# Patient Record
Sex: Male | Born: 1940 | Race: Black or African American | Hispanic: No | Smoking: Never smoker
Health system: Southern US, Community
[De-identification: ages and names within clinical notes are randomized; demographics above are authoritative.]

## PROBLEM LIST (undated history)

## (undated) DIAGNOSIS — M109 Gout, unspecified: Secondary | ICD-10-CM

## (undated) DIAGNOSIS — N400 Enlarged prostate without lower urinary tract symptoms: Secondary | ICD-10-CM

## (undated) DIAGNOSIS — I428 Other cardiomyopathies: Secondary | ICD-10-CM

## (undated) DIAGNOSIS — Z803 Family history of malignant neoplasm of breast: Secondary | ICD-10-CM

## (undated) DIAGNOSIS — I480 Paroxysmal atrial fibrillation: Secondary | ICD-10-CM

## (undated) DIAGNOSIS — E785 Hyperlipidemia, unspecified: Secondary | ICD-10-CM

## (undated) DIAGNOSIS — Z8042 Family history of malignant neoplasm of prostate: Secondary | ICD-10-CM

## (undated) DIAGNOSIS — E119 Type 2 diabetes mellitus without complications: Secondary | ICD-10-CM

## (undated) DIAGNOSIS — I509 Heart failure, unspecified: Secondary | ICD-10-CM

## (undated) DIAGNOSIS — I10 Essential (primary) hypertension: Secondary | ICD-10-CM

## (undated) DIAGNOSIS — C61 Malignant neoplasm of prostate: Secondary | ICD-10-CM

## (undated) HISTORY — DX: Heart failure, unspecified: I50.9

## (undated) HISTORY — DX: Family history of malignant neoplasm of breast: Z80.3

## (undated) HISTORY — DX: Family history of malignant neoplasm of prostate: Z80.42

## (undated) HISTORY — DX: Hyperlipidemia, unspecified: E78.5

## (undated) HISTORY — DX: Benign prostatic hyperplasia without lower urinary tract symptoms: N40.0

## (undated) HISTORY — DX: Paroxysmal atrial fibrillation: I48.0

## (undated) HISTORY — DX: Gout, unspecified: M10.9

## (undated) HISTORY — DX: Other cardiomyopathies: I42.8

## (undated) HISTORY — DX: Essential (primary) hypertension: I10

## (undated) HISTORY — DX: Type 2 diabetes mellitus without complications: E11.9

---

## 2012-03-17 DIAGNOSIS — Z125 Encounter for screening for malignant neoplasm of prostate: Secondary | ICD-10-CM | POA: Diagnosis not present

## 2012-03-17 DIAGNOSIS — Z713 Dietary counseling and surveillance: Secondary | ICD-10-CM | POA: Diagnosis not present

## 2012-03-17 DIAGNOSIS — E785 Hyperlipidemia, unspecified: Secondary | ICD-10-CM | POA: Diagnosis not present

## 2012-03-17 DIAGNOSIS — I509 Heart failure, unspecified: Secondary | ICD-10-CM | POA: Diagnosis not present

## 2012-03-17 DIAGNOSIS — E119 Type 2 diabetes mellitus without complications: Secondary | ICD-10-CM | POA: Diagnosis not present

## 2012-03-17 DIAGNOSIS — I10 Essential (primary) hypertension: Secondary | ICD-10-CM | POA: Diagnosis not present

## 2012-05-23 DIAGNOSIS — I509 Heart failure, unspecified: Secondary | ICD-10-CM | POA: Diagnosis not present

## 2012-05-30 DIAGNOSIS — I509 Heart failure, unspecified: Secondary | ICD-10-CM | POA: Diagnosis not present

## 2012-06-29 HISTORY — PX: CARDIAC DEFIBRILLATOR PLACEMENT: SHX171

## 2012-07-04 DIAGNOSIS — I2510 Atherosclerotic heart disease of native coronary artery without angina pectoris: Secondary | ICD-10-CM | POA: Diagnosis not present

## 2012-07-04 DIAGNOSIS — I428 Other cardiomyopathies: Secondary | ICD-10-CM | POA: Diagnosis not present

## 2012-07-07 DIAGNOSIS — I509 Heart failure, unspecified: Secondary | ICD-10-CM | POA: Diagnosis not present

## 2012-07-07 DIAGNOSIS — Z713 Dietary counseling and surveillance: Secondary | ICD-10-CM | POA: Diagnosis not present

## 2012-07-07 DIAGNOSIS — I10 Essential (primary) hypertension: Secondary | ICD-10-CM | POA: Diagnosis not present

## 2012-07-07 DIAGNOSIS — M2550 Pain in unspecified joint: Secondary | ICD-10-CM | POA: Diagnosis not present

## 2012-07-07 DIAGNOSIS — E785 Hyperlipidemia, unspecified: Secondary | ICD-10-CM | POA: Diagnosis not present

## 2012-07-07 DIAGNOSIS — E119 Type 2 diabetes mellitus without complications: Secondary | ICD-10-CM | POA: Diagnosis not present

## 2012-07-15 DIAGNOSIS — I428 Other cardiomyopathies: Secondary | ICD-10-CM | POA: Diagnosis not present

## 2012-07-15 DIAGNOSIS — M129 Arthropathy, unspecified: Secondary | ICD-10-CM | POA: Diagnosis not present

## 2012-07-15 DIAGNOSIS — Z8249 Family history of ischemic heart disease and other diseases of the circulatory system: Secondary | ICD-10-CM | POA: Diagnosis not present

## 2012-07-15 DIAGNOSIS — I10 Essential (primary) hypertension: Secondary | ICD-10-CM | POA: Diagnosis not present

## 2012-07-15 DIAGNOSIS — I498 Other specified cardiac arrhythmias: Secondary | ICD-10-CM | POA: Diagnosis not present

## 2012-07-15 DIAGNOSIS — I2510 Atherosclerotic heart disease of native coronary artery without angina pectoris: Secondary | ICD-10-CM | POA: Diagnosis not present

## 2012-07-15 DIAGNOSIS — E7800 Pure hypercholesterolemia, unspecified: Secondary | ICD-10-CM | POA: Diagnosis not present

## 2012-07-15 DIAGNOSIS — E785 Hyperlipidemia, unspecified: Secondary | ICD-10-CM | POA: Diagnosis not present

## 2012-07-15 DIAGNOSIS — I509 Heart failure, unspecified: Secondary | ICD-10-CM | POA: Diagnosis not present

## 2012-07-15 DIAGNOSIS — Z79899 Other long term (current) drug therapy: Secondary | ICD-10-CM | POA: Diagnosis not present

## 2012-07-15 DIAGNOSIS — E119 Type 2 diabetes mellitus without complications: Secondary | ICD-10-CM | POA: Diagnosis not present

## 2012-07-15 DIAGNOSIS — I447 Left bundle-branch block, unspecified: Secondary | ICD-10-CM | POA: Diagnosis not present

## 2012-07-15 DIAGNOSIS — Z7982 Long term (current) use of aspirin: Secondary | ICD-10-CM | POA: Diagnosis not present

## 2012-07-15 DIAGNOSIS — Z833 Family history of diabetes mellitus: Secondary | ICD-10-CM | POA: Diagnosis not present

## 2012-07-16 DIAGNOSIS — I428 Other cardiomyopathies: Secondary | ICD-10-CM | POA: Diagnosis not present

## 2012-07-16 DIAGNOSIS — I447 Left bundle-branch block, unspecified: Secondary | ICD-10-CM | POA: Diagnosis not present

## 2012-07-16 DIAGNOSIS — E785 Hyperlipidemia, unspecified: Secondary | ICD-10-CM | POA: Diagnosis not present

## 2012-07-16 DIAGNOSIS — E119 Type 2 diabetes mellitus without complications: Secondary | ICD-10-CM | POA: Diagnosis not present

## 2012-07-16 DIAGNOSIS — I10 Essential (primary) hypertension: Secondary | ICD-10-CM | POA: Diagnosis not present

## 2012-07-16 DIAGNOSIS — I509 Heart failure, unspecified: Secondary | ICD-10-CM | POA: Diagnosis not present

## 2012-07-20 DIAGNOSIS — I509 Heart failure, unspecified: Secondary | ICD-10-CM | POA: Diagnosis not present

## 2012-07-27 DIAGNOSIS — I428 Other cardiomyopathies: Secondary | ICD-10-CM | POA: Diagnosis not present

## 2012-07-28 DIAGNOSIS — I428 Other cardiomyopathies: Secondary | ICD-10-CM | POA: Diagnosis not present

## 2012-07-28 DIAGNOSIS — I2510 Atherosclerotic heart disease of native coronary artery without angina pectoris: Secondary | ICD-10-CM | POA: Diagnosis not present

## 2012-07-28 DIAGNOSIS — E785 Hyperlipidemia, unspecified: Secondary | ICD-10-CM | POA: Diagnosis not present

## 2012-07-28 DIAGNOSIS — I509 Heart failure, unspecified: Secondary | ICD-10-CM | POA: Diagnosis not present

## 2012-08-04 DIAGNOSIS — I10 Essential (primary) hypertension: Secondary | ICD-10-CM | POA: Diagnosis not present

## 2012-08-04 DIAGNOSIS — Z95810 Presence of automatic (implantable) cardiac defibrillator: Secondary | ICD-10-CM | POA: Diagnosis not present

## 2012-08-04 DIAGNOSIS — I509 Heart failure, unspecified: Secondary | ICD-10-CM | POA: Diagnosis not present

## 2012-08-04 DIAGNOSIS — E785 Hyperlipidemia, unspecified: Secondary | ICD-10-CM | POA: Diagnosis not present

## 2012-08-08 DIAGNOSIS — E785 Hyperlipidemia, unspecified: Secondary | ICD-10-CM | POA: Diagnosis not present

## 2012-08-08 DIAGNOSIS — I2510 Atherosclerotic heart disease of native coronary artery without angina pectoris: Secondary | ICD-10-CM | POA: Diagnosis not present

## 2012-08-08 DIAGNOSIS — I509 Heart failure, unspecified: Secondary | ICD-10-CM | POA: Diagnosis not present

## 2012-08-08 DIAGNOSIS — I428 Other cardiomyopathies: Secondary | ICD-10-CM | POA: Diagnosis not present

## 2012-08-26 DIAGNOSIS — I428 Other cardiomyopathies: Secondary | ICD-10-CM | POA: Diagnosis not present

## 2012-09-09 DIAGNOSIS — G4730 Sleep apnea, unspecified: Secondary | ICD-10-CM | POA: Diagnosis not present

## 2012-09-09 DIAGNOSIS — I10 Essential (primary) hypertension: Secondary | ICD-10-CM | POA: Diagnosis not present

## 2012-09-09 DIAGNOSIS — E785 Hyperlipidemia, unspecified: Secondary | ICD-10-CM | POA: Diagnosis not present

## 2012-09-09 DIAGNOSIS — I509 Heart failure, unspecified: Secondary | ICD-10-CM | POA: Diagnosis not present

## 2013-04-26 ENCOUNTER — Ambulatory Visit (INDEPENDENT_AMBULATORY_CARE_PROVIDER_SITE_OTHER): Payer: Medicare Other | Admitting: Physician Assistant

## 2013-04-26 ENCOUNTER — Encounter: Payer: Self-pay | Admitting: Physician Assistant

## 2013-04-26 VITALS — BP 106/57 | HR 75 | Ht 70.0 in | Wt 248.0 lb

## 2013-04-26 DIAGNOSIS — R0602 Shortness of breath: Secondary | ICD-10-CM

## 2013-04-26 DIAGNOSIS — M109 Gout, unspecified: Secondary | ICD-10-CM

## 2013-04-26 DIAGNOSIS — I10 Essential (primary) hypertension: Secondary | ICD-10-CM

## 2013-04-26 DIAGNOSIS — I509 Heart failure, unspecified: Secondary | ICD-10-CM | POA: Diagnosis not present

## 2013-04-26 DIAGNOSIS — N400 Enlarged prostate without lower urinary tract symptoms: Secondary | ICD-10-CM

## 2013-04-26 DIAGNOSIS — E119 Type 2 diabetes mellitus without complications: Secondary | ICD-10-CM | POA: Diagnosis not present

## 2013-04-26 LAB — COMPLETE METABOLIC PANEL WITH GFR
AST: 26 U/L (ref 0–37)
Alkaline Phosphatase: 56 U/L (ref 39–117)
BUN: 16 mg/dL (ref 6–23)
Creat: 1.15 mg/dL (ref 0.50–1.35)
GFR, Est Non African American: 63 mL/min
Potassium: 4.7 mEq/L (ref 3.5–5.3)
Total Bilirubin: 0.7 mg/dL (ref 0.3–1.2)

## 2013-04-26 LAB — BRAIN NATRIURETIC PEPTIDE: Brain Natriuretic Peptide: 333.9 pg/mL — ABNORMAL HIGH (ref 0.0–100.0)

## 2013-04-26 MED ORDER — METFORMIN HCL ER (MOD) 500 MG PO TB24: 500.0000 mg | ORAL_TABLET | Freq: Every day | ORAL | Status: DC

## 2013-04-26 MED ORDER — SPIRONOLACTONE 50 MG PO TABS: 50.0000 mg | ORAL_TABLET | Freq: Every day | ORAL | Status: DC

## 2013-04-26 NOTE — Patient Instructions (Signed)
Will call once we get a copy of medications.

## 2013-04-28 DIAGNOSIS — E089 Diabetes mellitus due to underlying condition without complications: Secondary | ICD-10-CM | POA: Insufficient documentation

## 2013-04-28 DIAGNOSIS — N400 Enlarged prostate without lower urinary tract symptoms: Secondary | ICD-10-CM | POA: Insufficient documentation

## 2013-04-28 DIAGNOSIS — I10 Essential (primary) hypertension: Secondary | ICD-10-CM | POA: Insufficient documentation

## 2013-04-28 DIAGNOSIS — M109 Gout, unspecified: Secondary | ICD-10-CM | POA: Insufficient documentation

## 2013-04-28 MED ORDER — ALLOPURINOL 100 MG PO TABS: 100.0000 mg | ORAL_TABLET | Freq: Every day | ORAL | Status: DC

## 2013-04-28 MED ORDER — SIMVASTATIN 40 MG PO TABS: 40.0000 mg | ORAL_TABLET | Freq: Every evening | ORAL | Status: DC

## 2013-04-28 MED ORDER — LISINOPRIL 20 MG PO TABS: 20.0000 mg | ORAL_TABLET | Freq: Every day | ORAL | Status: DC

## 2013-04-28 MED ORDER — CARVEDILOL 25 MG PO TABS: 25.0000 mg | ORAL_TABLET | Freq: Two times a day (BID) | ORAL | Status: DC

## 2013-04-28 MED ORDER — TERAZOSIN HCL 2 MG PO CAPS: 2.0000 mg | ORAL_CAPSULE | Freq: Every day | ORAL | Status: DC

## 2013-04-28 MED ORDER — FUROSEMIDE 40 MG PO TABS: 40.0000 mg | ORAL_TABLET | Freq: Every day | ORAL | Status: DC

## 2013-04-28 NOTE — Progress Notes (Signed)
  Subjective:    Patient ID: Earl Real Sr., male    DOB: October 07, 1940, 72 y.o.   MRN: [id number]  HPI Patient is a 72 year old male who presents to the clinic to establish care. Patient does have a past medical history for congestive heart failure, hypertension, diabetes mellitus, BPH, and gout. When presenting to the clinic patient did not have any of this information. He did not bring in his medications and we were not able to figure out his conditions. Patient does deny ever smoking. He does drink alcohol occasionally. He is not currently active.  For the last 2 weeks he has been more short of breath. He notices that the things he used to do he becomes short of breath with doing. He has recently ran out of one of his medications which he does not know the name for it. Once he ran out of that medication is when his shortness of breath started. He denies any fever, chills, cough, nausea, vomiting, wheezing. patient does report that he is not on any inhalers. Patient does not feel like he is swelling any more or less than he is used too. He continues to urinate regularly.   Review of Systems  All other systems reviewed and are negative.       Objective:   Physical Exam  Constitutional: He is oriented to person, place, and time. He appears well-developed and well-nourished.  HENT:  Head: Normocephalic and atraumatic.  Neck: Normal range of motion. Neck supple. No JVD present.  Cardiovascular: Normal rate, regular rhythm and normal heart sounds.   Pulmonary/Chest: Effort normal and breath sounds normal. He has no wheezes. He has no rales.  Neurological: He is alert and oriented to person, place, and time.  Skin: Skin is warm and dry.  NO swelling around feet/ankles/legs or hands.   Psychiatric: He has a normal mood and affect. His behavior is normal.          Assessment & Plan:  CHF- I not completely aware that the class is right. Patient would like to be referred to cardiology. We  have not yet received records from previous doctor. We did not have med list at visit today. After receiving med list I do see that patient is on spironactlone and furseimide. spironactolone was the medication that was stopped and has since had shortness of breath. I did send in for refill on Spironactolone I  want to see if that helped with shortness of breath. BNP was ordered today and was elevated at 333. I did not hear fluid around his lungs were see any signs of excess fluid in extremities. His weight was up from what he says is normal. I do not have anything to compare this to. Would like for patient to followup in 4 weeks' worsening or if shortness of breath does not resolve or suddenly worsens.  BPH-patient did not discuss the visit today found that he had BPH from [place]. Will followup at next visit.  DM type II- will f/u in 4 weeks. Not aware of that visit. We'll refill if patient needs it. Patient instructed once again med list to followup with appointment to discuss ongoing medical conditions.  Gout- will refill medications accordingly.

## 2013-05-10 ENCOUNTER — Ambulatory Visit (INDEPENDENT_AMBULATORY_CARE_PROVIDER_SITE_OTHER): Payer: Medicare Other | Admitting: Cardiology

## 2013-05-10 ENCOUNTER — Encounter: Payer: Self-pay | Admitting: Cardiology

## 2013-05-10 VITALS — BP 118/70 | HR 75 | Wt 248.0 lb

## 2013-05-10 DIAGNOSIS — I509 Heart failure, unspecified: Secondary | ICD-10-CM

## 2013-05-10 DIAGNOSIS — I420 Dilated cardiomyopathy: Secondary | ICD-10-CM | POA: Insufficient documentation

## 2013-05-10 DIAGNOSIS — I429 Cardiomyopathy, unspecified: Secondary | ICD-10-CM

## 2013-05-10 DIAGNOSIS — I428 Other cardiomyopathies: Secondary | ICD-10-CM | POA: Diagnosis not present

## 2013-05-10 DIAGNOSIS — Z95810 Presence of automatic (implantable) cardiac defibrillator: Secondary | ICD-10-CM | POA: Insufficient documentation

## 2013-05-10 MED ORDER — FUROSEMIDE 40 MG PO TABS: 40.0000 mg | ORAL_TABLET | Freq: Every day | ORAL | Status: DC

## 2013-05-10 NOTE — Assessment & Plan Note (Signed)
Patient is now euvolemic on examination. Continue present dose of diuretics. I have discussed low-sodium diet. He will weigh himself daily and take an additional 40 mg of Lasix when necessary weight gain of 2-3 pounds. Check potassium and renal function.

## 2013-05-10 NOTE — Assessment & Plan Note (Signed)
Plan referral to electrophysiology for long-term followup of his ICD.

## 2013-05-10 NOTE — Assessment & Plan Note (Signed)
Continue ACE inhibitor and beta blocker. Repeat echocardiogram. Obtain records from previous cardiologist for review.

## 2013-05-10 NOTE — Patient Instructions (Signed)
Your physician recommends that you schedule a follow-up appointment in: 3 months on Wednesday, February 25th @ 3:35pm  Your physician has requested that you have an echocardiogram. DX: CHF Echocardiography is a painless test that uses sound waves to create images of your heart. It provides your doctor with information about the size and shape of your heart and how well your heart's chambers and valves are working. This procedure takes approximately one hour. There are no restrictions for this procedure.   Your physician recommends that you have lab work today/bmet  Take extra dose of Lasix if weight is 2-3 lbs in 24 hours

## 2013-05-10 NOTE — Progress Notes (Signed)
HPI: 72 year old male for evaluation of congestive heart failure. Patient previously resided in [place] and then [place]. He apparently was diagnosed with a cardiomyopathy approximately 10 years ago. He does not know the etiology and denies catheterization. He has had previous CRT-D. He moved here in March of 2014 and was doing well symptomatically. However approximately 3 weeks ago he began to notice increasing dyspnea on exertion, orthopnea and weight gain. He realized that he had stopped taking his spironolactone. He saw a primary care in this medication was resumed and his symptoms have now improved and he is back to baseline. He denies dyspnea on exertion, orthopnea, PND, pedal edema, palpitations, syncope or chest pain.  Current Outpatient Prescriptions  Medication Sig Dispense Refill  . allopurinol (ZYLOPRIM) 100 MG tablet Take 1 tablet (100 mg total) by mouth daily.  30 tablet  2  . aspirin 81 MG tablet Take 81 mg by mouth daily.      . carvedilol (COREG) 25 MG tablet Take 1 tablet (25 mg total) by mouth 2 (two) times daily with a meal.  60 tablet  3  . furosemide (LASIX) 40 MG tablet Take 1 tablet (40 mg total) by mouth daily.  30 tablet  3  . lisinopril (PRINIVIL,ZESTRIL) 20 MG tablet Take 1 tablet (20 mg total) by mouth daily.  30 tablet  3  . metFORMIN (GLUMETZA) 500 MG (MOD) 24 hr tablet Take 1 tablet (500 mg total) by mouth daily with breakfast.  30 tablet  6  . simvastatin (ZOCOR) 40 MG tablet Take 1 tablet (40 mg total) by mouth every evening.  30 tablet  3  . spironolactone (ALDACTONE) 50 MG tablet Take 1 tablet (50 mg total) by mouth daily.  30 tablet  6  . terazosin (HYTRIN) 2 MG capsule Take 1 capsule (2 mg total) by mouth at bedtime.  90 capsule  0   No current facility-administered medications for this visit.    No Known Allergies  Past Medical History  Diagnosis Date  . CHF (congestive heart failure)   . Hypertension   . Diabetes mellitus     Diet  controlled  . Hyperlipidemia   . ICD (implantable cardioverter-defibrillator) in place     Past Surgical History  Procedure Laterality Date  . Cardiac defibrillator placement  Jan 2014    History   Social History  . Marital Status: Married    Spouse Name: N/A    Number of Children: 1  . Years of Education: N/A   Occupational History  . Not on file.   Social History Main Topics  . Smoking status: Never Smoker   . Smokeless tobacco: Not on file  . Alcohol Use: Yes     Comment: Occasional  . Drug Use: No  . Sexual Activity: Yes   Other Topics Concern  . Not on file   Social History Narrative  . No narrative on file    Family History  Problem Relation Age of Onset  . Diabetes Mother   . Hyperlipidemia Mother   . Diabetes Brother     ROS: no fevers or chills, productive cough, hemoptysis, dysphasia, odynophagia, melena, hematochezia, dysuria, hematuria, rash, seizure activity, orthopnea, PND, pedal edema, claudication. Remaining systems are negative.  Physical Exam:   Blood pressure 118/70, pulse 75, weight 248 lb (112.492 kg).  General:  Well developed/well nourished in NAD Skin warm/dry Patient not depressed No peripheral clubbing Back-normal HEENT-normal/normal eyelids Neck supple/normal carotid upstroke bilaterally; no bruits; no  JVD; no thyromegaly chest - CTA/ normal expansion, ICD left chest CV - RRR/normal S1 and S2; no murmurs, rubs or gallops;  PMI nondisplaced Abdomen -NT/ND, no HSM, no mass, + bowel sounds, no bruit 2+ femoral pulses, no bruits Ext-no edema, chords, 2+ DP Neuro-grossly nonfocal  ECG AV paced

## 2013-05-10 NOTE — Assessment & Plan Note (Signed)
Continue present blood pressure medications. 

## 2013-05-11 LAB — BASIC METABOLIC PANEL
BUN: 29 mg/dL — ABNORMAL HIGH (ref 6–23)
Creat: 1.69 mg/dL — ABNORMAL HIGH (ref 0.50–1.35)
Glucose, Bld: 101 mg/dL — ABNORMAL HIGH (ref 70–99)
Potassium: 4.3 mEq/L (ref 3.5–5.3)

## 2013-05-12 ENCOUNTER — Encounter: Payer: Self-pay | Admitting: *Deleted

## 2013-05-12 ENCOUNTER — Other Ambulatory Visit: Payer: Self-pay | Admitting: *Deleted

## 2013-05-12 DIAGNOSIS — N289 Disorder of kidney and ureter, unspecified: Secondary | ICD-10-CM

## 2013-05-12 MED ORDER — SPIRONOLACTONE 25 MG PO TABS: 25.0000 mg | ORAL_TABLET | Freq: Every day | ORAL | Status: DC

## 2013-05-24 ENCOUNTER — Encounter: Payer: PRIVATE HEALTH INSURANCE | Admitting: Internal Medicine

## 2013-05-24 ENCOUNTER — Encounter: Payer: Self-pay | Admitting: Internal Medicine

## 2013-05-24 ENCOUNTER — Ambulatory Visit (HOSPITAL_COMMUNITY): Payer: Medicare Other

## 2013-06-14 ENCOUNTER — Ambulatory Visit (INDEPENDENT_AMBULATORY_CARE_PROVIDER_SITE_OTHER): Payer: Medicare Other | Admitting: Physician Assistant

## 2013-06-14 ENCOUNTER — Encounter: Payer: Self-pay | Admitting: Physician Assistant

## 2013-06-14 VITALS — BP 98/50 | HR 75 | Wt 236.0 lb

## 2013-06-14 DIAGNOSIS — I10 Essential (primary) hypertension: Secondary | ICD-10-CM

## 2013-06-14 DIAGNOSIS — I509 Heart failure, unspecified: Secondary | ICD-10-CM | POA: Diagnosis not present

## 2013-06-14 DIAGNOSIS — M25649 Stiffness of unspecified hand, not elsewhere classified: Secondary | ICD-10-CM

## 2013-06-14 DIAGNOSIS — R7989 Other specified abnormal findings of blood chemistry: Secondary | ICD-10-CM

## 2013-06-14 DIAGNOSIS — N400 Enlarged prostate without lower urinary tract symptoms: Secondary | ICD-10-CM

## 2013-06-14 DIAGNOSIS — M109 Gout, unspecified: Secondary | ICD-10-CM

## 2013-06-14 DIAGNOSIS — R799 Abnormal finding of blood chemistry, unspecified: Secondary | ICD-10-CM

## 2013-06-14 DIAGNOSIS — E119 Type 2 diabetes mellitus without complications: Secondary | ICD-10-CM | POA: Diagnosis not present

## 2013-06-14 DIAGNOSIS — Z79899 Other long term (current) drug therapy: Secondary | ICD-10-CM | POA: Diagnosis not present

## 2013-06-14 DIAGNOSIS — N529 Male erectile dysfunction, unspecified: Secondary | ICD-10-CM

## 2013-06-14 LAB — POCT GLYCOSYLATED HEMOGLOBIN (HGB A1C): Hemoglobin A1C: 7.3

## 2013-06-14 MED ORDER — AMBULATORY NON FORMULARY MEDICATION: Status: DC

## 2013-06-14 MED ORDER — ALLOPURINOL 100 MG PO TABS: 100.0000 mg | ORAL_TABLET | Freq: Every day | ORAL | Status: DC

## 2013-06-14 NOTE — Progress Notes (Signed)
   Subjective:    Patient ID: Earl White, male    DOB: 02-Jan-1941, 72 y.o.   MRN: [id number]  HPI Patient presents to the clinic for medication refills and to follow up on diabetes and other ongoing medical conditions.   DM- Not checking sugars. Denies any hypoglycemic events. Does stay very active. Does not keep to a diabetic diet. Taking glumetza everyday. No neuropathy, vision changes, excessive thirst or fatigue.   Gout- taking allopurinol regularly with no recent exacerbations.  chF-after starting back on spironactlone doing much better and not SOB. Seen by dr. Jens Som. Using lasix for weight changes.   BPH- needs refill and PSA rechecked.    Pt has been having some morning stiffness of hands. He can not even make a fist in the morning but then resolves in 30 minutes or so. No other pain. Not done anything to make better. Nothing makes worse.   Pt would like to be on something for ED. Per pt he has been on viagra in the past but not recently.    Review of Systems     Objective:   Physical Exam  Constitutional: He is oriented to person, place, and time. He appears well-developed and well-nourished.  HENT:  Head: Normocephalic and atraumatic.  Neck: Normal range of motion. Neck supple. No JVD present.  Cardiovascular: Normal rate, regular rhythm and normal heart sounds.   Pulmonary/Chest: Effort normal and breath sounds normal.  Musculoskeletal:  No joint swelling or tenderness of hands. Hand grip 5/5. ROM normal and without pain.- bilateral hands.   Neurological: He is alert and oriented to person, place, and time.  Skin: Skin is warm and dry.  Psychiatric: He has a normal mood and affect. His behavior is normal.          Assessment & Plan:  DM-   Lab Results  Component Value Date   HGBA1C 7.3 06/14/2013   Discussed with patient that goal is under 7. Pt is currently only on Glumetza. Pt is on statin and ACE. He wants to try diet and exercise changes before  adding another medication. Discussed trying onylza if willing and cannot get A1C under control with metformin alone. Follow up in 3 months.   CHF- managed by dr. Jens Som.  Gout- refilled allupurinol.   Elevated serum creatinine- will recheck BMP today.  BPH- refilled medication. Will check PSA today.    Bilateral hand stiffness in am- will check blood for any RF abnormalities. Could use PRN NSAIDs but will have to watch kidney function.   ED- discussed with patient that with a dx of cardiomyopathy and hypotension he is not a good candidate for ED medication.  Gave rx for zostavax.  Dicussed pneumonia shot would like more info.  Declined flu shot today.

## 2013-06-14 NOTE — Patient Instructions (Signed)

## 2013-06-15 LAB — PSA: PSA: 4.14 ng/mL — ABNORMAL HIGH (ref <=4.00)

## 2013-06-15 LAB — BASIC METABOLIC PANEL WITH GFR
BUN: 23 mg/dL (ref 6–23)
CO2: 25 mEq/L (ref 19–32)
Calcium: 9.9 mg/dL (ref 8.4–10.5)
Chloride: 105 mEq/L (ref 96–112)
Creat: 1.12 mg/dL (ref 0.50–1.35)
GFR, Est African American: 75 mL/min
Glucose, Bld: 105 mg/dL — ABNORMAL HIGH (ref 70–99)
Sodium: 136 mEq/L (ref 135–145)

## 2013-06-15 LAB — RHEUMATOID FACTOR: Rhuematoid fact SerPl-aCnc: <10 IU/mL (ref <=14)

## 2013-06-15 LAB — CYCLIC CITRUL PEPTIDE ANTIBODY, IGG: Cyclic Citrullin Peptide Ab: <2 U/mL (ref 0.0–5.0)

## 2013-06-28 ENCOUNTER — Encounter: Payer: PRIVATE HEALTH INSURANCE | Admitting: Internal Medicine

## 2013-06-28 ENCOUNTER — Other Ambulatory Visit (HOSPITAL_COMMUNITY): Payer: Medicare Other

## 2013-07-03 ENCOUNTER — Ambulatory Visit (HOSPITAL_COMMUNITY): Payer: Medicare Other | Attending: Internal Medicine | Admitting: Radiology

## 2013-07-03 ENCOUNTER — Other Ambulatory Visit (HOSPITAL_COMMUNITY): Payer: Self-pay | Admitting: Cardiology

## 2013-07-03 ENCOUNTER — Encounter: Payer: Self-pay | Admitting: Cardiovascular Disease

## 2013-07-03 ENCOUNTER — Other Ambulatory Visit: Payer: Self-pay

## 2013-07-03 DIAGNOSIS — I509 Heart failure, unspecified: Secondary | ICD-10-CM | POA: Diagnosis not present

## 2013-07-03 DIAGNOSIS — I10 Essential (primary) hypertension: Secondary | ICD-10-CM | POA: Diagnosis not present

## 2013-07-03 DIAGNOSIS — E119 Type 2 diabetes mellitus without complications: Secondary | ICD-10-CM | POA: Diagnosis not present

## 2013-07-03 DIAGNOSIS — I379 Nonrheumatic pulmonary valve disorder, unspecified: Secondary | ICD-10-CM | POA: Diagnosis not present

## 2013-07-03 DIAGNOSIS — I079 Rheumatic tricuspid valve disease, unspecified: Secondary | ICD-10-CM | POA: Insufficient documentation

## 2013-07-03 DIAGNOSIS — I428 Other cardiomyopathies: Secondary | ICD-10-CM | POA: Insufficient documentation

## 2013-07-03 NOTE — Progress Notes (Signed)
Echocardiogram performed.  

## 2013-07-19 ENCOUNTER — Encounter: Payer: Self-pay | Admitting: Internal Medicine

## 2013-07-19 ENCOUNTER — Ambulatory Visit (INDEPENDENT_AMBULATORY_CARE_PROVIDER_SITE_OTHER): Payer: Medicare Other | Admitting: Internal Medicine

## 2013-07-19 VITALS — BP 118/72 | HR 76 | Ht 70.0 in | Wt 240.0 lb

## 2013-07-19 DIAGNOSIS — I10 Essential (primary) hypertension: Secondary | ICD-10-CM | POA: Diagnosis not present

## 2013-07-19 DIAGNOSIS — I428 Other cardiomyopathies: Secondary | ICD-10-CM | POA: Diagnosis not present

## 2013-07-19 DIAGNOSIS — I509 Heart failure, unspecified: Secondary | ICD-10-CM

## 2013-07-19 DIAGNOSIS — I420 Dilated cardiomyopathy: Secondary | ICD-10-CM

## 2013-07-19 DIAGNOSIS — Z95810 Presence of automatic (implantable) cardiac defibrillator: Secondary | ICD-10-CM

## 2013-07-19 LAB — MDC_IDC_ENUM_SESS_TYPE_INCLINIC
Battery Remaining Longevity: 72 mo
Brady Statistic RA Percent Paced: 99.11 %
Brady Statistic RV Percent Paced: 99.52 %
Date Time Interrogation Session: 20150121203813
HIGH POWER IMPEDANCE MEASURED VALUE: 49.5395
Lead Channel Impedance Value: 475 Ohm
Lead Channel Impedance Value: 525 Ohm
Lead Channel Impedance Value: 887.5 Ohm
Lead Channel Pacing Threshold Amplitude: 0.75 V
Lead Channel Pacing Threshold Pulse Width: 0.5 ms
Lead Channel Pacing Threshold Pulse Width: 0.5 ms
Lead Channel Pacing Threshold Pulse Width: 0.5 ms
Lead Channel Sensing Intrinsic Amplitude: 5 mV
Lead Channel Setting Pacing Amplitude: 2 V
Lead Channel Setting Pacing Pulse Width: 0.5 ms
Lead Channel Setting Pacing Pulse Width: 0.5 ms
Lead Channel Setting Sensing Sensitivity: 0.5 mV
MDC IDC MSMT LEADCHNL LV PACING THRESHOLD AMPLITUDE: 0.625 V
MDC IDC MSMT LEADCHNL RV PACING THRESHOLD AMPLITUDE: 0.875 V
MDC IDC MSMT LEADCHNL RV SENSING INTR AMPL: 11.6 mV
MDC IDC PG SERIAL: 7070427
MDC IDC SET LEADCHNL LV PACING AMPLITUDE: 2 V
MDC IDC SET LEADCHNL RA PACING AMPLITUDE: 2.5 V
MDC IDC SET ZONE DETECTION INTERVAL: 400 ms
Zone Setting Detection Interval: 300 ms

## 2013-07-19 NOTE — Patient Instructions (Addendum)
Your physician wants you to follow-up in: 12 months with Dr Vallery Ridge will receive a reminder letter in the mail two months in advance. If you don't receive a letter, please call our office to schedule the follow-up appointment.   Remote monitoring is used to monitor your Pacemaker or ICD from home. This monitoring reduces the number of office visits required to check your device to one time per year. It allows [place] to keep an eye on the functioning of your device to ensure it is working properly. You are scheduled for a device check from home on 10/20/13. You may send your transmission at any time that day. If you have a wireless device, the transmission will be sent automatically. After your physician reviews your transmission, you will receive a postcard with your next transmission date.   Alvis Lemmings, RN will call you about the [facility] clinic

## 2013-07-19 NOTE — Progress Notes (Signed)
Earl Planas, PA-C: Primary Cardiologist:  Earl Shrewsbury Sr. is a 73 y.o. 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  He has moved to [facility] and recently established general cardiology care with Dr [name].    He reports being diagnosed with CHF 10 years ago. He does not know the etiology and denies catheterization.   He recently had SOB.  This resolved upon restarting his spironolactone.   Presently, he is doing well. Today, he  denies symptoms of palpitations, chest pain, shortness of breath, orthopnea, PND, lower extremity edema, dizziness, presyncope, syncope, or neurologic sequela.  The patientis tolerating medications without difficulties and is otherwise without complaint today.   Past Medical History  Diagnosis Date  . CHF (congestive heart failure)   . Hypertension   . Diabetes mellitus     Type II. Diet controlled  . Hyperlipidemia   . ICD (implantable cardioverter-defibrillator) in place   . BPH (benign prostatic hyperplasia)   . Gout   . SOB (shortness of breath)   . Lower extremity edema   . Nonischemic cardiomyopathy    Past Surgical History  Procedure Laterality Date  . Cardiac defibrillator placement  Jan 2014    SJM Quadra Assura implanted by Dr [name] in [place]    History   Social History  . Marital Status: Married    Spouse Name: N/A    Number of Children: 1  . Years of Education: N/A   Occupational History  . Not on file.   Social History Main Topics  . Smoking status: Never Smoker   . Smokeless tobacco: Not on file  . Alcohol Use: Yes     Comment: Occasional  . Drug Use: No  . Sexual Activity: Yes   Other Topics Concern  . Not on file   Social History Narrative   Previously lived in [place] before moving to [place].  He has recently relocated to [place].     Family History  Problem Relation Age of Onset  . Diabetes Mother   . Hyperlipidemia Mother   . Diabetes Brother     No Known Allergies  Current Outpatient Prescriptions  Medication Sig Dispense Refill  . allopurinol (ZYLOPRIM) 100 MG tablet Take 1 tablet (100 mg total) by mouth daily.  30 tablet  5  . aspirin 81 MG tablet Take 81 mg by mouth daily.      . carvedilol (COREG) 25 MG tablet Take 1 tablet (25 mg total) by mouth 2 (two) times daily with a meal.  60 tablet  3  . furosemide (LASIX) 40 MG tablet Take 1 tablet (40 mg total) by mouth daily. Take 1 extra tablet ( 40 mg) of weight gain 2-3 lbs in 24 hours  30 tablet  3  . lisinopril (PRINIVIL,ZESTRIL) 20 MG tablet Take 1 tablet (20 mg total) by mouth daily.  30 tablet  3  . metFORMIN (GLUMETZA) 500 MG (MOD) 24 hr tablet Take 1 tablet (500 mg total) by mouth daily with breakfast.  30 tablet  6  . sennosides-docusate sodium (SENOKOT-S) 8.6-50 MG tablet Take 1 tablet by mouth daily.      . simvastatin (ZOCOR) 40 MG tablet Take 1 tablet (40 mg total) by mouth every evening.  30 tablet  3  . spironolactone (ALDACTONE) 25 MG tablet Take 1 tablet (25 mg  total) by mouth daily.  30 tablet  12  . terazosin (HYTRIN) 2 MG capsule Take 1 capsule (2 mg total) by mouth at bedtime.  90 capsule  0   No current facility-administered medications for this visit.    ROS- all systems are reviewed and negative except as per HPI  Physical Exam: Filed Vitals:   07/19/13 1447  BP: 118/72  Pulse: 76  Height: 5\' 10"  (1.778 m)  Weight: 240 lb (108.863 kg)    GEN- The patient is well appearing, alert and oriented x 3 today.   Head- normocephalic, atraumatic Eyes-  Sclera clear, conjunctiva pink Ears- hearing intact Oropharynx- clear Neck- supple, no JVP Lymph- no cervical lymphadenopathy Lungs- Clear to ausculation bilaterally, normal work of breathing Chest- ICD pocket is well healed Heart- Regular rate and rhythm, laterally displaced  PMI GI- soft, NT, ND, + BS Extremities- no clubbing, cyanosis, or edema MS- no significant deformity or atrophy Skin- no rash or lesion Psych- euthymic mood, full affect Neuro- strength and sensation are intact  BiV ICD interrogation- reviewed in detail today,  See PACEART report ekg reveals sinus rhythm with BIV pacing Echo reviewed Dr [name] notes are reviewed  Assessment and Plan:  1. Chronic systolic dysfunction/ nonischemic CM euvolemic today Normal BIV ICD function See Pace Art report Today, I have adjusted LV timing from LV first by 20 msec to LV first by 40 msec based on QRS duration with pacing. We will enroll in the ICM device clinic for [place] monitoring  2. HTN Stable No change required today  Merlin Return in 1 year

## 2013-07-24 ENCOUNTER — Encounter: Payer: Self-pay | Admitting: *Deleted

## 2013-07-28 ENCOUNTER — Other Ambulatory Visit: Payer: Self-pay | Admitting: Physician Assistant

## 2013-07-28 DIAGNOSIS — R972 Elevated prostate specific antigen [PSA]: Secondary | ICD-10-CM

## 2013-07-28 DIAGNOSIS — N400 Enlarged prostate without lower urinary tract symptoms: Secondary | ICD-10-CM

## 2013-08-04 ENCOUNTER — Ambulatory Visit (INDEPENDENT_AMBULATORY_CARE_PROVIDER_SITE_OTHER): Payer: Medicare Other | Admitting: Physician Assistant

## 2013-08-04 ENCOUNTER — Encounter: Payer: Self-pay | Admitting: Physician Assistant

## 2013-08-04 VITALS — BP 95/53 | HR 75 | Wt 239.0 lb

## 2013-08-04 DIAGNOSIS — N289 Disorder of kidney and ureter, unspecified: Secondary | ICD-10-CM | POA: Diagnosis not present

## 2013-08-04 DIAGNOSIS — R031 Nonspecific low blood-pressure reading: Secondary | ICD-10-CM | POA: Diagnosis not present

## 2013-08-04 DIAGNOSIS — R972 Elevated prostate specific antigen [PSA]: Secondary | ICD-10-CM

## 2013-08-04 DIAGNOSIS — N400 Enlarged prostate without lower urinary tract symptoms: Secondary | ICD-10-CM

## 2013-08-04 NOTE — Progress Notes (Signed)
   Subjective:    Patient ID: Earl Hatcher Sr., male    DOB: [birth year], 73 y.o.   MRN: [id number]  HPI Pt presents to the clinic to discuss elevated PSA. Pt not aware he was on BPH medication. He also thought was for his heart. He does have some urinary frequency issues with occasional weak stream.   Pt's BP is low but denies any fatigue, SOB, dizziness. He feels great. Taking medications as directed.    Review of Systems     Objective:   Physical Exam  Constitutional: He is oriented to person, place, and time. He appears well-developed and well-nourished.  HENT:  Head: Normocephalic and atraumatic.  Neck: No JVD present.  Cardiovascular: Normal rate, regular rhythm and normal heart sounds.   Pulmonary/Chest: Effort normal and breath sounds normal. He has no wheezes.  Neurological: He is alert and oriented to person, place, and time.  Skin:  No swelling of lower extremities.   Psychiatric: He has a normal mood and affect. His behavior is normal.          Assessment & Plan:  Elevated PSA/BPH- PSA was slighting elevated over 4.  AUA was 9(moderate symptoms). Pt on medication for BPH. I just want pt to be elevated by urologist. Has appt for next week.   Low blood pressure reading- decrease spironactlone to 12.5mg  half of the 25mg .Last time pt went off spironolactone he became SOB and Started having CHF symptoms. Has follow up with cardiologist on the 23rd of this month for BP recheck.

## 2013-08-04 NOTE — Patient Instructions (Addendum)
[place], [place] [phone]   Follow up with urology for elevated PSA.   Cut spirolactone 1/2 tab once a day.

## 2013-08-06 MED ORDER — SPIRONOLACTONE 25 MG PO TABS: ORAL_TABLET | ORAL | Status: DC

## 2013-08-11 ENCOUNTER — Other Ambulatory Visit: Payer: Self-pay | Admitting: *Deleted

## 2013-08-11 MED ORDER — TERAZOSIN HCL 2 MG PO CAPS: 2.0000 mg | ORAL_CAPSULE | Freq: Every day | ORAL | Status: DC

## 2013-08-21 ENCOUNTER — Ambulatory Visit (INDEPENDENT_AMBULATORY_CARE_PROVIDER_SITE_OTHER): Payer: Medicare Other | Admitting: *Deleted

## 2013-08-21 DIAGNOSIS — R972 Elevated prostate specific antigen [PSA]: Secondary | ICD-10-CM | POA: Diagnosis not present

## 2013-08-21 DIAGNOSIS — I509 Heart failure, unspecified: Secondary | ICD-10-CM | POA: Diagnosis not present

## 2013-08-21 DIAGNOSIS — N529 Male erectile dysfunction, unspecified: Secondary | ICD-10-CM | POA: Diagnosis not present

## 2013-08-21 DIAGNOSIS — Z95810 Presence of automatic (implantable) cardiac defibrillator: Secondary | ICD-10-CM

## 2013-08-21 DIAGNOSIS — N400 Enlarged prostate without lower urinary tract symptoms: Secondary | ICD-10-CM | POA: Diagnosis not present

## 2013-08-21 NOTE — Progress Notes (Signed)
EPIC Encounter for ICM Monitoring  Patient Name: Earl Malkin Sr. is a 73 y.o. male Date: 08/21/2013 Primary Care Physican: [name], PA-C Primary Cardiologist: [name] Electrophysiologist: Allred Dry Weight: 133 lbs       In the past month, have you:  1. Gained more than 2 pounds in a day or more than 5 pounds in a week? Yes. Last week, weight went up about 2 lbs. The patient did take and extra lasix twice last week.  2. Had changes in your medications (with verification of current medications)? no  3. Had more shortness of breath than is usual for you? no  4. Limited your activity because of shortness of breath? no  5. Not been able to sleep because of shortness of breath? no  6. Had increased swelling in your feet or ankles? no  7. Had symptoms of dehydration (dizziness, dry mouth, increased thirst, decreased urine output) no  8. Had changes in sodium restriction? no  9. Been compliant with medication? Yes  ** The only other complaint for the patient is discomfort to his hand. This is mostly in the middle finger. He had discussed this with his PCP about a month ago. They were unsure of the cause per the patient, but he has been doing exercises with his hand, which seems to help. I advised this could be arthritis or carpal tunnel possibly, but to continue with hand exercises. I advised not to take NSAIDS, but that he could try tylenol at night is pain is worse in the mornings to see if this will help. He will follow back up with his PCP should this persist or worsen for him. **   ICM trend:   Follow-up plan: ICM clinic phone appointment: 09/21/13 (The patient has follow up on 08/23/13 with Dr. [name] and follow up in about 10 days with his PCP).   Copy of note sent to patient's primary care physician, primary cardiologist, and device following physician.  Alvis Lemmings, RN, BSN 08/21/2013 11:40 AM

## 2013-08-23 ENCOUNTER — Ambulatory Visit: Payer: PRIVATE HEALTH INSURANCE | Admitting: Cardiology

## 2013-09-06 ENCOUNTER — Encounter: Payer: Self-pay | Admitting: Cardiology

## 2013-09-06 ENCOUNTER — Ambulatory Visit (INDEPENDENT_AMBULATORY_CARE_PROVIDER_SITE_OTHER): Payer: Medicare Other | Admitting: Cardiology

## 2013-09-06 VITALS — BP 116/70 | HR 75 | Ht 70.0 in | Wt 246.0 lb

## 2013-09-06 DIAGNOSIS — I428 Other cardiomyopathies: Secondary | ICD-10-CM | POA: Diagnosis not present

## 2013-09-06 DIAGNOSIS — I509 Heart failure, unspecified: Secondary | ICD-10-CM | POA: Diagnosis not present

## 2013-09-06 DIAGNOSIS — I10 Essential (primary) hypertension: Secondary | ICD-10-CM | POA: Diagnosis not present

## 2013-09-06 DIAGNOSIS — Z95810 Presence of automatic (implantable) cardiac defibrillator: Secondary | ICD-10-CM

## 2013-09-06 DIAGNOSIS — I420 Dilated cardiomyopathy: Secondary | ICD-10-CM

## 2013-09-06 NOTE — Assessment & Plan Note (Signed)
Blood pressure controlled. Continue present medications. 

## 2013-09-06 NOTE — Assessment & Plan Note (Signed)
euvolemic on examination. Continue present dose of diuretics. Check potassium and renal function. 

## 2013-09-06 NOTE — Assessment & Plan Note (Signed)
Continue ACE inhibitor and beta blocker. Obtain records from previous cardiologist concerning previous evaluation.

## 2013-09-06 NOTE — Progress Notes (Signed)
HPI: FU congestive heart failure. Patient previously resided in [place] and then [place]. He apparently was diagnosed with a cardiomyopathy approximately 10 years ago. He does not know the etiology and denies catheterization. He has had previous CRT-D. He moved here in March of 2014. Echocardiogram in January of 2013 showed an ejection fraction of 20-25%, severe left ventricular hypertrophy and biatrial enlargement. Last seen November 2014. Since then, the patient denies any dyspnea on exertion, orthopnea, PND, pedal edema, palpitations, syncope or chest pain.    Current Outpatient Prescriptions  Medication Sig Dispense Refill  . allopurinol (ZYLOPRIM) 100 MG tablet Take 1 tablet (100 mg total) by mouth daily.  30 tablet  5  . aspirin 81 MG tablet Take 81 mg by mouth daily.      . carvedilol (COREG) 25 MG tablet Take 1 tablet (25 mg total) by mouth 2 (two) times daily with a meal.  60 tablet  3  . furosemide (LASIX) 40 MG tablet Take 1 tablet (40 mg total) by mouth daily. Take 1 extra tablet ( 40 mg) of weight gain 2-3 lbs in 24 hours  30 tablet  3  . lisinopril (PRINIVIL,ZESTRIL) 20 MG tablet Take 1 tablet (20 mg total) by mouth daily.  30 tablet  3  . metFORMIN (GLUMETZA) 500 MG (MOD) 24 hr tablet Take 1 tablet (500 mg total) by mouth daily with breakfast.  30 tablet  6  . sennosides-docusate sodium (SENOKOT-S) 8.6-50 MG tablet Take 1 tablet by mouth daily.      . simvastatin (ZOCOR) 40 MG tablet Take 1 tablet (40 mg total) by mouth every evening.  30 tablet  3  . spironolactone (ALDACTONE) 25 MG tablet Take 1/2 tablet daily.  30 tablet  12  . terazosin (HYTRIN) 2 MG capsule Take 1 capsule (2 mg total) by mouth at bedtime.  90 capsule  1   No current facility-administered medications for this visit.     Past Medical History  Diagnosis Date  . CHF (congestive heart failure)   . Hypertension   . Diabetes mellitus     Type II. Diet controlled  . Hyperlipidemia   .  ICD (implantable cardioverter-defibrillator) in place   . BPH (benign prostatic hyperplasia)   . Gout   . SOB (shortness of breath)   . Lower extremity edema   . Nonischemic cardiomyopathy     Past Surgical History  Procedure Laterality Date  . Cardiac defibrillator placement  Jan 2014    SJM Quadra Assura implanted by Dr [name] in [place]    History   Social History  . Marital Status: Married    Spouse Name: N/A    Number of Children: 1  . Years of Education: N/A   Occupational History  . Not on file.   Social History Main Topics  . Smoking status: Never Smoker   . Smokeless tobacco: Not on file  . Alcohol Use: Yes     Comment: Occasional  . Drug Use: No  . Sexual Activity: Yes   Other Topics Concern  . Not on file   Social History Narrative   Previously lived in [place] before moving to [place].  He has recently relocated to [place].    ROS: no fevers or chills, productive cough, hemoptysis, dysphasia, odynophagia, melena, hematochezia, dysuria, hematuria, rash, seizure activity, orthopnea, PND, pedal edema, claudication. Remaining systems are negative.  Physical Exam: Well-developed well-nourished in no acute distress.  Skin is warm and dry.  HEENT is normal.  Neck is supple.  Chest is clear to auscultation with normal expansion.  Cardiovascular exam is regular rate and rhythm.  Abdominal exam nontender or distended. No masses palpated. Extremities show no edema. neuro grossly intact  ECG Sinus rhythm, ventricular pacing.

## 2013-09-06 NOTE — Patient Instructions (Signed)
Your physician wants you to follow-up in: [place] will receive a reminder letter in the mail two months in advance. If you don't receive a letter, please call our office to schedule the follow-up appointment.   Your physician recommends that you HAVE LAB WORK TODAY

## 2013-09-06 NOTE — Assessment & Plan Note (Signed)
Management per electrophysiology. 

## 2013-09-07 DIAGNOSIS — R972 Elevated prostate specific antigen [PSA]: Secondary | ICD-10-CM | POA: Diagnosis not present

## 2013-09-07 LAB — BASIC METABOLIC PANEL WITH GFR
BUN: 35 mg/dL — ABNORMAL HIGH (ref 6–23)
CO2: 27 mEq/L (ref 19–32)
Calcium: 9.7 mg/dL (ref 8.4–10.5)
Chloride: 107 mEq/L (ref 96–112)
Creat: 1.42 mg/dL — ABNORMAL HIGH (ref 0.50–1.35)
GFR, Est African American: 57 mL/min — ABNORMAL LOW
GFR, Est Non African American: 49 mL/min — ABNORMAL LOW
GLUCOSE: 91 mg/dL (ref 70–99)
POTASSIUM: 4.9 meq/L (ref 3.5–5.3)
SODIUM: 141 meq/L (ref 135–145)

## 2013-09-08 ENCOUNTER — Telehealth: Payer: Self-pay | Admitting: *Deleted

## 2013-09-08 DIAGNOSIS — R7989 Other specified abnormal findings of blood chemistry: Secondary | ICD-10-CM

## 2013-09-08 NOTE — Telephone Encounter (Signed)
I spoke with pt about lab results. He will stop the spironolactone today & return to the [facility] office for bmp on or about March 31 as per Dr. [name]. Horton Chin RN

## 2013-09-08 NOTE — Telephone Encounter (Signed)
Message copied by Verna Czech on Fri Sep 08, 2013  9:39 AM ------      Message from: Lelon Perla      Created: Thu Sep 07, 2013  8:51 AM       DC spironolactone; bmet 2 weeks      Kirk Ruths       ------

## 2013-09-11 DIAGNOSIS — N400 Enlarged prostate without lower urinary tract symptoms: Secondary | ICD-10-CM | POA: Diagnosis not present

## 2013-09-21 ENCOUNTER — Encounter: Payer: Self-pay | Admitting: *Deleted

## 2013-09-21 ENCOUNTER — Ambulatory Visit (INDEPENDENT_AMBULATORY_CARE_PROVIDER_SITE_OTHER): Payer: Medicare Other | Admitting: *Deleted

## 2013-09-21 DIAGNOSIS — Z95810 Presence of automatic (implantable) cardiac defibrillator: Secondary | ICD-10-CM

## 2013-09-21 DIAGNOSIS — I509 Heart failure, unspecified: Secondary | ICD-10-CM | POA: Diagnosis not present

## 2013-09-21 NOTE — Progress Notes (Addendum)
EPIC Encounter for ICM Monitoring  Patient Name: Earl Vandunk Sr. is a 73 y.o. male Date: 09/21/2013 Primary Care Physican: [name], PA-C Primary Cardiologist: [name] Electrophysiologist: Allred Dry Weight: 233 lbs (Last month note stated 133 lbs, this was an error and should have stated 233 lbs).   Bi-V pacing: > 99%      In the past month, have you:  1. Gained more than 2 pounds in a day or more than 5 pounds in a week? Yes. The patient is now at 238 lbs. He was taken off of his spironolactone on 09/08/13 by Dr. [name] for a creatinine of 1.4 (up from 1.1 in December). He is due at the end of this week/ beginning of next week for a repeat bmp.   2. Had changes in your medications (with verification of current medications)? Yes. As noted above.  3. Had more shortness of breath than is usual for you? Yes. A small increase with climbing steps. He mostly notices that his energy level is down.   4. Limited your activity because of shortness of breath? no  5. Not been able to sleep because of shortness of breath? no  6. Had increased swelling in your feet or ankles? no  7. Had symptoms of dehydration (dizziness, dry mouth, increased thirst, decreased urine output) no  8. Had changes in sodium restriction? no  9. Been compliant with medication? Yes   ICM trend:   Follow-up plan: ICM clinic phone appointment: 10/20/13 (full transmission). The patient has continued with lasix 40 mg once daily. I have advised that since we are closely watching his creatinine now, that he may take an extra 1/2 tablet of lasix today and if his weights are still up in the morning, he may take another extra 1/2 tablet. I explained that we will be in touch with him after his repeat bmp to let him know what the plan will be with his spironolactone. He was only on 12.5 mg daily of spironolactone. I will forward to Dr. [name] to review ICM and weight trends.   Copy of note sent to patient's primary  care physician, primary cardiologist, and device following physician.  Alvis Lemmings, RN, BSN 09/21/2013 10:07 AM

## 2013-09-25 ENCOUNTER — Other Ambulatory Visit: Payer: Self-pay | Admitting: *Deleted

## 2013-09-25 ENCOUNTER — Telehealth: Payer: Self-pay | Admitting: Cardiology

## 2013-09-25 MED ORDER — LISINOPRIL 20 MG PO TABS: 20.0000 mg | ORAL_TABLET | Freq: Every day | ORAL | Status: DC

## 2013-09-25 NOTE — Telephone Encounter (Signed)
ROI faxed to [facility] [phone]

## 2013-10-20 ENCOUNTER — Ambulatory Visit (INDEPENDENT_AMBULATORY_CARE_PROVIDER_SITE_OTHER): Payer: Medicare Other | Admitting: *Deleted

## 2013-10-20 ENCOUNTER — Encounter: Payer: Self-pay | Admitting: Internal Medicine

## 2013-10-20 DIAGNOSIS — I509 Heart failure, unspecified: Secondary | ICD-10-CM | POA: Diagnosis not present

## 2013-10-20 DIAGNOSIS — Z95810 Presence of automatic (implantable) cardiac defibrillator: Secondary | ICD-10-CM | POA: Diagnosis not present

## 2013-10-20 LAB — MDC_IDC_ENUM_SESS_TYPE_REMOTE
Battery Voltage: 2.98 V
Brady Statistic AP VP Percent: 98 %
Brady Statistic AP VS Percent: 1 %
Brady Statistic AS VS Percent: 1 %
Brady Statistic RA Percent Paced: 99 %
HighPow Impedance: 45 Ohm
HighPow Impedance: 45 Ohm
Lead Channel Impedance Value: 430 Ohm
Lead Channel Impedance Value: 850 Ohm
Lead Channel Pacing Threshold Amplitude: 0.75 V
Lead Channel Pacing Threshold Amplitude: 0.875 V
Lead Channel Pacing Threshold Pulse Width: 0.5 ms
Lead Channel Sensing Intrinsic Amplitude: 11.6 mV
Lead Channel Setting Pacing Amplitude: 2 V
Lead Channel Setting Pacing Amplitude: 2 V
Lead Channel Setting Pacing Amplitude: 2.5 V
Lead Channel Setting Pacing Pulse Width: 0.5 ms
Lead Channel Setting Pacing Pulse Width: 0.5 ms
MDC IDC MSMT BATTERY REMAINING LONGEVITY: 67 mo
MDC IDC MSMT BATTERY REMAINING PERCENTAGE: 81 %
MDC IDC MSMT LEADCHNL RA IMPEDANCE VALUE: 480 Ohm
MDC IDC MSMT LEADCHNL RA PACING THRESHOLD AMPLITUDE: 0.75 V
MDC IDC MSMT LEADCHNL RA PACING THRESHOLD PULSEWIDTH: 0.5 ms
MDC IDC MSMT LEADCHNL RA SENSING INTR AMPL: 5 mV
MDC IDC MSMT LEADCHNL RV PACING THRESHOLD PULSEWIDTH: 0.5 ms
MDC IDC PG SERIAL: 7070427
MDC IDC SESS DTM: 20150424080025
MDC IDC SET LEADCHNL RV SENSING SENSITIVITY: 0.5 mV
MDC IDC SET ZONE DETECTION INTERVAL: 300 ms
MDC IDC SET ZONE DETECTION INTERVAL: 400 ms
MDC IDC STAT BRADY AS VP PERCENT: 1.3 %

## 2013-10-26 ENCOUNTER — Encounter: Payer: Self-pay | Admitting: *Deleted

## 2013-10-26 NOTE — Progress Notes (Signed)
EPIC Encounter for ICM Monitoring  Patient Name: Earl Carlisi Sr. is a 73 y.o. male Date: 10/26/2013 Primary Care Physican: [name], PA-C Primary Cardiologist: [name] Electrophysiologist: Allred Dry Weight: 238 lbs   Bi-V pacing: > 99%     In the past month, have you:  1. Gained more than 2 pounds in a day or more than 5 pounds in a week? Yes. The patient's baseline weight has also shifted from 233 lbs to 238 lbs. His spironolactone was stopped by Dr. [name] in March due to his slightly elevated renal function (3/11- creatinine 1.42)  2. Had changes in your medications (with verification of current medications)? Yes. The patient restarted his spironolactone at 12.5 mg daily on his own. He was due to have a repeat BMP, but has not had this done. He is due to go to the [facility] in [place] next week.   3. Had more shortness of breath than is usual for you? Yes. He mostly notices decreased energy and SOB with climbing stairs.   4. Limited your activity because of shortness of breath? no  5. Not been able to sleep because of shortness of breath? no  6. Had increased swelling in your feet or ankles? no  7. Had symptoms of dehydration (dizziness, dry mouth, increased thirst, decreased urine output) no  8. Had changes in sodium restriction? no  9. Been compliant with medication? Yes   ICM trend:   Follow-up plan: ICM clinic phone appointment: 11/27/13. The patient states he will have a repeat BMP in [place] next week. I will review his BMP and medications with Dr. [name] once received. The patient was agreeable.  Copy of note sent to patient's primary care physician, primary cardiologist, and device following physician.  Emily Filbert, RN, BSN 10/26/2013 1:42 PM

## 2013-10-31 NOTE — Progress Notes (Signed)
Remote CRT-D device check. Thresholds and sensing consistent with previous device measurements. Lead impedance trends stable over time. No mode switch episodes recorded. No ventricular arrhythmia episodes recorded. Patient bi-ventricularly pacing >100% of the time. Device programmed with appropriate safety margins. Heart failure diagnostics reviewed and trends are stable for patient.  Estimated longevity 5.6 years.  Next remote 01/22/14.

## 2013-11-16 ENCOUNTER — Encounter: Payer: Self-pay | Admitting: Cardiology

## 2013-11-22 ENCOUNTER — Ambulatory Visit: Payer: Medicare Other | Admitting: Physician Assistant

## 2013-11-22 DIAGNOSIS — Z0289 Encounter for other administrative examinations: Secondary | ICD-10-CM

## 2013-11-24 ENCOUNTER — Ambulatory Visit: Payer: Medicare Other | Admitting: Physician Assistant

## 2013-11-24 ENCOUNTER — Telehealth: Payer: Self-pay | Admitting: Physician Assistant

## 2013-11-24 ENCOUNTER — Ambulatory Visit (INDEPENDENT_AMBULATORY_CARE_PROVIDER_SITE_OTHER): Payer: Medicare Other | Admitting: Physician Assistant

## 2013-11-24 ENCOUNTER — Encounter: Payer: Self-pay | Admitting: Physician Assistant

## 2013-11-24 VITALS — BP 126/72 | HR 80 | Ht 70.0 in | Wt 246.0 lb

## 2013-11-24 DIAGNOSIS — N400 Enlarged prostate without lower urinary tract symptoms: Secondary | ICD-10-CM

## 2013-11-24 DIAGNOSIS — R972 Elevated prostate specific antigen [PSA]: Secondary | ICD-10-CM | POA: Insufficient documentation

## 2013-11-24 DIAGNOSIS — I509 Heart failure, unspecified: Secondary | ICD-10-CM | POA: Diagnosis not present

## 2013-11-24 DIAGNOSIS — Z79899 Other long term (current) drug therapy: Secondary | ICD-10-CM | POA: Diagnosis not present

## 2013-11-24 MED ORDER — SPIRONOLACTONE 12.5 MG HALF TABLET: 12.5000 mg | ORAL_TABLET | Freq: Every day | ORAL | Status: DC

## 2013-11-24 NOTE — Patient Instructions (Signed)
Will refer to another urologist.  Will get BMP.

## 2013-11-24 NOTE — Telephone Encounter (Signed)
Call pt: make aware needs repeat BMP to recheck.

## 2013-11-24 NOTE — Telephone Encounter (Signed)
Patient is aware and has an appointment today.

## 2013-11-25 LAB — BASIC METABOLIC PANEL WITH GFR
BUN: 19 mg/dL (ref 6–23)
CO2: 26 meq/L (ref 19–32)
Calcium: 10 mg/dL (ref 8.4–10.5)
Chloride: 107 mEq/L (ref 96–112)
Creat: 1.07 mg/dL (ref 0.50–1.35)
GFR, EST AFRICAN AMERICAN: 79 mL/min
GFR, Est Non African American: 68 mL/min
Glucose, Bld: 106 mg/dL — ABNORMAL HIGH (ref 70–99)
Potassium: 4.7 mEq/L (ref 3.5–5.3)
Sodium: 144 mEq/L (ref 135–145)

## 2013-11-25 LAB — PSA: PSA: 5.32 ng/mL — ABNORMAL HIGH (ref <=4.00)

## 2013-11-27 ENCOUNTER — Encounter: Payer: Medicare Other | Admitting: *Deleted

## 2013-11-27 ENCOUNTER — Telehealth: Payer: Self-pay | Admitting: *Deleted

## 2013-11-27 NOTE — Progress Notes (Signed)
   Subjective:    Patient ID: Earl Hatcher Sr., male    DOB: 04-03-1941, 73 y.o.   MRN: [id number]  HPI Pt presents to the clinic to check labs. He has been on spirolactone 12.5mg  daily. He was originally taken off spironolactone completely due to renal function. However patient notices significant increase of shortness of breath when he goes off medication. He started on his own at a 12.5 mg dose daily. He feels better even on this dose. He is being managed by Cardiology, Dr. [name] for CHF. Denies any lower leg edema today.   Patient did not like the urologist we've previously sent him to for elevated PSA and BPH. Per patient urologist was originally to do nothing and just waited and then he was called suddenly and told they wanted to do all these tests on him. He did not like the communication that was given.    Review of Systems  All other systems reviewed and are negative.      Objective:   Physical Exam  Constitutional: He is oriented to person, place, and time. He appears well-developed and well-nourished.  HENT:  Head: Normocephalic and atraumatic.  Neck: Normal range of motion. Neck supple. No JVD present.  Cardiovascular: Normal rate, regular rhythm and normal heart sounds.   Pulmonary/Chest: Effort normal and breath sounds normal. He has no wheezes.  Neurological: He is alert and oriented to person, place, and time.  Negative for any lower leg edema today.   Skin: Skin is dry.  Psychiatric: He has a normal mood and affect. His behavior is normal.          Assessment & Plan:  CHF/SOB- continue on Spirolactone 12.5 mg daily Will check BMP today. We'll call with results.  Elevated PSA/BPH- we'll recheck PSA today. Will send patient to another urologist for evaluation.

## 2013-11-27 NOTE — Telephone Encounter (Signed)
ICM transmission received. I left a message for the patient to call. 

## 2013-11-29 ENCOUNTER — Other Ambulatory Visit: Payer: Self-pay | Admitting: *Deleted

## 2013-11-29 DIAGNOSIS — I429 Cardiomyopathy, unspecified: Secondary | ICD-10-CM

## 2013-11-29 DIAGNOSIS — I509 Heart failure, unspecified: Secondary | ICD-10-CM

## 2013-11-29 MED ORDER — FUROSEMIDE 40 MG PO TABS: 40.0000 mg | ORAL_TABLET | Freq: Every day | ORAL | Status: DC

## 2013-11-30 NOTE — Telephone Encounter (Signed)
I left a message for the patient to call. 

## 2013-12-05 ENCOUNTER — Other Ambulatory Visit: Payer: Self-pay | Admitting: *Deleted

## 2013-12-05 DIAGNOSIS — I509 Heart failure, unspecified: Secondary | ICD-10-CM

## 2013-12-05 DIAGNOSIS — I429 Cardiomyopathy, unspecified: Secondary | ICD-10-CM

## 2013-12-05 MED ORDER — FUROSEMIDE 40 MG PO TABS: 40.0000 mg | ORAL_TABLET | Freq: Every day | ORAL | Status: DC

## 2014-01-03 ENCOUNTER — Other Ambulatory Visit: Payer: Self-pay | Admitting: *Deleted

## 2014-01-03 MED ORDER — METFORMIN HCL ER (MOD) 500 MG PO TB24: 500.0000 mg | ORAL_TABLET | Freq: Every day | ORAL | Status: DC

## 2014-01-15 NOTE — Telephone Encounter (Signed)
No call back from the patient. He is due for his next transmission on 01/22/14. I will follow with him then.

## 2014-01-22 ENCOUNTER — Ambulatory Visit (INDEPENDENT_AMBULATORY_CARE_PROVIDER_SITE_OTHER): Payer: Medicare Other | Admitting: *Deleted

## 2014-01-22 ENCOUNTER — Encounter: Payer: Self-pay | Admitting: *Deleted

## 2014-01-22 DIAGNOSIS — Z95810 Presence of automatic (implantable) cardiac defibrillator: Secondary | ICD-10-CM | POA: Diagnosis not present

## 2014-01-22 DIAGNOSIS — I420 Dilated cardiomyopathy: Secondary | ICD-10-CM

## 2014-01-22 DIAGNOSIS — I428 Other cardiomyopathies: Secondary | ICD-10-CM

## 2014-01-22 LAB — MDC_IDC_ENUM_SESS_TYPE_REMOTE
Battery Remaining Longevity: 66 mo
Battery Remaining Percentage: 78 %
Battery Voltage: 2.96 V
Brady Statistic AP VS Percent: 1 %
HIGH POWER IMPEDANCE MEASURED VALUE: 46 Ohm
HighPow Impedance: 46 Ohm
Implantable Pulse Generator Serial Number: 7070427
Lead Channel Impedance Value: 450 Ohm
Lead Channel Impedance Value: 900 Ohm
Lead Channel Pacing Threshold Amplitude: 0.75 V
Lead Channel Pacing Threshold Amplitude: 0.75 V
Lead Channel Pacing Threshold Amplitude: 0.875 V
Lead Channel Pacing Threshold Pulse Width: 0.5 ms
Lead Channel Sensing Intrinsic Amplitude: 5 mV
Lead Channel Setting Pacing Amplitude: 2 V
Lead Channel Setting Pacing Amplitude: 2.5 V
Lead Channel Setting Sensing Sensitivity: 0.5 mV
MDC IDC MSMT LEADCHNL LV PACING THRESHOLD PULSEWIDTH: 0.5 ms
MDC IDC MSMT LEADCHNL RA IMPEDANCE VALUE: 490 Ohm
MDC IDC MSMT LEADCHNL RV PACING THRESHOLD PULSEWIDTH: 0.5 ms
MDC IDC MSMT LEADCHNL RV SENSING INTR AMPL: 11.6 mV
MDC IDC SESS DTM: 20150727070518
MDC IDC SET LEADCHNL LV PACING AMPLITUDE: 2 V
MDC IDC SET LEADCHNL LV PACING PULSEWIDTH: 0.5 ms
MDC IDC SET LEADCHNL RV PACING PULSEWIDTH: 0.5 ms
MDC IDC STAT BRADY AP VP PERCENT: 98 %
MDC IDC STAT BRADY AS VP PERCENT: 1.7 %
MDC IDC STAT BRADY AS VS PERCENT: 1 %
MDC IDC STAT BRADY RA PERCENT PACED: 98 %
Zone Setting Detection Interval: 300 ms
Zone Setting Detection Interval: 400 ms

## 2014-01-22 NOTE — Progress Notes (Signed)
Remote ICD transmission.   

## 2014-01-22 NOTE — Progress Notes (Addendum)
EPIC Encounter for ICM Monitoring  Patient Name: Earl Parekh Sr. is a 73 y.o. male Date: 01/22/2014 Primary Care Physican: [name], PA-C Primary Cardiologist: [name] Electrophysiologist: Allred  Dry Weight: 238 lbs   Bi-V pacing: >99%      In the past month, have you:  1. Gained more than 2 pounds in a day or more than 5 pounds in a week? Uncertain, he has been traveling a lot in the past few weeks as his wife was sick and in the hospital, then she passed away. He has been traveling out of state with her arrangements. He states he is getting back on his routine.  2. Had changes in your medications (with verification of current medications)? no  3. Had more shortness of breath than is usual for you? no  4. Limited your activity because of shortness of breath? no  5. Not been able to sleep because of shortness of breath? no  6. Had increased swelling in your feet or ankles? no  7. Had symptoms of dehydration (dizziness, dry mouth, increased thirst, decreased urine output) no  8. Had changes in sodium restriction? no  9. Been compliant with medication? Yes   ICM trend:   Follow-up plan: ICM clinic phone appointment: 02/22/14. The patient has been asymptomatic with elevations in his corvue readings. He does appear to be trending back to baseline. As above, he has been traveling a lot lately due to the death of his wife and her funeral arrangements. He has been instructed to call if any symptoms develop.  Copy of note sent to patient's primary care physician, primary cardiologist, and device following physician.  Alvis Lemmings, RN, BSN 01/22/2014 1:33 PM

## 2014-01-22 NOTE — Addendum Note (Signed)
Addended by: Alvis Lemmings C on: 01/22/2014 01:38 PM   Modules accepted: Level of Service

## 2014-01-24 ENCOUNTER — Other Ambulatory Visit: Payer: Self-pay | Admitting: *Deleted

## 2014-01-24 MED ORDER — CARVEDILOL 25 MG PO TABS: 25.0000 mg | ORAL_TABLET | Freq: Two times a day (BID) | ORAL | Status: DC

## 2014-01-24 NOTE — Telephone Encounter (Signed)
Coreg refilled to [facility]. Margette Fast, CMA

## 2014-02-01 ENCOUNTER — Encounter: Payer: Self-pay | Admitting: Cardiology

## 2014-02-10 ENCOUNTER — Other Ambulatory Visit: Payer: Self-pay | Admitting: Physician Assistant

## 2014-02-12 ENCOUNTER — Other Ambulatory Visit: Payer: Self-pay | Admitting: Physician Assistant

## 2014-02-15 ENCOUNTER — Other Ambulatory Visit: Payer: Self-pay | Admitting: Physician Assistant

## 2014-02-16 ENCOUNTER — Other Ambulatory Visit: Payer: Self-pay | Admitting: Physician Assistant

## 2014-02-20 ENCOUNTER — Encounter: Payer: Self-pay | Admitting: Physician Assistant

## 2014-02-20 ENCOUNTER — Ambulatory Visit (INDEPENDENT_AMBULATORY_CARE_PROVIDER_SITE_OTHER): Payer: Medicare Other

## 2014-02-20 ENCOUNTER — Encounter: Payer: Self-pay | Admitting: Internal Medicine

## 2014-02-20 ENCOUNTER — Ambulatory Visit (INDEPENDENT_AMBULATORY_CARE_PROVIDER_SITE_OTHER): Payer: Medicare Other | Admitting: Physician Assistant

## 2014-02-20 VITALS — BP 128/58 | HR 80 | Ht 70.0 in | Wt 246.0 lb

## 2014-02-20 DIAGNOSIS — E118 Type 2 diabetes mellitus with unspecified complications: Secondary | ICD-10-CM

## 2014-02-20 DIAGNOSIS — M25562 Pain in left knee: Secondary | ICD-10-CM

## 2014-02-20 DIAGNOSIS — IMO0002 Reserved for concepts with insufficient information to code with codable children: Secondary | ICD-10-CM | POA: Diagnosis not present

## 2014-02-20 DIAGNOSIS — M25569 Pain in unspecified knee: Secondary | ICD-10-CM | POA: Diagnosis not present

## 2014-02-20 DIAGNOSIS — M1710 Unilateral primary osteoarthritis, unspecified knee: Secondary | ICD-10-CM

## 2014-02-20 DIAGNOSIS — M25469 Effusion, unspecified knee: Secondary | ICD-10-CM | POA: Diagnosis not present

## 2014-02-20 DIAGNOSIS — M109 Gout, unspecified: Secondary | ICD-10-CM

## 2014-02-20 DIAGNOSIS — M1000 Idiopathic gout, unspecified site: Secondary | ICD-10-CM

## 2014-02-20 LAB — POCT GLYCOSYLATED HEMOGLOBIN (HGB A1C): HEMOGLOBIN A1C: 7

## 2014-02-20 MED ORDER — TERAZOSIN HCL 2 MG PO CAPS: 2.0000 mg | ORAL_CAPSULE | Freq: Every day | ORAL | Status: DC

## 2014-02-20 MED ORDER — TRAMADOL HCL 50 MG PO TABS: ORAL_TABLET | ORAL | Status: DC

## 2014-02-20 MED ORDER — METFORMIN HCL ER 500 MG PO TB24: ORAL_TABLET | ORAL | Status: DC

## 2014-02-20 MED ORDER — SPIRONOLACTONE 25 MG PO TABS: ORAL_TABLET | ORAL | Status: DC

## 2014-02-20 MED ORDER — LISINOPRIL 20 MG PO TABS: ORAL_TABLET | ORAL | Status: DC

## 2014-02-20 NOTE — Progress Notes (Signed)
   Subjective:    Patient ID: Earl Hatcher Sr., male    DOB: 02-25-1941, 73 y.o.   MRN: [id number]  HPI Pt is a 73 yo male with hx of gout with left knee pain for last 1 1/2 weeks. Most of his pain is localized behind the knee. No trauma. Seems to get better with moving it. Pain worse with flexion but better with extension. No warmth.   DM- not checking sugars. Wife died in 2022-12-04 and not keeping a good diet. Denies any hypoglycemia. No wounds or sores.   Review of Systems  All other systems reviewed and are negative.      Objective:   Physical Exam  Constitutional: He is oriented to person, place, and time. He appears well-developed and well-nourished.  HENT:  Head: Normocephalic and atraumatic.  Cardiovascular: Normal rate, regular rhythm and normal heart sounds.   Pulmonary/Chest: Effort normal and breath sounds normal.  Musculoskeletal:  Left knee:  Full extension without pain.  Pain with flexion able to get to 100 degrees.  No pain with palpation over knee joint.  No significant swelling.  Negative anterior drawer.  Some pain with apley.  Strength 5/5 of left leg.   Neurological: He is alert and oriented to person, place, and time.  Skin: Skin is dry.  Psychiatric: He has a normal mood and affect. His behavior is normal.          Assessment & Plan:  Left knee posterior pain/gout- will get xray. Will check uric acid level. Continue on allopurinol that currently on.  Could be a gout component. i feel like some of the pain could be coming from hamstring and where they insert. Gave hamstrings stretches. Follow up in 2 weeks. Certainly try ibuprofen. Will check kidney function. Ibuprofen can also help with gout attack once confirmed.   DM type II- .[name] Lab Results  Component Value Date   HGBA1C 7.0 02/20/2014   Continue on same plan of metformin.. Discussed borderline sugars. mircoalbumin good. Foot exam with some spots of neuropathy on heel and harden areas.  Make sure  keeping to diabetic diet. Follow up in 3 months.

## 2014-02-20 NOTE — Patient Instructions (Signed)
Hamstring Strain with Rehab The hamstring muscle and tendons are vulnerable to muscle or tendon tear (strain). Hamstring tears cause pain and inflammation in the backside of the thigh, where the hamstring muscles are located. The hamstring is comprised of three muscles that are responsible for straightening the hip, bending the knee, and stabilizing the knee. These muscles are important for walking, running, and jumping. Hamstring strain is the most common injury of the thigh. Hamstring strains are classified as grade 1 or 2 strains. Grade 1 strains cause pain, but the tendon is not lengthened. Grade 2 strains include a lengthened ligament due to the ligament being stretched or partially ruptured. With grade 2 strains there is still function, although the function may be decreased.  SYMPTOMS   Pain, tenderness, swelling, warmth, or redness over the hamstring muscles, at the back of the thigh.  Pain that gets worse during and after intense activity.  A "pop" heard in the area, at the time of injury.  Muscle spasm in the hamstring muscles.  Pain or weakness with running, jumping, or bending the knee against resistance.  Crackling sound (crepitation) when the tendon is moved or touched.  Bruising (contusion) in the thigh within 48 hours of injury.  Loss of fullness of the muscle, or area of muscle bulging in the case of a complete rupture. CAUSES  A muscle strain occurs when a force is placed on the muscle or tendon that is greater than it can withstand. Common causes of injury include:  Strain from overuse or sudden increase in the frequency, duration, or intensity of activity.  Single violent blow or force to the back of the knee or the hamstring area of the thigh. RISK INCREASES WITH:  Sports that require quick starts (sprinting, racquetball, tennis).  Sports that require jumping (basketball, volleyball).  Kicking sports and water skiing.  Contact sports (soccer, football).  Poor  strength and flexibility.  Failure to warm up properly before activity.  Previous thigh, knee, or pelvis injury.  Poor exercise technique.  Poor posture. PREVENTION  Maintain physical fitness:  Strength, flexibility, and endurance.  Cardiovascular fitness.  Learn and use proper exercise technique and posture.  Wear proper fitted and padded protective equipment. PROGNOSIS  If treated properly, hamstring strains are usually curable in 2 to 6 weeks. RELATED COMPLICATIONS   Longer healing time, if not properly treated or if not given adequate time to heal.  Chronically inflamed tendon, causing persistent pain with activity that may progress to constant pain.  Recurring symptoms, if activity is resumed too soon.  Vulnerable to repeated injury (in up to 25% of cases). TREATMENT  Treatment first involves the use of ice and medication to help reduce pain and inflammation. It is also important to complete strengthening and stretching exercises, as well as modifying any activities that aggravate the symptoms. These exercises may be completed at home or with a therapist. Your caregiver may recommend the use of crutches to help reduce pain and discomfort, especially is the strain is severe enough to cause limping. If the tendon has pulled away from the bone, then surgery is necessary to reattach it. MEDICATION   If pain medicine is needed, nonsteroidal anti-inflammatory medicines (aspirin and ibuprofen), or other minor pain relievers (acetaminophen), are often advised.  Do not take pain medicine for 7 days before surgery.  Prescription pain relievers may be given if your caregiver thinks they are needed. Use only as directed and only as much as you need.  Corticosteroid injections may be  recommended. However, these injections should only be used for serious cases, as they can only be given a certain number of times.  Ointments applied to the skin may be beneficial. HEAT AND  COLD  Cold treatment (icing) relieves pain and reduces inflammation. Cold treatment should be applied for 10 to 15 minutes every 2 to 3 hours, and immediately after activity that aggravates your symptoms. Use ice packs or an ice massage.  Heat treatment may be used before performing the stretching and strengthening activities prescribed by your caregiver, physical therapist, or athletic trainer. Use a heat pack or a warm water soak. SEEK MEDICAL CARE IF:   Symptoms get worse or do not improve in 2 weeks, despite treatment.  New, unexplained symptoms develop. (Drugs used in treatment may produce side effects.) EXERCISES RANGE OF MOTION (ROM) AND STRETCHING EXERCISES - Hamstring Strain These exercises may help you when beginning to rehabilitate your injury. Your symptoms may go away with or without further involvement from your physician, physical therapist or athletic trainer. While completing these exercises, remember:   Restoring tissue flexibility helps normal motion to return to the joints. This allows healthier, less painful movement and activity.  An effective stretch should be held for at least 30 seconds.  A stretch should never be painful. You should only feel a gentle lengthening or release in the stretched tissue. STRETCH - Hamstrings, Standing  Stand or sit, and extend your right / left leg, placing your foot on a chair or foot stool.  Keep a slight arch in your low back and your hips straight forward.  Lead with your chest, and lean forward at the waist until you feel a gentle stretch in the back of your right / left knee or thigh. (When done correctly, this exercise requires leaning only a small distance.)  Hold this position for __________ seconds. Repeat __________ times. Complete this stretch __________ times per day. STRETCH - Hamstrings, Supine   Lie on your back. Loop a belt or towel over the ball of your right / left foot.  Straighten your right / left knee and  slowly pull on the belt to raise your leg. Do not allow the right / left knee to bend. Keep your opposite leg flat on the floor.  Raise the leg until you feel a gentle stretch behind your right / left knee or thigh. Hold this position for __________ seconds. Repeat __________ times. Complete this stretch __________ times per day.  STRETCH - Hamstrings, Doorway  Lie on your back with your right / left leg extended and resting on the wall, and the opposite leg flat on the ground through the door. Initially, position your bottom farther away from the wall.  Keep your right / left knee straight. If you feel a stretch behind your knee or thigh, hold this position for __________ seconds.  If you do not feel a stretch, scoot your bottom closer to the door and hold __________ seconds. Repeat __________ times. Complete this stretch __________ times per day.  STRETCH - Hamstrings/Adductors, V-Sit   Sit on the floor with your legs extended in a large "V," keeping your knees straight.  With your head and chest upright, bend at your waist reaching for your left foot to stretch your right thigh muscles.  You should feel a stretch in your right inner thigh. Hold for __________ seconds.  Return to the upright position to relax your leg muscles.  Continuing to keep your chest upright, bend straight forward at your  waist to stretch your hamstrings.  You should feel a stretch behind both of your thighs and knees. Hold for __________ seconds.  Return to the upright position to relax your leg muscles.  With your head and chest upright, bend at your waist reaching for your right foot to stretch your left thigh muscles.  You should feel a stretch in your left inner thigh. Hold for __________ seconds.  Return to the upright position to relax your leg muscles. Repeat __________ times. Complete this exercise __________ times per day.  STRENGTHENING EXERCISES - Hamstring Strain These exercises may help you  when beginning to rehabilitate your injury. They may resolve your symptoms with or without further involvement from your physician, physical therapist or athletic trainer. While completing these exercises, remember:   Muscles can gain both the endurance and the strength needed for everyday activities through controlled exercises.  Complete these exercises as instructed by your physician, physical therapist or athletic trainer. Increase the resistance and repetitions only as guided.  You may experience muscle soreness or fatigue, but the pain or discomfort you are trying to eliminate should never get worse during these exercises. If this pain does get worse, stop and make certain you are following the directions exactly. If the pain is still present after adjustments, discontinue the exercise until you can discuss the trouble with your clinician. STRENGTH - Hip Extensors, Straight Leg Raises   Lie on your stomach on a firm surface.  Tense the muscles in your buttocks to lift your right / left leg about 4 inches. If you cannot lift your leg this high without arching your back, place a pillow under your hips.  Keep your knee straight. Hold for __________ seconds.  Slowly lower your leg to the starting position and allow it to relax completely before starting the next repetition. Repeat __________ times. Complete this exercise __________ times per day.  STRENGTH - Hamstring, Isometrics   Lie on your back on a firm surface.  Bend your right / left knee approximately __________ degrees.  Dig your heel into the surface, as if you are trying to pull it toward your buttocks. Tighten the muscles in the back of your thighs to "dig" as hard as you can, without increasing any pain.  Hold this position for __________ seconds.  Release the tension gradually and allow your muscles to completely relax for __________ seconds between each exercise. Repeat __________ times. Complete this exercise __________  times per day.  STRENGTH - Hamstring, Curls   Lay on your stomach with your legs extended. (If you lay on a bed, your feet may hang over the edge.)  Tighten the muscles in the back of your thigh to bend your right / left knee up to 90 degrees. Keep your hips flat on the bed or floor.  Hold this position for __________ seconds.  Slowly lower your leg back to the starting position. Repeat __________ times. Complete this exercise __________ times per day.  OPTIONAL ANKLE WEIGHTS: Begin with ____________________, but DO NOT exceed ____________________. Increase in 1 pound/0.5 kilogram increments. Document Released: 06/15/2005 Document Revised: 09/07/2011 Document Reviewed: 09/27/2008 Exit[employer] Patient Information 2015 [employer]. This information is not intended to replace advice given to you by your health care provider. Make sure you discuss any questions you have with your health care provider.  

## 2014-02-21 ENCOUNTER — Other Ambulatory Visit: Payer: Self-pay | Admitting: Physician Assistant

## 2014-02-21 LAB — COMPLETE METABOLIC PANEL WITH GFR
ALBUMIN: 4.5 g/dL (ref 3.5–5.2)
ALT: 17 U/L (ref 0–53)
AST: 21 U/L (ref 0–37)
Alkaline Phosphatase: 56 U/L (ref 39–117)
BUN: 22 mg/dL (ref 6–23)
CALCIUM: 9.8 mg/dL (ref 8.4–10.5)
CO2: 30 meq/L (ref 19–32)
CREATININE: 1.13 mg/dL (ref 0.50–1.35)
Chloride: 99 mEq/L (ref 96–112)
GFR, EST AFRICAN AMERICAN: 74 mL/min
GFR, EST NON AFRICAN AMERICAN: 64 mL/min
Glucose, Bld: 124 mg/dL — ABNORMAL HIGH (ref 70–99)
Potassium: 4.1 mEq/L (ref 3.5–5.3)
Sodium: 141 mEq/L (ref 135–145)
Total Bilirubin: 0.5 mg/dL (ref 0.2–1.2)
Total Protein: 7.3 g/dL (ref 6.0–8.3)

## 2014-02-21 LAB — POCT UA - MICROALBUMIN
Albumin/Creatinine Ratio, Urine, POC: <30
Creatinine, POC: 50 mg/dL
Microalbumin Ur, POC: 10 mg/L

## 2014-02-21 LAB — URIC ACID: Uric Acid, Serum: 8 mg/dL — ABNORMAL HIGH (ref 4.0–7.8)

## 2014-02-21 MED ORDER — COLCHICINE 0.6 MG PO TABS: ORAL_TABLET | ORAL | Status: DC

## 2014-02-21 MED ORDER — ALLOPURINOL 100 MG PO TABS: 100.0000 mg | ORAL_TABLET | Freq: Two times a day (BID) | ORAL | Status: DC

## 2014-02-22 ENCOUNTER — Encounter: Payer: Self-pay | Admitting: *Deleted

## 2014-02-22 ENCOUNTER — Ambulatory Visit (INDEPENDENT_AMBULATORY_CARE_PROVIDER_SITE_OTHER): Payer: Medicare Other | Admitting: *Deleted

## 2014-02-22 DIAGNOSIS — I420 Dilated cardiomyopathy: Secondary | ICD-10-CM

## 2014-02-22 DIAGNOSIS — I428 Other cardiomyopathies: Secondary | ICD-10-CM

## 2014-02-22 DIAGNOSIS — Z95810 Presence of automatic (implantable) cardiac defibrillator: Secondary | ICD-10-CM

## 2014-02-22 NOTE — Progress Notes (Signed)
EPIC Encounter for ICM Monitoring  Patient Name: Earl Trompeter Sr. is a 73 y.o. male Date: 02/22/2014 Primary Care Physican: [name], PA-C Primary Cardiologist: [name] Electrophysiologist: Allred Dry Weight: 238 lbs  Bi-V pacing: >99%       In the past month, have you:  1. Gained more than 2 pounds in a day or more than 5 pounds in a week? no  2. Had changes in your medications (with verification of current medications)? no  3. Had more shortness of breath than is usual for you? no  4. Limited your activity because of shortness of breath? no  5. Not been able to sleep because of shortness of breath? no  6. Had increased swelling in your feet or ankles? no  7. Had symptoms of dehydration (dizziness, dry mouth, increased thirst, decreased urine output) no  8. Had changes in sodium restriction? no  9. Been compliant with medication? Yes   ICM trend:   Follow-up plan: ICM clinic phone appointment: 03/26/14.   Copy of note sent to patient's primary care physician, primary cardiologist, and device following physician.  Alvis Lemmings, RN, BSN 02/22/2014 3:35 PM

## 2014-03-14 ENCOUNTER — Telehealth: Payer: Self-pay | Admitting: Physician Assistant

## 2014-03-14 ENCOUNTER — Ambulatory Visit (INDEPENDENT_AMBULATORY_CARE_PROVIDER_SITE_OTHER): Payer: Medicare Other | Admitting: Cardiology

## 2014-03-14 ENCOUNTER — Encounter: Payer: Self-pay | Admitting: Cardiology

## 2014-03-14 VITALS — BP 130/86 | HR 75 | Ht 70.0 in | Wt 249.0 lb

## 2014-03-14 DIAGNOSIS — Z95810 Presence of automatic (implantable) cardiac defibrillator: Secondary | ICD-10-CM

## 2014-03-14 DIAGNOSIS — I5022 Chronic systolic (congestive) heart failure: Secondary | ICD-10-CM

## 2014-03-14 DIAGNOSIS — I10 Essential (primary) hypertension: Secondary | ICD-10-CM

## 2014-03-14 DIAGNOSIS — I509 Heart failure, unspecified: Secondary | ICD-10-CM

## 2014-03-14 DIAGNOSIS — I428 Other cardiomyopathies: Secondary | ICD-10-CM

## 2014-03-14 DIAGNOSIS — I420 Dilated cardiomyopathy: Secondary | ICD-10-CM

## 2014-03-14 NOTE — Progress Notes (Signed)
HPI: FU congestive heart failure. Patient previously resided in [place] and then [place]. He apparently was diagnosed with a cardiomyopathy. He does not know the etiology and denies catheterization. He has had previous CRT-D. He moved here in March of 2014. Echocardiogram in January of 2015 showed an ejection fraction of 20-25%, severe left ventricular hypertrophy and biatrial enlargement. Since last seen, He lost his wife. He is appropriately grieving. He denies chest pain, dyspnea or syncope.   Current Outpatient Prescriptions  Medication Sig Dispense Refill  . allopurinol (ZYLOPRIM) 100 MG tablet Take 1 tablet (100 mg total) by mouth 2 (two) times daily.  60 tablet  1  . aspirin 81 MG tablet Take 81 mg by mouth daily.      . carvedilol (COREG) 25 MG tablet Take 1 tablet (25 mg total) by mouth 2 (two) times daily with a meal.  60 tablet  3  . colchicine 0.6 MG tablet Take 2 tabletsas needed and then 1 tablet 1 hour later if symptoms persist can repeat for up to 3 days.      . furosemide (LASIX) 40 MG tablet Take 1 tablet (40 mg total) by mouth daily. Take 1 extra tablet ( 40 mg) of weight gain 2-3 lbs in 24 hours  30 tablet  3  . lisinopril (PRINIVIL,ZESTRIL) 20 MG tablet TAKE ONE TABLET BY MOUTH ONCE DAILY  30 tablet  5  . metFORMIN (GLUCOPHAGE-XR) 500 MG 24 hr tablet TAKE ONE TABLET BY MOUTH WITH BREAKFAST.  30 tablet  5  . sennosides-docusate sodium (SENOKOT-S) 8.6-50 MG tablet Take 1 tablet by mouth as needed. For constipation      . simvastatin (ZOCOR) 40 MG tablet Take 1 tablet (40 mg total) by mouth every evening.  30 tablet  3  . spironolactone (ALDACTONE) 25 MG tablet TAKE ONE-HALF TABLET BY MOUTH ONCE DAILY  30 tablet  5  . terazosin (HYTRIN) 2 MG capsule Take 1 capsule (2 mg total) by mouth at bedtime.  90 capsule  1  . traMADol (ULTRAM) 50 MG tablet 1-2 tabs by mouth Q8 hours, maximum 6 tabs per day.  60 tablet  0   No current facility-administered medications  for this visit.     Past Medical History  Diagnosis Date  . CHF (congestive heart failure)   . Hypertension   . Diabetes mellitus     Type II. Diet controlled  . Hyperlipidemia   . ICD (implantable cardioverter-defibrillator) in place   . BPH (benign prostatic hyperplasia)   . Gout   . SOB (shortness of breath)   . Lower extremity edema   . Nonischemic cardiomyopathy     Past Surgical History  Procedure Laterality Date  . Cardiac defibrillator placement  Jan 2014    SJM Quadra Assura implanted by Dr [name] in [place]    History   Social History  . Marital Status: Married    Spouse Name: N/A    Number of Children: 1  . Years of Education: N/A   Occupational History  . Not on file.   Social History Main Topics  . Smoking status: Never Smoker   . Smokeless tobacco: Not on file  . Alcohol Use: Yes     Comment: Occasional  . Drug Use: No  . Sexual Activity: Yes   Other Topics Concern  . Not on file   Social History Narrative   Previously lived in [place] before moving to [place].  He has recently  relocated to [place].    ROS: no fevers or chills, productive cough, hemoptysis, dysphasia, odynophagia, melena, hematochezia, dysuria, hematuria, rash, seizure activity, orthopnea, PND, pedal edema, claudication. Remaining systems are negative.  Physical Exam: Well-developed well-nourished in no acute distress.  Skin is warm and dry.  HEENT is normal.  Neck is supple.  Chest is clear to auscultation with normal expansion.  Cardiovascular exam is regular rate and rhythm.  Abdominal exam nontender or distended. No masses palpated. Extremities show no edema. neuro grossly intact  ECG Sinus rhythm with ventricular pacing.

## 2014-03-14 NOTE — Patient Instructions (Signed)
Your physician wants you to follow-up in: 6 MONTHS WITH DR CRENSHAW You will receive a reminder letter in the mail two months in advance. If you don't receive a letter, please call our office to schedule the follow-up appointment.  

## 2014-03-14 NOTE — Telephone Encounter (Signed)
Call pt: how is knee pain? Did the increase in alloupurinol help?

## 2014-03-14 NOTE — Assessment & Plan Note (Signed)
Blood pressure controlled. Continue present medications. 

## 2014-03-14 NOTE — Assessment & Plan Note (Signed)
Continue ACE inhibitor, beta blocker and spironolactone. We will again try to obtain records from [facility] concerning previous ischemia evaluation.

## 2014-03-14 NOTE — Telephone Encounter (Signed)
Left message for patient to call back  

## 2014-03-14 NOTE — Assessment & Plan Note (Signed)
Management per electrophysiology. 

## 2014-03-14 NOTE — Assessment & Plan Note (Signed)
Patient is euvolemic on examination. Continue present dose of diuretics. 

## 2014-03-23 ENCOUNTER — Ambulatory Visit: Payer: Medicare Other | Admitting: Physician Assistant

## 2014-03-24 ENCOUNTER — Other Ambulatory Visit: Payer: Self-pay | Admitting: Physician Assistant

## 2014-03-26 ENCOUNTER — Telehealth: Payer: Self-pay | Admitting: *Deleted

## 2014-03-26 ENCOUNTER — Encounter: Payer: Medicare Other | Admitting: *Deleted

## 2014-03-26 NOTE — Telephone Encounter (Signed)
ICM transmission received. I left a message for the patient to call. 

## 2014-04-02 ENCOUNTER — Other Ambulatory Visit: Payer: Self-pay

## 2014-04-02 DIAGNOSIS — E118 Type 2 diabetes mellitus with unspecified complications: Secondary | ICD-10-CM

## 2014-04-02 MED ORDER — SIMVASTATIN 40 MG PO TABS: 40.0000 mg | ORAL_TABLET | Freq: Every evening | ORAL | Status: DC

## 2014-04-25 ENCOUNTER — Encounter: Payer: Self-pay | Admitting: Internal Medicine

## 2014-04-25 ENCOUNTER — Ambulatory Visit (INDEPENDENT_AMBULATORY_CARE_PROVIDER_SITE_OTHER): Payer: Medicare Other | Admitting: *Deleted

## 2014-04-25 DIAGNOSIS — I420 Dilated cardiomyopathy: Secondary | ICD-10-CM

## 2014-04-25 NOTE — Progress Notes (Signed)
Remote ICD transmission.   

## 2014-04-29 LAB — MDC_IDC_ENUM_SESS_TYPE_REMOTE
Brady Statistic RA Percent Paced: 98 %
Brady Statistic RV Percent Paced: 99 %
HIGH POWER IMPEDANCE MEASURED VALUE: 49 Ohm
Lead Channel Impedance Value: 490 Ohm
Lead Channel Impedance Value: 940 Ohm
Lead Channel Pacing Threshold Amplitude: 0.75 V
Lead Channel Pacing Threshold Amplitude: 0.75 V
Lead Channel Pacing Threshold Pulse Width: 0.5 ms
Lead Channel Sensing Intrinsic Amplitude: 5 mV
Lead Channel Setting Pacing Amplitude: 2 V
Lead Channel Setting Pacing Amplitude: 2 V
Lead Channel Setting Pacing Amplitude: 2.5 V
Lead Channel Setting Pacing Pulse Width: 0.5 ms
Lead Channel Setting Sensing Sensitivity: 0.5 mV
MDC IDC MSMT LEADCHNL RA PACING THRESHOLD PULSEWIDTH: 0.5 ms
MDC IDC MSMT LEADCHNL RV IMPEDANCE VALUE: 480 Ohm
MDC IDC MSMT LEADCHNL RV PACING THRESHOLD AMPLITUDE: 0.875 V
MDC IDC MSMT LEADCHNL RV PACING THRESHOLD PULSEWIDTH: 0.5 ms
MDC IDC PG SERIAL: 7070427
MDC IDC SET LEADCHNL RV PACING PULSEWIDTH: 0.5 ms
MDC IDC SET ZONE DETECTION INTERVAL: 300 ms
Zone Setting Detection Interval: 400 ms

## 2014-05-09 ENCOUNTER — Other Ambulatory Visit: Payer: Self-pay

## 2014-05-09 MED ORDER — FUROSEMIDE 40 MG PO TABS: 20.0000 mg | ORAL_TABLET | Freq: Every day | ORAL | Status: DC

## 2014-05-16 ENCOUNTER — Encounter: Payer: Self-pay | Admitting: Cardiology

## 2014-05-31 ENCOUNTER — Encounter: Payer: Medicare Other | Admitting: *Deleted

## 2014-06-01 ENCOUNTER — Telehealth: Payer: Self-pay | Admitting: Cardiology

## 2014-06-01 NOTE — Telephone Encounter (Signed)
LMOVM reminding pt to send remote transmission.   

## 2014-06-18 ENCOUNTER — Encounter: Payer: Self-pay | Admitting: *Deleted

## 2014-07-09 ENCOUNTER — Ambulatory Visit (INDEPENDENT_AMBULATORY_CARE_PROVIDER_SITE_OTHER): Payer: Medicare Other | Admitting: *Deleted

## 2014-07-09 DIAGNOSIS — I5022 Chronic systolic (congestive) heart failure: Secondary | ICD-10-CM

## 2014-07-09 DIAGNOSIS — Z95810 Presence of automatic (implantable) cardiac defibrillator: Secondary | ICD-10-CM

## 2014-07-11 ENCOUNTER — Other Ambulatory Visit: Payer: Self-pay | Admitting: Physician Assistant

## 2014-07-13 ENCOUNTER — Encounter: Payer: Self-pay | Admitting: *Deleted

## 2014-07-13 NOTE — Progress Notes (Signed)
EPIC Encounter for ICM Monitoring  Patient Name: Earl Lascola Sr. is a 74 y.o. male Date: 07/13/2014 Primary Care Physican: [name], PA-C Primary Cardiologist: [name] Electrophysiologist: Allred Dry Weight: 238 lbs  Bi-V pacing:  >99%       In the past month, have you:  1. Gained more than 2 pounds in a day or more than 5 pounds in a week? no  2. Had changes in your medications (with verification of current medications)? no  3. Had more shortness of breath than is usual for you? no  4. Limited your activity because of shortness of breath? no  5. Not been able to sleep because of shortness of breath? no  6. Had increased swelling in your feet or ankles? no  7. Had symptoms of dehydration (dizziness, dry mouth, increased thirst, decreased urine output) no  8. Had changes in sodium restriction? no  9. Been compliant with medication? Yes   ICM trend:   Follow-up plan: ICM clinic phone appointment in: 08/13/14  Copy of note sent to patient's primary care physician, primary cardiologist, and device following physician.  Alvis Lemmings, RN, BSN 07/13/2014 12:03 PM

## 2014-08-01 ENCOUNTER — Other Ambulatory Visit: Payer: Self-pay | Admitting: Physician Assistant

## 2014-08-02 NOTE — Telephone Encounter (Signed)
Needs to have uric acid checked

## 2014-08-13 ENCOUNTER — Ambulatory Visit (INDEPENDENT_AMBULATORY_CARE_PROVIDER_SITE_OTHER): Payer: Medicare Other | Admitting: *Deleted

## 2014-08-13 DIAGNOSIS — Z95810 Presence of automatic (implantable) cardiac defibrillator: Secondary | ICD-10-CM

## 2014-08-13 DIAGNOSIS — I5022 Chronic systolic (congestive) heart failure: Secondary | ICD-10-CM | POA: Diagnosis not present

## 2014-08-14 ENCOUNTER — Encounter: Payer: Self-pay | Admitting: *Deleted

## 2014-08-14 NOTE — Progress Notes (Signed)
EPIC Encounter for ICM Monitoring  Patient Name: Earl Scala Sr. is a 74 y.o. male Date: 08/14/2014 Primary Care Physican: [name], PA-C Primary Cardiologist: [name] Electrophysiologist: Allred Dry Weight: 251 lbs  Bi-V pacing: > 99%       In the past month, have you:  1. Gained more than 2 pounds in a day or more than 5 pounds in a week? No. The patient states that he lost his wife several months ago and since that time, he has been gaining weight. He states he does not have much of an appetite and he does try to exercise 2-3 times/ week in his home gym. Corvue readings were elevated from ~ 1/12-1/24. He does not recall doing anything differently during that time.   2. Had changes in your medications (with verification of current medications)? no  3. Had more shortness of breath than is usual for you? Occasionally with going up steps.   4. Limited your activity because of shortness of breath? no  5. Not been able to sleep because of shortness of breath? no  6. Had increased swelling in your feet or ankles? no  7. Had symptoms of dehydration (dizziness, dry mouth, increased thirst, decreased urine output) no  8. Had changes in sodium restriction? no  9. Been compliant with medication? Yes   ICM trend:   Follow-up plan: ICM clinic phone appointment: 10/04/14. The patient states that his weight has been trending up over the last several months. He feels tight around his belly. He is occasionally more SOB with walking up steps. This may be due to inactivity. He feels that he is urinating ok. He is drinking water, but the amount has not changed. He regularly takes lasix 40 mg once daily. I have advised him to take an extra 1/2 tablet daily x 3 days to see if this will help his SOB or make any change in his weight. He is agreeable. He will follow up with Dr. Rayann Heman on 09/03/14.  Copy of note sent to patient's primary care physician, primary cardiologist, and device following  physician.  Alvis Lemmings, RN, BSN 08/14/2014 1:19 PM

## 2014-09-03 ENCOUNTER — Encounter: Payer: Self-pay | Admitting: Internal Medicine

## 2014-09-03 ENCOUNTER — Other Ambulatory Visit: Payer: Self-pay

## 2014-09-03 ENCOUNTER — Ambulatory Visit (INDEPENDENT_AMBULATORY_CARE_PROVIDER_SITE_OTHER): Payer: Medicare Other | Admitting: Internal Medicine

## 2014-09-03 VITALS — BP 132/80 | HR 76 | Ht 70.0 in | Wt 263.0 lb

## 2014-09-03 DIAGNOSIS — Z95810 Presence of automatic (implantable) cardiac defibrillator: Secondary | ICD-10-CM | POA: Diagnosis not present

## 2014-09-03 DIAGNOSIS — I10 Essential (primary) hypertension: Secondary | ICD-10-CM

## 2014-09-03 DIAGNOSIS — I5022 Chronic systolic (congestive) heart failure: Secondary | ICD-10-CM

## 2014-09-03 DIAGNOSIS — I429 Cardiomyopathy, unspecified: Secondary | ICD-10-CM

## 2014-09-03 DIAGNOSIS — I5023 Acute on chronic systolic (congestive) heart failure: Secondary | ICD-10-CM

## 2014-09-03 DIAGNOSIS — I420 Dilated cardiomyopathy: Secondary | ICD-10-CM | POA: Diagnosis not present

## 2014-09-03 LAB — MDC_IDC_ENUM_SESS_TYPE_INCLINIC
Battery Remaining Longevity: 58.8 mo
Brady Statistic RA Percent Paced: 98 %
Brady Statistic RV Percent Paced: 99.36 %
HIGH POWER IMPEDANCE MEASURED VALUE: 46.0533
Implantable Pulse Generator Serial Number: 7070427
Lead Channel Impedance Value: 437.5 Ohm
Lead Channel Pacing Threshold Amplitude: 0.75 V
Lead Channel Pacing Threshold Amplitude: 0.875 V
Lead Channel Pacing Threshold Pulse Width: 0.5 ms
Lead Channel Pacing Threshold Pulse Width: 0.5 ms
Lead Channel Pacing Threshold Pulse Width: 0.5 ms
Lead Channel Setting Pacing Amplitude: 2 V
Lead Channel Setting Pacing Amplitude: 2.5 V
Lead Channel Setting Pacing Pulse Width: 0.5 ms
MDC IDC MSMT LEADCHNL LV IMPEDANCE VALUE: 900 Ohm
MDC IDC MSMT LEADCHNL RA IMPEDANCE VALUE: 512.5 Ohm
MDC IDC MSMT LEADCHNL RA SENSING INTR AMPL: 5 mV
MDC IDC MSMT LEADCHNL RV PACING THRESHOLD AMPLITUDE: 1 V
MDC IDC MSMT LEADCHNL RV SENSING INTR AMPL: 11.6 mV
MDC IDC SESS DTM: 20160307164407
MDC IDC SET LEADCHNL LV PACING PULSEWIDTH: 0.5 ms
MDC IDC SET LEADCHNL RV PACING AMPLITUDE: 2 V
MDC IDC SET LEADCHNL RV SENSING SENSITIVITY: 0.5 mV
MDC IDC SET ZONE DETECTION INTERVAL: 300 ms
Zone Setting Detection Interval: 400 ms

## 2014-09-03 MED ORDER — POTASSIUM CHLORIDE CRYS ER 20 MEQ PO TBCR: 20.0000 meq | EXTENDED_RELEASE_TABLET | Freq: Every day | ORAL | Status: DC

## 2014-09-03 MED ORDER — FUROSEMIDE 40 MG PO TABS: 80.0000 mg | ORAL_TABLET | Freq: Two times a day (BID) | ORAL | Status: DC

## 2014-09-03 NOTE — Progress Notes (Signed)
Electrophysiology Office Note   Date:  09/03/2014   ID:  Earl Hatcher Sr., DOB [birth year], MRN [id number]  PCP:  [name], PA-C  Cardiologist:  Dr [name] Primary Electrophysiologist: Thompson Grayer, MD    Chief Complaint  Patient presents with  . Follow-up    Congestive dilated CM & CHF     History of Present Illness: Earl Steiner Sr. is a 74 y.o. male who presents today for electrophysiology evaluation.   He reports recently progressive SOB.  He has a nonischemic CM (EF 20%) and NYHA Class II/III CHF sp BiV ICD (SJM) by Dr [name] in [place].    Over the past few weeks, he has had significant worsening of SOB.  Today, he became very SOB ambulating into our office and required O2 due to sats of 80s%.  He denies CP.  He reports compliance with medicine.  He has edema.  He also reports significant abdominal distension over the past few weeks. Today, he  denies symptoms of palpitations,  PND, dizziness, presyncope, syncope, or neurologic sequela.  The patientis tolerating medications without difficulties and is otherwise without complaint today.    Past Medical History  Diagnosis Date  . CHF (congestive heart failure)   . Hypertension   . Diabetes mellitus     Type II. Diet controlled  . Hyperlipidemia   . ICD (implantable cardioverter-defibrillator) in place   . BPH (benign prostatic hyperplasia)   . Gout   . SOB (shortness of breath)   . Lower extremity edema   . Nonischemic cardiomyopathy    Past Surgical History  Procedure Laterality Date  . Cardiac defibrillator placement  Jan 2014    SJM Quadra Assura implanted by Dr [name] in [place]     Current Outpatient Prescriptions  Medication Sig Dispense Refill  . allopurinol (ZYLOPRIM) 100 MG tablet TAKE ONE TABLET BY MOUTH TWICE DAILY 60 tablet 0  . aspirin 81 MG tablet Take 81 mg by mouth daily.    . carvedilol (COREG) 25 MG tablet Take 1 tablet (25 mg total) by mouth 2 (two) times daily with  a meal. 60 tablet 3  . colchicine 0.6 MG tablet Take 2 tabletsas needed and then 1 tablet 1 hour later if symptoms persist can repeat for up to 3 days.    . furosemide (LASIX) 40 MG tablet Take 2 tablets (80 mg total) by mouth 2 (two) times daily. 120 tablet 3  . lisinopril (PRINIVIL,ZESTRIL) 20 MG tablet TAKE ONE TABLET BY MOUTH ONCE DAILY 30 tablet 5  . metFORMIN (GLUCOPHAGE-XR) 500 MG 24 hr tablet TAKE ONE TABLET BY MOUTH WITH BREAKFAST. 30 tablet 5  . potassium chloride SA (K-DUR,KLOR-CON) 20 MEQ tablet Take 1 tablet (20 mEq total) by mouth daily. 90 tablet 3  . sennosides-docusate sodium (SENOKOT-S) 8.6-50 MG tablet Take 1 tablet by mouth as needed. For constipation    . simvastatin (ZOCOR) 40 MG tablet Take 1 tablet by mouth in the evening - OFFICE APPOINTMENT NEEDED FOR REFILL 30 tablet 0  . spironolactone (ALDACTONE) 25 MG tablet Take 25 mg by mouth daily.    [name] terazosin (HYTRIN) 2 MG capsule Take 1 capsule (2 mg total) by mouth at bedtime. 90 capsule 1  . traMADol (ULTRAM) 50 MG tablet 1-2 tabs by mouth Q8 hours, maximum 6 tabs per day. 60 tablet 0   No current facility-administered medications for this visit.    Allergies:   Review of patient's allergies indicates no known allergies.  Social History:  The patient  reports that he has never smoked. He does not have any smokeless tobacco history on file. He reports that he drinks alcohol. He reports that he does not use illicit drugs.   Family History:  The patient's family history includes Diabetes in his brother and mother; Hyperlipidemia in his mother.    ROS:  Please see the history of present illness.   All other systems are reviewed and negative.    PHYSICAL EXAM: VS:  BP 132/80 mmHg  Pulse 76  Ht 5\' 10"  (1.778 m)  Wt 263 lb (119.296 kg)  BMI 37.74 kg/m2  SpO2 88% , BMI Body mass index is 37.74 kg/(m^2). GEN: Well nourished, well developed, in no acute distress HEENT: normal Neck: + JVD, carotid bruits, or  masses Cardiac: RRR; no murmurs, rubs, or gallops,+1 edema  Respiratory:  clear to auscultation bilaterally, normal work of breathing GI: soft, nontender, + distended, + BS MS: no deformity or atrophy Skin: warm and dry, device pocket is well healed Neuro:  Strength and sensation are intact Psych: euthymic mood, full affect  EKG:  EKG is ordered today. The ekg ordered today shows AV paced, QRS 166 msec  Device interrogation is reviewed today in detail.  See PaceArt for details.   Recent Labs: 02/20/2014: ALT 17; BUN 22; Creatinine 1.13; Potassium 4.1; Sodium 141    Lipid Panel  No results found for: CHOL, TRIG, HDL, CHOLHDL, VLDL, LDLCALC, LDLDIRECT   Wt Readings from Last 3 Encounters:  09/03/14 263 lb (119.296 kg)  03/14/14 249 lb (112.946 kg)  02/20/14 246 lb (111.585 kg)      Other studies Reviewed: Additional studies/ records that were reviewed today include: Dr [name] notes    ASSESSMENT AND PLAN:  1. Acute on chronic systolic dysfunction The patient has significant volume overload on exam.  He is at high risk for hospitalization/ decompensation.  I have recommended IV lasix the office vs admission today.  He is very clear in his decision to decline both and would prefer to try an aggressive outpatient regimen.  This may be reasonable with close outpatient follow-up.  Sats today are 88% on RA. I will increase lasix from 40mg  daily to 80mg  BID today.  Check BMET today 2 gram sodium diet Add Kdur 20 meq daily Return on Friday for NP/PA follow-up and repeat bmet.  Normal BiV ICD function See Pace Art report No changes today He is 100% BIV paced.  I will obtain a CXR on return to evaluate LV lead position.  I would also refer to our nonresponder CRT clinic for further optimization of his CRT-D device with Chanetta Marshall NP next week.  As his device was implanted elsehwere, I think that it would be beneficial to look at his intrinsic QRS upon return as well.  [place]  has been recently elevated He will continue to follow with Alvis Lemmings in the [facility] clinic  2. HTN Stable No change required today  3. Obesity Weight loss is encouraged  [name]  Labs/ tests ordered today include:  Orders Placed This Encounter  Procedures  . Basic metabolic panel  . Implantable device check  . EKG 12-Lead     Signed, Thompson Grayer, MD  09/03/2014 9:49 PM     [place] [address] [phone] (office) [phone] (fax)

## 2014-09-03 NOTE — Patient Instructions (Addendum)
Your physician recommends that you schedule a follow-up appointment on Friday with flex PA or NP, and next week with Chanetta Marshall, NP   Will need a CXR and repeat BMP of FRI  Your physician has recommended you make the following change in your medication:  1) Increase Furosemide to 80mg  twice daily 2) Start Kdur 20 meq daily  Your physician recommends that you return for lab work today BMP   Remote monitoring is used to monitor your Pacemaker or ICD from home. This monitoring reduces the number of office visits required to check your device to one time per year. It allows [place] to keep an eye on the functioning of your device to ensure it is working properly. You are scheduled for a device check from home on 12/03/14. You may send your transmission at any time that day. If you have a wireless device, the transmission will be sent automatically. After your physician reviews your transmission, you will receive a postcard with your next transmission date.

## 2014-09-04 LAB — BASIC METABOLIC PANEL
BUN: 22 mg/dL (ref 6–23)
CHLORIDE: 105 meq/L (ref 96–112)
CO2: 34 mEq/L — ABNORMAL HIGH (ref 19–32)
CREATININE: 1.28 mg/dL (ref 0.40–1.50)
Calcium: 9.5 mg/dL (ref 8.4–10.5)
GFR: 70.68 mL/min (ref >60.00)
Glucose, Bld: 106 mg/dL — ABNORMAL HIGH (ref 70–99)
Potassium: 4.7 mEq/L (ref 3.5–5.1)
Sodium: 142 mEq/L (ref 135–145)

## 2014-09-06 ENCOUNTER — Telehealth: Payer: Self-pay | Admitting: *Deleted

## 2014-09-06 NOTE — Telephone Encounter (Signed)
Called patient to reevaluate s/s of CHF exacerbation after increased diuretic dose 09/03/14.  Patient reports he "feels 100% better." He was instructed to call if he had increased SOB and edema in lower extremities and abdomen. Patient verbalizes understanding.

## 2014-09-07 ENCOUNTER — Ambulatory Visit: Payer: Medicare Other | Admitting: Physician Assistant

## 2014-09-14 ENCOUNTER — Telehealth: Payer: Self-pay | Admitting: Cardiology

## 2014-09-16 ENCOUNTER — Other Ambulatory Visit: Payer: Self-pay | Admitting: Physician Assistant

## 2014-09-17 ENCOUNTER — Encounter: Payer: Self-pay | Admitting: Internal Medicine

## 2014-09-17 NOTE — Telephone Encounter (Signed)
Close encounter 

## 2014-09-25 ENCOUNTER — Other Ambulatory Visit: Payer: Self-pay | Admitting: *Deleted

## 2014-09-25 MED ORDER — SPIRONOLACTONE 25 MG PO TABS: 25.0000 mg | ORAL_TABLET | Freq: Every day | ORAL | Status: DC

## 2014-10-02 ENCOUNTER — Encounter: Payer: Self-pay | Admitting: Nurse Practitioner

## 2014-10-02 NOTE — Progress Notes (Signed)
Electrophysiology CRT Nonresponder Note  Date: 10/03/2014  ID:  Earl Hatcher Sr., DOB 1941-02-26, MRN [id number]  PCP: [name], PA-C Primary Cardiologist: [name] Electrophysiologist: Allred  CC: heart failure despite LV lead placement  Earl Hatcher Sr. is a 74 y.o. male is seen today for Dr Rayann Heman.  He presents today for optimization of CRT.  He has NYHA Class 2 CHF symptoms.  He was seen by Dr Rayann Heman 09/03/14 at which time IV Lasix and/or hospital admission was recommended, both of which he declined.  His Lasix po dose was increased and he was to follow-up with an APP a few days later, but no-showed that visit.  Since last being seen in our clinic, the patient reports improvement in shortness of breath.  He has been marginally compliant with increased Lasix dose (not always taking second dose).  He denies chest pain, palpitations, nausea, vomiting, dizziness, syncope, weight gain.  He states that he still has some abdominal distention as well as shortness of breath with exertion.  He says his dry weight at home is around 242 pounds and this morning he weighted 248 pounds. According to our scales, he is down 9 pounds from office visit last month.    Device History: STJ CRTD implanted 2014 for NICM and CHF in [place] History of appropriate therapy: No History of AAD therapy: No  LV lead History: Model: 1458Q Location: unknown Threshold: 0.75V@0 .29msec Vector: D1-P4 Revisions/CXR: no revisions, no CXR available Diaphragmatic Stim: none noted VV timing: programmed LV first by 40 Prior optimzation/changes: none  Echocardiogram: Pre-device implant: unknown Post-device implant: 06/2013 - EF 20-25%  EKG: Pre-device implant: unknown Post-device implant: QRS 166   Past Medical History  Diagnosis Date  . CHF (congestive heart failure)   . Hypertension   . Diabetes mellitus     Type II. Diet controlled  . Hyperlipidemia   . BPH (benign prostatic hyperplasia)   . Gout   .  Nonischemic cardiomyopathy     a. s/p STJ CRTD   Past Surgical History  Procedure Laterality Date  . Cardiac defibrillator placement  Jan 2014    SJM Quadra Assura implanted by Dr [name] in [place]    Current Outpatient Prescriptions  Medication Sig Dispense Refill  . allopurinol (ZYLOPRIM) 100 MG tablet TAKE ONE TABLET BY MOUTH TWICE DAILY 60 tablet 0  . aspirin 81 MG tablet Take 81 mg by mouth daily.    . carvedilol (COREG) 25 MG tablet Take 1 tablet (25 mg total) by mouth 2 (two) times daily with a meal. 60 tablet 3  . colchicine 0.6 MG tablet Take 2 tabletsas needed and then 1 tablet 1 hour later if symptoms persist can repeat for up to 3 days.    . furosemide (LASIX) 40 MG tablet Take 2 tablets (80 mg total) by mouth 2 (two) times daily. 120 tablet 3  . lisinopril (PRINIVIL,ZESTRIL) 20 MG tablet TAKE ONE TABLET BY MOUTH ONCE DAILY 30 tablet 5  . metFORMIN (GLUCOPHAGE-XR) 500 MG 24 hr tablet TAKE ONE TABLET BY MOUTH WITH BREAKFAST. 30 tablet 5  . potassium chloride SA (K-DUR,KLOR-CON) 20 MEQ tablet Take 1 tablet (20 mEq total) by mouth daily. 90 tablet 3  . sennosides-docusate sodium (SENOKOT-S) 8.6-50 MG tablet Take 1 tablet by mouth as needed. For constipation    . simvastatin (ZOCOR) 40 MG tablet Take 1 tablet by mouth in the evening - OFFICE APPOINTMENT NEEDED FOR REFILL 30 tablet 0  . spironolactone (ALDACTONE) 25 MG tablet  Take 1 tablet (25 mg total) by mouth daily. 30 tablet 3  . terazosin (HYTRIN) 2 MG capsule Take 1 capsule (2 mg total) by mouth at bedtime. 90 capsule 1  . traMADol (ULTRAM) 50 MG tablet 1-2 tabs by mouth Q8 hours, maximum 6 tabs per day. 60 tablet 0   No current facility-administered medications for this visit.    Allergies:   Review of patient's allergies indicates no known allergies.   Social History: History   Social History  . Marital Status: Married    Spouse Name: N/A  . Number of Children: 1  . Years of Education: N/A    Occupational History  . Not on file.   Social History Main Topics  . Smoking status: Never Smoker   . Smokeless tobacco: Not on file  . Alcohol Use: Yes     Comment: Occasional  . Drug Use: No  . Sexual Activity: Yes   Other Topics Concern  . Not on file   Social History Narrative   Previously lived in [place] before moving to [place].  He has recently relocated to [place].    Family History: Family History  Problem Relation Age of Onset  . Diabetes Mother   . Hyperlipidemia Mother   . Diabetes Brother     Review of Systems: All other systems reviewed and are otherwise negative except as noted above.   Physical Exam: VS:  BP 122/68 mmHg  Pulse 75  Ht 5\' 10"  (1.778 m)  Wt 254 lb 9.6 oz (115.486 kg)  BMI 36.53 kg/m2 , BMI Body mass index is 36.53 kg/(m^2).  Wt Readings from Last 3 Encounters:  10/03/14 254 lb 9.6 oz (115.486 kg)  09/03/14 263 lb (119.296 kg)  03/14/14 249 lb (112.946 kg)     GEN- The patient is obese appearing, alert and oriented x 3 today.   HEENT: normocephalic, atraumatic; sclera clear, conjunctiva pink; hearing intact; oropharynx clear; neck supple, +JVP Lungs- Clear to ausculation bilaterally, normal work of breathing.  No wheezes, rales, rhonchi Heart- Regular rate and rhythm, no murmurs, rubs or gallops GI- soft, non-tender, + distended, bowel sounds present Extremities- no clubbing, cyanosis, 1+ edema MS- no significant deformity or atrophy Skin- warm and dry, no rash or lesion; device pocket well healed Psych- euthymic mood, full affect Neuro- strength and sensation are intact  ICD interrogation: CRT pacing: 99% AF: 0% PVC burden: <1%% Thoracic impedence: improved Activity Level: 1-4 hours per day HR excursion: adequate See PaceArt report for full details.   EKG:  EKG is ordered today. The ekg ordered today shows sinus rhythm with LBBB QRS 269msec with CRT off, sinus rhythm with ventricular pacing, QRS 160msec (LV first  by 70) with CRT on  Recent Labs: 02/20/2014: ALT 17 09/03/2014: BUN 22; Creatinine 1.28; Potassium 4.7; Sodium 142     Other studies Reviewed: Additional studies/ records that were reviewed today include: Dr Jackalyn Lombard office notes  Assessment and Plan:  1.  Chronic systolic heart failure Patient reports some improvent status post CRT implant but with recent recurrent HF symptoms Device interrogation today as above - base rate set at 75.  I am unable to find documentation as to why and the patient is unsure from implant. Will d/w Dr Rayann Heman and decrease base rate to 60 at next office visit.  V-V optimization done today with LV programmed first by 36msec (presenting was LV first by 50msec) Will order CXR to evaluate LV lead position AV optimization echo  CBC/BMET  Continue  Lasix 80mg  twice daily until seen next week Compliance with medications encouraged Will d/w Dr [name] referral to AHF clinic  2.  HTN Stable No change required today  3.  Obesity Weight loss encouraged   Current medicines are reviewed at length with the patient today.   The patient does not have concerns regarding his medicines.  The following changes were made today:  none  Labs/ tests ordered today include:  Orders Placed This Encounter  Procedures  . DG Chest 2 View  . Basic Metabolic Panel (BMET)  . EKG 12-Lead  . 2D Echocardiogram without contrast     Disposition:   Follow up with me in 1 week for AV optimization echo    Signed, Chanetta Marshall, NP 10/03/2014 3:31 PM  [place] [address] [phone] (office) [phone] (fax)

## 2014-10-03 ENCOUNTER — Other Ambulatory Visit: Payer: Self-pay | Admitting: Physician Assistant

## 2014-10-03 ENCOUNTER — Encounter: Payer: Self-pay | Admitting: Nurse Practitioner

## 2014-10-03 ENCOUNTER — Ambulatory Visit (INDEPENDENT_AMBULATORY_CARE_PROVIDER_SITE_OTHER): Payer: Medicare Other | Admitting: Nurse Practitioner

## 2014-10-03 ENCOUNTER — Ambulatory Visit
Admission: RE | Admit: 2014-10-03 | Discharge: 2014-10-03 | Disposition: A | Discharge status: Home / Self Care | Payer: Medicare Other | Source: Ambulatory Visit | Attending: Nurse Practitioner | Admitting: Nurse Practitioner

## 2014-10-03 VITALS — BP 122/68 | HR 75 | Ht 70.0 in | Wt 254.6 lb

## 2014-10-03 DIAGNOSIS — I5022 Chronic systolic (congestive) heart failure: Secondary | ICD-10-CM

## 2014-10-03 DIAGNOSIS — R0602 Shortness of breath: Secondary | ICD-10-CM | POA: Diagnosis not present

## 2014-10-03 DIAGNOSIS — I10 Essential (primary) hypertension: Secondary | ICD-10-CM | POA: Diagnosis not present

## 2014-10-03 LAB — BASIC METABOLIC PANEL
BUN: 37 mg/dL — AB (ref 6–23)
CHLORIDE: 103 meq/L (ref 96–112)
CO2: 30 mEq/L (ref 19–32)
CREATININE: 1.55 mg/dL — AB (ref 0.40–1.50)
Calcium: 10.7 mg/dL — ABNORMAL HIGH (ref 8.4–10.5)
GFR: 56.66 mL/min — ABNORMAL LOW (ref >60.00)
GLUCOSE: 102 mg/dL — AB (ref 70–99)
Potassium: 5.1 mEq/L (ref 3.5–5.1)
Sodium: 138 mEq/L (ref 135–145)

## 2014-10-03 NOTE — Patient Instructions (Addendum)
Your physician recommends that you have lab work today: BMP  A chest x-ray takes a picture of the organs and structures inside the chest, including the heart, lungs, and blood vessels. This test can show several things, including, whether the heart is enlarges; whether fluid is building up in the lungs; and whether pacemaker / defibrillator leads are still in place. Address: [address], [place], [place] [address] (1st floor) [phone]  Hours: Open today  8AM-5PM    Your physician has recommended that you have an AV optimization echo (Amber to do). During this procedure, an echocardiogram is performed to optimize the timing of your device using ultrasound and a device programmer. Changes will be made to the device settings to help the heart chambers pump more efficiently. This procedure takes approximately one hour.  Your physician recommends that you continue on your current medications as directed. Please refer to the Current Medication list given to you today.  Your physician recommends that you schedule a follow-up appointment in: 1 week with Chanetta Marshall, NP (AV opt echo same day)

## 2014-10-04 ENCOUNTER — Encounter: Payer: Self-pay | Admitting: Nurse Practitioner

## 2014-10-04 ENCOUNTER — Telehealth: Payer: Self-pay | Admitting: Cardiology

## 2014-10-04 ENCOUNTER — Encounter: Payer: Medicare Other | Admitting: *Deleted

## 2014-10-04 NOTE — Telephone Encounter (Signed)
Spoke with pt and reminded pt of remote transmission that is due today. Pt verbalized understanding.   

## 2014-10-09 ENCOUNTER — Ambulatory Visit (INDEPENDENT_AMBULATORY_CARE_PROVIDER_SITE_OTHER): Payer: Medicare Other | Admitting: *Deleted

## 2014-10-09 ENCOUNTER — Encounter: Payer: Self-pay | Admitting: Internal Medicine

## 2014-10-09 ENCOUNTER — Ambulatory Visit (HOSPITAL_COMMUNITY): Payer: Medicare Other | Attending: Cardiology

## 2014-10-09 DIAGNOSIS — I5022 Chronic systolic (congestive) heart failure: Secondary | ICD-10-CM | POA: Diagnosis not present

## 2014-10-09 DIAGNOSIS — I509 Heart failure, unspecified: Secondary | ICD-10-CM

## 2014-10-09 LAB — MDC_IDC_ENUM_SESS_TYPE_INCLINIC: Implantable Pulse Generator Serial Number: 7070427

## 2014-10-09 NOTE — Progress Notes (Signed)
Limited 2D Echo AV optimization with Sandy from [facility] and  Dr. Lovena Le completed. 10/09/2014

## 2014-10-09 NOTE — Progress Notes (Signed)
AV opt performed in office by industry professional. See paceart for full report.

## 2014-10-10 ENCOUNTER — Encounter: Payer: Self-pay | Admitting: Nurse Practitioner

## 2014-10-10 ENCOUNTER — Encounter: Payer: Self-pay | Admitting: Internal Medicine

## 2014-10-10 ENCOUNTER — Ambulatory Visit (INDEPENDENT_AMBULATORY_CARE_PROVIDER_SITE_OTHER): Payer: Medicare Other | Admitting: Nurse Practitioner

## 2014-10-10 ENCOUNTER — Other Ambulatory Visit: Payer: Self-pay | Admitting: Internal Medicine

## 2014-10-10 VITALS — BP 104/60 | HR 70 | Ht 70.0 in | Wt 254.6 lb

## 2014-10-10 DIAGNOSIS — Z79899 Other long term (current) drug therapy: Secondary | ICD-10-CM

## 2014-10-10 DIAGNOSIS — I5022 Chronic systolic (congestive) heart failure: Secondary | ICD-10-CM

## 2014-10-10 NOTE — Patient Instructions (Signed)
Medication Instructions:  Your physician recommends that you continue on your current medications as directed. Please refer to the Current Medication list given to you today.  Labwork: BMET today  Testing/Procedures: None  Follow-Up: Please keep your appointment with Dr. [name] on 10/17/14.  Your physician recommends that you schedule a follow-up appointment in: 4 weeks with Chanetta Marshall, NP.   Thank you for choosing [facility]!!

## 2014-10-10 NOTE — Progress Notes (Signed)
Electrophysiology CRT Nonresponder Follow up Note  Date: 10/10/2014  ID:  Earl Hatcher Sr., DOB Nov 11, 1940, MRN [id number]  PCP: [name], PA-C Primary Cardiologist: [name] Electrophysiologist: Allred  CC: heart failure follow up  Earl Hatcher Sr. is a 74 y.o. male is seen today for Dr Rayann Heman.  He returns today for follow up after recent VV and AV optimization echo.  He reports that his shortness of breath is improved and that he has been compliant with increased dose of Lasix.  Home weight is stable.  Spironolactone discontinued after recent lab work showed increased creatinine and K of 5.  He denies chest pain, palpitations, nausea, vomiting, dizziness, syncope, weight gain.  He says his dry weight at home is around 242 pounds and this morning he weighted 248 pounds. According to our scales, he is down 9 pounds from office visit last month.    Device History: STJ CRTD implanted 2014 for NICM and CHF in [place] History of appropriate therapy: No History of AAD therapy: No  LV lead History: Model: 1458Q Location: unknown Threshold: 0.75V@0 .66msec Vector: D1-P4 Revisions/CXR: no revisions, no CXR available Diaphragmatic Stim: none noted VV timing: programmed LV first by 40 Prior optimzation/changes: AV opt echo with AV delay programmed at 275/225  Echocardiogram: Pre-device implant: unknown Post-device implant: 06/2013 - EF 20-25%; 09/2014 EF 20-25% at time of AV opt echo  EKG: Pre-device implant: unknown Post-device implant: QRS 166   Past Medical History  Diagnosis Date  . CHF (congestive heart failure)   . Hypertension   . Diabetes mellitus     Type II. Diet controlled  . Hyperlipidemia   . BPH (benign prostatic hyperplasia)   . Gout   . Nonischemic cardiomyopathy     a. s/p STJ CRTD   Past Surgical History  Procedure Laterality Date  . Cardiac defibrillator placement  Jan 2014    SJM Quadra Assura implanted by Dr [name] in [place]     Current Outpatient Prescriptions  Medication Sig Dispense Refill  . allopurinol (ZYLOPRIM) 100 MG tablet TAKE ONE TABLET BY MOUTH TWICE DAILY. NEED TO HAVE URIC ACID CHECKED. 30 tablet 0  . aspirin 81 MG tablet Take 81 mg by mouth daily.    . carvedilol (COREG) 25 MG tablet Take 1 tablet (25 mg total) by mouth 2 (two) times daily with a meal. 60 tablet 3  . colchicine 0.6 MG tablet Take 2 tabletsas needed and then 1 tablet 1 hour later if symptoms persist can repeat for up to 3 days.    . furosemide (LASIX) 40 MG tablet Take 2 tablets (80 mg total) by mouth 2 (two) times daily. 120 tablet 3  . lisinopril (PRINIVIL,ZESTRIL) 20 MG tablet TAKE ONE TABLET BY MOUTH ONCE DAILY 30 tablet 5  . metFORMIN (GLUCOPHAGE-XR) 500 MG 24 hr tablet TAKE ONE TABLET BY MOUTH WITH BREAKFAST 15 tablet 0  . potassium chloride SA (K-DUR,KLOR-CON) 20 MEQ tablet Take 1 tablet (20 mEq total) by mouth daily. 90 tablet 3  . sennosides-docusate sodium (SENOKOT-S) 8.6-50 MG tablet Take 1 tablet by mouth daily as needed. For constipation    . simvastatin (ZOCOR) 40 MG tablet Take 1 tablet by mouth in the evening - OFFICE APPOINTMENT NEEDED FOR REFILL 30 tablet 0  . terazosin (HYTRIN) 2 MG capsule TAKE ONE CAPSULE BY MOUTH AT BEDTIME 15 capsule 0  . traMADol (ULTRAM) 50 MG tablet 1-2 tabs by mouth Q8 hours, maximum 6 tabs per day. 60 tablet 0  .  spironolactone (ALDACTONE) 25 MG tablet Take 1 tablet (25 mg total) by mouth daily. (Patient not taking: Reported on 10/10/2014) 30 tablet 3   No current facility-administered medications for this visit.    Allergies:   Review of patient's allergies indicates no known allergies.   Social History: History   Social History  . Marital Status: Married    Spouse Name: N/A  . Number of Children: 1  . Years of Education: N/A   Occupational History  . Not on file.   Social History Main Topics  . Smoking status: Never Smoker   . Smokeless tobacco: Not on file  . Alcohol Use:  Yes     Comment: Occasional  . Drug Use: No  . Sexual Activity: Yes   Other Topics Concern  . Not on file   Social History Narrative   Previously lived in [place] before moving to [place].  He has recently relocated to [place].    Family History: Family History  Problem Relation Age of Onset  . Diabetes Mother   . Hyperlipidemia Mother   . Diabetes Brother     Review of Systems: All other systems reviewed and are otherwise negative except as noted above.   Physical Exam: VS:  BP 104/60 mmHg  Pulse 70  Ht 5\' 10"  (1.778 m)  Wt 254 lb 9.6 oz (115.486 kg)  BMI 36.53 kg/m2 , BMI Body mass index is 36.53 kg/(m^2).  Wt Readings from Last 3 Encounters:  10/10/14 254 lb 9.6 oz (115.486 kg)  10/03/14 254 lb 9.6 oz (115.486 kg)  09/03/14 263 lb (119.296 kg)     GEN- The patient is obese appearing, alert and oriented x 3 today.   HEENT: normocephalic, atraumatic; sclera clear, conjunctiva pink; hearing intact; oropharynx clear; neck supple, +JVP Lungs- Clear to ausculation bilaterally, normal work of breathing.  No wheezes, rales, rhonchi Heart- Regular rate and rhythm, no murmurs, rubs or gallops GI- soft, non-tender, + distended, bowel sounds present Extremities- no clubbing, cyanosis, edema MS- no significant deformity or atrophy Skin- warm and dry, no rash or lesion; device pocket well healed Psych- euthymic mood, full affect Neuro- strength and sensation are intact   Recent Labs: 02/20/2014: ALT 17 10/03/2014: BUN 37*; Creatinine 1.55*; Potassium 5.1; Sodium 138     Other studies Reviewed: Additional studies/ records that were reviewed today include: Dr Jackalyn Lombard office notes  Assessment and Plan:  1.  Chronic systolic heart failure Patient reports improvent status post VV and AV optimization Base rate lowered to 60 today BMET today Compliance with medications encouraged Follow up with Dr [name] next week as scheduled  2.  HTN Stable No change required  today  3.  Obesity Weight loss encouraged   Current medicines are reviewed at length with the patient today.   The patient does not have concerns regarding his medicines.  The following changes were made today:  none  Labs/ tests ordered today include:  Orders Placed This Encounter  Procedures  . Basic Metabolic Panel (BMET)    Disposition:   Follow up with me in 4 weeks  Signed, Chanetta Marshall, NP 10/10/2014 4:54 PM  [address] [place] [address] [phone] (office) [phone] (fax)

## 2014-10-11 ENCOUNTER — Telehealth: Payer: Self-pay | Admitting: *Deleted

## 2014-10-11 DIAGNOSIS — I10 Essential (primary) hypertension: Secondary | ICD-10-CM

## 2014-10-11 DIAGNOSIS — I429 Cardiomyopathy, unspecified: Secondary | ICD-10-CM

## 2014-10-11 DIAGNOSIS — N289 Disorder of kidney and ureter, unspecified: Secondary | ICD-10-CM

## 2014-10-11 DIAGNOSIS — I5022 Chronic systolic (congestive) heart failure: Secondary | ICD-10-CM

## 2014-10-11 LAB — BASIC METABOLIC PANEL
BUN: 50 mg/dL — AB (ref 6–23)
CALCIUM: 10.2 mg/dL (ref 8.4–10.5)
CO2: 34 meq/L — AB (ref 19–32)
Chloride: 99 mEq/L (ref 96–112)
Creatinine, Ser: 1.65 mg/dL — ABNORMAL HIGH (ref 0.40–1.50)
GFR: 52.71 mL/min — AB (ref >60.00)
Glucose, Bld: 104 mg/dL — ABNORMAL HIGH (ref 70–99)
Potassium: 5.1 mEq/L (ref 3.5–5.1)
SODIUM: 137 meq/L (ref 135–145)

## 2014-10-11 MED ORDER — FUROSEMIDE 40 MG PO TABS: 80.0000 mg | ORAL_TABLET | Freq: Every day | ORAL | Status: DC

## 2014-10-11 NOTE — Telephone Encounter (Signed)
Pt given new med instructions, reviewed daily weights and when to call office for water weight gain, bmet app made for 1 week, pt verbalized understanding. MAR updated.

## 2014-10-11 NOTE — Telephone Encounter (Signed)
-----   Message from Chanetta Marshall, NP sent at 10/11/2014 12:00 PM EDT ----- Please ask patient to decrease Lasix to 80mg  daily and follow weights. Continue to hold Sprinolactone.  Recheck BMET in 1 week. Thank you!

## 2014-10-17 ENCOUNTER — Encounter: Payer: Self-pay | Admitting: Internal Medicine

## 2014-10-17 ENCOUNTER — Ambulatory Visit: Payer: Medicare Other | Admitting: Cardiology

## 2014-10-18 ENCOUNTER — Other Ambulatory Visit: Payer: Medicare Other

## 2014-10-19 ENCOUNTER — Other Ambulatory Visit: Payer: Self-pay | Admitting: Physician Assistant

## 2014-10-22 ENCOUNTER — Other Ambulatory Visit: Payer: Self-pay | Admitting: Physician Assistant

## 2014-10-22 LAB — MDC_IDC_ENUM_SESS_TYPE_INCLINIC: MDC IDC PG SERIAL: 7070427

## 2014-10-31 ENCOUNTER — Other Ambulatory Visit: Payer: Self-pay | Admitting: Physician Assistant

## 2014-11-01 ENCOUNTER — Telehealth: Payer: Self-pay | Admitting: *Deleted

## 2014-11-01 NOTE — Telephone Encounter (Signed)
Called pt and informed him that he will need a f/u appt. He scheduled an appt for 11/02/14 @ 1100. Pt stated that he stopped taking the terazosin .Audelia Hives [place]

## 2014-11-02 ENCOUNTER — Encounter: Payer: Self-pay | Admitting: Physician Assistant

## 2014-11-02 ENCOUNTER — Ambulatory Visit (INDEPENDENT_AMBULATORY_CARE_PROVIDER_SITE_OTHER): Payer: Medicare Other | Admitting: Physician Assistant

## 2014-11-02 VITALS — BP 98/50 | HR 60 | Ht 70.0 in | Wt 254.0 lb

## 2014-11-02 DIAGNOSIS — E119 Type 2 diabetes mellitus without complications: Secondary | ICD-10-CM

## 2014-11-02 DIAGNOSIS — Z79899 Other long term (current) drug therapy: Secondary | ICD-10-CM | POA: Diagnosis not present

## 2014-11-02 DIAGNOSIS — Z23 Encounter for immunization: Secondary | ICD-10-CM

## 2014-11-02 DIAGNOSIS — E785 Hyperlipidemia, unspecified: Secondary | ICD-10-CM

## 2014-11-02 DIAGNOSIS — Z1211 Encounter for screening for malignant neoplasm of colon: Secondary | ICD-10-CM

## 2014-11-02 DIAGNOSIS — M109 Gout, unspecified: Secondary | ICD-10-CM | POA: Diagnosis not present

## 2014-11-02 LAB — POCT UA - MICROALBUMIN
ALBUMIN/CREATININE RATIO, URINE, POC: 30
Creatinine, POC: 300 mg/dL
Microalbumin Ur, POC: 30 mg/L

## 2014-11-02 LAB — POCT GLYCOSYLATED HEMOGLOBIN (HGB A1C): HEMOGLOBIN A1C: 7.1

## 2014-11-02 MED ORDER — ALLOPURINOL 100 MG PO TABS: ORAL_TABLET | ORAL | Status: DC

## 2014-11-02 MED ORDER — METFORMIN HCL ER 750 MG PO TB24: 750.0000 mg | ORAL_TABLET | Freq: Every day | ORAL | Status: DC

## 2014-11-02 NOTE — Progress Notes (Signed)
   Subjective:    Patient ID: Earl Hatcher Sr., male    DOB: Jul 15, 1940, 74 y.o.   MRN: [id number]  HPI  Patient is a 74 year old male who presents to the clinic for 3 month diabetic follow-up. He is taking only metformin extended release once a daily. He denies any problems or concerns. He denies any hypoglycemic events. He denies any complications. He is not checking his sugars. He is trying to watch his sugar and carbs and exercising on a fairly regular basis.   Patient does have a history of congested dilated cardiomyopathy to which she sees a cardiologist for. He is slightly confused on his diuretics. He would like for [place] to see what he is supposed to be taking.     Review of Systems  All other systems reviewed and are negative.      Objective:   Physical Exam  Constitutional: He is oriented to person, place, and time. He appears well-developed and well-nourished.  HENT:  Head: Normocephalic and atraumatic.  Cardiovascular: Normal rate, regular rhythm and normal heart sounds.   Pulmonary/Chest: Effort normal and breath sounds normal. He has no wheezes.  Neurological: He is alert and oriented to person, place, and time.  Skin: Skin is dry.  Psychiatric: He has a normal mood and affect. His behavior is normal.          Assessment & Plan:  Diabetes type 2- .[name] Lab Results  Component Value Date   HGBA1C 7.1 11/02/2014   Nearly controlled today. We'll increase metformin to 750 mg X are daily. Encouraged patient with diet changes and exercise. Patient did get a Tdap today.  Foot exam was normal. Patient declined all other vaccines such as shingles/pneumonia. Pt encouraged to get eye exam in next 3 months.   I did convince patient to get a colonoscopy ordered today.  BPH- patient has not been taking this medication. He discussed starting it back. I discussed with him that due to low blood pressure I would hold off on prostate medication since it can lower even more. Will  reassess in the next 3-6 month follow-ups. He does feel like his symptoms are very minimal.   Gout-he has not had a recent uric acid. We'll recheck today. Allopurinol was refilled today.  Hypertension-patient continues on carvedilol and lisinopril managed by cardiology.   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 He is best to be on 80 mg once daily. He is not supposed to be taking the spironolactone. Spironolactone was taken off medication list today.    hyperlipidemia-patient has not had a recent lipid level. Lab slip was given today. Will refill Zocor accordingly.[name]

## 2014-11-07 ENCOUNTER — Ambulatory Visit (INDEPENDENT_AMBULATORY_CARE_PROVIDER_SITE_OTHER): Payer: Medicare Other | Admitting: Nurse Practitioner

## 2014-11-07 ENCOUNTER — Encounter: Payer: Self-pay | Admitting: Nurse Practitioner

## 2014-11-07 VITALS — BP 126/84 | HR 60 | Ht 70.0 in | Wt 255.0 lb

## 2014-11-07 DIAGNOSIS — I5022 Chronic systolic (congestive) heart failure: Secondary | ICD-10-CM | POA: Diagnosis not present

## 2014-11-07 DIAGNOSIS — I10 Essential (primary) hypertension: Secondary | ICD-10-CM | POA: Diagnosis not present

## 2014-11-07 LAB — BASIC METABOLIC PANEL
BUN: 31 mg/dL — ABNORMAL HIGH (ref 6–23)
CO2: 29 mEq/L (ref 19–32)
Calcium: 9.9 mg/dL (ref 8.4–10.5)
Chloride: 102 mEq/L (ref 96–112)
Creatinine, Ser: 1.34 mg/dL (ref 0.40–1.50)
GFR: 67.01 mL/min (ref >60.00)
Glucose, Bld: 127 mg/dL — ABNORMAL HIGH (ref 70–99)
POTASSIUM: 4.6 meq/L (ref 3.5–5.1)
SODIUM: 138 meq/L (ref 135–145)

## 2014-11-07 NOTE — Patient Instructions (Signed)
Medication Instructions:  Your physician recommends that you continue on your current medications as directed. Please refer to the Current Medication list given to you today.  Labwork: BMET today  Testing/Procedures: None ordered  Follow-Up: Your physician recommends that you schedule a follow-up appointment in: 6 weeks with Dr. [name]  Your physician wants you to follow-up in: 6 months with Dr. Rayann Heman. You will receive a reminder letter in the mail two months in advance. If you don't receive a letter, please call our office to schedule the follow-up appointment.   Thank you for choosing [facility]!!

## 2014-11-07 NOTE — Progress Notes (Addendum)
Electrophysiology ICD clinic note  Date: 11/07/2014  ID:  Earl Hatcher Sr., DOB 03/11/1941, MRN [id number]  PCP: [name], PA-C Primary Cardiologist: [name] Electrophysiologist: Allred  CC: heart failure follow up  Earl Hatcher Sr. is a 74 y.o. male is seen today for Dr Rayann Heman. He has recently undergone AV and V-V optimization and his base pacing rate was decreased from 75-60bpm.  He reports since device optimization, his heart failure symptoms are much improved.  His energy level and fatigue are also improved. He is weighing himself daily without significant weight change.  He denies chest pain, palpitations, nausea, vomiting, dizziness, syncope, weight gain.     Device History: STJ CRTD implanted 2014 for NICM and CHF in [place] History of appropriate therapy: No History of AAD therapy: No  LV lead History: Model: 1458Q Location: unknown Threshold: 0.75V@0 .87msec Vector: D1-P4 Revisions/CXR: no revisions, no CXR available Diaphragmatic Stim: none noted VV timing: programmed LV first by 40 Prior optimzation/changes: none  Echocardiogram: Pre-device implant: unknown Post-device implant: 06/2013 - EF 20-25%  EKG: Pre-device implant: unknown Post-device implant: QRS 166   Past Medical History  Diagnosis Date  . CHF (congestive heart failure)   . Hypertension   . Diabetes mellitus     Type II. Diet controlled  . Hyperlipidemia   . BPH (benign prostatic hyperplasia)   . Gout   . Nonischemic cardiomyopathy     a. s/p STJ CRTD   Past Surgical History  Procedure Laterality Date  . Cardiac defibrillator placement  Jan 2014    SJM Quadra Assura implanted by Dr [name] in [place]    Current Outpatient Prescriptions  Medication Sig Dispense Refill  . allopurinol (ZYLOPRIM) 100 MG tablet TAKE ONE TABLET BY MOUTH TWICE DAILY. NEED TO HAVE URIC ACID CHECKED. 60 tablet 5  . aspirin 81 MG tablet Take 81 mg by mouth daily.    . carvedilol (COREG) 25 MG  tablet TAKE ONE TABLET BY MOUTH TWICE DAILY WITH MEALS 60 tablet 0  . furosemide (LASIX) 40 MG tablet Take 2 tablets (80 mg total) by mouth daily. 120 tablet 3  . lisinopril (PRINIVIL,ZESTRIL) 20 MG tablet TAKE ONE TABLET BY MOUTH ONCE DAILY 30 tablet 5  . metFORMIN (GLUCOPHAGE XR) 750 MG 24 hr tablet Take 1 tablet (750 mg total) by mouth daily with breakfast. 30 tablet 3  . potassium chloride SA (K-DUR,KLOR-CON) 20 MEQ tablet Take 1 tablet (20 mEq total) by mouth daily. 90 tablet 3  . sennosides-docusate sodium (SENOKOT-S) 8.6-50 MG tablet Take 1 tablet by mouth daily as needed. For constipation    . simvastatin (ZOCOR) 40 MG tablet Take 1 tablet by mouth in the evening - OFFICE APPOINTMENT NEEDED FOR REFILL 30 tablet 0   No current facility-administered medications for this visit.    Allergies:   Review of patient's allergies indicates no known allergies.   Social History: History   Social History  . Marital Status: Married    Spouse Name: N/A  . Number of Children: 1  . Years of Education: N/A   Occupational History  . Not on file.   Social History Main Topics  . Smoking status: Never Smoker   . Smokeless tobacco: Not on file  . Alcohol Use: Yes     Comment: Occasional  . Drug Use: No  . Sexual Activity: Yes   Other Topics Concern  . Not on file   Social History Narrative   Previously lived in [place] before moving to [place].  He has recently relocated to [place].    Family History: Family History  Problem Relation Age of Onset  . Diabetes Mother   . Hyperlipidemia Mother   . Diabetes Brother     Review of Systems: All other systems reviewed and are otherwise negative except as noted above.   Physical Exam: VS:  BP 126/84 mmHg  Pulse 60  Ht 5\' 10"  (1.778 m)  Wt 255 lb (115.667 kg)  BMI 36.59 kg/m2 , BMI Body mass index is 36.59 kg/(m^2).  Wt Readings from Last 3 Encounters:  11/07/14 255 lb (115.667 kg)  11/02/14 254 lb (115.214 kg)  10/10/14  254 lb 9.6 oz (115.486 kg)     GEN- The patient is obese appearing, alert and oriented x 3 today.   HEENT: normocephalic, atraumatic; sclera clear, conjunctiva pink; hearing intact; oropharynx clear; neck supple  Lungs- Clear to ausculation bilaterally, normal work of breathing.  No wheezes, rales, rhonchi Heart- Regular rate and rhythm, no murmurs, rubs or gallops GI- obese, non-tender, bowel sounds present Extremities- no clubbing, cyanosis, 1+ edema MS- no significant deformity or atrophy Skin- warm and dry, no rash or lesion; device pocket well healed Psych- euthymic mood, full affect Neuro- strength and sensation are intact   EKG:  EKG is not ordered today  Recent Labs: 02/20/2014: ALT 17 10/10/2014: BUN 50*; Creatinine 1.65*; Potassium 5.1; Sodium 137   Assessment and Plan:  1.  Chronic systolic heart failure Patient reports improvent status post CRT optimization.  His heart failure symptoms are currently stable. Compliance with medications encouraged BMET today  2.  HTN Stable No change required today  3.  Obesity Weight loss encouraged   Current medicines are reviewed at length with the patient today.   The patient does not have concerns regarding his medicines.  The following changes were made today:  none  Labs/ tests ordered today include: BMET  Disposition:   Follow up with Dr [name] in 6 weeks. Merlin transmissions, follow up with Dr Rayann Heman 1 year.    Signed, Chanetta Marshall, NP 11/07/2014 1:38 PM  [address] [place] [address] [phone] (office) [phone] (fax)

## 2014-11-07 NOTE — Addendum Note (Signed)
Addended by: Stanton Kidney on: 11/07/2014 02:07 PM   Modules accepted: Orders, Level of Service

## 2014-12-03 ENCOUNTER — Telehealth: Payer: Self-pay | Admitting: Cardiology

## 2014-12-03 ENCOUNTER — Ambulatory Visit (INDEPENDENT_AMBULATORY_CARE_PROVIDER_SITE_OTHER): Payer: Medicare Other | Admitting: *Deleted

## 2014-12-03 DIAGNOSIS — Z95810 Presence of automatic (implantable) cardiac defibrillator: Secondary | ICD-10-CM | POA: Diagnosis not present

## 2014-12-03 DIAGNOSIS — I5023 Acute on chronic systolic (congestive) heart failure: Secondary | ICD-10-CM

## 2014-12-03 DIAGNOSIS — I429 Cardiomyopathy, unspecified: Secondary | ICD-10-CM | POA: Diagnosis not present

## 2014-12-03 NOTE — Telephone Encounter (Signed)
LMOVM reminding pt to send remote transmission.   

## 2014-12-04 ENCOUNTER — Encounter: Payer: Self-pay | Admitting: *Deleted

## 2014-12-04 NOTE — Addendum Note (Signed)
Addended by: Alvis Lemmings C on: 12/04/2014 03:42 PM   Modules accepted: Level of Service

## 2014-12-04 NOTE — Progress Notes (Signed)
EPIC Encounter for ICM Monitoring  Patient Name: Earl Revoir Sr. is a 74 y.o. male Date: 12/04/2014 Primary Care Physican: [name], PA-C Primary Cardiologist: [name] Electrophysiologist: Allred Dry Weight: 249 lbs   Bi-V pacing: 99%      In the past month, have you:  1. Gained more than 2 pounds in a day or more than 5 pounds in a week? He states he has actually lost 7 lbs.  2. Had changes in your medications (with verification of current medications)? no  3. Had more shortness of breath than is usual for you? no  4. Limited your activity because of shortness of breath? no  5. Not been able to sleep because of shortness of breath? no  6. Had increased swelling in your feet or ankles? no  7. Had symptoms of dehydration (dizziness, dry mouth, increased thirst, decreased urine output) no  8. Had changes in sodium restriction? no  9. Been compliant with medication? Yes   ICM trend:   Follow-up plan: ICM clinic phone appointment: 01/07/15. The patient states he is feeling well today. He has changed his diet and is limiting his red meats. No changes made today.  Copy of note sent to patient's primary care physician, primary cardiologist, and device following physician.  Alvis Lemmings, RN, BSN 12/04/2014 3:38 PM

## 2014-12-04 NOTE — Progress Notes (Signed)
Remote ICD transmission.   

## 2014-12-08 LAB — CUP PACEART REMOTE DEVICE CHECK
Battery Remaining Percentage: 67 %
Brady Statistic AP VP Percent: 79 %
Brady Statistic AP VS Percent: 1 %
Brady Statistic AS VP Percent: 19 %
Brady Statistic AS VS Percent: 1 %
Brady Statistic RA Percent Paced: 80 %
Date Time Interrogation Session: 20160607105031
HighPow Impedance: 50 Ohm
HighPow Impedance: 50 Ohm
Lead Channel Impedance Value: 550 Ohm
Lead Channel Impedance Value: 990 Ohm
Lead Channel Pacing Threshold Pulse Width: 0.5 ms
Lead Channel Pacing Threshold Pulse Width: 0.5 ms
Lead Channel Sensing Intrinsic Amplitude: 11.6 mV
Lead Channel Sensing Intrinsic Amplitude: 5 mV
Lead Channel Setting Pacing Amplitude: 1.875
Lead Channel Setting Pacing Amplitude: 2.125
Lead Channel Setting Sensing Sensitivity: 0.5 mV
MDC IDC MSMT BATTERY REMAINING LONGEVITY: 65 mo
MDC IDC MSMT BATTERY VOLTAGE: 2.96 V
MDC IDC MSMT LEADCHNL LV PACING THRESHOLD AMPLITUDE: 0.75 V
MDC IDC MSMT LEADCHNL RA PACING THRESHOLD AMPLITUDE: 0.875 V
MDC IDC MSMT LEADCHNL RV IMPEDANCE VALUE: 490 Ohm
MDC IDC MSMT LEADCHNL RV PACING THRESHOLD AMPLITUDE: 1.125 V
MDC IDC MSMT LEADCHNL RV PACING THRESHOLD PULSEWIDTH: 0.5 ms
MDC IDC SET LEADCHNL LV PACING AMPLITUDE: 2 V
MDC IDC SET LEADCHNL LV PACING PULSEWIDTH: 0.5 ms
MDC IDC SET LEADCHNL RV PACING PULSEWIDTH: 0.5 ms
MDC IDC SET ZONE DETECTION INTERVAL: 400 ms
Pulse Gen Serial Number: 7070427
Zone Setting Detection Interval: 300 ms

## 2014-12-18 ENCOUNTER — Encounter: Payer: Self-pay | Admitting: Cardiology

## 2014-12-19 ENCOUNTER — Encounter: Payer: Self-pay | Admitting: Internal Medicine

## 2015-01-07 ENCOUNTER — Ambulatory Visit (INDEPENDENT_AMBULATORY_CARE_PROVIDER_SITE_OTHER): Payer: Medicare Other | Admitting: *Deleted

## 2015-01-07 DIAGNOSIS — I420 Dilated cardiomyopathy: Secondary | ICD-10-CM | POA: Diagnosis not present

## 2015-01-07 DIAGNOSIS — Z95810 Presence of automatic (implantable) cardiac defibrillator: Secondary | ICD-10-CM | POA: Diagnosis not present

## 2015-01-08 ENCOUNTER — Encounter: Payer: Self-pay | Admitting: *Deleted

## 2015-01-08 NOTE — Progress Notes (Signed)
EPIC Encounter for ICM Monitoring  Patient Name: Earl Bochicchio Sr. is a 74 y.o. male Date: 01/08/2015 Primary Care Physican: [name], PA-C Primary Cardiologist: [name] Electrophysiologist: Allred Dry Weight: 235 lbs  Bi-V pacing: 99%       In the past month, have you:  1. Gained more than 2 pounds in a day or more than 5 pounds in a week? No. The patient states he has gone on a diet and has lost down from 249 lbs to 235 lbs!! He states he feels great!  2. Had changes in your medications (with verification of current medications)? no  3. Had more shortness of breath than is usual for you? no  4. Limited your activity because of shortness of breath? no  5. Not been able to sleep because of shortness of breath? no  6. Had increased swelling in your feet or ankles? no  7. Had symptoms of dehydration (dizziness, dry mouth, increased thirst, decreased urine output) no  8. Had changes in sodium restriction? no  9. Been compliant with medication? Yes   ICM trend:   Follow-up plan: ICM clinic phone appointment: 02/11/15. The patient's corvue readings were up slightly from ~ 6/22-6/25. The patient denies any change in his symptoms. He is working on his diet and his weight is going down. No changes made today.  Copy of note sent to patient's primary care physician, primary cardiologist, and device following physician.  Alvis Lemmings, RN, BSN 01/08/2015 4:09 PM

## 2015-01-10 ENCOUNTER — Other Ambulatory Visit: Payer: Self-pay | Admitting: Physician Assistant

## 2015-01-21 NOTE — Progress Notes (Signed)
      HPI: FU congestive heart failure. Patient previously resided in [place] and then [place]. He apparently was diagnosed with a cardiomyopathy. He does not know the etiology and denies catheterization. He has had previous CRT-D. He moved here in March of 2014. Echocardiogram April 2016 showed ejection fraction 25-30%, moderate left ventricular enlargement, mild left ventricular hypertrophy, grade 1 diastolic dysfunction, mild left atrial enlargement. Since last seen,   Current Outpatient Prescriptions  Medication Sig Dispense Refill  . allopurinol (ZYLOPRIM) 100 MG tablet TAKE ONE TABLET BY MOUTH TWICE DAILY. NEED TO HAVE URIC ACID CHECKED. 60 tablet 5  . aspirin 81 MG tablet Take 81 mg by mouth daily.    . carvedilol (COREG) 25 MG tablet TAKE ONE TABLET BY MOUTH TWICE DAILY WITH MEALS 60 tablet 1  . furosemide (LASIX) 40 MG tablet Take 2 tablets (80 mg total) by mouth daily. 120 tablet 3  . lisinopril (PRINIVIL,ZESTRIL) 20 MG tablet TAKE ONE TABLET BY MOUTH ONCE DAILY 30 tablet 5  . metFORMIN (GLUCOPHAGE XR) 750 MG 24 hr tablet Take 1 tablet (750 mg total) by mouth daily with breakfast. 30 tablet 3  . potassium chloride SA (K-DUR,KLOR-CON) 20 MEQ tablet Take 1 tablet (20 mEq total) by mouth daily. 90 tablet 3  . sennosides-docusate sodium (SENOKOT-S) 8.6-50 MG tablet Take 1 tablet by mouth daily as needed. For constipation    . simvastatin (ZOCOR) 40 MG tablet Take 1 tablet by mouth in the evening - OFFICE APPOINTMENT NEEDED FOR REFILL 30 tablet 0   No current facility-administered medications for this visit.     Past Medical History  Diagnosis Date  . CHF (congestive heart failure)   . Hypertension   . Diabetes mellitus     Type II. Diet controlled  . Hyperlipidemia   . BPH (benign prostatic hyperplasia)   . Gout   . Nonischemic cardiomyopathy     a. s/p STJ CRTD    Past Surgical History  Procedure Laterality Date  . Cardiac defibrillator placement  Jan 2014   SJM Quadra Assura implanted by Dr [name] in [place]    History   Social History  . Marital Status: Married    Spouse Name: N/A  . Number of Children: 1  . Years of Education: N/A   Occupational History  . Not on file.   Social History Main Topics  . Smoking status: Never Smoker   . Smokeless tobacco: Not on file  . Alcohol Use: Yes     Comment: Occasional  . Drug Use: No  . Sexual Activity: Yes   Other Topics Concern  . Not on file   Social History Narrative   Previously lived in [place] before moving to [place].  He has recently relocated to [place].    ROS: no fevers or chills, productive cough, hemoptysis, dysphasia, odynophagia, melena, hematochezia, dysuria, hematuria, rash, seizure activity, orthopnea, PND, pedal edema, claudication. Remaining systems are negative.  Physical Exam: Well-developed well-nourished in no acute distress.  Skin is warm and dry.  HEENT is normal.  Neck is supple.  Chest is clear to auscultation with normal expansion.  Cardiovascular exam is regular rate and rhythm.  Abdominal exam nontender or distended. No masses palpated. Extremities show no edema. neuro grossly intact  ECG     This encounter was created in error - please disregard.

## 2015-01-22 ENCOUNTER — Encounter: Payer: Medicare Other | Admitting: Cardiology

## 2015-01-23 ENCOUNTER — Encounter: Payer: Self-pay | Admitting: Cardiology

## 2015-02-11 ENCOUNTER — Telehealth: Payer: Self-pay | Admitting: Cardiology

## 2015-02-11 ENCOUNTER — Ambulatory Visit (INDEPENDENT_AMBULATORY_CARE_PROVIDER_SITE_OTHER): Payer: Medicare Other | Admitting: *Deleted

## 2015-02-11 DIAGNOSIS — Z95810 Presence of automatic (implantable) cardiac defibrillator: Secondary | ICD-10-CM | POA: Diagnosis not present

## 2015-02-11 DIAGNOSIS — I420 Dilated cardiomyopathy: Secondary | ICD-10-CM

## 2015-02-11 NOTE — Telephone Encounter (Signed)
LMOVM reminding pt to send remote transmission.   

## 2015-02-14 ENCOUNTER — Telehealth: Payer: Self-pay

## 2015-02-14 ENCOUNTER — Encounter: Payer: Self-pay | Admitting: Cardiology

## 2015-02-14 NOTE — Telephone Encounter (Signed)
ICM transmission received.  Attempted call to home and cell phone.  Message left on home phone, unable to leave message on cell

## 2015-02-15 NOTE — Telephone Encounter (Signed)
Spoke with patient.

## 2015-02-15 NOTE — Progress Notes (Addendum)
EPIC Encounter for ICM Monitoring  Patient Name: Earl Hijazi Sr. is a 74 y.o. male Date: 02/15/2015 Primary Care Physican: [name], PA-C Primary Cardiologist: [name] Electrophysiologist: Allred Dry Weight: 232 lbs       Bi-V Pacing 99%  In the past month, have you:  1. Gained more than 2 pounds in a day or more than 5 pounds in a week? no  2. Had changes in your medications (with verification of current medications)? no  3. Had more shortness of breath than is usual for you? no  4. Limited your activity because of shortness of breath? no  5. Not been able to sleep because of shortness of breath? no  6. Had increased swelling in your feet or ankles? no  7. Had symptoms of dehydration (dizziness, dry mouth, increased thirst, decreased urine output) no  8. Had changes in sodium restriction? no  9. Been compliant with medication? Yes   ICM trend:   Follow-up plan: ICM clinic phone appointment on 03/19/2015.  Corvue impedance shows below baseline from 02/11/2015 to 02/14/2015 and patient denied any symptoms.  He reported he was out of town visiting family during that time and was off his diet.  No changes today.  Copy of note sent to patient's primary care physician, primary cardiologist, and device following physician.   Rosalene Billings, RN, CCM 02/15/2015 3:06 PM        02/18/2015  Received reply  message from Dr Rayann Heman regarding follow up:  Repeat Corvue in a week and if trending down would give additional Lasix for several days.  If he is correcting on his own can disregard.

## 2015-02-18 ENCOUNTER — Telehealth: Payer: Self-pay

## 2015-02-18 NOTE — Telephone Encounter (Signed)
Call to patient.  Requested to send repeat Corvue this week.  Explained Dr Rayann Heman has reviewed the transmission and wanted to see updated fluid levels.  He stated he will be home on Wednesday and will transmit again.

## 2015-02-20 ENCOUNTER — Ambulatory Visit: Payer: Medicare Other | Admitting: Physician Assistant

## 2015-02-21 ENCOUNTER — Telehealth: Payer: Self-pay

## 2015-02-21 ENCOUNTER — Encounter: Payer: Medicare Other | Admitting: *Deleted

## 2015-02-21 NOTE — Telephone Encounter (Signed)
Attempted call to patient for reminder to send manual ICM transmission.  Left message on home phone for return call with number.  Unable to leave message on cell phone.

## 2015-02-22 NOTE — Telephone Encounter (Signed)
Call to patient.  Reminded him to send a repeat CorVue transmission today to check fluid levels.

## 2015-02-23 ENCOUNTER — Other Ambulatory Visit: Payer: Self-pay | Admitting: Physician Assistant

## 2015-02-26 NOTE — Telephone Encounter (Signed)
Repeat ICM transmission not sent by patient.  Will follow up next month.

## 2015-03-08 ENCOUNTER — Telehealth: Payer: Self-pay | Admitting: Physician Assistant

## 2015-03-08 ENCOUNTER — Ambulatory Visit (INDEPENDENT_AMBULATORY_CARE_PROVIDER_SITE_OTHER): Payer: Medicare Other | Admitting: Physician Assistant

## 2015-03-08 ENCOUNTER — Encounter: Payer: Self-pay | Admitting: Physician Assistant

## 2015-03-08 VITALS — BP 145/78 | HR 66 | Ht 70.0 in | Wt 238.0 lb

## 2015-03-08 DIAGNOSIS — N529 Male erectile dysfunction, unspecified: Secondary | ICD-10-CM

## 2015-03-08 DIAGNOSIS — M1000 Idiopathic gout, unspecified site: Secondary | ICD-10-CM | POA: Diagnosis not present

## 2015-03-08 DIAGNOSIS — E119 Type 2 diabetes mellitus without complications: Secondary | ICD-10-CM

## 2015-03-08 DIAGNOSIS — Z13220 Encounter for screening for lipoid disorders: Secondary | ICD-10-CM

## 2015-03-08 DIAGNOSIS — Z79899 Other long term (current) drug therapy: Secondary | ICD-10-CM | POA: Diagnosis not present

## 2015-03-08 DIAGNOSIS — Z1211 Encounter for screening for malignant neoplasm of colon: Secondary | ICD-10-CM

## 2015-03-08 DIAGNOSIS — R972 Elevated prostate specific antigen [PSA]: Secondary | ICD-10-CM

## 2015-03-08 LAB — POCT GLYCOSYLATED HEMOGLOBIN (HGB A1C): Hemoglobin A1C: 6.7

## 2015-03-08 MED ORDER — METFORMIN HCL ER 750 MG PO TB24: 750.0000 mg | ORAL_TABLET | Freq: Every day | ORAL | Status: DC

## 2015-03-08 NOTE — Progress Notes (Signed)
   Subjective:    Patient ID: Earl Hatcher Sr., male    DOB: 01/20/1941, 74 y.o.   MRN: [id number]  HPI  Pt presents to the clinic for medication refill and 3 month follow up.   DM- doing great. Working out and lost 20lbs. Very happy has started dating again since dealth of his wife. On metformin daily. Not checking sugars. Keeping to a low carb/sugar diet. No hypoglyemic events.   Pt request viagra today. He has started to be sexually active again. He is having trouble getting and maintaining erection.   Gout controlled with allopurinol daily. No recent exacerbations.     Review of Systems  All other systems reviewed and are negative.      Objective:   Physical Exam  Constitutional: He is oriented to person, place, and time. He appears well-developed and well-nourished.  HENT:  Head: Normocephalic and atraumatic.  Cardiovascular: Normal rate, regular rhythm and normal heart sounds.   Pulmonary/Chest: Effort normal and breath sounds normal.  Neurological: He is alert and oriented to person, place, and time.  Psychiatric: He has a normal mood and affect. His behavior is normal.          Assessment & Plan:  DM, type II, controlled- .[name] Lab Results  Component Value Date   HGBA1C 6.7 03/08/2015   Looks great.  Continue metformin refilled for 3 months. Pt is losing weight and eating healthier. Keep up the good work.   Vaccines up to date.  On ACE.  ED-likely due to cardiomyopathy/DM/BPH. pt is followed by cardiology. Will get verbal approval to treat ED before proceeding.   Cardiomyopathy- managed by cardiology.   Hyperlipidemia- recheck today.   Gout- will check uric acid. Controlled and refilled alloupurinol.   Pt aware needs CPe. Declined colonoscopy. Gave hemoccult to return and test in office.

## 2015-03-10 DIAGNOSIS — N529 Male erectile dysfunction, unspecified: Secondary | ICD-10-CM | POA: Insufficient documentation

## 2015-03-10 MED ORDER — SILDENAFIL CITRATE 100 MG PO TABS: 100.0000 mg | ORAL_TABLET | ORAL | Status: DC | PRN

## 2015-03-10 NOTE — Telephone Encounter (Signed)
Dr. [name] is ok with ED medication. Will send to pharmacy to try 30 minutes before intercourse. i sent 100mg  try half tab first at 50mg  to see if works.

## 2015-03-11 NOTE — Telephone Encounter (Signed)
Pt notified of new rx & instructions on using it.

## 2015-03-12 DIAGNOSIS — Z79899 Other long term (current) drug therapy: Secondary | ICD-10-CM | POA: Diagnosis not present

## 2015-03-12 DIAGNOSIS — E119 Type 2 diabetes mellitus without complications: Secondary | ICD-10-CM | POA: Diagnosis not present

## 2015-03-12 DIAGNOSIS — M1000 Idiopathic gout, unspecified site: Secondary | ICD-10-CM | POA: Diagnosis not present

## 2015-03-12 DIAGNOSIS — Z13220 Encounter for screening for lipoid disorders: Secondary | ICD-10-CM | POA: Diagnosis not present

## 2015-03-13 LAB — COMPLETE METABOLIC PANEL WITH GFR
ALT: 14 U/L (ref 9–46)
AST: 20 U/L (ref 10–35)
Albumin: 4.2 g/dL (ref 3.6–5.1)
Alkaline Phosphatase: 55 U/L (ref 40–115)
BUN: 18 mg/dL (ref 7–25)
CALCIUM: 9.6 mg/dL (ref 8.6–10.3)
CO2: 27 mmol/L (ref 20–31)
CREATININE: 0.97 mg/dL (ref 0.70–1.18)
Chloride: 102 mmol/L (ref 98–110)
GFR, EST AFRICAN AMERICAN: 89 mL/min (ref >=60)
GFR, Est Non African American: 77 mL/min (ref >=60)
Glucose, Bld: 125 mg/dL — ABNORMAL HIGH (ref 65–99)
Potassium: 4.7 mmol/L (ref 3.5–5.3)
Sodium: 142 mmol/L (ref 135–146)
Total Bilirubin: 0.7 mg/dL (ref 0.2–1.2)
Total Protein: 6.9 g/dL (ref 6.1–8.1)

## 2015-03-13 LAB — LIPID PANEL
CHOLESTEROL: 149 mg/dL (ref 125–200)
HDL: 40 mg/dL (ref >=40)
LDL Cholesterol: 89 mg/dL (ref <130)
TRIGLYCERIDES: 98 mg/dL (ref <150)
Total CHOL/HDL Ratio: 3.7 Ratio (ref <=5.0)
VLDL: 20 mg/dL (ref <30)

## 2015-03-13 LAB — URIC ACID: URIC ACID, SERUM: 10.4 mg/dL — AB (ref 4.0–7.8)

## 2015-03-19 ENCOUNTER — Telehealth: Payer: Self-pay

## 2015-03-19 ENCOUNTER — Ambulatory Visit (INDEPENDENT_AMBULATORY_CARE_PROVIDER_SITE_OTHER): Payer: Medicare Other | Admitting: *Deleted

## 2015-03-19 ENCOUNTER — Encounter: Payer: Self-pay | Admitting: Internal Medicine

## 2015-03-19 DIAGNOSIS — I429 Cardiomyopathy, unspecified: Secondary | ICD-10-CM | POA: Diagnosis not present

## 2015-03-19 NOTE — Telephone Encounter (Signed)
ICM transmission received.  Attempted call to home and rang several times and disconnected after sounding like it was answered.  Attempted call to cell number and person answering phone stated he was not home.

## 2015-03-19 NOTE — Progress Notes (Signed)
Remote ICD transmission.   

## 2015-03-25 LAB — CUP PACEART REMOTE DEVICE CHECK
Battery Remaining Longevity: 60 mo
Brady Statistic AP VP Percent: 86 %
Brady Statistic AP VS Percent: 1 %
Brady Statistic AS VP Percent: 13 %
Brady Statistic AS VS Percent: 1 %
Brady Statistic RA Percent Paced: 86 %
Date Time Interrogation Session: 20160920122648
HIGH POWER IMPEDANCE MEASURED VALUE: 46 Ohm
HighPow Impedance: 46 Ohm
Lead Channel Impedance Value: 490 Ohm
Lead Channel Impedance Value: 900 Ohm
Lead Channel Pacing Threshold Amplitude: 0.75 V
Lead Channel Pacing Threshold Pulse Width: 0.5 ms
Lead Channel Sensing Intrinsic Amplitude: 5 mV
Lead Channel Setting Pacing Amplitude: 1.75 V
Lead Channel Setting Pacing Amplitude: 2 V
Lead Channel Setting Sensing Sensitivity: 0.5 mV
MDC IDC MSMT BATTERY REMAINING PERCENTAGE: 63 %
MDC IDC MSMT BATTERY VOLTAGE: 2.95 V
MDC IDC MSMT LEADCHNL LV PACING THRESHOLD AMPLITUDE: 0.75 V
MDC IDC MSMT LEADCHNL RA PACING THRESHOLD PULSEWIDTH: 0.5 ms
MDC IDC MSMT LEADCHNL RV IMPEDANCE VALUE: 440 Ohm
MDC IDC MSMT LEADCHNL RV PACING THRESHOLD AMPLITUDE: 1.25 V
MDC IDC MSMT LEADCHNL RV PACING THRESHOLD PULSEWIDTH: 0.5 ms
MDC IDC MSMT LEADCHNL RV SENSING INTR AMPL: 11.6 mV
MDC IDC SET LEADCHNL LV PACING PULSEWIDTH: 0.5 ms
MDC IDC SET LEADCHNL RV PACING AMPLITUDE: 2.25 V
MDC IDC SET LEADCHNL RV PACING PULSEWIDTH: 0.5 ms
Pulse Gen Serial Number: 7070427
Zone Setting Detection Interval: 300 ms
Zone Setting Detection Interval: 400 ms

## 2015-03-26 NOTE — Telephone Encounter (Signed)
Unable to reach member for ICM follow up.  Next transmission scheduled for 04/10/2015.

## 2015-03-29 ENCOUNTER — Encounter: Payer: Self-pay | Admitting: Cardiology

## 2015-03-30 ENCOUNTER — Other Ambulatory Visit: Payer: Self-pay | Admitting: Physician Assistant

## 2015-04-10 ENCOUNTER — Telehealth: Payer: Self-pay | Admitting: Cardiology

## 2015-04-10 NOTE — Telephone Encounter (Signed)
LMOVM reminding pt to send remote transmission.   

## 2015-04-12 ENCOUNTER — Ambulatory Visit (INDEPENDENT_AMBULATORY_CARE_PROVIDER_SITE_OTHER): Payer: Medicare Other

## 2015-04-12 DIAGNOSIS — Z95810 Presence of automatic (implantable) cardiac defibrillator: Secondary | ICD-10-CM

## 2015-04-12 DIAGNOSIS — I5023 Acute on chronic systolic (congestive) heart failure: Secondary | ICD-10-CM | POA: Diagnosis not present

## 2015-04-15 NOTE — Progress Notes (Addendum)
EPIC Encounter for ICM Monitoring  Patient Name: Earl Prieto Sr. is a 74 y.o. male Date: 04/15/2015 Primary Care Physican: [name], PA-C Primary Cardiologist: [name] Electrophysiologist: Allred Dry Weight: 238 lbs       Bi-V Pacing 99%  In the past month, have you:  1. Gained more than 2 pounds in a day or more than 5 pounds in a week? No, but has gained 5 lbs within 2 weeks  2. Had changes in your medications (with verification of current medications)? no  3. Had more shortness of breath than is usual for you? no  4. Limited your activity because of shortness of breath? no  5. Not been able to sleep because of shortness of breath? no  6. Had increased swelling in your feet or ankles? no  7. Had symptoms of dehydration (dizziness, dry mouth, increased thirst, decreased urine output) no  8. Had changes in sodium restriction? no  9. Been compliant with medication? No, he has been traveling and does not always take the Furosemide due to frequent stops to the bathroom.  He missed a couple of doses in the last week.    ICM trend: 04/13/2015     Follow-up plan: ICM clinic phone appointment 04/22/2015.  Corvue impedance below baseline 04/12/2015 to transmission 04/13/2015.  He reported he has been traveling and was driving for 7 hours and does not take Furosemide as ordered until he gets to his destination.  Also eating foods higher in sodium when traveling.  He thinks the 5 lb weight gain is due to the amount of food he has been eating.          Patient is asymptomatic and denies weight gain is related to fluid.  Recommended to take extra Lasix 40mg  - 1 tablet and Potassium 20 meq - 1 tablet every day x 3 days in addition to his       prescribed dose of Lasix 80mg  qd and Potassium 20 meq daily.  He verbalized understanding.   03/12/2015 BUN and Creatinine were within normal limits.  Repeat Corvue on 04/22/2015.   Advised would forward for physician review and will call  back if any further recommendations.    Copy of note sent to patient's primary care physician, primary cardiologist, and device following physician.  Rosalene Billings, RN, CCM 04/15/2015 3:22 PM

## 2015-04-17 ENCOUNTER — Other Ambulatory Visit: Payer: Self-pay | Admitting: Physician Assistant

## 2015-04-22 ENCOUNTER — Telehealth: Payer: Self-pay | Admitting: Cardiology

## 2015-04-22 NOTE — Telephone Encounter (Signed)
LMOVM reminding pt to send remote transmission.   

## 2015-04-25 NOTE — Progress Notes (Signed)
Patient ID: Earl Delker Sr., male   DOB: [birth year], 74 y.o.   MRN: [id number]  Patient scheduled for repeat Corvue ICM transmission for follow up on fluid retention from 04/13/2015 transmission.  No transmission received and rescheduled for 05/14/2015.

## 2015-04-29 ENCOUNTER — Other Ambulatory Visit: Payer: Self-pay | Admitting: Physician Assistant

## 2015-05-14 ENCOUNTER — Telehealth: Payer: Self-pay | Admitting: Cardiology

## 2015-05-14 ENCOUNTER — Ambulatory Visit (INDEPENDENT_AMBULATORY_CARE_PROVIDER_SITE_OTHER): Payer: Medicare Other

## 2015-05-14 DIAGNOSIS — Z95810 Presence of automatic (implantable) cardiac defibrillator: Secondary | ICD-10-CM

## 2015-05-14 DIAGNOSIS — I5023 Acute on chronic systolic (congestive) heart failure: Secondary | ICD-10-CM

## 2015-05-14 NOTE — Telephone Encounter (Signed)
LMOVM reminding pt to send remote transmission.   

## 2015-05-15 NOTE — Progress Notes (Signed)
EPIC Encounter for ICM Monitoring  Patient Name: Earl Litwak Sr. is a 74 y.o. male Date: 05/15/2015 Primary Care Physican: [name], PA-C Primary Cardiologist: [name] Electrophysiologist: Allred Dry Weight: 238 lb       In the past month, have you:  1. Gained more than 2 pounds in a day or more than 5 pounds in a week? no  2. Had changes in your medications (with verification of current medications)? no  3. Had more shortness of breath than is usual for you? Yes, during 05/02/2015 to 05/08/2015 and 05/14/2015 due to not following low sodium diet.  His breathing has returned to his normal.   4. Limited your activity because of shortness of breath? no  5. Not been able to sleep because of shortness of breath? no  6. Had increased swelling in your feet or ankles? no  7. Had symptoms of dehydration (dizziness, dry mouth, increased thirst, decreased urine output) no  8. Had changes in sodium restriction? Yes, did not follow low sodium diet due to being on vacation.    9. Been compliant with medication? Yes, but takes Furosemide late in the day when driving and traveling   ICM trend: 05/14/2015   Follow-up plan: ICM clinic phone appointment on 06/17/2015.  Corvue daily impedance below baseline ~05/02/2015 to 05/09/2015 and 05/10/2015 to 05/14/2015 and returned back to baseline today.  Patient reported he had been on vacation during those days and had some increase in shortness of breath, 2 lb weight gain and fatigue but that has now resolved.  He reported he is home and now back on his diet.  He denied any symptoms today.  No changes today and encouraged him to call for any HF symptoms.   Copy of note sent to patient's primary care physician, primary cardiologist, and device following physician.  Rosalene Billings, RN, CCM 05/15/2015 8:53 AM

## 2015-05-29 ENCOUNTER — Other Ambulatory Visit: Payer: Self-pay | Admitting: Family Medicine

## 2015-06-07 ENCOUNTER — Ambulatory Visit: Payer: Medicare Other | Admitting: Physician Assistant

## 2015-06-11 NOTE — Progress Notes (Signed)
HPI: FU congestive heart failure. Patient previously resided in [place] and then [place]. He apparently was diagnosed with a cardiomyopathy. He does not know the etiology and denies catheterization. He has had previous CRT-D. He moved here in March of 2014. Echocardiogram in April 2016 showed EF 123456, grade 1 diastolic dysfunction, mild LAE. Since last seen,   Current Outpatient Prescriptions  Medication Sig Dispense Refill  . allopurinol (ZYLOPRIM) 100 MG tablet TAKE ONE TABLET BY MOUTH TWICE DAILY. NEED TO HAVE URIC ACID CHECKED. 60 tablet 5  . aspirin 81 MG tablet Take 81 mg by mouth daily.    . carvedilol (COREG) 25 MG tablet Take 1 tablet (25 mg total) by mouth 2 (two) times daily with a meal. APPOINTMENT NEEDED FOR FURTHER REFILLS 60 tablet 0  . furosemide (LASIX) 40 MG tablet Take 2 tablets (80 mg total) by mouth daily. 120 tablet 3  . lisinopril (PRINIVIL,ZESTRIL) 20 MG tablet TAKE ONE TABLET BY MOUTH ONCE DAILY 30 tablet 2  . metFORMIN (GLUCOPHAGE XR) 750 MG 24 hr tablet Take 1 tablet (750 mg total) by mouth daily with breakfast. 30 tablet 3  . potassium chloride SA (K-DUR,KLOR-CON) 20 MEQ tablet Take 1 tablet (20 mEq total) by mouth daily. 90 tablet 3  . sennosides-docusate sodium (SENOKOT-S) 8.6-50 MG tablet Take 1 tablet by mouth daily as needed. For constipation    . sildenafil (VIAGRA) 100 MG tablet Take 1 tablet (100 mg total) by mouth as needed for erectile dysfunction (for use prior to sexual activity). 30 tablet 2  . simvastatin (ZOCOR) 40 MG tablet TAKE ONE TABLET BY MOUTH ONCE DAILY AT 6 PM.PATINT NEEDS TO SCHEDULE A FOLLOW UP APPOINTMENT BEFORE MORE REFILLS.NEED LAB WORK. 90 tablet 3   No current facility-administered medications for this visit.     Past Medical History  Diagnosis Date  . CHF (congestive heart failure)   . Hypertension   . Diabetes mellitus     Type II. Diet controlled  . Hyperlipidemia   . BPH (benign prostatic hyperplasia)   .  Gout   . Nonischemic cardiomyopathy ([place])     a. s/p STJ CRTD    Past Surgical History  Procedure Laterality Date  . Cardiac defibrillator placement  Jan 2014    SJM Quadra Assura implanted by Dr [name] in [place] History  . Marital Status: Married    Spouse Name: N/A  . Number of Children: 1  . Years of Education: N/A   Occupational History  . Not on file.   Social History Main Topics  . Smoking status: Never Smoker   . Smokeless tobacco: Not on file  . Alcohol Use: Yes     Comment: Occasional  . Drug Use: No  . Sexual Activity: Yes   Other Topics Concern  . Not on file   Social History Narrative   Previously lived in [place] before moving to [place].  He has recently relocated to [place].    ROS: no fevers or chills, productive cough, hemoptysis, dysphasia, odynophagia, melena, hematochezia, dysuria, hematuria, rash, seizure activity, orthopnea, PND, pedal edema, claudication. Remaining systems are negative.  Physical Exam: Well-developed well-nourished in no acute distress.  Skin is warm and dry.  HEENT is normal.  Neck is supple.  Chest is clear to auscultation with normal expansion.  Cardiovascular exam is regular rate and rhythm.  Abdominal exam nontender or distended. No masses palpated. Extremities show no edema.  neuro grossly intact  ECG     This encounter was created in error - please disregard.

## 2015-06-12 ENCOUNTER — Encounter: Payer: Medicare Other | Admitting: Cardiology

## 2015-06-12 DIAGNOSIS — R0989 Other specified symptoms and signs involving the circulatory and respiratory systems: Secondary | ICD-10-CM

## 2015-06-17 ENCOUNTER — Telehealth: Payer: Self-pay | Admitting: Cardiology

## 2015-06-17 NOTE — Telephone Encounter (Signed)
Spoke with pt and reminded pt of remote transmission that is due today. Pt verbalized understanding.   

## 2015-06-19 ENCOUNTER — Telehealth: Payer: Self-pay

## 2015-06-19 NOTE — Telephone Encounter (Signed)
ICM transmission received.  Attempted patient call and left message for return call.  

## 2015-06-28 NOTE — Telephone Encounter (Signed)
Unable to reach patient for ICM follow up.  Next remote ICM transmission scheduled for 08/12/2015 and patient has in office defib check with Dr Rayann Heman on 07/10/2015.   Patient letter sent with new transmission date.   FYI for Dr [name] and Dr Rayann Heman   ICM trend:  06/18/2015

## 2015-07-10 ENCOUNTER — Encounter: Payer: Self-pay | Admitting: Internal Medicine

## 2015-07-10 ENCOUNTER — Ambulatory Visit (INDEPENDENT_AMBULATORY_CARE_PROVIDER_SITE_OTHER): Payer: Medicare Other | Admitting: Internal Medicine

## 2015-07-10 VITALS — BP 118/78 | HR 62 | Ht 69.0 in | Wt 250.0 lb

## 2015-07-10 DIAGNOSIS — I509 Heart failure, unspecified: Secondary | ICD-10-CM | POA: Diagnosis not present

## 2015-07-10 DIAGNOSIS — I10 Essential (primary) hypertension: Secondary | ICD-10-CM

## 2015-07-10 DIAGNOSIS — I5023 Acute on chronic systolic (congestive) heart failure: Secondary | ICD-10-CM

## 2015-07-10 DIAGNOSIS — I5022 Chronic systolic (congestive) heart failure: Secondary | ICD-10-CM | POA: Diagnosis not present

## 2015-07-10 DIAGNOSIS — I429 Cardiomyopathy, unspecified: Secondary | ICD-10-CM

## 2015-07-10 DIAGNOSIS — R0602 Shortness of breath: Secondary | ICD-10-CM

## 2015-07-10 DIAGNOSIS — I420 Dilated cardiomyopathy: Secondary | ICD-10-CM | POA: Diagnosis not present

## 2015-07-10 LAB — BASIC METABOLIC PANEL
BUN: 19 mg/dL (ref 7–25)
CALCIUM: 9.6 mg/dL (ref 8.6–10.3)
CO2: 27 mmol/L (ref 20–31)
Chloride: 104 mmol/L (ref 98–110)
Creat: 1.14 mg/dL (ref 0.70–1.18)
GLUCOSE: 114 mg/dL — AB (ref 65–99)
POTASSIUM: 4.3 mmol/L (ref 3.5–5.3)
Sodium: 141 mmol/L (ref 135–146)

## 2015-07-10 LAB — CUP PACEART INCLINIC DEVICE CHECK
Battery Remaining Longevity: 56.4
Brady Statistic RV Percent Paced: 99 %
Date Time Interrogation Session: 20170111145418
HIGH POWER IMPEDANCE MEASURED VALUE: 49.1727
HIGH POWER IMPEDANCE MEASURED VALUE: 49.1727
Implantable Lead Implant Date: 20140117
Implantable Lead Location: 753859
Lead Channel Impedance Value: 487.5 Ohm
Lead Channel Impedance Value: 487.5 Ohm
Lead Channel Pacing Threshold Amplitude: 0.75 V
Lead Channel Pacing Threshold Amplitude: 1.125 V
Lead Channel Pacing Threshold Pulse Width: 0.5 ms
Lead Channel Pacing Threshold Pulse Width: 0.5 ms
Lead Channel Pacing Threshold Pulse Width: 0.5 ms
Lead Channel Setting Pacing Amplitude: 1.75 V
Lead Channel Setting Pacing Amplitude: 2 V
Lead Channel Setting Pacing Amplitude: 2.125
Lead Channel Setting Pacing Pulse Width: 0.5 ms
Lead Channel Setting Pacing Pulse Width: 0.5 ms
Lead Channel Setting Sensing Sensitivity: 0.5 mV
MDC IDC LEAD IMPLANT DT: 20140117
MDC IDC LEAD IMPLANT DT: 20140117
MDC IDC LEAD LOCATION: 753858
MDC IDC LEAD LOCATION: 753860
MDC IDC MSMT LEADCHNL LV IMPEDANCE VALUE: 912.5 Ohm
MDC IDC MSMT LEADCHNL LV IMPEDANCE VALUE: 912.5 Ohm
MDC IDC MSMT LEADCHNL RA IMPEDANCE VALUE: 512.5 Ohm
MDC IDC MSMT LEADCHNL RA IMPEDANCE VALUE: 512.5 Ohm
MDC IDC MSMT LEADCHNL RA PACING THRESHOLD AMPLITUDE: 0.75 V
MDC IDC MSMT LEADCHNL RA SENSING INTR AMPL: 5 mV
MDC IDC MSMT LEADCHNL RV SENSING INTR AMPL: 11.6 mV
MDC IDC STAT BRADY RA PERCENT PACED: 86 %
Pulse Gen Serial Number: 7070427

## 2015-07-10 MED ORDER — SIMVASTATIN 40 MG PO TABS
40.0000 mg | ORAL_TABLET | Freq: Every day | ORAL | Status: DC
Start: 2015-07-10 — End: 2016-06-28

## 2015-07-10 MED ORDER — FUROSEMIDE 40 MG PO TABS: ORAL_TABLET | ORAL | Status: DC

## 2015-07-10 MED ORDER — LISINOPRIL 20 MG PO TABS: 20.0000 mg | ORAL_TABLET | Freq: Every day | ORAL | Status: DC

## 2015-07-10 NOTE — Progress Notes (Signed)
Electrophysiology Office Note   Date:  07/10/2015   ID:  Earl Hatcher Sr., DOB Jun 20, 1941, MRN [id number]  PCP:  [name], PA-C  Cardiologist:  Dr [name] Primary Electrophysiologist: Thompson Grayer, MD    Chief Complaint  Patient presents with  . Chronic systolic CHF  . Congestive dilated cardiomyopathy     History of Present Illness: Earl Ueland Sr. is a 75 y.o. male who presents today for electrophysiology evaluation.  He has had several visits with the device clinic for device optimization and is also followed closely in ICM device clinic with Sharman Cheek.  He was seen last by Chanetta Marshall NP 5/16 and was supposed to follow-up with Dr [name] within 6 weeks.  He still has not seen Dr [name].  He reports dietary noncompliance over the holidays.  With this, his SOB has worsened.  He recently discussed with Sharman Cheek and his lasix was increased to 80mg  qam and 4mg  qpm (from 80mg  daily).  This has helped his swelling.  He remains SOB with moderate activity.  Today, he  denies symptoms of palpitations,  PND, dizziness, presyncope, syncope, or neurologic sequela.  The patientis tolerating medications without difficulties and is otherwise without complaint today.    Past Medical History  Diagnosis Date  . CHF (congestive heart failure) ([place])   . Hypertension   . Diabetes mellitus ([place])     Type II. Diet controlled  . Hyperlipidemia   . BPH (benign prostatic hyperplasia)   . Gout   . Nonischemic cardiomyopathy ([place])     a. s/p STJ CRTD   Past Surgical History  Procedure Laterality Date  . Cardiac defibrillator placement  Jan 2014    SJM Quadra Assura implanted by Dr [name] in [place]     Current Outpatient Prescriptions  Medication Sig Dispense Refill  . allopurinol (ZYLOPRIM) 100 MG tablet TAKE ONE TABLET BY MOUTH TWICE DAILY. NEED TO HAVE URIC ACID CHECKED. 60 tablet 5  . aspirin 81 MG tablet Take 81 mg by mouth daily.    . carvedilol (COREG) 25  MG tablet Take 25 mg by mouth 2 (two) times daily with a meal.    . furosemide (LASIX) 40 MG tablet Take 2 tablets by mouth every morning and 1 tablet by mouth at 3 pm 270 tablet 3  . lisinopril (PRINIVIL,ZESTRIL) 20 MG tablet Take 1 tablet (20 mg total) by mouth daily. 90 tablet 3  . metFORMIN (GLUCOPHAGE XR) 750 MG 24 hr tablet Take 1 tablet (750 mg total) by mouth daily with breakfast. 30 tablet 3  . potassium chloride SA (K-DUR,KLOR-CON) 20 MEQ tablet Take 1 tablet (20 mEq total) by mouth daily. 90 tablet 3  . sennosides-docusate sodium (SENOKOT-S) 8.6-50 MG tablet Take 1 tablet by mouth daily as needed. For constipation    . sildenafil (VIAGRA) 100 MG tablet Take 1 tablet (100 mg total) by mouth as needed for erectile dysfunction (for use prior to sexual activity). 30 tablet 2  . simvastatin (ZOCOR) 40 MG tablet Take 1 tablet (40 mg total) by mouth daily. 90 tablet 3   No current facility-administered medications for this visit.    Allergies:   Review of patient's allergies indicates no known allergies.   Social History:  The patient  reports that he has never smoked. He does not have any smokeless tobacco history on file. He reports that he drinks alcohol. He reports that he does not use illicit drugs.   Family History:  The patient's family  history includes Diabetes in his brother and mother; Hyperlipidemia in his mother.    ROS:  Please see the history of present illness.   All other systems are reviewed and negative.    PHYSICAL EXAM: VS:  BP 118/78 mmHg  Pulse 62  Ht 5\' 9"  (1.753 m)  Wt 250 lb (113.399 kg)  BMI 36.90 kg/m2 , BMI Body mass index is 36.9 kg/(m^2). GEN: Well nourished, well developed, in no acute distress HEENT: normal Neck: + JVD, carotid bruits, or masses Cardiac: RRR; no murmurs, rubs, or gallops,+1 edema  Respiratory:  clear to auscultation bilaterally, normal work of breathing GI: soft, nontender, + distended, + BS MS: no deformity or atrophy Skin: warm  and dry, device pocket is well healed Neuro:  Strength and sensation are intact Psych: euthymic mood, full affect  EKG:  EKG is ordered today. The ekg ordered today shows AV paced, QRS 174 msec  Device interrogation is reviewed today in detail.  See PaceArt for details.   Recent Labs: 03/12/2015: ALT 14; BUN 18; Creat 0.97; Potassium 4.7; Sodium 142    Lipid Panel     Component Value Date/Time   CHOL 149 03/12/2015 1200   TRIG 98 03/12/2015 1200   HDL 40 03/12/2015 1200   CHOLHDL 3.7 03/12/2015 1200   VLDL 20 03/12/2015 1200   LDLCALC 89 03/12/2015 1200     Wt Readings from Last 3 Encounters:  07/10/15 250 lb (113.399 kg)  03/08/15 238 lb (107.956 kg)  11/07/14 255 lb (115.667 kg)      Other studies Reviewed: Additional studies/ records that were reviewed today include: Medical illustrator notes   ASSESSMENT AND PLAN:  1. Acute on chronic systolic dysfunction The patient has had longstanding difficulty with volume overload.  I think that it would be best to continue lasix 80mg  qam and 40mg  q 3pm going forward bmet and BNP are ordered today 2 gram sodium diet is encouraged I have encouraged him to follow-up with Dr [name].  I would favor switching ace inhibitor to entresto but would like for Dr [name] to be a part of this decision.  We did discuss entresto today and he would be interested.  Normal BiV ICD function See Pace Art report No changes today   He will continue to follow with Sharman Cheek in the [facility] clinic  2. HTN Stable No change required today  3. Obesity Weight loss is encouraged  [name]  Labs/ tests ordered today include:  Orders Placed This Encounter  Procedures  . Basic metabolic panel  . B Nat Peptide  . EKG 12-Lead     Signed, Thompson Grayer, MD  07/10/2015 2:59 PM     [address] [place] [address] [phone] (office) [phone] (fax)

## 2015-07-10 NOTE — Patient Instructions (Signed)
Medication Instructions:  Your physician has recommended you make the following change in your medication:  1) Increase Furosemide to 80 mg in the morning and 40mg  at 3pm    Labwork: Your physician recommends that you return for lab work today: BMP/BNP   Testing/Procedures: None ordered   Follow-Up:  Your physician recommends that you schedule a follow-up appointment in: 1-2 weeks with Dr [name] in [place] recommends that you schedule a follow-up appointment in: 6 months with Chanetta Marshall, NP   Remote monitoring is used to monitor your  ICD from home. This monitoring reduces the number of office visits required to check your device to one time per year. It allows [place] to keep an eye on the functioning of your device to ensure it is working properly. You are scheduled for a device check from home on 04/12/17You may send your transmission at any time that day. If you have a wireless device, the transmission will be sent automatically. After your physician reviews your transmission, you will receive a postcard with your next transmission date.    Any Other Special Instructions Will Be Listed Below (If Applicable).     If you need a refill on your cardiac medications before your next appointment, please call your pharmacy.

## 2015-07-11 LAB — BRAIN NATRIURETIC PEPTIDE: BRAIN NATRIURETIC PEPTIDE: 107.8 pg/mL — AB (ref 0.0–100.0)

## 2015-07-26 ENCOUNTER — Other Ambulatory Visit: Payer: Self-pay | Admitting: Physician Assistant

## 2015-07-30 NOTE — Progress Notes (Signed)
HPI: FU congestive heart failure. Patient previously resided in [place] and then [place]. He apparently was diagnosed with a cardiomyopathy. He does not know the etiology and denies catheterization. He has had previous CRT-D. He moved here in March of 2014. Last echocardiogram April 2016 showed ejection fraction 25-30%. There was grade 1 diastolic dysfunction. Left atrium is mildly dilated. Since last seen, He notes some dyspnea on exertion but no orthopnea, PND, chest pain or syncope. Mild edema.  Current Outpatient Prescriptions  Medication Sig Dispense Refill  . allopurinol (ZYLOPRIM) 100 MG tablet TAKE ONE TABLET BY MOUTH TWICE DAILY. NEED TO HAVE URIC ACID CHECKED. 60 tablet 5  . aspirin 81 MG tablet Take 81 mg by mouth daily.    . carvedilol (COREG) 25 MG tablet TAKE ONE TABLET BY MOUTH TWICE DAILY WITH A MEAL.APPOINTMENT NEEDED FOR FURTHER REFILLS. 60 tablet 3  . furosemide (LASIX) 40 MG tablet Take 2 tablets by mouth every morning and 1 tablet by mouth at 3 pm 270 tablet 3  . lisinopril (PRINIVIL,ZESTRIL) 20 MG tablet Take 1 tablet (20 mg total) by mouth daily. 90 tablet 3  . metFORMIN (GLUCOPHAGE XR) 750 MG 24 hr tablet Take 1 tablet (750 mg total) by mouth daily with breakfast. 30 tablet 3  . potassium chloride SA (K-DUR,KLOR-CON) 20 MEQ tablet Take 1 tablet (20 mEq total) by mouth daily. 90 tablet 3  . sennosides-docusate sodium (SENOKOT-S) 8.6-50 MG tablet Take 1 tablet by mouth daily as needed. For constipation    . sildenafil (VIAGRA) 100 MG tablet Take 1 tablet (100 mg total) by mouth as needed for erectile dysfunction (for use prior to sexual activity). 30 tablet 2  . simvastatin (ZOCOR) 40 MG tablet Take 1 tablet (40 mg total) by mouth daily. 90 tablet 3   No current facility-administered medications for this visit.     Past Medical History  Diagnosis Date  . CHF (congestive heart failure) ([place])   . Hypertension   . Diabetes mellitus ([place])     Type II.  Diet controlled  . Hyperlipidemia   . BPH (benign prostatic hyperplasia)   . Gout   . Nonischemic cardiomyopathy ([place])     a. s/p STJ CRTD    Past Surgical History  Procedure Laterality Date  . Cardiac defibrillator placement  Jan 2014    SJM Quadra Assura implanted by Dr [name] in [place] History  . Marital Status: Married    Spouse Name: N/A  . Number of Children: 1  . Years of Education: N/A   Occupational History  . Not on file.   Social History Main Topics  . Smoking status: Never Smoker   . Smokeless tobacco: Not on file  . Alcohol Use: Yes     Comment: Occasional  . Drug Use: No  . Sexual Activity: Yes   Other Topics Concern  . Not on file   Social History Narrative   Previously lived in [place] before moving to [place].  He has recently relocated to [place].    Family History  Problem Relation Age of Onset  . Diabetes Mother   . Hyperlipidemia Mother   . Diabetes Brother     ROS: no fevers or chills, productive cough, hemoptysis, dysphasia, odynophagia, melena, hematochezia, dysuria, hematuria, rash, seizure activity, orthopnea, PND, claudication. Remaining systems are negative.  Physical Exam: Well-developed well-nourished in no acute distress.  Skin is warm and dry.  HEENT  is normal.  Neck is supple.  Chest is clear to auscultation with normal expansion.  Cardiovascular exam is regular rate and rhythm.  Abdominal exam nontender or distended. No masses palpated. Extremities show 1+ edema. neuro grossly intact  ECG 07/10/2015-sinus rhythm with ventricular pacing.

## 2015-07-31 ENCOUNTER — Encounter: Payer: Self-pay | Admitting: Cardiology

## 2015-07-31 ENCOUNTER — Ambulatory Visit (INDEPENDENT_AMBULATORY_CARE_PROVIDER_SITE_OTHER): Payer: Medicare Other | Admitting: Cardiology

## 2015-07-31 VITALS — BP 138/80 | HR 60 | Ht 69.0 in | Wt 242.0 lb

## 2015-07-31 DIAGNOSIS — I10 Essential (primary) hypertension: Secondary | ICD-10-CM | POA: Diagnosis not present

## 2015-07-31 DIAGNOSIS — I5023 Acute on chronic systolic (congestive) heart failure: Secondary | ICD-10-CM | POA: Diagnosis not present

## 2015-07-31 DIAGNOSIS — Z95810 Presence of automatic (implantable) cardiac defibrillator: Secondary | ICD-10-CM

## 2015-07-31 DIAGNOSIS — I429 Cardiomyopathy, unspecified: Secondary | ICD-10-CM

## 2015-07-31 MED ORDER — SPIRONOLACTONE 25 MG PO TABS: 25.0000 mg | ORAL_TABLET | Freq: Every day | ORAL | Status: DC

## 2015-07-31 MED ORDER — SACUBITRIL-VALSARTAN 49-51 MG PO TABS: 1.0000 | ORAL_TABLET | Freq: Two times a day (BID) | ORAL | Status: DC

## 2015-07-31 NOTE — Assessment & Plan Note (Signed)
Medication adjustments as outlined under cardiomyopathy.

## 2015-07-31 NOTE — Assessment & Plan Note (Signed)
Patient is mildly volume overloaded today. Continue present dose of Lasix. Add spironolactone 25 mg daily. Discontinue potassium. Discontinue lisinopril. In 48 hours we will begin entresto 49/51 BID. Check potassium and renal function in 1 week. Patient instructed on importance of low sodium diet and fluid restriction. I have asked him to weigh himself daily. Take an additional 40 mg of Lasix for weight gain of 2 pounds. Note we have never been able to obtain records concerning etiology of previous cardiomyopathy. He does not recall a cardiac catheterization but states he has never had stents. I will plan a Lexiscan nuclear study to screen for ischemia. Follow-up in 4-6 weeks for further medication titration.

## 2015-07-31 NOTE — Patient Instructions (Signed)
Medication Instructions:   START SPIRONOLACTONE 25 MG ONCE DAILY  STOP LISINOPRIL  START ENTRESTO 49/51 MG ONE TABLET TWICE DAILY  STOP POTASSIUM  Labwork:  Your physician recommends that you return for lab work in: [place]  Testing/Procedures:  Your physician has requested that you have a lexiscan myoview. For further information please visit [email or web address]. Please follow instruction sheet, as given.    Follow-Up:  Your physician recommends that you schedule a follow-up appointment in: [place]

## 2015-07-31 NOTE — Assessment & Plan Note (Signed)
Patient with volume excess. Continue present dose of Lasix but add spironolactone. Further medication adjustments as outlined in the cardiomyopathy.

## 2015-07-31 NOTE — Assessment & Plan Note (Signed)
Followed by electrophysiology. 

## 2015-08-06 ENCOUNTER — Other Ambulatory Visit: Payer: Self-pay | Admitting: Physician Assistant

## 2015-08-07 ENCOUNTER — Ambulatory Visit (INDEPENDENT_AMBULATORY_CARE_PROVIDER_SITE_OTHER): Payer: Medicare Other | Admitting: Physician Assistant

## 2015-08-07 ENCOUNTER — Encounter: Payer: Self-pay | Admitting: Physician Assistant

## 2015-08-07 ENCOUNTER — Telehealth (HOSPITAL_COMMUNITY): Payer: Self-pay | Admitting: *Deleted

## 2015-08-07 VITALS — BP 135/62 | HR 61 | Ht 69.0 in | Wt 249.0 lb

## 2015-08-07 DIAGNOSIS — E119 Type 2 diabetes mellitus without complications: Secondary | ICD-10-CM

## 2015-08-07 DIAGNOSIS — Z23 Encounter for immunization: Secondary | ICD-10-CM | POA: Diagnosis not present

## 2015-08-07 DIAGNOSIS — I428 Other cardiomyopathies: Secondary | ICD-10-CM | POA: Diagnosis not present

## 2015-08-07 DIAGNOSIS — I429 Cardiomyopathy, unspecified: Secondary | ICD-10-CM | POA: Diagnosis not present

## 2015-08-07 LAB — POCT GLYCOSYLATED HEMOGLOBIN (HGB A1C): Hemoglobin A1C: 7

## 2015-08-07 MED ORDER — METFORMIN HCL ER 750 MG PO TB24: 750.0000 mg | ORAL_TABLET | Freq: Every day | ORAL | Status: DC

## 2015-08-07 NOTE — Progress Notes (Addendum)
   Subjective:    Patient ID: Earl Hatcher Sr., male    DOB: May 13, 1941, 75 y.o.   MRN: [id number]  HPI  Patient is a 75 year old male that presents for diabetes follow up. Patient states that he has been doing well aside from some recent shortness of breath that resolved when his cardiologist adjusted some of his prior medications. Patient states that he needs to sleep in his recliner in order to sleep well at night. Patient has not been vaccinated against influenza. Patient declines influenza vaccination at this time. Patient has not been received pneumococcal vaccination. Patient declines foot examination at this time due to a lack of "lotioning." Patient denies recent illness. Pt does not check his sugars. On metformin daily. Admits to not watching his diet. Denies any hypoglycemic events. No open wounds or sores. No recent eye exam.   Review of Systems  All other systems reviewed and are negative.      Objective:   Physical Exam  Constitutional: He is oriented to person, place, and time. He appears well-developed and well-nourished. No distress.  HENT:  Head: Normocephalic and atraumatic.  Nose: Nose normal.  Mouth/Throat: Oropharynx is clear and moist.  Eyes: Conjunctivae and EOM are normal. Pupils are equal, round, and reactive to light.  Neck: Normal range of motion.  Cardiovascular: Normal rate, regular rhythm, normal heart sounds and intact distal pulses.   Pulmonary/Chest: Effort normal and breath sounds normal. No respiratory distress. He has no wheezes. He has no rales.  Musculoskeletal: Normal range of motion.  Lymphadenopathy:    He has no cervical adenopathy.  Neurological: He is alert and oriented to person, place, and time.  Skin: Skin is warm and dry. He is not diaphoretic.  Psychiatric: He has a normal mood and affect. His behavior is normal. Judgment and thought content normal.      Assessment & Plan:   1. Diabetes Follow Up.  Patient underwent repeat  Hemoglobin A1C (7.0) today 08/07/2015. Increased from 3 months ago. Discussed diet changes and watching sugars and carbs. Refilled metformin daily. Patient had BMP on 07/10/2015 with blood glucose of 126. Patient declined a foot exam at today's visit. Patient will have Prevnar 13 today. Discussed the need for scheduling a visit with ophthalmology.   2. Health Maintenance Patient declined colonoscopy but was receptive to cologuard. Cologuard form was signed and patient will receive kit in mail.

## 2015-08-07 NOTE — Telephone Encounter (Signed)
Left message on voicemail per DPR in reference to upcoming appointment scheduled on 08/13/15 at 1000 with detailed instructions given per Myocardial Perfusion Study Information Sheet for the test. LM to arrive 15 minutes early, and that it is imperative to arrive on time for appointment to keep from having the test rescheduled. If you need to cancel or reschedule your appointment, please call the office within 24 hours of your appointment. Failure to do so may result in a cancellation of your appointment, and a $50 no show fee. Phone number given for call back for any questions. Hubbard Robinson, RN

## 2015-08-08 LAB — BASIC METABOLIC PANEL
BUN: 21 mg/dL (ref 7–25)
CHLORIDE: 102 mmol/L (ref 98–110)
CO2: 29 mmol/L (ref 20–31)
CREATININE: 1.11 mg/dL (ref 0.70–1.18)
Calcium: 9.8 mg/dL (ref 8.6–10.3)
Glucose, Bld: 118 mg/dL — ABNORMAL HIGH (ref 65–99)
POTASSIUM: 4.5 mmol/L (ref 3.5–5.3)
Sodium: 141 mmol/L (ref 135–146)

## 2015-08-13 ENCOUNTER — Encounter (HOSPITAL_COMMUNITY): Payer: Medicare Other

## 2015-08-13 ENCOUNTER — Telehealth: Payer: Self-pay

## 2015-08-13 NOTE — Telephone Encounter (Signed)
Remote ICM transmission received.  Attempted patient call and left message for return call.   

## 2015-08-16 ENCOUNTER — Telehealth: Payer: Self-pay | Admitting: Cardiology

## 2015-08-16 NOTE — Telephone Encounter (Signed)
Phone rings to [place] w/ no answer.

## 2015-08-16 NOTE — Telephone Encounter (Signed)
Spoke w/ Erasmo Downer - she advised resuming the lisinopril daily, further instructions to come once [facility] approved.  Communicated this w/ patient who voiced understanding.

## 2015-08-16 NOTE — Telephone Encounter (Signed)
Pt states he needs a PA done for entresto, apparently [facility] in [place] has tried to send [place] a notification.  He was switched on his meds 2 weeks ago - recommended to start spironolactone and entresto, stop carvedilol and lisinopril. - Pt notes he has not had entresto yet and never received samples.  I asked if he still had carvedilol and lisinopril on-hand, he states yes.  He is taking spirinolactone I informed pt I would ask what recommendations for meds in interim. Also will check for entresto samples.  Attempted to reach [employer], line busy when dialed at 1st attempt.  Routed to [place] for [employer] and to [place] for PA notification.

## 2015-08-16 NOTE — Telephone Encounter (Signed)
Unable to reach patient for remote monthly ICM follow up.  Next remote ICM transmission scheduled for 09/06/2015 and patient letter sent with new date and to call if experiencing any symptoms.    Corvue thoracic impedance below reference line from 08/09/2015 to 08/10/2015 suggesting fluid accumulation.   FYI only for Dr [name]   ICM trend for 08/12/2015  Trend for 08/12/2015 - 1 year view

## 2015-08-16 NOTE — Telephone Encounter (Signed)
Pt called in stating that a second prior authorization is needing to be sent to his pharmacy([facility] in [place]) because the medication is so expensive. Please f/u as soon as you can because he has been without this medication for a few days.   Thanks

## 2015-08-28 ENCOUNTER — Ambulatory Visit (INDEPENDENT_AMBULATORY_CARE_PROVIDER_SITE_OTHER): Payer: Medicare Other | Admitting: Cardiology

## 2015-08-28 ENCOUNTER — Encounter: Payer: Self-pay | Admitting: Cardiology

## 2015-08-28 VITALS — BP 142/84 | HR 60 | Ht 69.0 in | Wt 245.0 lb

## 2015-08-28 DIAGNOSIS — I420 Dilated cardiomyopathy: Secondary | ICD-10-CM

## 2015-08-28 DIAGNOSIS — Z95810 Presence of automatic (implantable) cardiac defibrillator: Secondary | ICD-10-CM

## 2015-08-28 DIAGNOSIS — I5023 Acute on chronic systolic (congestive) heart failure: Secondary | ICD-10-CM

## 2015-08-28 DIAGNOSIS — I10 Essential (primary) hypertension: Secondary | ICD-10-CM | POA: Diagnosis not present

## 2015-08-28 MED ORDER — SACUBITRIL-VALSARTAN 49-51 MG PO TABS: 1.0000 | ORAL_TABLET | Freq: Two times a day (BID) | ORAL | Status: DC

## 2015-08-28 NOTE — Assessment & Plan Note (Signed)
Followed by electrophysiology. 

## 2015-08-28 NOTE — Progress Notes (Signed)
HPI: FU congestive heart failure. Patient previously resided in [place] and then [place]. He apparently was diagnosed with a cardiomyopathy. He does not know the etiology and denies catheterization. He has had previous CRT-D. He moved here in March of 2014. Last echocardiogram April 2016 showed ejection fraction 25-30%. There was grade 1 diastolic dysfunction. Left atrium is mildly dilated. Nuclear study ordered at last office visit but not performed. Patient placed on entresto at last ov but not started. Since last seen, He has mild dyspnea on exertion but no orthopnea, PND, pedal edema, chest pain, palpitations or syncope.   Current Outpatient Prescriptions  Medication Sig Dispense Refill  . allopurinol (ZYLOPRIM) 100 MG tablet TAKE ONE TABLET BY MOUTH TWICE DAILY. NEED TO HAVE URIC ACID CHECKED. 60 tablet 5  . aspirin 81 MG tablet Take 81 mg by mouth daily.    . carvedilol (COREG) 25 MG tablet TAKE ONE TABLET BY MOUTH TWICE DAILY WITH A MEAL.APPOINTMENT NEEDED FOR FURTHER REFILLS. 60 tablet 3  . furosemide (LASIX) 40 MG tablet Take 2 tablets by mouth every morning and 1 tablet by mouth at 3 pm 270 tablet 3  . metFORMIN (GLUCOPHAGE-XR) 750 MG 24 hr tablet Take 1 tablet (750 mg total) by mouth daily with breakfast. 30 tablet 5  . sennosides-docusate sodium (SENOKOT-S) 8.6-50 MG tablet Take 1 tablet by mouth daily as needed. For constipation    . sildenafil (VIAGRA) 100 MG tablet Take 1 tablet (100 mg total) by mouth as needed for erectile dysfunction (for use prior to sexual activity). 30 tablet 2  . simvastatin (ZOCOR) 40 MG tablet Take 1 tablet (40 mg total) by mouth daily. 90 tablet 3  . spironolactone (ALDACTONE) 25 MG tablet Take 1 tablet (25 mg total) by mouth daily. 90 tablet 3   No current facility-administered medications for this visit.     Past Medical History  Diagnosis Date  . CHF (congestive heart failure) ([place])   . Hypertension   . Diabetes mellitus ([place])      Type II. Diet controlled  . Hyperlipidemia   . BPH (benign prostatic hyperplasia)   . Gout   . Nonischemic cardiomyopathy ([place])     a. s/p STJ CRTD    Past Surgical History  Procedure Laterality Date  . Cardiac defibrillator placement  Jan 2014    SJM Quadra Assura implanted by Dr [name] in [place] History  . Marital Status: Married    Spouse Name: N/A  . Number of Children: 1  . Years of Education: N/A   Occupational History  . Not on file.   Social History Main Topics  . Smoking status: Never Smoker   . Smokeless tobacco: Not on file  . Alcohol Use: Yes     Comment: Occasional  . Drug Use: No  . Sexual Activity: Yes   Other Topics Concern  . Not on file   Social History Narrative   Previously lived in [place] before moving to [place].  He has recently relocated to [place].    Family History  Problem Relation Age of Onset  . Diabetes Mother   . Hyperlipidemia Mother   . Diabetes Brother     ROS: no fevers or chills, productive cough, hemoptysis, dysphasia, odynophagia, melena, hematochezia, dysuria, hematuria, rash, seizure activity, orthopnea, PND, pedal edema, claudication. Remaining systems are negative.  Physical Exam: Well-developed obese in no acute distress.  Skin is warm and dry.  HEENT is normal.  Neck is supple.  Chest is clear to auscultation with normal expansion.  Cardiovascular exam is regular rate and rhythm.  Abdominal exam nontender or distended. No masses palpated. Extremities show no edema. neuro grossly intact

## 2015-08-28 NOTE — Assessment & Plan Note (Signed)
Plan to continue beta blocker and spironolactone. Discontinue lisinopril. 2 days later start entresto 49/51 BID. Check potassium and renal function one week later. Apparently he does not have documented coronary disease. Arrange nuclear study for risk stratification.

## 2015-08-28 NOTE — Assessment & Plan Note (Signed)
Patient appears to be improved from a volume status. Continue present dose of Lasix and spironolactone. Continue low-sodium diet.

## 2015-08-28 NOTE — Assessment & Plan Note (Signed)
Continue present blood pressure medications other than outlined above.

## 2015-08-28 NOTE — Patient Instructions (Signed)
Medication Instructions:   STOP LISINOPRIL X 2 DAYS  AFTER 2 DAYS START ENTRESTO ONE TABLET TWICE DAILY  Labwork:  Your physician recommends that you return for lab work in: [place]  Testing/Procedures:  Your physician has requested that you have a lexiscan myoview. For further information please visit [email or web address]. Please follow instruction sheet, as given.    Follow-Up:  Your physician recommends that you schedule a follow-up appointment in: [place]

## 2015-09-06 ENCOUNTER — Telehealth (HOSPITAL_COMMUNITY): Payer: Self-pay

## 2015-09-06 NOTE — Telephone Encounter (Signed)
Encounter complete. 

## 2015-09-11 ENCOUNTER — Inpatient Hospital Stay (HOSPITAL_COMMUNITY): Admission: RE | Admit: 2015-09-11 | Payer: Medicare Other | Source: Ambulatory Visit

## 2015-09-11 NOTE — Progress Notes (Signed)
No remote ICM transmission received.  Next transmission scheduled for 10/09/2015.

## 2015-09-19 ENCOUNTER — Telehealth (HOSPITAL_COMMUNITY): Payer: Self-pay

## 2015-09-19 NOTE — Telephone Encounter (Signed)
Encounter complete. 

## 2015-09-24 ENCOUNTER — Ambulatory Visit (HOSPITAL_COMMUNITY)
Admission: RE | Admit: 2015-09-24 | Discharge: 2015-09-24 | Disposition: A | Discharge status: Home / Self Care | Payer: Medicare Other | Source: Ambulatory Visit | Attending: Cardiology | Admitting: Cardiology

## 2015-09-24 DIAGNOSIS — I429 Cardiomyopathy, unspecified: Secondary | ICD-10-CM

## 2015-10-04 ENCOUNTER — Telehealth (HOSPITAL_COMMUNITY): Payer: Self-pay

## 2015-10-04 NOTE — Telephone Encounter (Signed)
Encounter complete. 

## 2015-10-08 ENCOUNTER — Ambulatory Visit (HOSPITAL_COMMUNITY)
Admission: RE | Admit: 2015-10-08 | Discharge: 2015-10-08 | Disposition: A | Discharge status: Home / Self Care | Payer: Medicare Other | Source: Ambulatory Visit | Attending: Cardiology | Admitting: Cardiology

## 2015-10-08 DIAGNOSIS — I429 Cardiomyopathy, unspecified: Secondary | ICD-10-CM | POA: Insufficient documentation

## 2015-10-08 DIAGNOSIS — R0609 Other forms of dyspnea: Secondary | ICD-10-CM | POA: Diagnosis not present

## 2015-10-08 DIAGNOSIS — R9439 Abnormal result of other cardiovascular function study: Secondary | ICD-10-CM | POA: Insufficient documentation

## 2015-10-08 DIAGNOSIS — R5383 Other fatigue: Secondary | ICD-10-CM | POA: Insufficient documentation

## 2015-10-08 DIAGNOSIS — E119 Type 2 diabetes mellitus without complications: Secondary | ICD-10-CM | POA: Insufficient documentation

## 2015-10-08 DIAGNOSIS — E669 Obesity, unspecified: Secondary | ICD-10-CM | POA: Diagnosis not present

## 2015-10-08 DIAGNOSIS — Z6836 Body mass index (BMI) 36.0-36.9, adult: Secondary | ICD-10-CM | POA: Insufficient documentation

## 2015-10-08 DIAGNOSIS — I119 Hypertensive heart disease without heart failure: Secondary | ICD-10-CM | POA: Diagnosis not present

## 2015-10-08 LAB — MYOCARDIAL PERFUSION IMAGING
CHL CUP NUCLEAR SDS: 4
CHL CUP NUCLEAR SRS: 12
CHL CUP NUCLEAR SSS: 16
CHL CUP RESTING HR STRESS: 60 {beats}/min
LV dias vol: 250 mL (ref 62–150)
LVSYSVOL: 191 mL
Peak HR: 60 {beats}/min
TID: 1.09

## 2015-10-08 MED ORDER — TECHNETIUM TC 99M SESTAMIBI GENERIC - CARDIOLITE
30.1000 | Freq: Once | INTRAVENOUS | Status: AC | PRN
  Administered 2015-10-08: 30.1 via INTRAVENOUS

## 2015-10-08 MED ORDER — REGADENOSON 0.4 MG/5ML IV SOLN
0.4000 mg | Freq: Once | INTRAVENOUS | Status: AC
  Administered 2015-10-08: 0.4 mg via INTRAVENOUS

## 2015-10-08 MED ORDER — TECHNETIUM TC 99M SESTAMIBI GENERIC - CARDIOLITE
10.1000 | Freq: Once | INTRAVENOUS | Status: AC | PRN
  Administered 2015-10-08: 10.1 via INTRAVENOUS

## 2015-10-09 ENCOUNTER — Telehealth: Payer: Self-pay | Admitting: Cardiology

## 2015-10-09 ENCOUNTER — Ambulatory Visit (INDEPENDENT_AMBULATORY_CARE_PROVIDER_SITE_OTHER): Payer: Medicare Other | Admitting: *Deleted

## 2015-10-09 ENCOUNTER — Telehealth: Payer: Self-pay

## 2015-10-09 DIAGNOSIS — I429 Cardiomyopathy, unspecified: Secondary | ICD-10-CM | POA: Diagnosis not present

## 2015-10-09 DIAGNOSIS — Z95810 Presence of automatic (implantable) cardiac defibrillator: Secondary | ICD-10-CM | POA: Diagnosis not present

## 2015-10-09 NOTE — Progress Notes (Signed)
Remote ICD transmission.   

## 2015-10-09 NOTE — Progress Notes (Signed)
EPIC Encounter for ICM Monitoring  Patient Name: Earl Chenault Sr. is a 75 y.o. male Date: 10/09/2015 Primary Care Physican: [name], PA-C Primary Cardiologist: [name] Electrophysiologist: Allred Dry Weight: unknown   Bi-V Pacing 99%      In the past month, have you:  1. Gained more than 2 pounds in a day or more than 5 pounds in a week? N/A  2. Had changes in your medications (with verification of current medications)? N/A  3. Had more shortness of breath than is usual for you? N/A  4. Limited your activity because of shortness of breath? N/A  5. Not been able to sleep because of shortness of breath? N/A  6. Had increased swelling in your feet or ankles? N/A  7. Had symptoms of dehydration (dizziness, dry mouth, increased thirst, decreased urine output) N/A  8. Had changes in sodium restriction? N/A  9. Been compliant with medication? N/A   ICM trend: 3 month view for 10/09/2015  ICM trend: 1 year view for 10/09/2015   Follow-up plan: ICM clinic phone appointment on 11/11/2015.  Attempted call to patient and unable to reach.  Transmission reviewed.  Thoracic impedance below reference line from 09/16/2015 to 09/23/2015 and 10/02/2015 to 10/06/2015 suggesting fluid accumulation.  Thoracic impedance returned to reference line 10/07/2015.     Rosalene Billings, RN, CCM 10/09/2015 3:25 PM

## 2015-10-09 NOTE — Telephone Encounter (Signed)
Remote ICM transmission received.  Attempted patient call and left message for return call.   

## 2015-10-09 NOTE — Telephone Encounter (Signed)
Spoke with pt and reminded pt of remote transmission that is due today. Pt verbalized understanding.   

## 2015-10-17 ENCOUNTER — Ambulatory Visit: Payer: Medicare Other | Admitting: Nurse Practitioner

## 2015-10-29 ENCOUNTER — Encounter: Payer: Self-pay | Admitting: *Deleted

## 2015-10-29 ENCOUNTER — Ambulatory Visit: Payer: Medicare Other | Admitting: Nurse Practitioner

## 2015-11-06 ENCOUNTER — Ambulatory Visit: Payer: Medicare Other | Admitting: Physician Assistant

## 2015-11-11 ENCOUNTER — Ambulatory Visit (INDEPENDENT_AMBULATORY_CARE_PROVIDER_SITE_OTHER): Payer: Medicare Other

## 2015-11-11 DIAGNOSIS — Z95810 Presence of automatic (implantable) cardiac defibrillator: Secondary | ICD-10-CM | POA: Diagnosis not present

## 2015-11-11 DIAGNOSIS — I420 Dilated cardiomyopathy: Secondary | ICD-10-CM | POA: Diagnosis not present

## 2015-11-12 NOTE — Progress Notes (Signed)
EPIC Encounter for ICM Monitoring  Patient Name: Earl Engman Sr. is a 75 y.o. male Date: 11/12/2015 Primary Care Physican: [name], PA-C Primary Cardiologist: [name] Electrophysiologist: Allred Dry Weight: 239 lbs   Bi-V Pacing 99%      In the past month, have you:  1. Gained more than 2 pounds in a day or more than 5 pounds in a week? no  2. Had changes in your medications (with verification of current medications)? no  3. Had more shortness of breath than is usual for you? no  4. Limited your activity because of shortness of breath? no  5. Not been able to sleep because of shortness of breath? no  6. Had increased swelling in your feet, ankles, legs or stomach area? no  7. Had symptoms of dehydration (dizziness, dry mouth, increased thirst, decreased urine output) no  8. Had changes in sodium restriction? no  9. Been compliant with medication? Yes  ICM trend: 3 month view for 11/11/2015   ICM trend: 1 year view for 11/11/2015   Follow-up plan: ICM clinic phone appointment 12/24/2015.  Office appointment with Dr [name] 12/25/2015.    FLUID LEVELS:   Corvue thoracic impedance decreased 10/17/2015 to 10/28/2015 suggesting fluid accumulation and returned to reference line 10/29/2015.  He stated he may have retained fluid due to eating foods higher in sodium during that time.   SYMPTOMS:   None.  Denied any symptoms such as weight gain of 3 pounds overnight or 5 pounds within a week, SOB and/or lower extremity swelling.  Encouraged to call for any fluid symptoms.   EDUCATION:   Limit sodium intake to < 2000 mg and fluid intake to 64 oz daily.   No changes today.        Rosalene Billings, RN, CCM 11/12/2015 3:24 PM

## 2015-11-19 LAB — CUP PACEART REMOTE DEVICE CHECK
Battery Remaining Longevity: 54 mo
Battery Remaining Percentage: 57 %
Battery Voltage: 2.95 V
Brady Statistic RA Percent Paced: 86 %
Date Time Interrogation Session: 20170412143154
HIGH POWER IMPEDANCE MEASURED VALUE: 52 Ohm
HIGH POWER IMPEDANCE MEASURED VALUE: 52 Ohm
Implantable Lead Implant Date: 20140117
Implantable Lead Implant Date: 20140117
Implantable Lead Location: 753858
Implantable Lead Location: 753859
Implantable Lead Location: 753860
Lead Channel Impedance Value: 990 Ohm
Lead Channel Pacing Threshold Amplitude: 0.75 V
Lead Channel Pacing Threshold Amplitude: 1.125 V
Lead Channel Pacing Threshold Pulse Width: 0.5 ms
Lead Channel Pacing Threshold Pulse Width: 0.5 ms
Lead Channel Sensing Intrinsic Amplitude: 11.6 mV
Lead Channel Setting Pacing Pulse Width: 0.5 ms
Lead Channel Setting Sensing Sensitivity: 0.5 mV
MDC IDC LEAD IMPLANT DT: 20140117
MDC IDC MSMT LEADCHNL RA IMPEDANCE VALUE: 530 Ohm
MDC IDC MSMT LEADCHNL RA PACING THRESHOLD AMPLITUDE: 0.75 V
MDC IDC MSMT LEADCHNL RA PACING THRESHOLD PULSEWIDTH: 0.5 ms
MDC IDC MSMT LEADCHNL RA SENSING INTR AMPL: 5 mV
MDC IDC MSMT LEADCHNL RV IMPEDANCE VALUE: 480 Ohm
MDC IDC SET LEADCHNL LV PACING AMPLITUDE: 2 V
MDC IDC SET LEADCHNL LV PACING PULSEWIDTH: 0.5 ms
MDC IDC SET LEADCHNL RA PACING AMPLITUDE: 1.75 V
MDC IDC SET LEADCHNL RV PACING AMPLITUDE: 2.125
MDC IDC STAT BRADY AP VP PERCENT: 86 %
MDC IDC STAT BRADY AP VS PERCENT: 1 %
MDC IDC STAT BRADY AS VP PERCENT: 13 %
MDC IDC STAT BRADY AS VS PERCENT: 1 %
Pulse Gen Serial Number: 7070427

## 2015-11-29 ENCOUNTER — Encounter: Payer: Self-pay | Admitting: Cardiology

## 2015-12-06 ENCOUNTER — Other Ambulatory Visit: Payer: Self-pay | Admitting: Physician Assistant

## 2015-12-16 NOTE — Progress Notes (Signed)
HPI: FU congestive heart failure. Patient previously resided in [place] and then [place]. He apparently was diagnosed with a cardiomyopathy. He does not know the etiology and denies catheterization. He has had previous CRT-D. He moved here in March of 2014. Last echocardiogram April 2016 showed ejection fraction 25-30%. There was grade 1 diastolic dysfunction. Left atrium is mildly dilated. Patient agreed to nuclear study April 2017. This showed ejection fraction 24% There was a large inferior lateral scar with minimal peri-infarct ischemia. Placed on entresto at last ov. Since last seen, the patient denies any dyspnea on exertion, orthopnea, PND, pedal edema, palpitations, syncope or chest pain.   Current Outpatient Prescriptions  Medication Sig Dispense Refill  . allopurinol (ZYLOPRIM) 100 MG tablet Take 1 tablet (100 mg total) by mouth 2 (two) times daily. NEED FOLLOW UP APPOINTMENT FOR MORE REFILLS 60 tablet 0  . aspirin 81 MG tablet Take 81 mg by mouth daily.    . carvedilol (COREG) 25 MG tablet TAKE ONE TABLET BY MOUTH TWICE DAILY WITH A MEAL.APPOINTMENT NEEDED FOR FURTHER REFILLS. 60 tablet 3  . furosemide (LASIX) 40 MG tablet Take 2 tablets by mouth every morning and 1 tablet by mouth at 3 pm 270 tablet 3  . metFORMIN (GLUCOPHAGE-XR) 750 MG 24 hr tablet Take 1 tablet (750 mg total) by mouth daily with breakfast. 30 tablet 5  . sacubitril-valsartan (ENTRESTO) 49-51 MG Take 1 tablet by mouth 2 (two) times daily. 60 tablet   . sennosides-docusate sodium (SENOKOT-S) 8.6-50 MG tablet Take 1 tablet by mouth daily as needed. For constipation    . sildenafil (VIAGRA) 100 MG tablet Take 1 tablet (100 mg total) by mouth as needed for erectile dysfunction (for use prior to sexual activity). 30 tablet 2  . simvastatin (ZOCOR) 40 MG tablet Take 1 tablet (40 mg total) by mouth daily. 90 tablet 3  . spironolactone (ALDACTONE) 25 MG tablet Take 1 tablet (25 mg total) by mouth daily. 90  tablet 3   No current facility-administered medications for this visit.     Past Medical History  Diagnosis Date  . CHF (congestive heart failure) ([place])   . Hypertension   . Diabetes mellitus ([place])     Type II. Diet controlled  . Hyperlipidemia   . BPH (benign prostatic hyperplasia)   . Gout   . Nonischemic cardiomyopathy ([place])     a. s/p STJ CRTD    Past Surgical History  Procedure Laterality Date  . Cardiac defibrillator placement  Jan 2014    SJM Quadra Assura implanted by Dr [name] in [place] History  . Marital Status: Married    Spouse Name: N/A  . Number of Children: 1  . Years of Education: N/A   Occupational History  . Not on file.   Social History Main Topics  . Smoking status: Never Smoker   . Smokeless tobacco: Not on file  . Alcohol Use: Yes     Comment: Occasional  . Drug Use: No  . Sexual Activity: Yes   Other Topics Concern  . Not on file   Social History Narrative   Previously lived in [place] before moving to [place].  He has recently relocated to [place].    Family History  Problem Relation Age of Onset  . Diabetes Mother   . Hyperlipidemia Mother   . Diabetes Brother     ROS: no fevers or chills, productive cough, hemoptysis, dysphasia,  odynophagia, melena, hematochezia, dysuria, hematuria, rash, seizure activity, orthopnea, PND, pedal edema, claudication. Remaining systems are negative.  Physical Exam: Well-developed well-nourished in no acute distress.  Skin is warm and dry.  HEENT is normal.  Neck is supple.  Chest is clear to auscultation with normal expansion.  Cardiovascular exam is regular rate and rhythm.  Abdominal exam nontender or distended. No masses palpated. Extremities show no edema. neuro grossly intact  Electrocardiogram shows sinus rhythm at a rate of 60. Left bundle branch block. First degree AV block.  Assessment and plan 1 hypertension-blood pressure  controlled. Continue present medications. 2 Prior ICD-followed by electrophysiology. 3 chronic systolic congestive heart failure-patient appears to be euvolemic on examination. Continue present dose of diuretics. Check potassium and renal function. Continue low sodium diet and fluid restriction. 4 congestive dilated cardiopathy-continue entresto, coreg and present dose of diuretics. His nuclear study showed infarct with minimal ischemia. We discussed proceeding with cardiac catheterization but he preferred medical therapy at this point. 5 presumed coronary disease-based on abnormal nuclear study. Continue aspirin and statin. Kirk Ruths, MD

## 2015-12-20 ENCOUNTER — Encounter: Payer: Self-pay | Admitting: Internal Medicine

## 2015-12-24 ENCOUNTER — Ambulatory Visit (INDEPENDENT_AMBULATORY_CARE_PROVIDER_SITE_OTHER): Payer: Medicare Other

## 2015-12-24 DIAGNOSIS — Z95810 Presence of automatic (implantable) cardiac defibrillator: Secondary | ICD-10-CM | POA: Diagnosis not present

## 2015-12-24 DIAGNOSIS — I5022 Chronic systolic (congestive) heart failure: Secondary | ICD-10-CM

## 2015-12-24 NOTE — Progress Notes (Signed)
EPIC Encounter for ICM Monitoring  Patient Name: Earl Buonocore Sr. is a 75 y.o. male Date: 12/24/2015 Primary Care Physican: [name], PA-C Primary Cardiologist: [name] Electrophysiologist: Allred Dry Weight: 238 lbs   Bi-V Pacing 99%     In the past month, have you:  1. Gained more than 2 pounds in a day or more than 5 pounds in a week? No  2. Had changes in your medications (with verification of current medications)? No  3. Had more shortness of breath than is usual for you? No   4. Limited your activity because of shortness of breath? No   5. Not been able to sleep because of shortness of breath? No  6. Had increased swelling in your feet, ankles, legs or stomach area? No   7. Had symptoms of dehydration (dizziness, dry mouth, increased thirst, decreased urine output) No   8. Had changes in sodium restriction? No   9. Been compliant with medication? No, does not take fluid pills on days he is driving and traveling.     ICM trend: 3 month view for 12/24/2015   ICM trend: 1 year view for 12/24/2015   Follow-up plan: ICM clinic phone appointment 02/14/2016.  Office appointment with Dr [name] on 12/25/2015 and Chanetta Marshall, NP on 01/14/2016.  FLUID LEVELS: Corvue impedance decreased 12/15/2015 to 12/18/2015 suggesting fluid accumulation and returned to baseline 12/18/2015.  Decreased impedance correlates with patient was driving [place] country and did not take his Lasix because he would have to make too many stops.   SYMPTOMS: None, denied any fluid symptoms such as weight gain of 3 pounds overnight or 5 pounds within a week, SOB and/or lower extremity swelling. Encouraged to call for any fluid symptoms.   EDUCATION: Limit sodium intake to < 2000 mg and fluid intake to 64 oz daily.     RECOMMENDATIONS: No changes today.          Sent copy to Dr [name] since patient has office appointment tomorrow, 12/25/2015.   Rosalene Billings, RN, CCM 12/24/2015 9:55 AM

## 2015-12-25 ENCOUNTER — Encounter: Payer: Self-pay | Admitting: Cardiology

## 2015-12-25 ENCOUNTER — Ambulatory Visit (INDEPENDENT_AMBULATORY_CARE_PROVIDER_SITE_OTHER): Payer: Medicare Other | Admitting: Cardiology

## 2015-12-25 VITALS — BP 109/64 | HR 59 | Ht 69.0 in | Wt 239.0 lb

## 2015-12-25 DIAGNOSIS — I480 Paroxysmal atrial fibrillation: Secondary | ICD-10-CM | POA: Insufficient documentation

## 2015-12-25 DIAGNOSIS — Z95810 Presence of automatic (implantable) cardiac defibrillator: Secondary | ICD-10-CM | POA: Diagnosis not present

## 2015-12-25 DIAGNOSIS — I5022 Chronic systolic (congestive) heart failure: Secondary | ICD-10-CM | POA: Diagnosis not present

## 2015-12-25 DIAGNOSIS — I10 Essential (primary) hypertension: Secondary | ICD-10-CM

## 2015-12-25 DIAGNOSIS — I420 Dilated cardiomyopathy: Secondary | ICD-10-CM | POA: Diagnosis not present

## 2015-12-25 HISTORY — DX: Paroxysmal atrial fibrillation: I48.0

## 2015-12-25 NOTE — Patient Instructions (Signed)

## 2015-12-26 LAB — BASIC METABOLIC PANEL
BUN: 31 mg/dL — AB (ref 7–25)
CALCIUM: 9.9 mg/dL (ref 8.6–10.3)
CHLORIDE: 103 mmol/L (ref 98–110)
CO2: 27 mmol/L (ref 20–31)
CREATININE: 1.22 mg/dL — AB (ref 0.70–1.18)
GLUCOSE: 102 mg/dL — AB (ref 65–99)
Potassium: 4.9 mmol/L (ref 3.5–5.3)
Sodium: 139 mmol/L (ref 135–146)

## 2015-12-27 ENCOUNTER — Encounter: Payer: Self-pay | Admitting: Nurse Practitioner

## 2016-01-13 ENCOUNTER — Telehealth: Payer: Self-pay | Admitting: *Deleted

## 2016-01-13 NOTE — Telephone Encounter (Signed)
-----   Message from Lelon Perla, MD sent at 12/26/2015  5:25 AM EDT ----- Earl White  ----- Message -----    From: Thompson Grayer, MD    Sent: 12/25/2015   6:05 PM      To: Lelon Perla, MD  You saw [name] today. i am reviewing remots and see that his most recent remote revealed an atypical atrial flutter with elevated V rates as well as afib.   These are clear cut and well diagnosed episodes but longest episode was 25 minutes.  Overall burden ,1%.  I wish I had seen this before you saw him today so that you would have the info.  Do you want [place] to just follow the burden for now and initiate anticoagulation if it increases? Might be nice to increase rate control a litte if you thought he had room.  Let me know what you think.  Thanks! JA

## 2016-01-13 NOTE — Telephone Encounter (Signed)
Left message for pt to call.

## 2016-01-15 ENCOUNTER — Encounter: Payer: Medicare Other | Admitting: Nurse Practitioner

## 2016-01-30 NOTE — Telephone Encounter (Signed)
Spoke with pt, Follow up scheduled  

## 2016-02-14 ENCOUNTER — Telehealth: Payer: Self-pay | Admitting: Cardiology

## 2016-02-14 ENCOUNTER — Ambulatory Visit (INDEPENDENT_AMBULATORY_CARE_PROVIDER_SITE_OTHER): Payer: Medicare Other

## 2016-02-14 DIAGNOSIS — Z95810 Presence of automatic (implantable) cardiac defibrillator: Secondary | ICD-10-CM | POA: Diagnosis not present

## 2016-02-14 DIAGNOSIS — I5022 Chronic systolic (congestive) heart failure: Secondary | ICD-10-CM

## 2016-02-14 NOTE — Progress Notes (Signed)
EPIC Encounter for ICM Monitoring  Patient Name: Earl Agrawal Sr. is a 75 y.o. male Date: 02/14/2016 Primary Care Physican: [name], PA-C Primary Cardiologist: [name] Electrophysiologist: Allred Dry Weight: unknown Bi-V Pacing:  98%       Heart Failure questions reviewed, pt asymptomatic   Thoracic impedance normal.  Recommendations: No changes.  He has not adhere to low salt diet in the last week.  Advised to limit salt intake to 2000 mg daily.    Follow-up plan: ICM clinic phone appointment on 03/24/2016 and office appointment with Dr [name] on 03/25/2016.  Copy of ICM check sent to device physician.   ICM trend: 03/16/2016      Earl Billings, RN 02/14/2016 1:59 PM

## 2016-02-14 NOTE — Telephone Encounter (Signed)
LMOVM reminding pt to send remote transmission.   

## 2016-02-16 ENCOUNTER — Other Ambulatory Visit: Payer: Self-pay | Admitting: Physician Assistant

## 2016-03-14 ENCOUNTER — Other Ambulatory Visit: Payer: Self-pay | Admitting: Family Medicine

## 2016-03-16 ENCOUNTER — Other Ambulatory Visit: Payer: Self-pay | Admitting: Physician Assistant

## 2016-03-24 ENCOUNTER — Ambulatory Visit (INDEPENDENT_AMBULATORY_CARE_PROVIDER_SITE_OTHER): Payer: Medicare Other

## 2016-03-24 DIAGNOSIS — I5022 Chronic systolic (congestive) heart failure: Secondary | ICD-10-CM

## 2016-03-24 DIAGNOSIS — Z95810 Presence of automatic (implantable) cardiac defibrillator: Secondary | ICD-10-CM | POA: Diagnosis not present

## 2016-03-24 NOTE — Progress Notes (Signed)
EPIC Encounter for ICM Monitoring  Patient Name: Earl Staple Sr. is a 75 y.o. male Date: 03/24/2016 Primary Care Physican: [name], PA-C Primary Cardiologist:[place] Electrophysiologist: Allred Dry Weight: 234 lb  Bi-V Pacing:  98%       Heart Failure questions reviewed, pt asymptomatic   Thoracic impedance abnormal suggesting fluid accumulation 03/15/2016 to 03/23/2016 and returned to baseline 03/24/2016.  Recommendations: No changes.  Low sodium diet education provided.    Follow-up plan: ICM clinic phone appointment on 04/24/2016.  Office visit with Dr [name] on 03/25/2016  Copy of ICM check sent to primary cardiologist and device physician.   ICM trend: 03/24/2016       Rosalene Billings, RN 03/24/2016 11:13 AM

## 2016-03-24 NOTE — Progress Notes (Addendum)
Patient denies dyspnea, chest pain, palpitations or syncope. He had to occasional      HPI: FU congestive heart failure. Patient previously resided in [place] and then [place]. He apparently was diagnosed with a cardiomyopathy. He does not know the etiology and denies catheterization. He has had previous CRT-D. He moved here in March of 2014. Last echocardiogram April 2016 showed ejection fraction 25-30%. There was grade 1 diastolic dysfunction. Left atrium is mildly dilated. Patient agreed to nuclear study April 2017. This showed ejection fraction 24% There was a large inferior lateral scar with minimal peri-infarct ischemia. Patient did not want to pursue cardiac catheterization. Placed on entresto at last ov. Since last seen, he denies dyspnea, chest pain, palpitations or syncope. He had 2 occasions of mild dizziness immediately after standing.  Current Outpatient Prescriptions  Medication Sig Dispense Refill  . allopurinol (ZYLOPRIM) 100 MG tablet TAKE ONE TABLET BY MOUTH TWICE DAILY. NEED APPOINTMENT FOR FURTHER REFILLS 60 tablet 0  . aspirin 81 MG tablet Take 81 mg by mouth daily.    . carvedilol (COREG) 25 MG tablet Take 1 tablet (25 mg total) by mouth 2 (two) times daily with a meal. NEED FOLLOW UP APPOINTMENT FOR MORE REFILLS 60 tablet 0  . furosemide (LASIX) 40 MG tablet Take 2 tablets by mouth every morning and 1 tablet by mouth at 3 pm 270 tablet 3  . metFORMIN (GLUCOPHAGE-XR) 750 MG 24 hr tablet TAKE ONE TABLET BY MOUTH ONCE DAILY WITH BREAKFAST 30 tablet 0  . sacubitril-valsartan (ENTRESTO) 49-51 MG Take 1 tablet by mouth 2 (two) times daily. 60 tablet   . sennosides-docusate sodium (SENOKOT-S) 8.6-50 MG tablet Take 1 tablet by mouth daily as needed. For constipation    . sildenafil (VIAGRA) 100 MG tablet Take 1 tablet (100 mg total) by mouth as needed for erectile dysfunction (for use prior to sexual activity). 30 tablet 2  . simvastatin (ZOCOR) 40 MG tablet Take 1 tablet  (40 mg total) by mouth daily. 90 tablet 3  . spironolactone (ALDACTONE) 25 MG tablet Take 1 tablet (25 mg total) by mouth daily. 90 tablet 3   No current facility-administered medications for this visit.      Past Medical History:  Diagnosis Date  . BPH (benign prostatic hyperplasia)   . CHF (congestive heart failure) (Home)   . Diabetes mellitus ([place])    Type II. Diet controlled  . Gout   . Hyperlipidemia   . Hypertension   . Nonischemic cardiomyopathy ([place])    a. s/p STJ CRTD  . Paroxysmal atrial fibrillation ([place]) 12/25/15    Past Surgical History:  Procedure Laterality Date  . CARDIAC DEFIBRILLATOR PLACEMENT  Jan 2014   SJM Quadra Assura implanted by Dr [name] in [place] History  . Marital status: Married    Spouse name: N/A  . Number of children: 1  . Years of education: N/A   Occupational History  . Not on file.   Social History Main Topics  . Smoking status: Never Smoker  . Smokeless tobacco: Not on file  . Alcohol use Yes     Comment: Occasional  . Drug use: No  . Sexual activity: Yes   Other Topics Concern  . Not on file   Social History Narrative   Previously lived in [place] before moving to [place].  He has recently relocated to [place].    Family History  Problem Relation Age of Onset  .  Diabetes Mother   . Hyperlipidemia Mother   . Diabetes Brother     ROS: no fevers or chills, productive cough, hemoptysis, dysphasia, odynophagia, melena, hematochezia, dysuria, hematuria, rash, seizure activity, orthopnea, PND, pedal edema, claudication. Remaining systems are negative.  Physical Exam: Well-developed well-nourished in no acute distress.  Skin is warm and dry.  HEENT is normal.  Neck is supple.  Chest is clear to auscultation with normal expansion.  Cardiovascular exam is regular rate and rhythm.  Abdominal exam nontender or distended. No masses palpated. Extremities show no edema. neuro  grossly intact  Electrocardiogram-sinus rhythm with first-degree AV block, left bundle branch block. A/P  1 ischemic cardiomyopathy-continue carvedilol. He is unclear about the dose of his entresto. He will contact us with the exact dose. If he is taking 49/51 mg twice a day I will double this. Check potassium and renal function in 1 week.  2 Chronic systolic congestive heart failure-he is euvolemic. Continue present dose of diuretics. Check potassium and renal function. Pending results I may decrease diuretic dose.  3 Hypertension-blood pressure elevated. We will increase entresto dose as outlined.   4 Presumed coronary disease based on abnormal nuclear study-continue aspirin and statin. Patient did not want to pursue cardiac catheterization previously.   5 prior ICD-management per electrophysiology.  Kirk Ruths, MD

## 2016-03-25 ENCOUNTER — Encounter: Payer: Self-pay | Admitting: Cardiology

## 2016-03-25 ENCOUNTER — Ambulatory Visit (INDEPENDENT_AMBULATORY_CARE_PROVIDER_SITE_OTHER): Payer: Medicare Other | Admitting: Cardiology

## 2016-03-25 VITALS — BP 172/78 | HR 60 | Ht 70.0 in | Wt 241.0 lb

## 2016-03-25 DIAGNOSIS — I10 Essential (primary) hypertension: Secondary | ICD-10-CM | POA: Diagnosis not present

## 2016-03-25 DIAGNOSIS — I2510 Atherosclerotic heart disease of native coronary artery without angina pectoris: Secondary | ICD-10-CM

## 2016-03-25 DIAGNOSIS — I5022 Chronic systolic (congestive) heart failure: Secondary | ICD-10-CM

## 2016-03-25 DIAGNOSIS — E785 Hyperlipidemia, unspecified: Secondary | ICD-10-CM | POA: Diagnosis not present

## 2016-03-25 DIAGNOSIS — I429 Cardiomyopathy, unspecified: Secondary | ICD-10-CM | POA: Diagnosis not present

## 2016-03-25 NOTE — Patient Instructions (Signed)
Medication Instructions:   NO CHANGE= CALL WITH ENTRESTO DOSAGE  [phone] [id number]  Labwork:  Your physician recommends that you return for lab work in: [place]:  Your physician recommends that you schedule a follow-up appointment in: [place]

## 2016-04-01 ENCOUNTER — Telehealth: Payer: Self-pay | Admitting: *Deleted

## 2016-04-01 DIAGNOSIS — I420 Dilated cardiomyopathy: Secondary | ICD-10-CM

## 2016-04-01 MED ORDER — SACUBITRIL-VALSARTAN 97-103 MG PO TABS: 1.0000 | ORAL_TABLET | Freq: Two times a day (BID) | ORAL | 12 refills | Status: DC

## 2016-04-01 NOTE — Telephone Encounter (Signed)
Spoke with pt, Aware of dr crenshaw's recommendations. New script sent to the pharmacy  

## 2016-04-01 NOTE — Telephone Encounter (Signed)
Spoke with pt, he is currently taking entresto 49/51 mg twice daily. Will make dr [name] aware

## 2016-04-01 NOTE — Telephone Encounter (Signed)
Increase entresto to 97/103 BID; bmet one week Kirk Ruths

## 2016-04-20 ENCOUNTER — Ambulatory Visit (INDEPENDENT_AMBULATORY_CARE_PROVIDER_SITE_OTHER): Payer: Medicare Other | Admitting: Physician Assistant

## 2016-04-20 ENCOUNTER — Encounter: Payer: Self-pay | Admitting: Physician Assistant

## 2016-04-20 VITALS — BP 118/58 | HR 59 | Ht 70.0 in | Wt 247.0 lb

## 2016-04-20 DIAGNOSIS — E119 Type 2 diabetes mellitus without complications: Secondary | ICD-10-CM | POA: Diagnosis not present

## 2016-04-20 DIAGNOSIS — M1000 Idiopathic gout, unspecified site: Secondary | ICD-10-CM | POA: Diagnosis not present

## 2016-04-20 DIAGNOSIS — I420 Dilated cardiomyopathy: Secondary | ICD-10-CM | POA: Diagnosis not present

## 2016-04-20 DIAGNOSIS — I2510 Atherosclerotic heart disease of native coronary artery without angina pectoris: Secondary | ICD-10-CM

## 2016-04-20 LAB — POCT GLYCOSYLATED HEMOGLOBIN (HGB A1C): Hemoglobin A1C: 6.9

## 2016-04-20 MED ORDER — ALLOPURINOL 100 MG PO TABS: ORAL_TABLET | ORAL | 5 refills | Status: DC

## 2016-04-20 MED ORDER — METFORMIN HCL ER 750 MG PO TB24: ORAL_TABLET | ORAL | 5 refills | Status: DC

## 2016-04-20 NOTE — Progress Notes (Signed)
   Subjective:    Patient ID: Earl Hatcher Sr., male    DOB: [birth year], 75 y.o.   MRN: [id number]  HPI Pt is a 75 yo male who presents to the clinic for DM follow up.  .. Past Medical History:  Diagnosis Date  . BPH (benign prostatic hyperplasia)   . CHF (congestive heart failure) ([place])   . Diabetes mellitus ([place])    Type II. Diet controlled  . Gout   . Hyperlipidemia   . Hypertension   . Nonischemic cardiomyopathy ([place])    a. s/p STJ CRTD  . Paroxysmal atrial fibrillation ([place]) 12/25/15   He is managed by Dr. [name] for cardiomyopathy. His entresto was recently increased and he is feeling better.   Today here for follow up:   DM- no problems or concerns. Continues to take metformin. No hypoglycemia. No ulcers or wounds. Does not check sugars. He tries to walk regularly and do yard work but does not exercise. He tries to stay away from high sugary foods.   Gout- no recent flares controlled on allopurinol. Per pt he ran out a few weeks ago and uric acid was checked and in the 10's. He has started back on alloupurinol and went for labs yesterday.    Review of Systems     Objective:   Physical Exam  Constitutional: He appears well-developed and well-nourished.  HENT:  Head: Normocephalic and atraumatic.  Neck: No JVD present.  Cardiovascular: Normal rate, regular rhythm and normal heart sounds.   Pulmonary/Chest: Effort normal and breath sounds normal.  Neurological:  No extremity edema.   Psychiatric: He has a normal mood and affect. His behavior is normal.          Assessment & Plan:  Type II DM- .[name] Lab Results  Component Value Date   HGBA1C 6.9 04/20/2016   A1C is 6.9. Under 7. Continue on metformin.  On ARB.  Encouraged patient to continue low sugar and carb diet.  BMP done by cardiology.  Encouraged patient to get annual eye exam.  Pt declined flu shot.  Follow up in 3 months.   Pt needs colonoscopy. Pt declines referral today. Will continue to  discuss at next appt.   Gout- controlled. No exacerbations. Refilled allopurinol for 6 months.

## 2016-04-21 LAB — BASIC METABOLIC PANEL
BUN: 32 mg/dL — ABNORMAL HIGH (ref 7–25)
CALCIUM: 9.7 mg/dL (ref 8.6–10.3)
CO2: 24 mmol/L (ref 20–31)
Chloride: 106 mmol/L (ref 98–110)
Creat: 1.36 mg/dL — ABNORMAL HIGH (ref 0.70–1.18)
Glucose, Bld: 150 mg/dL — ABNORMAL HIGH (ref 65–99)
Potassium: 5 mmol/L (ref 3.5–5.3)
SODIUM: 141 mmol/L (ref 135–146)

## 2016-04-22 ENCOUNTER — Telehealth: Payer: Self-pay | Admitting: *Deleted

## 2016-04-22 DIAGNOSIS — I5022 Chronic systolic (congestive) heart failure: Secondary | ICD-10-CM

## 2016-04-22 DIAGNOSIS — I429 Cardiomyopathy, unspecified: Secondary | ICD-10-CM

## 2016-04-22 DIAGNOSIS — I10 Essential (primary) hypertension: Secondary | ICD-10-CM

## 2016-04-22 MED ORDER — FUROSEMIDE 40 MG PO TABS: 80.0000 mg | ORAL_TABLET | Freq: Every day | ORAL | 3 refills | Status: DC

## 2016-04-22 NOTE — Telephone Encounter (Signed)
Patient voiced understanding of medication change. After discussion with dr [name] pt instructed to decrease furosemide to 80 mg daily with an extra 40 mg as needed. Lab orders mailed to the pt

## 2016-04-22 NOTE — Telephone Encounter (Signed)
-----   Message from Lelon Perla, MD sent at 04/21/2016  9:25 AM EDT ----- Change lasix to 40 BID with additional 40 mg daily for weight gain of 2-3 lbs; bmet 2 weeks Kirk Ruths

## 2016-04-24 ENCOUNTER — Ambulatory Visit (INDEPENDENT_AMBULATORY_CARE_PROVIDER_SITE_OTHER): Payer: Medicare Other

## 2016-04-24 DIAGNOSIS — Z95810 Presence of automatic (implantable) cardiac defibrillator: Secondary | ICD-10-CM

## 2016-04-24 DIAGNOSIS — I5022 Chronic systolic (congestive) heart failure: Secondary | ICD-10-CM | POA: Diagnosis not present

## 2016-04-24 NOTE — Progress Notes (Signed)
EPIC Encounter for ICM Monitoring  Patient Name: Earl Baise Sr. is a 75 y.o. male Date: 04/24/2016 Primary Care Physican: [name], PA-C Primary Cardiologist:Crenshaw Electrophysiologist: Allred Dry Weight:    238 lb  Bi-V Pacing:  98%               Heart Failure questions reviewed, pt asymptomatic   Thoracic impedance normal   Recommendations: No changes.  Advised to limit salt intake to 2000 mg daily.  Encouraged to call for fluid symptoms.    Follow-up plan: ICM clinic phone appointment on 05/25/2016.  Copy of ICM check sent to device physician.   ICM trend: 04/24/2016       Rosalene Billings, RN 04/24/2016 9:30 AM   '

## 2016-04-29 ENCOUNTER — Other Ambulatory Visit: Payer: Self-pay | Admitting: Physician Assistant

## 2016-05-23 ENCOUNTER — Other Ambulatory Visit: Payer: Self-pay | Admitting: Cardiology

## 2016-05-23 DIAGNOSIS — I429 Cardiomyopathy, unspecified: Secondary | ICD-10-CM

## 2016-05-25 ENCOUNTER — Ambulatory Visit (INDEPENDENT_AMBULATORY_CARE_PROVIDER_SITE_OTHER): Payer: Medicare Other

## 2016-05-25 DIAGNOSIS — Z95810 Presence of automatic (implantable) cardiac defibrillator: Secondary | ICD-10-CM | POA: Diagnosis not present

## 2016-05-25 DIAGNOSIS — I5022 Chronic systolic (congestive) heart failure: Secondary | ICD-10-CM

## 2016-05-25 NOTE — Progress Notes (Addendum)
EPIC Encounter for ICM Monitoring  Patient Name: Earl Evatt Sr. is a 75 y.o. male Date: 05/25/2016 Primary Care Physican: [name], PA-C Primary Cardiologist:Crenshaw Electrophysiologist: Allred Dry Weight:238lb  Bi-V Pacing: 98%      Heart Failure questions reviewed, pt asymptomatic.  He remains very active and travels a lot.  Reported he still rides his [place] [place] in the summer and spring time.     Thoracic impedance normal.  Recommendations: No changes.  Reinforced low salt food choices and limiting fluid intake to < 2 liters per day. Encouraged to call for fluid symptoms.    Follow-up plan: ICM clinic phone appointment on 06/25/2016.  Patient is traveling to [place] in December.     Copy of ICM check sent to device physician.   ICM trend: 05/25/2016       Rosalene Billings, RN 05/25/2016 11:09 AM

## 2016-05-25 NOTE — Telephone Encounter (Signed)
REFILL 

## 2016-06-03 DIAGNOSIS — I5022 Chronic systolic (congestive) heart failure: Secondary | ICD-10-CM | POA: Diagnosis not present

## 2016-06-04 ENCOUNTER — Encounter: Payer: Self-pay | Admitting: Cardiology

## 2016-06-04 ENCOUNTER — Telehealth: Payer: Self-pay | Admitting: *Deleted

## 2016-06-04 DIAGNOSIS — I5022 Chronic systolic (congestive) heart failure: Secondary | ICD-10-CM

## 2016-06-04 DIAGNOSIS — I10 Essential (primary) hypertension: Secondary | ICD-10-CM

## 2016-06-04 DIAGNOSIS — I429 Cardiomyopathy, unspecified: Secondary | ICD-10-CM

## 2016-06-04 LAB — BASIC METABOLIC PANEL
BUN: 43 mg/dL — ABNORMAL HIGH (ref 7–25)
CALCIUM: 9.6 mg/dL (ref 8.6–10.3)
CHLORIDE: 103 mmol/L (ref 98–110)
CO2: 24 mmol/L (ref 20–31)
CREATININE: 1.53 mg/dL — AB (ref 0.70–1.18)
Glucose, Bld: 119 mg/dL — ABNORMAL HIGH (ref 65–99)
Potassium: 5.1 mmol/L (ref 3.5–5.3)
SODIUM: 139 mmol/L (ref 135–146)

## 2016-06-04 MED ORDER — FUROSEMIDE 40 MG PO TABS: 60.0000 mg | ORAL_TABLET | Freq: Every day | ORAL | 3 refills | Status: DC

## 2016-06-04 NOTE — Telephone Encounter (Signed)
Patient voiced understanding to stop spironolactone and to decrease furosemide to 60 mg daily.

## 2016-06-04 NOTE — Telephone Encounter (Signed)
-----   Message from Lelon Perla, MD sent at 06/04/2016  7:37 AM EST ----- DC spironolactone; change lasix to 40 mg in AM and 20 q PM; call if develops SOB or increasing edema Earl White

## 2016-06-04 NOTE — Progress Notes (Signed)
HPI: FU congestive heart failure. Patient previously resided in [place] and then [place]. He apparently was diagnosed with a cardiomyopathy. He does not know the etiology and denies catheterization. He has had previous CRT-D. He moved here in March of 2014. Last echocardiogram April 2016 showed ejection fraction 25-30%. There was grade 1 diastolic dysfunction. Left atrium is mildly dilated. Patient agreed to nuclear study April 2017. This showed ejection fraction 24% There was a large inferior lateral scar with minimal peri-infarct ischemia. Patient did not want to pursue cardiac catheterization. Since last seen, the patient has dyspnea with more extreme activities but not with routine activities. It is relieved with rest. It is not associated with chest pain. There is no orthopnea, PND or pedal edema. There is no syncope or palpitations. There is no exertional chest pain.   Current Outpatient Prescriptions  Medication Sig Dispense Refill  . allopurinol (ZYLOPRIM) 100 MG tablet TAKE ONE TABLET BY MOUTH TWICE DAILY. 60 tablet 5  . aspirin 81 MG tablet Take 81 mg by mouth daily.    . carvedilol (COREG) 25 MG tablet Take 1 tablet (25 mg total) by mouth 2 (two) times daily with a meal. 60 tablet 5  . furosemide (LASIX) 40 MG tablet Take 1.5 tablets (60 mg total) by mouth daily. 270 tablet 3  . metFORMIN (GLUCOPHAGE-XR) 750 MG 24 hr tablet TAKE ONE TABLET BY MOUTH ONCE DAILY WITH BREAKFAST 30 tablet 5  . sacubitril-valsartan (ENTRESTO) 97-103 MG Take 1 tablet by mouth 2 (two) times daily. 60 tablet 12  . sennosides-docusate sodium (SENOKOT-S) 8.6-50 MG tablet Take 1 tablet by mouth daily as needed. For constipation    . sildenafil (VIAGRA) 100 MG tablet Take 1 tablet (100 mg total) by mouth as needed for erectile dysfunction (for use prior to sexual activity). 30 tablet 2  . simvastatin (ZOCOR) 40 MG tablet Take 1 tablet (40 mg total) by mouth daily. 90 tablet 3   No current  facility-administered medications for this visit.      Past Medical History:  Diagnosis Date  . BPH (benign prostatic hyperplasia)   . CHF (congestive heart failure) ([place])   . Diabetes mellitus ([place])    Type II. Diet controlled  . Gout   . Hyperlipidemia   . Hypertension   . Nonischemic cardiomyopathy ([place])    a. s/p STJ CRTD  . Paroxysmal atrial fibrillation ([place]) 12/25/15    Past Surgical History:  Procedure Laterality Date  . CARDIAC DEFIBRILLATOR PLACEMENT  Jan 2014   SJM Quadra Assura implanted by Dr [name] in [place] History  . Marital status: Married    Spouse name: N/A  . Number of children: 1  . Years of education: N/A   Occupational History  . Not on file.   Social History Main Topics  . Smoking status: Never Smoker  . Smokeless tobacco: Never Used  . Alcohol use Yes     Comment: Occasional  . Drug use: No  . Sexual activity: Yes   Other Topics Concern  . Not on file   Social History Narrative   Previously lived in [place] before moving to [place].  He has recently relocated to [place].    Family History  Problem Relation Age of Onset  . Diabetes Mother   . Hyperlipidemia Mother   . Diabetes Brother     ROS: no fevers or chills, productive cough, hemoptysis, dysphasia, odynophagia, melena, hematochezia, dysuria,  hematuria, rash, seizure activity, orthopnea, PND, pedal edema, claudication. Remaining systems are negative.  Physical Exam: Well-developed well-nourished in no acute distress.  Skin is warm and dry.  HEENT is normal.  Neck is supple.  Chest is clear to auscultation with normal expansion.  Cardiovascular exam is regular rate and rhythm.  Abdominal exam nontender or distended. No masses palpated. Extremities show no edema. neuro grossly intact  ECG  A/P  1 ischemic cardiomyopathy-continue carvedilol. Continue entresto. Check potassium and renal function in 1 week.  2 Chronic  systolic congestive heart failure-he is euvolemic. Recent creatinine increased. Spironolactone discontinued. Lasix reduced. Check potassium and renal function in 1 week. Follow clinically and adjust medications as needed. Patient instructed on low sodium diet.  3 Hypertension-blood pressure controlled; continue present meds.  4 Presumed coronary disease based on abnormal nuclear study-continue aspirin and statin. Patient did not want to pursue cardiac catheterization previously.   5 prior ICD-management per electrophysiology.  Kirk Ruths, MD

## 2016-06-05 ENCOUNTER — Other Ambulatory Visit: Payer: Self-pay

## 2016-06-10 ENCOUNTER — Encounter: Payer: Self-pay | Admitting: Cardiology

## 2016-06-10 ENCOUNTER — Ambulatory Visit (INDEPENDENT_AMBULATORY_CARE_PROVIDER_SITE_OTHER): Payer: Medicare Other | Admitting: Cardiology

## 2016-06-10 VITALS — BP 121/70 | HR 60 | Ht 70.0 in | Wt 244.1 lb

## 2016-06-10 DIAGNOSIS — I2510 Atherosclerotic heart disease of native coronary artery without angina pectoris: Secondary | ICD-10-CM

## 2016-06-10 DIAGNOSIS — I10 Essential (primary) hypertension: Secondary | ICD-10-CM

## 2016-06-10 DIAGNOSIS — I429 Cardiomyopathy, unspecified: Secondary | ICD-10-CM

## 2016-06-10 DIAGNOSIS — I5022 Chronic systolic (congestive) heart failure: Secondary | ICD-10-CM

## 2016-06-10 DIAGNOSIS — E7800 Pure hypercholesterolemia, unspecified: Secondary | ICD-10-CM

## 2016-06-10 NOTE — Patient Instructions (Signed)
Medication Instructions:   NO CHANGE  Labwork:  Your physician recommends that you return for lab work in:ONE WEEK  Follow-Up:  Your physician wants you to follow-up in: [place] will receive a reminder letter in the mail two months in advance. If you don't receive a letter, please call our office to schedule the follow-up appointment.   If you need a refill on your cardiac medications before your next appointment, please call your pharmacy.

## 2016-06-18 ENCOUNTER — Telehealth: Payer: Self-pay | Admitting: Cardiology

## 2016-06-18 NOTE — Telephone Encounter (Signed)
LMOVM reminding pt to send remote transmission.   

## 2016-06-25 ENCOUNTER — Ambulatory Visit (INDEPENDENT_AMBULATORY_CARE_PROVIDER_SITE_OTHER): Payer: Medicare Other

## 2016-06-25 DIAGNOSIS — I5022 Chronic systolic (congestive) heart failure: Secondary | ICD-10-CM

## 2016-06-25 DIAGNOSIS — Z95810 Presence of automatic (implantable) cardiac defibrillator: Secondary | ICD-10-CM | POA: Diagnosis not present

## 2016-06-25 NOTE — Progress Notes (Signed)
EPIC Encounter for ICM Monitoring  Patient Name: Earl Cullimore Sr. is a 75 y.o. male Date: 06/25/2016 Primary Care Physican: [name], PA-C Primary Cardiologist:Crenshaw Electrophysiologist: Allred Dry Weight:238lb  Bi-V Pacing: 98%      Heart Failure questions reviewed, pt asymptomatic and not experiencing any fluid symptoms  Thoracic impedance is abnormal for 16 days, starting 12/12, suggesting fluid accumulation since Lasix was decreased at last office visit with Dr [name] on 12/6 but returned to baseline today.    Labs: 06/03/2016 Creatinine 1.53, BUN 43, Potassium 5.1, Sodium 139 (at time of office visit) 04/20/2016 Creatinine 1.36, BUN 32, Potassium 5.0, Sodium 141  12/25/2015 Creatinine 1.22, BUN 31, Potassium 4.9, Sodium 139  08/07/2015 Creatinine 1.11, BUN 21, Potassium 4.5, Sodium 141  07/10/2015 Creatinine 1.14, BUN 19, Potassium 4.3, Sodium 141   Recommendations:   Did not recommend any changes today since impedance returned to baseline today but advised him would send a copy to Dr Rayann Heman and Dr [name] for review and if any recommendations will call him back.  Encouraged to call if he experiences any fluid symptoms before recheck on 07/03/2016.    Follow-up plan: ICM clinic phone appointment on 07/03/2016 to recheck fluid levels.  Copy of ICM check sent to primary cardiologist and device physician.   3 month ICM trend : 06/25/2016   1 Year ICM trend:      Earl Billings, RN 06/25/2016 12:55 PM

## 2016-06-28 ENCOUNTER — Other Ambulatory Visit: Payer: Self-pay | Admitting: Internal Medicine

## 2016-07-03 ENCOUNTER — Encounter: Payer: Self-pay | Admitting: Physician Assistant

## 2016-07-03 ENCOUNTER — Telehealth: Payer: Self-pay

## 2016-07-03 ENCOUNTER — Ambulatory Visit (INDEPENDENT_AMBULATORY_CARE_PROVIDER_SITE_OTHER): Payer: Medicare Other

## 2016-07-03 ENCOUNTER — Ambulatory Visit (INDEPENDENT_AMBULATORY_CARE_PROVIDER_SITE_OTHER): Payer: Medicare Other | Admitting: Physician Assistant

## 2016-07-03 VITALS — BP 128/63 | HR 71 | Ht 70.0 in | Wt 256.0 lb

## 2016-07-03 DIAGNOSIS — R350 Frequency of micturition: Secondary | ICD-10-CM

## 2016-07-03 DIAGNOSIS — R3915 Urgency of urination: Secondary | ICD-10-CM | POA: Diagnosis not present

## 2016-07-03 DIAGNOSIS — I5022 Chronic systolic (congestive) heart failure: Secondary | ICD-10-CM

## 2016-07-03 DIAGNOSIS — R972 Elevated prostate specific antigen [PSA]: Secondary | ICD-10-CM | POA: Diagnosis not present

## 2016-07-03 DIAGNOSIS — N401 Enlarged prostate with lower urinary tract symptoms: Secondary | ICD-10-CM

## 2016-07-03 DIAGNOSIS — Z95810 Presence of automatic (implantable) cardiac defibrillator: Secondary | ICD-10-CM

## 2016-07-03 MED ORDER — TAMSULOSIN HCL 0.4 MG PO CAPS: 0.4000 mg | ORAL_CAPSULE | Freq: Every day | ORAL | 2 refills | Status: DC

## 2016-07-03 NOTE — Progress Notes (Signed)
EPIC Encounter for ICM Monitoring  Patient Name: Earl Urquijo Sr. is a 76 y.o. male Date: 07/03/2016 Primary Care Physican: [name], PA-C Primary Cardiologist:Crenshaw Electrophysiologist: Allred Dry Weight:unknown Bi-V Pacing: 98%         Attempted ICM call and unable to reach.  Left detailed message regarding transmission.  Transmission reviewed.   Thoracic impedance normal.  Labs: 06/03/2016 Creatinine 1.53, BUN 43, Potassium 5.1, Sodium 139 (at time of office visit) 04/20/2016 Creatinine 1.36, BUN 32, Potassium 5.0, Sodium 141  12/25/2015 Creatinine 1.22, BUN 31, Potassium 4.9, Sodium 139  08/07/2015 Creatinine 1.11, BUN 21, Potassium 4.5, Sodium 141  07/10/2015 Creatinine 1.14, BUN 19, Potassium 4.3, Sodium 141   Recommendations:  Provided ICM direct number and encouraged to call for fluid symptoms.    Follow-up plan: ICM clinic phone appointment on 07/27/2016.  Copy of ICM check sent to device physician.   3 month ICM trend : 07/03/2016   1 Year ICM trend:      Rosalene Billings, RN 07/03/2016 3:55 PM

## 2016-07-03 NOTE — Telephone Encounter (Signed)
Remote ICM transmission received.  Attempted patient call and left detailed message regarding transmission and next ICM scheduled for 07/27/2016.  Advised to return call for any fluid symptoms or questions.

## 2016-07-03 NOTE — Patient Instructions (Signed)
flomax sent to pharmacy. Will make referral to urology.

## 2016-07-04 LAB — BASIC METABOLIC PANEL WITH GFR
BUN: 22 mg/dL (ref 7–25)
CHLORIDE: 102 mmol/L (ref 98–110)
CO2: 25 mmol/L (ref 20–31)
CREATININE: 1.32 mg/dL — AB (ref 0.70–1.18)
Calcium: 9.2 mg/dL (ref 8.6–10.3)
GFR, Est African American: 61 mL/min (ref >=60)
GFR, Est Non African American: 52 mL/min — ABNORMAL LOW (ref >=60)
Glucose, Bld: 114 mg/dL — ABNORMAL HIGH (ref 65–99)
Potassium: 4.9 mmol/L (ref 3.5–5.3)
Sodium: 141 mmol/L (ref 135–146)

## 2016-07-04 LAB — PSA: PSA: 6.3 ng/mL — ABNORMAL HIGH (ref <=4.0)

## 2016-07-06 ENCOUNTER — Encounter: Payer: Self-pay | Admitting: Physician Assistant

## 2016-07-06 NOTE — Progress Notes (Signed)
   Subjective:    Patient ID: Earl Hatcher Sr., male    DOB: September 16, 1940, 76 y.o.   MRN: [id number]  HPI Pt is a 76 yo male who presents to the clinic with increase in the last 2 months of urinary frequency, urgency. He has hx of BPH but not been treated. He went to urology once but did not llike provider.   Review of Systems  All other systems reviewed and are negative.      Objective:   Physical Exam  Constitutional: He appears well-developed and well-nourished.  Abdominal: Soft. Bowel sounds are normal. He exhibits no distension and no mass. There is no tenderness. There is no rebound and no guarding.  Genitourinary:  Genitourinary Comments: Prostate enlargement without masses.  hemoccult negative.   Neurological: He is alert.  Skin: Skin is dry.  Psychiatric: He has a normal mood and affect. His behavior is normal.          Assessment & Plan:  [name] KitchenMarland KitchenDiagnoses and all orders for this visit:  Benign prostatic hyperplasia with urinary frequency -     BASIC METABOLIC PANEL WITH GFR -     PSA -     tamsulosin (FLOMAX) 0.4 MG CAPS capsule; Take 1 capsule (0.4 mg total) by mouth daily.  Urinary urgency -     BASIC METABOLIC PANEL WITH GFR -     PSA -     tamsulosin (FLOMAX) 0.4 MG CAPS capsule; Take 1 capsule (0.4 mg total) by mouth daily.  Elevated PSA -     BASIC METABOLIC PANEL WITH GFR -     PSA -     tamsulosin (FLOMAX) 0.4 MG CAPS capsule; Take 1 capsule (0.4 mg total) by mouth daily.  will make urology appt due to elevated PSA.  AUA today was 12.  Will check BMP. Will send results to Dr. [name].  Start flomax.

## 2016-07-27 ENCOUNTER — Ambulatory Visit (INDEPENDENT_AMBULATORY_CARE_PROVIDER_SITE_OTHER): Payer: Medicare Other

## 2016-07-27 DIAGNOSIS — Z95810 Presence of automatic (implantable) cardiac defibrillator: Secondary | ICD-10-CM

## 2016-07-27 DIAGNOSIS — I5022 Chronic systolic (congestive) heart failure: Secondary | ICD-10-CM

## 2016-07-27 NOTE — Progress Notes (Signed)
EPIC Encounter for ICM Monitoring  Patient Name: Earl Sugimoto Sr. is a 76 y.o. male Date: 07/27/2016 Primary Care Physican: [name], PA-C Primary Cardiologist:Crenshaw Electrophysiologist: Allred Dry Weight:unknown Bi-V Pacing:  98%       Heart Failure questions reviewed, pt asymptomatic   Thoracic impedance normal since 1/25 but was abnormal 07/11/16 to 07/22/16.    Labs: 07/03/2016 Creatinine 1.32, BUN 22, Potassium 4.9, Sodium 141 06/03/2016 Creatinine 1.53, BUN 43, Potassium 5.1, Sodium 139 (at time of office visit) 04/20/2016 Creatinine 1.36, BUN 32, Potassium 5.0, Sodium 141  12/25/2015 Creatinine 1.22, BUN 31, Potassium 4.9, Sodium 139  08/07/2015 Creatinine 1.11, BUN 21, Potassium 4.5, Sodium 141  07/10/2015 Creatinine 1.14, BUN 19, Potassium 4.3, Sodium 141   Recommendations: No changes. Reminded to limit dietary salt intake to 2000 mg/day and fluid intake to < 2 liters/day. Encouraged to call for fluid symptoms.  Follow-up plan: ICM clinic phone appointment on 08/27/2016.  Copy of ICM check sent to device physician.   3 month ICM trend: 07/27/2016   1 Year ICM trend:      Rosalene Billings, RN 07/27/2016 12:44 PM

## 2016-08-27 ENCOUNTER — Ambulatory Visit (INDEPENDENT_AMBULATORY_CARE_PROVIDER_SITE_OTHER): Payer: Medicare Other | Admitting: *Deleted

## 2016-08-27 DIAGNOSIS — I5022 Chronic systolic (congestive) heart failure: Secondary | ICD-10-CM | POA: Diagnosis not present

## 2016-08-27 DIAGNOSIS — I429 Cardiomyopathy, unspecified: Secondary | ICD-10-CM

## 2016-08-27 DIAGNOSIS — Z95810 Presence of automatic (implantable) cardiac defibrillator: Secondary | ICD-10-CM

## 2016-08-27 NOTE — Progress Notes (Signed)
Remote ICD transmission.   

## 2016-08-28 ENCOUNTER — Telehealth: Payer: Self-pay

## 2016-08-28 NOTE — Telephone Encounter (Signed)
Remote ICM transmission received.  Attempted patient call and no answer or answering machine.  

## 2016-08-28 NOTE — Progress Notes (Signed)
EPIC Encounter for ICM Monitoring  Patient Name: Earl Breitenbach Sr. is a 76 y.o. male Date: 08/28/2016 Primary Care Physican: [name], PA-C Primary Cardiologist:Crenshaw Electrophysiologist: Allred Dry Weight:unknown Bi-V Pacing:  98%                                                       Attempted call to patient and unable to reach.   Transmission reviewed.   Thoracic impedance normal     Labs: 07/03/2016 Creatinine 1.32, BUN 22, Potassium 4.9, Sodium 141 06/03/2016 Creatinine 1.53, BUN 43, Potassium 5.1, Sodium 139 (at time of office visit) 04/20/2016 Creatinine 1.36, BUN 32, Potassium 5.0, Sodium 141  12/25/2015 Creatinine 1.22, BUN 31, Potassium 4.9, Sodium 139  08/07/2015 Creatinine 1.11, BUN 21, Potassium 4.5, Sodium 141  07/10/2015 Creatinine 1.14, BUN 19, Potassium 4.3, Sodium 141 .  Prescribed dosage: Furosemide 40 mg 1.5 tablets (60 mg total) daily  Recommendations: NONE - Unable to reach patient   Follow-up plan: ICM clinic phone appointment on 09/28/2016.  Copy of ICM check sent to device physician.   3 month ICM trend: 08/27/2016         1 Year ICM trend:      Rosalene Billings, RN 08/28/2016 4:29 PM

## 2016-08-30 ENCOUNTER — Other Ambulatory Visit: Payer: Self-pay | Admitting: Physician Assistant

## 2016-09-01 ENCOUNTER — Encounter: Payer: Self-pay | Admitting: Cardiology

## 2016-09-01 LAB — CUP PACEART REMOTE DEVICE CHECK
Battery Remaining Percentage: 46 %
HIGH POWER IMPEDANCE MEASURED VALUE: 49 Ohm
Implantable Lead Implant Date: 20140117
Implantable Lead Implant Date: 20140117
Implantable Lead Location: 753859
Implantable Lead Location: 753860
Lead Channel Impedance Value: 530 Ohm
Lead Channel Impedance Value: 980 Ohm
Lead Channel Pacing Threshold Amplitude: 0.625 V
Lead Channel Pacing Threshold Amplitude: 0.75 V
Lead Channel Pacing Threshold Amplitude: 1.125 V
Lead Channel Pacing Threshold Pulse Width: 0.5 ms
Lead Channel Sensing Intrinsic Amplitude: 5 mV
MDC IDC LEAD IMPLANT DT: 20140117
MDC IDC LEAD LOCATION: 753858
MDC IDC MSMT LEADCHNL RA PACING THRESHOLD PULSEWIDTH: 0.5 ms
MDC IDC MSMT LEADCHNL RV IMPEDANCE VALUE: 480 Ohm
MDC IDC MSMT LEADCHNL RV PACING THRESHOLD PULSEWIDTH: 0.5 ms
MDC IDC MSMT LEADCHNL RV SENSING INTR AMPL: 11.6 mV
MDC IDC PG IMPLANT DT: 20140117
MDC IDC PG SERIAL: 7070427
MDC IDC SESS DTM: 20180306144445
MDC IDC STAT BRADY RA PERCENT PACED: 89 %
MDC IDC STAT BRADY RV PERCENT PACED: 98 %

## 2016-09-28 ENCOUNTER — Telehealth: Payer: Self-pay | Admitting: Cardiology

## 2016-09-28 NOTE — Telephone Encounter (Signed)
Attempted to confirm remote transmission with pt. No answer and was unable to leave a message.   

## 2016-10-01 NOTE — Progress Notes (Signed)
No ICM remote transmission received for 09/28/2016 and next ICM transmission scheduled for 11/17/2016.  Defib office check with Dr Rayann Heman 10/14/2016.

## 2016-10-14 ENCOUNTER — Encounter: Payer: Medicare Other | Admitting: Internal Medicine

## 2016-10-19 ENCOUNTER — Encounter: Payer: Self-pay | Admitting: Internal Medicine

## 2016-11-09 ENCOUNTER — Other Ambulatory Visit: Payer: Self-pay | Admitting: Physician Assistant

## 2016-11-10 ENCOUNTER — Other Ambulatory Visit: Payer: Self-pay | Admitting: Physician Assistant

## 2016-11-17 ENCOUNTER — Ambulatory Visit (INDEPENDENT_AMBULATORY_CARE_PROVIDER_SITE_OTHER): Payer: Medicare Other

## 2016-11-17 DIAGNOSIS — I5022 Chronic systolic (congestive) heart failure: Secondary | ICD-10-CM | POA: Diagnosis not present

## 2016-11-17 DIAGNOSIS — Z95810 Presence of automatic (implantable) cardiac defibrillator: Secondary | ICD-10-CM

## 2016-11-17 NOTE — Progress Notes (Signed)
EPIC Encounter for ICM Monitoring  Patient Name: Earl Gibbons Sr. is a 76 y.o. male Date: 11/17/2016 Primary Care Physican: Lavada Mesi Primary Cardiologist:Crenshaw Electrophysiologist: Allred Dry Weight:unknown Bi-V Pacing: 98%      Heart Failure questions reviewed, pt asymptomatic    Thoracic impedance above baseline suggesting dryness.   Prescribed dosage: Furosemide 40 mg 1.5 tablets (60 mg total) daily.    Labs: 07/03/2016 Creatinine 1.32, BUN 22, Potassium 4.9, Sodium 141 06/03/2016 Creatinine 1.53, BUN 43, Potassium 5.1, Sodium 139 (at time of office visit) 04/20/2016 Creatinine 1.36, BUN 32, Potassium 5.0, Sodium 141  12/25/2015 Creatinine 1.22, BUN 31, Potassium 4.9, Sodium 139  08/07/2015 Creatinine 1.11, BUN 21, Potassium 4.5, Sodium 141  07/10/2015 Creatinine 1.14, BUN 19, Potassium 4.3, Sodium 141 .   Recommendations:  He reported he does not feel like he has any dehydration symptoms in the last couple of days.  Advised to drink steady amount of fluids daily, up to 64 oz a day. No changes. Encouraged to call for fluid symptoms or use local ER for any urgent symptoms.  Follow-up plan: ICM clinic phone appointment on 12/18/2016.  Advised him to call and reschedule appointment with Dr Rayann Heman since he missed the one in April and he is due to make an appointment with Dr [name] for June.   Copy of ICM check sent to device physician.   3 month ICM trend: 11/17/2016   1 Year ICM trend:      Rosalene Billings, RN 11/17/2016 1:09 PM

## 2016-11-26 ENCOUNTER — Encounter: Payer: Medicare Other | Admitting: *Deleted

## 2016-11-27 ENCOUNTER — Encounter: Payer: Self-pay | Admitting: Cardiology

## 2016-11-30 ENCOUNTER — Ambulatory Visit (INDEPENDENT_AMBULATORY_CARE_PROVIDER_SITE_OTHER): Payer: Medicare Other | Admitting: *Deleted

## 2016-11-30 DIAGNOSIS — I429 Cardiomyopathy, unspecified: Secondary | ICD-10-CM | POA: Diagnosis not present

## 2016-11-30 NOTE — Progress Notes (Signed)
Remote ICD transmission.   

## 2016-12-02 LAB — CUP PACEART REMOTE DEVICE CHECK
Battery Remaining Longevity: 41 mo
Brady Statistic AP VP Percent: 88 %
Brady Statistic AP VS Percent: 1.1 %
Brady Statistic AS VP Percent: 11 %
Brady Statistic RA Percent Paced: 88 %
HighPow Impedance: 43 Ohm
HighPow Impedance: 44 Ohm
Implantable Lead Implant Date: 20140117
Implantable Lead Implant Date: 20140117
Implantable Lead Implant Date: 20140117
Implantable Lead Location: 753858
Implantable Lead Location: 753859
Implantable Pulse Generator Implant Date: 20140117
Lead Channel Impedance Value: 510 Ohm
Lead Channel Pacing Threshold Amplitude: 0.875 V
Lead Channel Pacing Threshold Amplitude: 1.125 V
Lead Channel Pacing Threshold Pulse Width: 0.5 ms
Lead Channel Pacing Threshold Pulse Width: 0.5 ms
Lead Channel Setting Pacing Amplitude: 1.875
Lead Channel Setting Pacing Amplitude: 2 V
Lead Channel Setting Pacing Pulse Width: 0.5 ms
Lead Channel Setting Pacing Pulse Width: 0.5 ms
Lead Channel Setting Sensing Sensitivity: 0.5 mV
MDC IDC LEAD LOCATION: 753860
MDC IDC MSMT BATTERY REMAINING PERCENTAGE: 44 %
MDC IDC MSMT BATTERY VOLTAGE: 2.92 V
MDC IDC MSMT LEADCHNL LV IMPEDANCE VALUE: 900 Ohm
MDC IDC MSMT LEADCHNL LV PACING THRESHOLD PULSEWIDTH: 0.5 ms
MDC IDC MSMT LEADCHNL RA PACING THRESHOLD AMPLITUDE: 0.875 V
MDC IDC MSMT LEADCHNL RA SENSING INTR AMPL: 5 mV
MDC IDC MSMT LEADCHNL RV IMPEDANCE VALUE: 480 Ohm
MDC IDC MSMT LEADCHNL RV SENSING INTR AMPL: 11.6 mV
MDC IDC PG SERIAL: 7070427
MDC IDC SESS DTM: 20180603124308
MDC IDC SET LEADCHNL RV PACING AMPLITUDE: 2.125
MDC IDC STAT BRADY AS VS PERCENT: 1 %

## 2016-12-04 ENCOUNTER — Other Ambulatory Visit: Payer: Self-pay | Admitting: Internal Medicine

## 2016-12-04 DIAGNOSIS — I5022 Chronic systolic (congestive) heart failure: Secondary | ICD-10-CM

## 2016-12-04 DIAGNOSIS — I429 Cardiomyopathy, unspecified: Secondary | ICD-10-CM

## 2016-12-04 DIAGNOSIS — I10 Essential (primary) hypertension: Secondary | ICD-10-CM

## 2016-12-07 NOTE — Telephone Encounter (Signed)
This is Dr. Crenshaw pt 

## 2016-12-08 ENCOUNTER — Encounter: Payer: Self-pay | Admitting: Cardiology

## 2016-12-11 ENCOUNTER — Other Ambulatory Visit: Payer: Self-pay | Admitting: Physician Assistant

## 2016-12-15 ENCOUNTER — Other Ambulatory Visit: Payer: Self-pay | Admitting: Physician Assistant

## 2016-12-15 DIAGNOSIS — R3915 Urgency of urination: Secondary | ICD-10-CM

## 2016-12-15 DIAGNOSIS — N401 Enlarged prostate with lower urinary tract symptoms: Secondary | ICD-10-CM

## 2016-12-15 DIAGNOSIS — R972 Elevated prostate specific antigen [PSA]: Secondary | ICD-10-CM

## 2016-12-15 DIAGNOSIS — R350 Frequency of micturition: Secondary | ICD-10-CM

## 2016-12-18 ENCOUNTER — Telehealth: Payer: Self-pay | Admitting: Cardiology

## 2016-12-18 NOTE — Progress Notes (Signed)
No ICM remote transmission received for 12/18/2016 and next ICM transmission scheduled for 12/29/2016.

## 2016-12-18 NOTE — Telephone Encounter (Signed)
LMOVM reminding pt to send remote transmission.   

## 2016-12-29 ENCOUNTER — Ambulatory Visit (INDEPENDENT_AMBULATORY_CARE_PROVIDER_SITE_OTHER): Payer: Medicare Other

## 2016-12-29 ENCOUNTER — Telehealth: Payer: Self-pay | Admitting: Cardiology

## 2016-12-29 DIAGNOSIS — I5022 Chronic systolic (congestive) heart failure: Secondary | ICD-10-CM

## 2016-12-29 DIAGNOSIS — Z95810 Presence of automatic (implantable) cardiac defibrillator: Secondary | ICD-10-CM

## 2016-12-29 NOTE — Telephone Encounter (Signed)
LMOVM reminding pt to send remote transmission.   

## 2016-12-30 ENCOUNTER — Other Ambulatory Visit: Payer: Self-pay | Admitting: Physician Assistant

## 2016-12-31 ENCOUNTER — Telehealth: Payer: Self-pay

## 2016-12-31 NOTE — Progress Notes (Signed)
EPIC Encounter for ICM Monitoring  Patient Name: Earl Gonzaga Sr. is a 76 y.o. male Date: 12/31/2016 Primary Care Physican: Lavada Mesi Primary Cardiologist:Crenshaw Electrophysiologist: Allred Dry Weight:unknown Bi-V Pacing: 98%        Attempted call to patient and unable to reach.  Transmission reviewed.    Thoracic impedance normal but was abnormal suggesting fluid accumulation from 11/28/2016 to 12/06/2016.  Prescribed dosage: Furosemide 40 mg 1.5 tablets (60 mg total) daily.    Labs: 07/03/2016 Creatinine 1.32, BUN 22, Potassium 4.9, Sodium 141 06/03/2016 Creatinine 1.53, BUN 43, Potassium 5.1, Sodium 139  04/20/2016 Creatinine 1.36, BUN 32, Potassium 5.0, Sodium 141  12/25/2015 Creatinine 1.22, BUN 31, Potassium 4.9, Sodium 139  08/07/2015 Creatinine 1.11, BUN 21, Potassium 4.5, Sodium 141  07/10/2015 Creatinine 1.14, BUN 19, Potassium 4.3, Sodium 141 .  Recommendations: NONE - Unable to reach patient   Follow-up plan: ICM clinic phone appointment on 02/02/2017.  Patient is overdue for appointment with Dr Rayann Heman.  Last visit was 06/2015  Copy of ICM check sent to device physician.   3 month ICM trend: 12/29/2016   1 Year ICM trend:      Rosalene Billings, RN 12/31/2016 8:28 AM

## 2016-12-31 NOTE — Telephone Encounter (Signed)
Remote ICM transmission received.  Attempted patient call and no answer or answering machine.  

## 2017-01-19 ENCOUNTER — Other Ambulatory Visit: Payer: Self-pay | Admitting: Physician Assistant

## 2017-01-22 ENCOUNTER — Other Ambulatory Visit: Payer: Self-pay | Admitting: Physician Assistant

## 2017-02-01 ENCOUNTER — Other Ambulatory Visit: Payer: Self-pay | Admitting: Physician Assistant

## 2017-02-02 ENCOUNTER — Telehealth: Payer: Self-pay

## 2017-02-02 NOTE — Telephone Encounter (Signed)
Attempted patient call to request ICM remote transmission. No answer or answering machine.

## 2017-02-06 ENCOUNTER — Other Ambulatory Visit: Payer: Self-pay | Admitting: Physician Assistant

## 2017-02-08 NOTE — Progress Notes (Signed)
No ICM remote transmission received for 02/02/2017 and next ICM transmission scheduled for 03/02/2017.

## 2017-02-09 ENCOUNTER — Telehealth: Payer: Self-pay

## 2017-02-09 MED ORDER — CARVEDILOL 25 MG PO TABS: 25.0000 mg | ORAL_TABLET | Freq: Two times a day (BID) | ORAL | 0 refills | Status: DC

## 2017-02-09 MED ORDER — METFORMIN HCL ER 750 MG PO TB24: ORAL_TABLET | ORAL | 0 refills | Status: DC

## 2017-02-09 NOTE — Telephone Encounter (Signed)
PT is seeing Evlyn Clines 8/15 to refill medication. PT has been out of his medication for 4 + days now and would like it refilled today.  Carvedilol 25 mg & Metformin ER 750MG    Pharmacy is on file [facility] in [place]

## 2017-02-09 NOTE — Addendum Note (Signed)
Addended by: Doree Albee on: 02/09/2017 01:29 PM   Modules accepted: Orders

## 2017-02-09 NOTE — Telephone Encounter (Signed)
Per PCP ok to refill for ONLY  30 days but patient needs a appointment for further refills. PATIENT VERBALLY UNDERSTANDS Earl White,CMA

## 2017-02-10 ENCOUNTER — Ambulatory Visit: Payer: Medicare Other | Admitting: Physician Assistant

## 2017-02-19 ENCOUNTER — Encounter: Payer: Medicare Other | Admitting: Physician Assistant

## 2017-02-19 DIAGNOSIS — Z0189 Encounter for other specified special examinations: Secondary | ICD-10-CM

## 2017-02-19 NOTE — Progress Notes (Signed)
This encounter was created in error - please disregard.

## 2017-03-02 ENCOUNTER — Ambulatory Visit (INDEPENDENT_AMBULATORY_CARE_PROVIDER_SITE_OTHER): Payer: Medicare Other | Admitting: *Deleted

## 2017-03-02 ENCOUNTER — Ambulatory Visit (INDEPENDENT_AMBULATORY_CARE_PROVIDER_SITE_OTHER): Payer: Medicare Other

## 2017-03-02 DIAGNOSIS — I5022 Chronic systolic (congestive) heart failure: Secondary | ICD-10-CM

## 2017-03-02 DIAGNOSIS — Z95810 Presence of automatic (implantable) cardiac defibrillator: Secondary | ICD-10-CM

## 2017-03-02 DIAGNOSIS — I429 Cardiomyopathy, unspecified: Secondary | ICD-10-CM

## 2017-03-02 NOTE — Progress Notes (Signed)
EPIC Encounter for ICM Monitoring  Patient Name: Earl Kina Sr. is a 76 y.o. male Date: 03/02/2017 Primary Care Physican: Lavada Mesi Primary Cardiologist:Crenshaw Electrophysiologist: Allred Dry Weight:unknown Bi-V Pacing: 98%       Heart Failure questions reviewed, pt asymptomatic.   Thoracic impedance abnormal suggesting fluid accumulation since 03/01/2017.  Patient ate high sodium foods for Labor Day weekend causing fluid accumulation  Prescribed dosage: Furosemide 40 mg 1.5 tablets (60 mg total) daily.   Labs: 07/03/2016 Creatinine 1.32, BUN 22, Potassium 4.9, Sodium 141 06/03/2016 Creatinine 1.53, BUN 43, Potassium 5.1, Sodium 139  04/20/2016 Creatinine 1.36, BUN 32, Potassium 5.0, Sodium 141  12/25/2015 Creatinine 1.22, BUN 31, Potassium 4.9, Sodium 139  08/07/2015 Creatinine 1.11, BUN 21, Potassium 4.5, Sodium 141  07/10/2015 Creatinine 1.14, BUN 19, Potassium 4.3, Sodium 141   Recommendations: Advised to limit salt intake to 2000 mg/day.  Copy of ICM check sent to Dr. Rayann Heman and Dr. Dr [name] for review and recommendations if needed.    Follow-up plan: ICM clinic phone appointment on 03/11/2017.   Overdue to make office appointment with Dr Rayann Heman (last visit 06/2015)   3 month ICM trend: 03/02/2017   1 Year ICM trend:      Rosalene Billings, RN 03/02/2017 8:17 AM

## 2017-03-04 NOTE — Progress Notes (Signed)
Remote ICD transmission.   

## 2017-03-05 ENCOUNTER — Ambulatory Visit (INDEPENDENT_AMBULATORY_CARE_PROVIDER_SITE_OTHER): Payer: Medicare Other | Admitting: Physician Assistant

## 2017-03-05 ENCOUNTER — Encounter: Payer: Self-pay | Admitting: Physician Assistant

## 2017-03-05 VITALS — BP 164/80 | HR 62 | Wt 244.0 lb

## 2017-03-05 DIAGNOSIS — I5023 Acute on chronic systolic (congestive) heart failure: Secondary | ICD-10-CM | POA: Diagnosis not present

## 2017-03-05 DIAGNOSIS — E0865 Diabetes mellitus due to underlying condition with hyperglycemia: Secondary | ICD-10-CM | POA: Diagnosis not present

## 2017-03-05 DIAGNOSIS — I420 Dilated cardiomyopathy: Secondary | ICD-10-CM | POA: Diagnosis not present

## 2017-03-05 DIAGNOSIS — M1000 Idiopathic gout, unspecified site: Secondary | ICD-10-CM

## 2017-03-05 DIAGNOSIS — I10 Essential (primary) hypertension: Secondary | ICD-10-CM

## 2017-03-05 DIAGNOSIS — N401 Enlarged prostate with lower urinary tract symptoms: Secondary | ICD-10-CM | POA: Diagnosis not present

## 2017-03-05 DIAGNOSIS — R972 Elevated prostate specific antigen [PSA]: Secondary | ICD-10-CM

## 2017-03-05 DIAGNOSIS — R350 Frequency of micturition: Secondary | ICD-10-CM

## 2017-03-05 DIAGNOSIS — E089 Diabetes mellitus due to underlying condition without complications: Secondary | ICD-10-CM

## 2017-03-05 LAB — POCT GLYCOSYLATED HEMOGLOBIN (HGB A1C): Hemoglobin A1C: 7

## 2017-03-05 LAB — POCT UA - MICROALBUMIN

## 2017-03-05 MED ORDER — ALLOPURINOL 100 MG PO TABS: ORAL_TABLET | ORAL | 5 refills | Status: DC

## 2017-03-05 MED ORDER — CARVEDILOL 25 MG PO TABS: 25.0000 mg | ORAL_TABLET | Freq: Two times a day (BID) | ORAL | 1 refills | Status: DC

## 2017-03-05 MED ORDER — METFORMIN HCL ER 750 MG PO TB24: ORAL_TABLET | ORAL | 0 refills | Status: DC

## 2017-03-05 MED ORDER — TAMSULOSIN HCL 0.4 MG PO CAPS: 0.4000 mg | ORAL_CAPSULE | Freq: Every day | ORAL | 1 refills | Status: DC

## 2017-03-05 NOTE — Progress Notes (Signed)
Subjective:    Patient ID: Earl Hatcher Sr., male    DOB: 1941/03/22, 76 y.o.   MRN: [id number]  HPI  Pt is a 76 yo male with CHF followed by cardiology who presents to the clinic to follow up on DM.   DM- not checking sugars. On metformin. Not exercising or eating like he should. No hypoglycemia. No open sores or wounds.   HTN- pt did not take his BP medications this morning. Overall he feels good. No CP, palpitations, headaches.   BPH- managed by urology.   .. Active Ambulatory Problems    Diagnosis Date Noted  . Essential hypertension, benign 04/28/2013  . Diabetes mellitus ([place]) 04/28/2013  . BPH (benign prostatic hyperplasia) 04/28/2013  . Gout 04/28/2013  . Congestive dilated cardiomyopathy ([place]) 05/10/2013  . ICD (implantable cardioverter-defibrillator) in place 05/10/2013  . Elevated PSA 11/24/2013  . Systolic dysfunction with acute on chronic heart failure ([place]) 09/03/2014  . Morbid obesity ([place]) 09/03/2014  . Erectile dysfunction 03/10/2015   Resolved Ambulatory Problems    Diagnosis Date Noted  . No Resolved Ambulatory Problems   Past Medical History:  Diagnosis Date  . BPH (benign prostatic hyperplasia)   . CHF (congestive heart failure) ([place])   . Diabetes mellitus ([place])   . Gout   . Hyperlipidemia   . Hypertension   . Nonischemic cardiomyopathy ([place])   . Paroxysmal atrial fibrillation ([place]) 12/25/15       Review of Systems  All other systems reviewed and are negative.      Objective:   Physical Exam  Constitutional: He is oriented to person, place, and time. He appears well-developed and well-nourished.  HENT:  Head: Normocephalic and atraumatic.  Cardiovascular: Normal rate and regular rhythm.   Pulmonary/Chest: Effort normal and breath sounds normal.  Neurological: He is alert and oriented to person, place, and time.  Psychiatric: He has a normal mood and affect. His behavior is normal.          Assessment & Plan:  Earl White KitchenMarland KitchenArgyle was seen  today for diabetes.  Diagnoses and all orders for this visit:  Congestive dilated cardiomyopathy ([place]) -     carvedilol (COREG) 25 MG tablet; Take 1 tablet (25 mg total) by mouth 2 (two) times daily with a meal.  Essential hypertension, benign -     carvedilol (COREG) 25 MG tablet; Take 1 tablet (25 mg total) by mouth 2 (two) times daily with a meal.  Elevated PSA -     tamsulosin (FLOMAX) 0.4 MG CAPS capsule; Take 1 capsule (0.4 mg total) by mouth daily.  Benign prostatic hyperplasia with urinary frequency -     tamsulosin (FLOMAX) 0.4 MG CAPS capsule; Take 1 capsule (0.4 mg total) by mouth daily.  Diabetes mellitus due to underlying condition, controlled, without complication, 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HCC) -     metFORMIN (GLUCOPHAGE-XR) 750 MG 24 hr tablet; TAKE 1 TABLET BY MOUTH ONCE DAILY WITH BREAKFAST -     POCT HgB A1C -     POCT UA - Microalbumin  Systolic dysfunction with acute on chronic heart failure (HCC) -     carvedilol (COREG) 25 MG tablet; Take 1 tablet (25 mg total) by mouth 2 (two) times daily with a meal.  Idiopathic gout, unspecified chronicity, unspecified site -     allopurinol (ZYLOPRIM) 100 MG tablet; TAKE ONE TABLET BY MOUTH TWICE DAILY.   The only cardiovascular medication not prescribed by cardiology is coreg. Refilled today. Consider having  cardiology take this over.   Pt did not take BP medication this am. He has had regular follow up visits and all have been normal.   .. Results for orders placed or performed in visit on 03/05/17  POCT HgB A1C  Result Value Ref Range   Hemoglobin A1C 7.0   POCT UA - Microalbumin  Result Value Ref Range   Microalbumin Ur, POC 150mg /l mg/L   Creatinine, POC 300mg /dl mg/dL   Albumin/Creatinine Ratio, Urine, POC 30-300mg /g    A!C still at goal but discussed diet and exercise to keep below 7.  Per patient eye exam done at [facility] in [place] will call to get copy.  On STATN. On ASA. On  ARB. Follow up in 3 months.   flomax rx. Has appt with urologist once a year.    Pt declined flu shot and pneumonia vaccine.

## 2017-03-07 ENCOUNTER — Telehealth: Payer: Self-pay | Admitting: Physician Assistant

## 2017-03-07 ENCOUNTER — Encounter: Payer: Self-pay | Admitting: Physician Assistant

## 2017-03-07 NOTE — Telephone Encounter (Signed)
Need eye exam from [facility].

## 2017-03-10 ENCOUNTER — Encounter: Payer: Self-pay | Admitting: Cardiology

## 2017-03-11 ENCOUNTER — Telehealth: Payer: Self-pay

## 2017-03-11 NOTE — Telephone Encounter (Signed)
Ok we can just document attempt to get eye exam and will discuss at next visit. Thanks.

## 2017-03-11 NOTE — Telephone Encounter (Signed)
Called and patient has not had an eye exam there at least not in the past three years per technician that was there. He has only purchased glasses there.  Called patient and no answer and no vm

## 2017-03-11 NOTE — Telephone Encounter (Signed)
Attempted ICM call to request that he send manual transmission and no answer or answering machine.

## 2017-03-12 LAB — CUP PACEART REMOTE DEVICE CHECK
Battery Remaining Percentage: 40 %
Date Time Interrogation Session: 20180914151652
HIGH POWER IMPEDANCE MEASURED VALUE: 41 Ohm
Implantable Lead Implant Date: 20140117
Implantable Lead Implant Date: 20140117
Implantable Lead Location: 753860
Lead Channel Impedance Value: 480 Ohm
Lead Channel Pacing Threshold Amplitude: 0.75 V
Lead Channel Pacing Threshold Amplitude: 0.75 V
Lead Channel Pacing Threshold Pulse Width: 0.5 ms
Lead Channel Pacing Threshold Pulse Width: 0.5 ms
Lead Channel Sensing Intrinsic Amplitude: 5 mV
Lead Channel Setting Pacing Amplitude: 2.125
Lead Channel Setting Sensing Sensitivity: 0.5 mV
MDC IDC LEAD IMPLANT DT: 20140117
MDC IDC LEAD LOCATION: 753858
MDC IDC LEAD LOCATION: 753859
MDC IDC MSMT BATTERY REMAINING LONGEVITY: 37 mo
MDC IDC MSMT LEADCHNL LV IMPEDANCE VALUE: 850 Ohm
MDC IDC MSMT LEADCHNL RV IMPEDANCE VALUE: 480 Ohm
MDC IDC MSMT LEADCHNL RV PACING THRESHOLD AMPLITUDE: 1.125 V
MDC IDC MSMT LEADCHNL RV PACING THRESHOLD PULSEWIDTH: 0.5 ms
MDC IDC MSMT LEADCHNL RV SENSING INTR AMPL: 11.6 mV
MDC IDC PG IMPLANT DT: 20140117
MDC IDC SET LEADCHNL LV PACING AMPLITUDE: 2 V
MDC IDC SET LEADCHNL LV PACING PULSEWIDTH: 0.5 ms
MDC IDC SET LEADCHNL RA PACING AMPLITUDE: 1.875
MDC IDC SET LEADCHNL RV PACING PULSEWIDTH: 0.5 ms
MDC IDC STAT BRADY RA PERCENT PACED: 89 %
MDC IDC STAT BRADY RV PERCENT PACED: 98 %
Pulse Gen Serial Number: 7070427

## 2017-03-12 NOTE — Progress Notes (Signed)
No ICM remote transmission received for 03/11/2017 and next ICM transmission scheduled for 04/12/2017.   

## 2017-04-12 ENCOUNTER — Ambulatory Visit (INDEPENDENT_AMBULATORY_CARE_PROVIDER_SITE_OTHER): Payer: Medicare Other

## 2017-04-12 DIAGNOSIS — I5022 Chronic systolic (congestive) heart failure: Secondary | ICD-10-CM

## 2017-04-12 DIAGNOSIS — Z95810 Presence of automatic (implantable) cardiac defibrillator: Secondary | ICD-10-CM

## 2017-04-12 NOTE — Progress Notes (Signed)
EPIC Encounter for ICM Monitoring  Patient Name: Jenny Omdahl Sr. is a 76 y.o. male Date: 04/12/2017 Primary Care Physican: Lavada Mesi Primary Cardiologist:Crenshaw Electrophysiologist: Allred Dry Weight: unknown Bi-V Pacing: 98%       Attempted call to patient and unable to reach.   Transmission reviewed.    Thoracic impedance normal.  Prescribed dosage: Furosemide 40 mg 1.5 tablets (60 mg total) daily.   Labs: 07/03/2016 Creatinine 1.32, BUN 22, Potassium 4.9, Sodium 141 06/03/2016 Creatinine 1.53, BUN 43, Potassium 5.1, Sodium 139  04/20/2016 Creatinine 1.36, BUN 32, Potassium 5.0, Sodium 141  12/25/2015 Creatinine 1.22, BUN 31, Potassium 4.9, Sodium 139  08/07/2015 Creatinine 1.11, BUN 21, Potassium 4.5, Sodium 141  07/10/2015 Creatinine 1.14, BUN 19, Potassium 4.3, Sodium 141   Recommendations: NONE - Unable to reach patient   Follow-up plan: ICM clinic phone appointment on 05/13/2017.    Copy of ICM check sent to Dr. Rayann Heman.   3 month ICM trend: 04/12/2017   1 Year ICM trend:      Rosalene Billings, RN 04/12/2017 2:12 PM

## 2017-04-13 ENCOUNTER — Telehealth: Payer: Self-pay

## 2017-04-13 NOTE — Telephone Encounter (Signed)
Remote ICM transmission received.  Attempted call to patient and recording stated person you are trying to reach is not accepting calls at this time.

## 2017-05-13 ENCOUNTER — Ambulatory Visit (INDEPENDENT_AMBULATORY_CARE_PROVIDER_SITE_OTHER): Payer: Medicare Other

## 2017-05-13 ENCOUNTER — Other Ambulatory Visit: Payer: Self-pay | Admitting: Cardiology

## 2017-05-13 DIAGNOSIS — Z95810 Presence of automatic (implantable) cardiac defibrillator: Secondary | ICD-10-CM | POA: Diagnosis not present

## 2017-05-13 DIAGNOSIS — I5022 Chronic systolic (congestive) heart failure: Secondary | ICD-10-CM | POA: Diagnosis not present

## 2017-05-13 DIAGNOSIS — I420 Dilated cardiomyopathy: Secondary | ICD-10-CM

## 2017-05-14 NOTE — Progress Notes (Signed)
EPIC Encounter for ICM Monitoring  Patient Name: Earl Pavlak Sr. is a 76 y.o. male Date: 05/14/2017 Primary Care Physican: Lavada Mesi Primary Cardiologist:Crenshaw Electrophysiologist: Allred Dry Weight:does not weigh at home Bi-V Pacing: 98%        Heart Failure questions reviewed, pt asymptomatic.  Patient traveling to [place] for Thanksgiving.  Last office visit weight was 244 lbs   Thoracic impedance normal.  Prescribed dosage: Furosemide 40 mg 1.5 tablets (60 mg total) daily.   Labs: 07/03/2016 Creatinine 1.32, BUN 22, Potassium 4.9, Sodium 141 06/03/2016 Creatinine 1.53, BUN 43, Potassium 5.1, Sodium 139  04/20/2016 Creatinine 1.36, BUN 32, Potassium 5.0, Sodium 141  12/25/2015 Creatinine 1.22, BUN 31, Potassium 4.9, Sodium 139  08/07/2015 Creatinine 1.11, BUN 21, Potassium 4.5, Sodium 141  07/10/2015 Creatinine 1.14, BUN 19, Potassium 4.3, Sodium 141   Recommendations: No changes.  Encouraged to call for fluid symptoms.  Follow-up plan: ICM clinic phone appointment on 06/14/2017.  Advised to call office to schedule an appointment with Dr [name]  Copy of ICM check sent to Dr. Rayann Heman.   3 month ICM trend: 05/13/2017    1 Year ICM trend:       Rosalene Billings, RN 05/14/2017 12:35 PM

## 2017-06-03 ENCOUNTER — Other Ambulatory Visit: Payer: Self-pay

## 2017-06-14 ENCOUNTER — Ambulatory Visit (INDEPENDENT_AMBULATORY_CARE_PROVIDER_SITE_OTHER): Payer: Medicare Other | Admitting: *Deleted

## 2017-06-14 DIAGNOSIS — I429 Cardiomyopathy, unspecified: Secondary | ICD-10-CM

## 2017-06-14 DIAGNOSIS — Z95810 Presence of automatic (implantable) cardiac defibrillator: Secondary | ICD-10-CM

## 2017-06-14 DIAGNOSIS — I5022 Chronic systolic (congestive) heart failure: Secondary | ICD-10-CM

## 2017-06-14 NOTE — Progress Notes (Signed)
Remote ICD transmission.   

## 2017-06-14 NOTE — Progress Notes (Signed)
EPIC Encounter for ICM Monitoring  Patient Name: Earl Orrego Sr. is a 76 y.o. male Date: 06/14/2017 Primary Care Physican: Lavada Mesi Primary Cardiologist:Crenshaw Electrophysiologist: Allred Dry Weight: unknown Bi-V Pacing: 98%             Attempted call to patient and unable to reach.   Transmission reviewed.    Thoracic impedance normal.  Prescribed dosage: Furosemide 40 mg 1.5 tablets (60 mg total) daily.   Labs: 07/03/2016 Creatinine 1.32, BUN 22, Potassium 4.9, Sodium 141 06/03/2016 Creatinine 1.53, BUN 43, Potassium 5.1, Sodium 139  04/20/2016 Creatinine 1.36, BUN 32, Potassium 5.0, Sodium 141  12/25/2015 Creatinine 1.22, BUN 31, Potassium 4.9, Sodium 139  08/07/2015 Creatinine 1.11, BUN 21, Potassium 4.5, Sodium 141  07/10/2015 Creatinine 1.14, BUN 19, Potassium 4.3, Sodium 141   Recommendations:  NONE - Unable to reach.  Follow-up plan: ICM clinic phone appointment on 07/15/2017.    Copy of ICM check sent to Dr. Rayann Heman.   3 month ICM trend: 06/14/2017    1 Year ICM trend:       Rosalene Billings, RN 06/14/2017 3:26 PM

## 2017-06-16 ENCOUNTER — Encounter: Payer: Self-pay | Admitting: Cardiology

## 2017-06-16 LAB — CUP PACEART REMOTE DEVICE CHECK
Battery Remaining Longevity: 36 mo
Battery Remaining Percentage: 38 %
Brady Statistic AP VP Percent: 88 %
Brady Statistic AS VS Percent: 1 %
Date Time Interrogation Session: 20181217090035
HIGH POWER IMPEDANCE MEASURED VALUE: 45 Ohm
HIGH POWER IMPEDANCE MEASURED VALUE: 45 Ohm
Implantable Lead Implant Date: 20140117
Implantable Lead Location: 753858
Lead Channel Impedance Value: 480 Ohm
Lead Channel Pacing Threshold Amplitude: 0.875 V
Lead Channel Pacing Threshold Pulse Width: 0.5 ms
Lead Channel Pacing Threshold Pulse Width: 0.5 ms
Lead Channel Sensing Intrinsic Amplitude: 5 mV
Lead Channel Setting Pacing Amplitude: 2 V
Lead Channel Setting Pacing Amplitude: 2 V
Lead Channel Setting Pacing Pulse Width: 0.5 ms
MDC IDC LEAD IMPLANT DT: 20140117
MDC IDC LEAD IMPLANT DT: 20140117
MDC IDC LEAD LOCATION: 753859
MDC IDC LEAD LOCATION: 753860
MDC IDC MSMT BATTERY VOLTAGE: 2.9 V
MDC IDC MSMT LEADCHNL LV IMPEDANCE VALUE: 890 Ohm
MDC IDC MSMT LEADCHNL LV PACING THRESHOLD AMPLITUDE: 0.75 V
MDC IDC MSMT LEADCHNL LV PACING THRESHOLD PULSEWIDTH: 0.5 ms
MDC IDC MSMT LEADCHNL RA IMPEDANCE VALUE: 510 Ohm
MDC IDC MSMT LEADCHNL RV PACING THRESHOLD AMPLITUDE: 1 V
MDC IDC MSMT LEADCHNL RV SENSING INTR AMPL: 11.6 mV
MDC IDC PG IMPLANT DT: 20140117
MDC IDC PG SERIAL: 7070427
MDC IDC SET LEADCHNL RA PACING AMPLITUDE: 1.875
MDC IDC SET LEADCHNL RV PACING PULSEWIDTH: 0.5 ms
MDC IDC SET LEADCHNL RV SENSING SENSITIVITY: 0.5 mV
MDC IDC STAT BRADY AP VS PERCENT: 1.2 %
MDC IDC STAT BRADY AS VP PERCENT: 11 %
MDC IDC STAT BRADY RA PERCENT PACED: 88 %

## 2017-06-25 ENCOUNTER — Other Ambulatory Visit: Payer: Self-pay | Admitting: Physician Assistant

## 2017-06-25 DIAGNOSIS — E089 Diabetes mellitus due to underlying condition without complications: Secondary | ICD-10-CM

## 2017-07-07 ENCOUNTER — Other Ambulatory Visit: Payer: Self-pay | Admitting: Cardiology

## 2017-07-07 ENCOUNTER — Telehealth: Payer: Self-pay | Admitting: Cardiology

## 2017-07-07 DIAGNOSIS — I420 Dilated cardiomyopathy: Secondary | ICD-10-CM

## 2017-07-07 NOTE — Telephone Encounter (Signed)
°*  STAT* If patient is at the pharmacy, call can be transferred to refill team.   1. Which medications need to be refilled? (please list name of each medication and dose if known) Entresto  2. Which pharmacy/location (including street and city if local pharmacy) is medication to be sent to?[facility] [phone]  3. Do they need a 30 day or 90 day supply? 180 and refills

## 2017-07-07 NOTE — Telephone Encounter (Signed)
Wrong provider

## 2017-07-08 MED ORDER — SACUBITRIL-VALSARTAN 97-103 MG PO TABS: 1.0000 | ORAL_TABLET | Freq: Two times a day (BID) | ORAL | 0 refills | Status: DC

## 2017-07-15 ENCOUNTER — Ambulatory Visit (INDEPENDENT_AMBULATORY_CARE_PROVIDER_SITE_OTHER): Payer: Medicare Other

## 2017-07-15 DIAGNOSIS — Z95810 Presence of automatic (implantable) cardiac defibrillator: Secondary | ICD-10-CM | POA: Diagnosis not present

## 2017-07-15 DIAGNOSIS — I5022 Chronic systolic (congestive) heart failure: Secondary | ICD-10-CM

## 2017-07-15 NOTE — Progress Notes (Signed)
EPIC Encounter for ICM Monitoring  Patient Name: Earl Garriga Sr. is a 77 y.o. male Date: 07/15/2017 Primary Care Physican: Lavada Mesi Primary Cardiologist:Crenshaw Electrophysiologist: Allred Dry Weight: unknown Bi-V Pacing: 98%  VT/VF Non-sustained Episodes: 9  Since 06/14/2017      Attempted call to patient and unable to reach.   Transmission reviewed.    Thoracic impedance abnormal suggesting fluid accumulation starting 07/13/2017.  Prescribed dosage: Furosemide 40 mg 1.5 tablets (60 mg total) daily.   Labs: 07/03/2016 Creatinine 1.32, BUN 22, Potassium 4.9, Sodium 141 06/03/2016 Creatinine 1.53, BUN 43, Potassium 5.1, Sodium 139  04/20/2016 Creatinine 1.36, BUN 32, Potassium 5.0, Sodium 141  12/25/2015 Creatinine 1.22, BUN 31, Potassium 4.9, Sodium 139  08/07/2015 Creatinine 1.11, BUN 21, Potassium 4.5, Sodium 141  07/10/2015 Creatinine 1.14, BUN 19, Potassium 4.3, Sodium 141  Recommendations: NONE - Unable to reach.  Follow-up plan: ICM clinic phone appointment on 07/23/2017 to recheck fluid levels.  Office appointment scheduled 09/22/2017 with Dr. Martinique.  Copy of ICM check sent to Dr. Martinique and Dr. Rayann Heman for review and recommendations if needed.   3 month ICM trend: 07/14/2017    1 Year ICM trend:       Rosalene Billings, RN 07/15/2017 7:48 AM

## 2017-07-16 ENCOUNTER — Telehealth: Payer: Self-pay

## 2017-07-16 NOTE — Telephone Encounter (Signed)
Remote ICM transmission received.  Attempted call to patient and no answer or answering machine.  

## 2017-07-23 ENCOUNTER — Telehealth: Payer: Self-pay

## 2017-07-23 ENCOUNTER — Ambulatory Visit (INDEPENDENT_AMBULATORY_CARE_PROVIDER_SITE_OTHER): Payer: Self-pay

## 2017-07-23 DIAGNOSIS — Z95810 Presence of automatic (implantable) cardiac defibrillator: Secondary | ICD-10-CM

## 2017-07-23 DIAGNOSIS — I5022 Chronic systolic (congestive) heart failure: Secondary | ICD-10-CM

## 2017-07-23 NOTE — Progress Notes (Signed)
EPIC Encounter for ICM Monitoring  Patient Name: Earl Boening Sr. is a 77 y.o. male Date: 07/23/2017 Primary Care Physican: Lavada Mesi Primary Cardiologist:Crenshaw Electrophysiologist: Allred Dry Weight: unknown Bi-V Pacing: 98%       Attempted call to patient and unable to reach.    Transmission reviewed.    Thoracic impedance normal.  Prescribed dosage: Furosemide 40 mg 1.5 tablets (60 mg total) daily.   Labs: 07/03/2016 Creatinine 1.32, BUN 22, Potassium 4.9, Sodium 141 06/03/2016 Creatinine 1.53, BUN 43, Potassium 5.1, Sodium 139  04/20/2016 Creatinine 1.36, BUN 32, Potassium 5.0, Sodium 141  12/25/2015 Creatinine 1.22, BUN 31, Potassium 4.9, Sodium 139  08/07/2015 Creatinine 1.11, BUN 21, Potassium 4.5, Sodium 141  07/10/2015 Creatinine 1.14, BUN 19, Potassium 4.3, Sodium 141  Recommendations: NONE - Unable to reach.  Follow-up plan: ICM clinic phone appointment on 08/16/2017.  Office appointment scheduled 09/22/2017 with Dr. Martinique.  Copy of ICM check sent to Dr. Rayann Heman.   3 month ICM trend: 07/23/2017    1 Year ICM trend:       Rosalene Billings, RN 07/23/2017 9:56 AM

## 2017-07-23 NOTE — Telephone Encounter (Signed)
Remote ICM transmission received.  Attempted call to patient and no answer or answering machine.  

## 2017-08-16 ENCOUNTER — Ambulatory Visit (INDEPENDENT_AMBULATORY_CARE_PROVIDER_SITE_OTHER): Payer: Medicare Other

## 2017-08-16 DIAGNOSIS — Z95810 Presence of automatic (implantable) cardiac defibrillator: Secondary | ICD-10-CM

## 2017-08-16 DIAGNOSIS — I5022 Chronic systolic (congestive) heart failure: Secondary | ICD-10-CM

## 2017-08-16 NOTE — Progress Notes (Signed)
EPIC Encounter for ICM Monitoring  Patient Name: Earl Goth Sr. is a 77 y.o. male Date: 08/16/2017 Primary Care Physican: Lavada Mesi Primary Cardiologist:Crenshaw Electrophysiologist: Allred Dry Weight:237 lbs Bi-V Pacing: 98%      Heart Failure questions reviewed, pt asymptomatic.   Thoracic impedance normal.  Prescribed dosage: Furosemide 40 mg 1.5 tablets (60 mg total) daily.   Labs: 07/03/2016 Creatinine 1.32, BUN 22, Potassium 4.9, Sodium 141 06/03/2016 Creatinine 1.53, BUN 43, Potassium 5.1, Sodium 139  04/20/2016 Creatinine 1.36, BUN 32, Potassium 5.0, Sodium 141  12/25/2015 Creatinine 1.22, BUN 31, Potassium 4.9, Sodium 139  08/07/2015 Creatinine 1.11, BUN 21, Potassium 4.5, Sodium 141  07/10/2015 Creatinine 1.14, BUN 19, Potassium 4.3, Sodium 141  Recommendations: No changes.  Patient stated eating healthier diet.  He is blending fruits and vegetable drinks and feel better.  Follow-up plan: ICM clinic phone appointment on 09/16/2017.  Office appointment scheduled 09/22/2017 with Dr. [name].  Copy of ICM check sent to Dr. Rayann Heman.   3 month ICM trend: 08/16/2017    1 Year ICM trend:       Rosalene Billings, RN 08/16/2017 2:16 PM

## 2017-08-28 ENCOUNTER — Other Ambulatory Visit: Payer: Self-pay | Admitting: Cardiology

## 2017-09-10 ENCOUNTER — Other Ambulatory Visit: Payer: Self-pay | Admitting: Cardiology

## 2017-09-10 DIAGNOSIS — I420 Dilated cardiomyopathy: Secondary | ICD-10-CM

## 2017-09-13 NOTE — Telephone Encounter (Signed)
Rx has been sent to the pharmacy electronically. ° °

## 2017-09-14 NOTE — Progress Notes (Signed)
HPI: FU congestive heart failure. Patient previously resided in [place] and then [place]. He apparently was diagnosed with a cardiomyopathy. He does not know the etiology and denies catheterization. He has had previous CRT-D. He moved here in March of 2014. Last echocardiogram April 2016 showed ejection fraction 25-30%. There was grade 1 diastolic dysfunction. Left atrium is mildly dilated. Patient agreed to nuclear study April 2017. This showed ejection fraction 24% There was a large inferior lateral scar with minimal peri-infarct ischemia.Patient did not want to pursue cardiac catheterization.Since last seen,  patient notes mild increased dyspnea when climbing stairs but denies orthopnea or PND.  Note he recently traveled to [place] in [place].  He has noticed increased edema left lower extremity only.  He did state he had missed some Lasix when traveling.  He took additional Lasix upon returning and there has been some improvement in his left lower extremity edema.  He denies chest pain or syncope.  Current Outpatient Medications  Medication Sig Dispense Refill  . aspirin 81 MG tablet Take 81 mg by mouth daily.    . carvedilol (COREG) 25 MG tablet Take 1 tablet (25 mg total) by mouth 2 (two) times daily with a meal. 180 tablet 1  . ENTRESTO 97-103 MG TAKE 1 TABLET BY MOUTH TWICE DAILY 60 tablet 0  . furosemide (LASIX) 40 MG tablet Take 1.5 tablets (60 mg total) by mouth daily. 270 tablet 3  . metFORMIN (GLUCOPHAGE-XR) 750 MG 24 hr tablet TAKE 1 TABLET BY MOUTH ONCE DAILY WITH BREAKFAST. 90 tablet 0  . sennosides-docusate sodium (SENOKOT-S) 8.6-50 MG tablet Take 1 tablet by mouth daily as needed. For constipation    . simvastatin (ZOCOR) 40 MG tablet TAKE ONE TABLET BY MOUTH ONCE DAILY 90 tablet 3   No current facility-administered medications for this visit.      Past Medical History:  Diagnosis Date  . BPH (benign prostatic hyperplasia)   . CHF (congestive heart  failure) ([place])   . 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 Gout   . Hyperlipidemia   . Hypertension   . Nonischemic cardiomyopathy ([place])    a. s/p STJ CRTD  . Paroxysmal atrial fibrillation ([place]) 12/25/15    Past Surgical History:  Procedure Laterality Date  . CARDIAC DEFIBRILLATOR PLACEMENT  Jan 2014   SJM Quadra Assura implanted by Dr [name] in [place] History   Socioeconomic History  . Marital status: Married    Spouse name: Not on file  . Number of children: 1  . Years of education: Not on file  . Highest education level: Not on file  Occupational History  . Not on file  Social Needs  . Financial resource strain: Not on file  . Food insecurity:    Worry: Not on file    Inability: Not on file  . Transportation needs:    Medical: Not on file    Non-medical: Not on file  Tobacco Use  . Smoking status: Never Smoker  . Smokeless tobacco: Never Used  Substance and Sexual Activity  . Alcohol use: Yes    Comment: Occasional  . Drug use: No  . Sexual activity: Yes  Lifestyle  . Physical activity:    Days per week: Not on file    Minutes per session: Not on file  . Stress: Not on file  Relationships  . Social connections:    Talks on phone: Not  on file    Gets together: Not on file    Attends religious service: Not on file    Active member of club or organization: Not on file    Attends meetings of clubs or organizations: Not on file    Relationship status: Not on file  . Intimate partner violence:    Fear of current or ex partner: Not on file    Emotionally abused: Not on file    Physically abused: Not on file    Forced sexual activity: Not on file  Other Topics Concern  . Not on file  Social History Narrative   Previously lived in [place] before moving to [place].  He has recently relocated to [place].    Family History  Problem Relation Age of Onset  . Diabetes Mother   . Hyperlipidemia Mother   . Diabetes  Brother     ROS: no fevers or chills, productive cough, hemoptysis, dysphasia, odynophagia, melena, hematochezia, dysuria, hematuria, rash, seizure activity, orthopnea, PND, claudication. Remaining systems are negative.  Physical Exam: Well-developed well-nourished in no acute distress.  Skin is warm and dry.  HEENT is normal.  Neck is supple.  Chest is clear to auscultation with normal expansion.  Cardiovascular exam is regular rate and rhythm.  Abdominal exam nontender or distended. No masses palpated. Extremities show 1+ edema on the left with increased diameter.  Trace edema right. neuro grossly intact  ECG-sinus rhythm at a rate of 60.  Left bundle branch block.  Personally reviewed  A/P  1 ischemic cardiomyopathy-continue Entresto and carvedilol.  Check potassium and renal function.  2 chronic systolic congestive heart failure-patient is mildly volume overloaded; continue present dose of lasix (pt missed recent doses due to travel); add spironolactone 12.5 mg daily; bmet one week.  3 hypertension-blood pressure is controlled.  Continue present medications.  4 presumed coronary artery disease-this is based on prior abnormal nuclear study.  Patient does not want to pursue cardiac catheterization.  Continue aspirin and statin.  5 prior ICD-followed by electrophysiology.  6 left lower extremity edema-patient with recent travel and unilateral lower extremity edema.  I am concerned about DVT.  We called about a lower extremity venous Doppler.  We were unable to schedule until tomorrow.  I will give apixaban 5 mg twice daily for 4 doses.  Check venous Dopplers tomorrow and if DVT present we will continue apixaban.  Kirk Ruths, MD

## 2017-09-16 ENCOUNTER — Telehealth: Payer: Self-pay | Admitting: Cardiology

## 2017-09-16 ENCOUNTER — Ambulatory Visit (INDEPENDENT_AMBULATORY_CARE_PROVIDER_SITE_OTHER): Payer: Medicare Other | Admitting: *Deleted

## 2017-09-16 DIAGNOSIS — Z95810 Presence of automatic (implantable) cardiac defibrillator: Secondary | ICD-10-CM

## 2017-09-16 DIAGNOSIS — I5022 Chronic systolic (congestive) heart failure: Secondary | ICD-10-CM | POA: Diagnosis not present

## 2017-09-16 DIAGNOSIS — I429 Cardiomyopathy, unspecified: Secondary | ICD-10-CM

## 2017-09-16 NOTE — Telephone Encounter (Signed)
Attempted to confirm remote transmission with pt. No answer and was unable to leave a message.   

## 2017-09-17 ENCOUNTER — Encounter: Payer: Self-pay | Admitting: Cardiology

## 2017-09-17 NOTE — Progress Notes (Signed)
EPIC Encounter for ICM Monitoring  Patient Name: Earl Moone Sr. is a 77 y.o. male Date: 09/17/2017 Primary Care Physican: Lavada Mesi Primary Cardiologist:Crenshaw Electrophysiologist: Allred Dry Weight:237 lbs Bi-V Pacing: 98%          Heart Failure questions reviewed, pt symptomatic with 2 lbs weight gain in the past week.  Patient was traveling for the past week and was not on low salt diet.  This correlates with decreased impedance.    Thoracic impedance abnormal suggesting fluid accumulation 09/07/2017.  Prescribed dosage: Furosemide 40 mg 1.5 tablets (60 mg total) daily.   Labs: 07/03/2016 Creatinine 1.32, BUN 22, Potassium 4.9, Sodium 141 06/03/2016 Creatinine 1.53, BUN 43, Potassium 5.1, Sodium 139  04/20/2016 Creatinine 1.36, BUN 32, Potassium 5.0, Sodium 141  12/25/2015 Creatinine 1.22, BUN 31, Potassium 4.9, Sodium 139  08/07/2015 Creatinine 1.11, BUN 21, Potassium 4.5, Sodium 141  07/10/2015 Creatinine 1.14, BUN 19, Potassium 4.3, Sodium 141  Recommendations:  He has resumed his low salt diet after arriving home today from vacation.  Encouraged to call either on call physician this weekend or use ER if needed for fluid symptoms.  Follow-up plan: ICM clinic phone appointment on 09/21/2017 (manual send).  Office appointment scheduled 09/22/2017 with Dr. [name].  Copy of ICM check sent to Dr. [name] and Dr. Rayann Heman.   3 month ICM trend: 09/17/2017    1 Year ICM trend:       Rosalene Billings, RN 09/17/2017 4:20 PM

## 2017-09-17 NOTE — Progress Notes (Signed)
Remote ICD transmission.   

## 2017-09-20 LAB — CUP PACEART REMOTE DEVICE CHECK
Brady Statistic AP VP Percent: 87 %
Brady Statistic AP VS Percent: 1.2 %
Brady Statistic AS VP Percent: 11 %
Brady Statistic AS VS Percent: 1 %
HighPow Impedance: 40 Ohm
HighPow Impedance: 41 Ohm
Implantable Lead Implant Date: 20140117
Implantable Lead Implant Date: 20140117
Implantable Lead Implant Date: 20140117
Implantable Lead Location: 753858
Implantable Lead Location: 753859
Implantable Lead Location: 753860
Lead Channel Impedance Value: 430 Ohm
Lead Channel Impedance Value: 800 Ohm
Lead Channel Pacing Threshold Amplitude: 0.75 V
Lead Channel Pacing Threshold Amplitude: 1.125 V
Lead Channel Pacing Threshold Pulse Width: 0.5 ms
Lead Channel Sensing Intrinsic Amplitude: 11.6 mV
Lead Channel Setting Pacing Amplitude: 1.75 V
Lead Channel Setting Pacing Amplitude: 2 V
Lead Channel Setting Pacing Amplitude: 2.125
Lead Channel Setting Sensing Sensitivity: 0.5 mV
MDC IDC MSMT BATTERY REMAINING LONGEVITY: 31 mo
MDC IDC MSMT BATTERY REMAINING PERCENTAGE: 34 %
MDC IDC MSMT BATTERY VOLTAGE: 2.9 V
MDC IDC MSMT LEADCHNL LV PACING THRESHOLD PULSEWIDTH: 0.5 ms
MDC IDC MSMT LEADCHNL RA IMPEDANCE VALUE: 450 Ohm
MDC IDC MSMT LEADCHNL RA PACING THRESHOLD AMPLITUDE: 0.75 V
MDC IDC MSMT LEADCHNL RA SENSING INTR AMPL: 5 mV
MDC IDC MSMT LEADCHNL RV PACING THRESHOLD PULSEWIDTH: 0.5 ms
MDC IDC PG IMPLANT DT: 20140117
MDC IDC PG SERIAL: 7070427
MDC IDC SESS DTM: 20190322103020
MDC IDC SET LEADCHNL LV PACING PULSEWIDTH: 0.5 ms
MDC IDC SET LEADCHNL RV PACING PULSEWIDTH: 0.5 ms
MDC IDC STAT BRADY RA PERCENT PACED: 88 %

## 2017-09-21 ENCOUNTER — Telehealth: Payer: Self-pay | Admitting: Cardiology

## 2017-09-21 NOTE — Telephone Encounter (Signed)
Spoke with pt and reminded pt of remote transmission that is due today. Pt verbalized understanding.   

## 2017-09-22 ENCOUNTER — Encounter: Payer: Self-pay | Admitting: Cardiology

## 2017-09-22 ENCOUNTER — Ambulatory Visit (INDEPENDENT_AMBULATORY_CARE_PROVIDER_SITE_OTHER): Payer: Medicare Other | Admitting: Cardiology

## 2017-09-22 VITALS — BP 130/67 | HR 60 | Ht 70.0 in | Wt 235.4 lb

## 2017-09-22 DIAGNOSIS — I5023 Acute on chronic systolic (congestive) heart failure: Secondary | ICD-10-CM | POA: Diagnosis not present

## 2017-09-22 DIAGNOSIS — I5022 Chronic systolic (congestive) heart failure: Secondary | ICD-10-CM

## 2017-09-22 DIAGNOSIS — I82409 Acute embolism and thrombosis of unspecified deep veins of unspecified lower extremity: Secondary | ICD-10-CM | POA: Diagnosis not present

## 2017-09-22 DIAGNOSIS — I10 Essential (primary) hypertension: Secondary | ICD-10-CM | POA: Diagnosis not present

## 2017-09-22 MED ORDER — SPIRONOLACTONE 25 MG PO TABS: 12.5000 mg | ORAL_TABLET | Freq: Every day | ORAL | 9 refills | Status: DC

## 2017-09-22 MED ORDER — APIXABAN 5 MG PO TABS: 5.0000 mg | ORAL_TABLET | Freq: Two times a day (BID) | ORAL | 0 refills | Status: DC

## 2017-09-22 NOTE — Patient Instructions (Signed)
Medication Instructions:  Your physician has recommended you make the following change in your medication:  1. Start Aldactone one half tablet (12.5 mg ) daily 2. Start eliquis (5 mg ) twice daily, patient will take one pill tonight, one tomorrow am, one tomorrow pm and will hear from office weather to stop or continue based on LE venous test.  Pt was sent in today a 30 day supply, no refills.    Labwork: Your physician recommends that you return for lab work in: 1 week for a bmet.    Testing/Procedures: Your physician has requested that you have a lower or upper extremity venous duplex. This test is an ultrasound of the veins in the legs or arms. It looks at venous blood flow that carries blood from the heart to the legs or arms. Allow one hour for a Lower Venous exam. Allow thirty minutes for an Upper Venous exam. There are no restrictions or special instructions.     Follow-Up: Your physician recommends that you keep your scheduled  follow-up appointment with Dr.Crenshaw in May.    Any Other Special Instructions Will Be Listed Below (If Applicable).     If you need a refill on your cardiac medications before your next appointment, please call your pharmacy.

## 2017-09-23 ENCOUNTER — Ambulatory Visit (HOSPITAL_COMMUNITY)
Admission: RE | Admit: 2017-09-23 | Discharge: 2017-09-23 | Disposition: A | Discharge status: Home / Self Care | Payer: Medicare Other | Source: Ambulatory Visit | Attending: Cardiology | Admitting: Cardiology

## 2017-09-23 ENCOUNTER — Telehealth: Payer: Self-pay | Admitting: Cardiology

## 2017-09-23 DIAGNOSIS — I82409 Acute embolism and thrombosis of unspecified deep veins of unspecified lower extremity: Secondary | ICD-10-CM | POA: Diagnosis not present

## 2017-09-23 DIAGNOSIS — I5023 Acute on chronic systolic (congestive) heart failure: Secondary | ICD-10-CM

## 2017-09-23 NOTE — Progress Notes (Signed)
*  Preliminary Results* Left lower extremity venous duplex completed. Left lower extremity is negative for deep vein thrombosis. There is no evidence of left Baker's cyst.  Incidental finding: there is a heterogenous area of the proximal medial left calf with low-resistant arterial flow at the periphery. Suggestive of muscle tear versus mass; further testing may be warranted if clinically indicated.  Preliminary results discussed with Dr. [name].  09/23/2017 4:04 PM  Maudry Mayhew, BS, RVT, RDCS, RDMS

## 2017-09-23 NOTE — Telephone Encounter (Signed)
Contacted by radiology; no DVT noted on lower ext venous dopplers; pt instructed to DC apixaban. Kirk Ruths

## 2017-09-29 ENCOUNTER — Ambulatory Visit (INDEPENDENT_AMBULATORY_CARE_PROVIDER_SITE_OTHER): Payer: Medicare Other | Admitting: Physician Assistant

## 2017-09-29 ENCOUNTER — Encounter: Payer: Self-pay | Admitting: Physician Assistant

## 2017-09-29 VITALS — BP 123/58 | HR 61 | Ht 70.0 in | Wt 238.0 lb

## 2017-09-29 DIAGNOSIS — E089 Diabetes mellitus due to underlying condition without complications: Secondary | ICD-10-CM | POA: Diagnosis not present

## 2017-09-29 DIAGNOSIS — M7989 Other specified soft tissue disorders: Secondary | ICD-10-CM

## 2017-09-29 DIAGNOSIS — E782 Mixed hyperlipidemia: Secondary | ICD-10-CM

## 2017-09-29 DIAGNOSIS — Z131 Encounter for screening for diabetes mellitus: Secondary | ICD-10-CM

## 2017-09-29 DIAGNOSIS — N521 Erectile dysfunction due to diseases classified elsewhere: Secondary | ICD-10-CM | POA: Diagnosis not present

## 2017-09-29 DIAGNOSIS — R7309 Other abnormal glucose: Secondary | ICD-10-CM | POA: Diagnosis not present

## 2017-09-29 DIAGNOSIS — R972 Elevated prostate specific antigen [PSA]: Secondary | ICD-10-CM | POA: Diagnosis not present

## 2017-09-29 DIAGNOSIS — R600 Localized edema: Secondary | ICD-10-CM | POA: Insufficient documentation

## 2017-09-29 DIAGNOSIS — R936 Abnormal findings on diagnostic imaging of limbs: Secondary | ICD-10-CM | POA: Diagnosis not present

## 2017-09-29 MED ORDER — SILDENAFIL CITRATE 100 MG PO TABS: 100.0000 mg | ORAL_TABLET | ORAL | 5 refills | Status: DC | PRN

## 2017-09-29 NOTE — Progress Notes (Addendum)
Subjective:    Patient ID: Earl Hatcher Sr., male    DOB: 03-18-1941, 77 y.o.   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 He had recently traveled and did not take his Lasix because he did not want to stop to use the bathroom that often. He states that his left leg was 3x the size of his right. He had a negative DVT on 3/28 and d/c eliquis. He was told to follow up with PCP after a heterogenous area of the proximal medial left calf with low-resistant arterial flow at the peripehry was noted. Suggestive of a muscle tear versus mass. He denies any known injury and denies any pain at the site. He has not noticed and redness or tenderness. He also request refill on Viagra at this time.  .. Active Ambulatory Problems    Diagnosis Date Noted  . Essential hypertension, benign 04/28/2013  . Diabetes mellitus due to underlying condition, controlled, without complication, 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[place]) 04/28/2013  . BPH (benign prostatic hyperplasia) 04/28/2013  . Gout 04/28/2013  . Congestive dilated cardiomyopathy ([place]) 05/10/2013  . ICD (implantable cardioverter-defibrillator) in place 05/10/2013  . Elevated PSA 11/24/2013  . Systolic dysfunction with acute on chronic heart failure ([place]) 09/03/2014  . Morbid obesity ([place]) 09/03/2014  . Erectile dysfunction 03/10/2015  . Lower extremity edema 09/29/2017  . Abnormal ultrasound of lower extremity 09/29/2017   Resolved Ambulatory Problems    Diagnosis Date Noted  . No Resolved Ambulatory Problems   Past Medical History:  Diagnosis Date  . BPH (benign prostatic hyperplasia)   . CHF (congestive heart failure) ([place])   . Diabetes mellitus ([place])   . Gout   . Hyperlipidemia   . Hypertension   . Nonischemic cardiomyopathy ([place])   . Paroxysmal atrial fibrillation ([place]) 12/25/15      Review of Systems   Constitutional: Negative for chills and fever.  Respiratory: Negative for cough and shortness of breath.   Cardiovascular: Positive for leg swelling. Negative for chest pain and palpitations.  Musculoskeletal: Negative for myalgias.  Skin: Negative for rash and wound.       Objective:   Physical Exam  Constitutional: He is oriented to person, place, and time. He appears well-developed and well-nourished. No distress.  HENT:  Head: Normocephalic and atraumatic.  Right Ear: External ear normal.  Left Ear: External ear normal.  Eyes: Conjunctivae are normal.  Cardiovascular: Normal rate, regular rhythm, normal heart sounds and intact distal pulses.  +2 edema to the RLE; +3 edema to the LLE  Pulmonary/Chest: Effort normal and breath sounds normal. No respiratory distress. He has no wheezes.  Musculoskeletal: Normal range of motion.  No tenderness over calf to palpation.  Good pedal pulses bilaterally.   Neurological: He is alert and oriented to person, place, and time.  Skin: Skin is warm and dry.  Psychiatric: He has a normal mood and affect. His behavior is normal.          Assessment & Plan:  [name] KitchenMarland KitchenDiagnoses and all orders for this visit:  Left leg swelling  Mixed hyperlipidemia -     Lipid Panel w/reflex Direct LDL  Screening for diabetes mellitus -     COMPLETE METABOLIC PANEL WITH GFR  Elevated PSA -     PSA -     CBC with Differential/Platelet  Erectile  dysfunction due to diseases classified elsewhere -     sildenafil (VIAGRA) 100 MG tablet; Take 1 tablet (100 mg total) by mouth as needed for erectile dysfunction.  Abnormal ultrasound of lower extremity   Reassurance given that edema not due to DVT. Need to get MRI to look at muscle tear vs mass that was concerning on u/s.   Continue lasix. Weight looks good. Vitals look good.   Needs fasting labs.Need to get A!C to see if DM is being controlled.  On ACE and statin.  BP controlled.  Pt declined pneumonia  vaccines.    Discussed SE's of viagra.refilled. Follow up as needed.

## 2017-10-01 ENCOUNTER — Telehealth: Payer: Self-pay | Admitting: Physician Assistant

## 2017-10-01 MED ORDER — METFORMIN HCL ER 750 MG PO TB24: ORAL_TABLET | ORAL | 0 refills | Status: DC

## 2017-10-01 NOTE — Telephone Encounter (Signed)
Can we call imaging and find out what is the best MRI to order to rule out muscle tear vs mass in left calf.

## 2017-10-01 NOTE — Progress Notes (Addendum)
No ICM remote transmission received for 09/21/2017 and next ICM transmission scheduled for 10/18/2017.

## 2017-10-01 NOTE — Progress Notes (Signed)
Need A!C added.  LDL not to goal which is under 70 for diabetic. Are you ok with switching up statin to something a little stronger?  PSA elevated more. When next urology appt?  Potassium just a hair elevated.

## 2017-10-02 LAB — COMPLETE METABOLIC PANEL WITH GFR
AG RATIO: 1.5 (calc) (ref 1.0–2.5)
ALKALINE PHOSPHATASE (APISO): 69 U/L (ref 40–115)
ALT: 14 U/L (ref 9–46)
AST: 16 U/L (ref 10–35)
Albumin: 4.1 g/dL (ref 3.6–5.1)
BUN: 20 mg/dL (ref 7–25)
CHLORIDE: 105 mmol/L (ref 98–110)
CO2: 28 mmol/L (ref 20–32)
Calcium: 9.7 mg/dL (ref 8.6–10.3)
Creat: 1.02 mg/dL (ref 0.70–1.18)
GFR, EST AFRICAN AMERICAN: 82 mL/min/{1.73_m2} (ref >=60)
GFR, Est Non African American: 71 mL/min/{1.73_m2} (ref >=60)
GLUCOSE: 132 mg/dL — AB (ref 65–99)
Globulin: 2.8 g/dL (calc) (ref 1.9–3.7)
POTASSIUM: 5.4 mmol/L — AB (ref 3.5–5.3)
Sodium: 140 mmol/L (ref 135–146)
Total Bilirubin: 0.7 mg/dL (ref 0.2–1.2)
Total Protein: 6.9 g/dL (ref 6.1–8.1)

## 2017-10-02 LAB — CBC WITH DIFFERENTIAL/PLATELET
BASOS ABS: 49 {cells}/uL (ref 0–200)
Basophils Relative: 0.8 %
EOS ABS: 122 {cells}/uL (ref 15–500)
Eosinophils Relative: 2 %
HCT: 47.7 % (ref 38.5–50.0)
Hemoglobin: 16 g/dL (ref 13.2–17.1)
Lymphs Abs: 1562 cells/uL (ref 850–3900)
MCH: 29.4 pg (ref 27.0–33.0)
MCHC: 33.5 g/dL (ref 32.0–36.0)
MCV: 87.7 fL (ref 80.0–100.0)
MONOS PCT: 9.7 %
MPV: 10.8 fL (ref 7.5–12.5)
NEUTROS PCT: 61.9 %
Neutro Abs: 3776 cells/uL (ref 1500–7800)
PLATELETS: 309 10*3/uL (ref 140–400)
RBC: 5.44 10*6/uL (ref 4.20–5.80)
RDW: 15 % (ref 11.0–15.0)
TOTAL LYMPHOCYTE: 25.6 %
WBC: 6.1 10*3/uL (ref 3.8–10.8)
WBCMIX: 592 {cells}/uL (ref 200–950)

## 2017-10-02 LAB — LIPID PANEL W/REFLEX DIRECT LDL
CHOL/HDL RATIO: 4.5 (calc) (ref <5.0)
CHOLESTEROL: 172 mg/dL (ref <200)
HDL: 38 mg/dL — AB (ref >40)
LDL CHOLESTEROL (CALC): 110 mg/dL — AB
NON-HDL CHOLESTEROL (CALC): 134 mg/dL — AB (ref <130)
TRIGLYCERIDES: 129 mg/dL (ref <150)

## 2017-10-02 LAB — HEMOGLOBIN A1C W/OUT EAG: HEMOGLOBIN A1C: 7.3 %{Hb} — AB (ref <5.7)

## 2017-10-02 LAB — PSA: PSA: 9.1 ng/mL — ABNORMAL HIGH (ref <=4.0)

## 2017-10-03 NOTE — Progress Notes (Signed)
Call pt: a1c 7.3 and has started to climb a little bit. Need to have some tighter sugar and carb control. If above 7 at next follow up then will need to add medications for DM control? Can we please find out answers to previous questions?

## 2017-10-04 NOTE — Telephone Encounter (Signed)
Per radiology, this should be an MRI Tib/Fib W WO contrast.

## 2017-10-06 MED ORDER — ATORVASTATIN CALCIUM 80 MG PO TABS: 80.0000 mg | ORAL_TABLET | Freq: Every day | ORAL | 3 refills | Status: DC

## 2017-10-06 NOTE — Telephone Encounter (Signed)
Pt advised and transferred to scheduler for appointment.

## 2017-10-06 NOTE — Addendum Note (Signed)
Addended by: Donella Stade on: 10/06/2017 09:49 AM   Modules accepted: Orders

## 2017-10-06 NOTE — Telephone Encounter (Signed)
Just let patient know MrI is complicated since he has ICD defibrillator. I consulted sports medicine Dr. Darene Lamer here in office and he would like to evaluate need for further imaging. Suspect may could be coming from knee his swelling. Can we schedule him in office with Dr. Darene Lamer?

## 2017-10-06 NOTE — Telephone Encounter (Signed)
I have left message for radiology regarding question, awaiting callback.

## 2017-10-06 NOTE — Telephone Encounter (Signed)
Spoke with radiology, Pt would have to have his implant card to see if it is MRI safe. Even so, [facility] typically does not scan Pt's with cardiac defibrillators due to the risk factors associated. If they could do it, it would have to be at [facility] with a cardiologist present. Typically these are referred out of network to more specialized hospitals [facility]) or a different test is ordered. Will route.

## 2017-10-06 NOTE — Telephone Encounter (Signed)
Earl White,   He has an ICD is there an MRI he can do with ICD placement?

## 2017-10-11 ENCOUNTER — Encounter: Payer: Medicare Other | Admitting: Sports Medicine

## 2017-10-11 DIAGNOSIS — Z0189 Encounter for other specified special examinations: Secondary | ICD-10-CM

## 2017-10-18 ENCOUNTER — Telehealth: Payer: Self-pay | Admitting: Cardiology

## 2017-10-18 NOTE — Telephone Encounter (Signed)
Spoke with pt and reminded pt of remote transmission that is due today. Pt verbalized understanding.   

## 2017-10-19 ENCOUNTER — Other Ambulatory Visit: Payer: Self-pay | Admitting: Physician Assistant

## 2017-10-19 DIAGNOSIS — E089 Diabetes mellitus due to underlying condition without complications: Secondary | ICD-10-CM

## 2017-10-20 ENCOUNTER — Other Ambulatory Visit: Payer: Self-pay | Admitting: Cardiology

## 2017-10-20 DIAGNOSIS — I429 Cardiomyopathy, unspecified: Secondary | ICD-10-CM

## 2017-10-20 DIAGNOSIS — I10 Essential (primary) hypertension: Secondary | ICD-10-CM

## 2017-10-20 DIAGNOSIS — I5022 Chronic systolic (congestive) heart failure: Secondary | ICD-10-CM

## 2017-10-20 NOTE — Telephone Encounter (Signed)
REFILL 

## 2017-10-29 NOTE — Progress Notes (Signed)
No ICM remote transmission received for 10/18/2017 and next ICM transmission scheduled for 11/11/2017.

## 2017-11-11 ENCOUNTER — Other Ambulatory Visit: Payer: Self-pay | Admitting: Cardiology

## 2017-11-11 ENCOUNTER — Telehealth: Payer: Self-pay

## 2017-11-11 ENCOUNTER — Ambulatory Visit (INDEPENDENT_AMBULATORY_CARE_PROVIDER_SITE_OTHER): Payer: Medicare Other

## 2017-11-11 DIAGNOSIS — Z95810 Presence of automatic (implantable) cardiac defibrillator: Secondary | ICD-10-CM

## 2017-11-11 DIAGNOSIS — I5022 Chronic systolic (congestive) heart failure: Secondary | ICD-10-CM | POA: Diagnosis not present

## 2017-11-11 DIAGNOSIS — I420 Dilated cardiomyopathy: Secondary | ICD-10-CM

## 2017-11-11 NOTE — Telephone Encounter (Signed)
Spoke with pt and reminded pt of remote transmission that is due today. Pt verbalized understanding.   

## 2017-11-12 NOTE — Progress Notes (Signed)
EPIC Encounter for ICM Monitoring  Patient Name: Earl Greenhalgh Sr. is a 77 y.o. male Date: 11/12/2017 Primary Care Physican: Lavada Mesi Primary Cardiologist:Crenshaw Electrophysiologist: Allred Dry Weight:233 lbs Bi-V Pacing: 98%       Heart Failure questions reviewed, pt asymptomatic.  Patient traveling during the time of decreased impedance and unable to take Furosemide daily.   Thoracic impedance normal but was abnormal suggesting fluid accumulation from 10/23/2017 - 11/02/2017.  Prescribed dosage: Furosemide 40 mg 1.5 tablets (60 mg total) daily.   Labs: 07/03/2016 Creatinine 1.32, BUN 22, Potassium 4.9, Sodium 141 06/03/2016 Creatinine 1.53, BUN 43, Potassium 5.1, Sodium 139  04/20/2016 Creatinine 1.36, BUN 32, Potassium 5.0, Sodium 141  12/25/2015 Creatinine 1.22, BUN 31, Potassium 4.9, Sodium 139  08/07/2015 Creatinine 1.11, BUN 21, Potassium 4.5, Sodium 141  07/10/2015 Creatinine 1.14, BUN 19, Potassium 4.3, Sodium 141  Recommendations: No changes.   Encouraged to call for fluid symptoms.  Follow-up plan: ICM clinic phone appointment on 12/16/2017.  Office appointment scheduled 11/24/2017 with Dr. [name].  Copy of ICM check sent to Dr. Rayann Heman.   3 month ICM trend: 11/11/2017    1 Year ICM trend:       Rosalene Billings, RN 11/12/2017 3:31 PM

## 2017-11-18 DIAGNOSIS — N401 Enlarged prostate with lower urinary tract symptoms: Secondary | ICD-10-CM | POA: Diagnosis not present

## 2017-11-18 DIAGNOSIS — R972 Elevated prostate specific antigen [PSA]: Secondary | ICD-10-CM | POA: Diagnosis not present

## 2017-11-18 DIAGNOSIS — R350 Frequency of micturition: Secondary | ICD-10-CM | POA: Diagnosis not present

## 2017-11-19 NOTE — Progress Notes (Signed)
HPI: FU congestive heart failure. Patient previously resided in [place] and then [place]. He apparently was diagnosed with a cardiomyopathy. He does not know the etiology and denies catheterization. He has had previous CRT-D. He moved here in March of 2014. Last echocardiogram April 2016 showed ejection fraction 25-30%. There was grade 1 diastolic dysfunction. Left atrium is mildly dilated. Patient agreed to nuclear study April 2017. This showed ejection fraction 24% There was a large inferior lateral scar with minimal peri-infarct ischemia.Patient did not want to pursue cardiac catheterization. Venous Dopplers March 2019 showed no DVT.  There was incidental note of heterogeneous area in the left calf suggestive of muscle tear versus mass.  Patient asked to follow-up with primary care for this issue.  Since last seen,the patient has dyspnea with more extreme activities but not with routine activities. It is relieved with rest. It is not associated with chest pain. There is no orthopnea, PND or pedal edema. There is no syncope or palpitations. There is no exertional chest pain.   Current Outpatient Medications  Medication Sig Dispense Refill  . aspirin 81 MG tablet Take 81 mg by mouth daily.    [name] atorvastatin (LIPITOR) 80 MG tablet Take 1 tablet (80 mg total) by mouth daily. 90 tablet 3  . carvedilol (COREG) 25 MG tablet Take 1 tablet (25 mg total) by mouth 2 (two) times daily with a meal. 180 tablet 1  . ENTRESTO 97-103 MG TAKE 1 TABLET BY MOUTH TWICE DAILY 180 tablet 3  . furosemide (LASIX) 40 MG tablet TAKE 2 TABLETS BY MOUTH IN THE MORNING AND 1 TABLET BY MOUTH AT 3 PM 270 tablet 3  . metFORMIN (GLUCOPHAGE-XR) 750 MG 24 hr tablet Take once daily. 90 tablet 0  . sennosides-docusate sodium (SENOKOT-S) 8.6-50 MG tablet Take 1 tablet by mouth daily as needed. For constipation    . sildenafil (VIAGRA) 100 MG tablet Take 1 tablet (100 mg total) by mouth as needed for erectile  dysfunction. 10 tablet 5  . spironolactone (ALDACTONE) 25 MG tablet Take 0.5 tablets (12.5 mg total) by mouth daily. 30 tablet 9   No current facility-administered medications for this visit.      Past Medical History:  Diagnosis Date  . BPH (benign prostatic hyperplasia)   . CHF (congestive heart failure) ([place])   . 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 Gout   . Hyperlipidemia   . Hypertension   . Nonischemic cardiomyopathy ([place])    a. s/p STJ CRTD  . Paroxysmal atrial fibrillation ([place]) 12/25/15    Past Surgical History:  Procedure Laterality Date  . CARDIAC DEFIBRILLATOR PLACEMENT  Jan 2014   SJM Quadra Assura implanted by Dr [name] in [place] History   Socioeconomic History  . Marital status: Married    Spouse name: Not on file  . Number of children: 1  . Years of education: Not on file  . Highest education level: Not on file  Occupational History  . Not on file  Social Needs  . Financial resource strain: Not on file  . Food insecurity:    Worry: Not on file    Inability: Not on file  . Transportation needs:    Medical: Not on file    Non-medical: Not on file  Tobacco Use  . Smoking status: Never Smoker  . Smokeless tobacco: Never Used  Substance and Sexual Activity  . Alcohol use: Yes  Comment: Occasional  . Drug use: No  . Sexual activity: Yes  Lifestyle  . Physical activity:    Days per week: Not on file    Minutes per session: Not on file  . Stress: Not on file  Relationships  . Social connections:    Talks on phone: Not on file    Gets together: Not on file    Attends religious service: Not on file    Active member of club or organization: Not on file    Attends meetings of clubs or organizations: Not on file    Relationship status: Not on file  . Intimate partner violence:    Fear of current or ex partner: Not on file    Emotionally abused: Not on file    Physically abused: Not on file    Forced  sexual activity: Not on file  Other Topics Concern  . Not on file  Social History Narrative   Previously lived in [place] before moving to [place].  He has recently relocated to [place].    Family History  Problem Relation Age of Onset  . Diabetes Mother   . Hyperlipidemia Mother   . Diabetes Brother     ROS: no fevers or chills, productive cough, hemoptysis, dysphasia, odynophagia, melena, hematochezia, dysuria, hematuria, rash, seizure activity, orthopnea, PND, pedal edema, claudication. Remaining systems are negative.  Physical Exam: Well-developed well-nourished in no acute distress.  Skin is warm and dry.  HEENT is normal.  Neck is supple.  Chest is clear to auscultation with normal expansion.  Cardiovascular exam is regular rate and rhythm.  Abdominal exam nontender or distended. No masses palpated. Extremities show no edema. neuro grossly intact   A/P  1 coronary artery disease-presumed secondary to results of previous nuclear study.  Continue aspirin and statin.  No chest pain.  Does not want to pursue catheterization.  2 ischemic cardiomyopathy-continue Entresto and carvedilol.    3 chronic systolic congestive heart failure-doing well from a volume standpoint.  Continue present dose of diuretic.  Patient instructed on importance of low-sodium diet and fluid restriction.  4 hypertension-blood pressure is controlled.  Continue present medications.  5 prior ICD-followed by electrophysiology.  Kirk Ruths, MD

## 2017-11-24 ENCOUNTER — Encounter: Payer: Self-pay | Admitting: Cardiology

## 2017-11-24 ENCOUNTER — Ambulatory Visit (INDEPENDENT_AMBULATORY_CARE_PROVIDER_SITE_OTHER): Payer: Medicare Other | Admitting: Cardiology

## 2017-11-24 VITALS — BP 133/72 | HR 59 | Ht 70.0 in | Wt 232.0 lb

## 2017-11-24 DIAGNOSIS — Z95810 Presence of automatic (implantable) cardiac defibrillator: Secondary | ICD-10-CM

## 2017-11-24 DIAGNOSIS — I2510 Atherosclerotic heart disease of native coronary artery without angina pectoris: Secondary | ICD-10-CM | POA: Diagnosis not present

## 2017-11-24 DIAGNOSIS — I255 Ischemic cardiomyopathy: Secondary | ICD-10-CM

## 2017-11-24 DIAGNOSIS — I10 Essential (primary) hypertension: Secondary | ICD-10-CM | POA: Diagnosis not present

## 2017-11-24 MED ORDER — SPIRONOLACTONE 25 MG PO TABS: 12.5000 mg | ORAL_TABLET | Freq: Every day | ORAL | 6 refills | Status: DC

## 2017-11-24 NOTE — Patient Instructions (Signed)
Your physician wants you to follow-up in: 6 MONTHS WITH DR CRENSHAW You will receive a reminder letter in the mail two months in advance. If you don't receive a letter, please call our office to schedule the follow-up appointment.   If you need a refill on your cardiac medications before your next appointment, please call your pharmacy.  

## 2017-12-16 ENCOUNTER — Telehealth: Payer: Self-pay | Admitting: Cardiology

## 2017-12-16 ENCOUNTER — Ambulatory Visit (INDEPENDENT_AMBULATORY_CARE_PROVIDER_SITE_OTHER): Payer: Medicare Other | Admitting: *Deleted

## 2017-12-16 DIAGNOSIS — I255 Ischemic cardiomyopathy: Secondary | ICD-10-CM

## 2017-12-16 DIAGNOSIS — Z95810 Presence of automatic (implantable) cardiac defibrillator: Secondary | ICD-10-CM

## 2017-12-16 DIAGNOSIS — I5022 Chronic systolic (congestive) heart failure: Secondary | ICD-10-CM

## 2017-12-16 NOTE — Telephone Encounter (Signed)
Spoke with pt and reminded pt of remote transmission that is due today. Pt verbalized understanding.   

## 2017-12-17 NOTE — Progress Notes (Signed)
Remote ICD transmission.   

## 2017-12-17 NOTE — Progress Notes (Signed)
EPIC Encounter for ICM Monitoring  Patient Name: Earl Brownlow Sr. is a 77 y.o. male Date: 12/17/2017 Primary Care Physican: Lavada Mesi Primary Cardiologist:Crenshaw Electrophysiologist: Allred Dry Weight:230 lbs Bi-V Pacing: 98%       Heart Failure questions reviewed, pt asymptomatic.  Advised to limit salt and fluids.    Thoracic impedance normal but note a slight downward trend on transmission day.  Prescribed dosage: Furosemide 40 mg 1.5 tablets (60 mg total) daily.   Labs: 07/03/2016 Creatinine 1.32, BUN 22, Potassium 4.9, Sodium 141 06/03/2016 Creatinine 1.53, BUN 43, Potassium 5.1, Sodium 139  04/20/2016 Creatinine 1.36, BUN 32, Potassium 5.0, Sodium 141  12/25/2015 Creatinine 1.22, BUN 31, Potassium 4.9, Sodium 139  08/07/2015 Creatinine 1.11, BUN 21, Potassium 4.5, Sodium 141  07/10/2015 Creatinine 1.14, BUN 19, Potassium 4.3, Sodium 141  Recommendations: No changes.  Encouraged to call for fluid symptoms.  Follow-up plan: ICM clinic phone appointment on 02/14/2018.  Office appointment scheduled 01/12/2018 with Chanetta Marshall, NP.   Copy of ICM check sent to Dr. Rayann Heman.   3 month ICM trend: 12/17/2017    1 Year ICM trend:       Rosalene Billings, RN 12/17/2017 3:02 PM

## 2017-12-21 DIAGNOSIS — D075 Carcinoma in situ of prostate: Secondary | ICD-10-CM | POA: Diagnosis not present

## 2017-12-21 DIAGNOSIS — N401 Enlarged prostate with lower urinary tract symptoms: Secondary | ICD-10-CM | POA: Diagnosis not present

## 2017-12-21 DIAGNOSIS — R972 Elevated prostate specific antigen [PSA]: Secondary | ICD-10-CM | POA: Diagnosis not present

## 2017-12-21 DIAGNOSIS — C61 Malignant neoplasm of prostate: Secondary | ICD-10-CM | POA: Diagnosis not present

## 2017-12-27 ENCOUNTER — Other Ambulatory Visit: Payer: Self-pay | Admitting: Cardiology

## 2017-12-27 NOTE — Telephone Encounter (Signed)
Please review for refill, Thanks !  

## 2018-01-03 DIAGNOSIS — C61 Malignant neoplasm of prostate: Secondary | ICD-10-CM | POA: Diagnosis not present

## 2018-01-04 ENCOUNTER — Encounter: Payer: Self-pay | Admitting: Radiation Oncology

## 2018-01-04 LAB — CUP PACEART REMOTE DEVICE CHECK
Battery Remaining Percentage: 33 %
Brady Statistic AP VP Percent: 87 %
Brady Statistic AP VS Percent: 1.2 %
Brady Statistic AS VP Percent: 11 %
Brady Statistic AS VS Percent: 1 %
Date Time Interrogation Session: 20190621044725
HIGH POWER IMPEDANCE MEASURED VALUE: 42 Ohm
HighPow Impedance: 42 Ohm
Implantable Lead Location: 753858
Lead Channel Impedance Value: 430 Ohm
Lead Channel Impedance Value: 440 Ohm
Lead Channel Pacing Threshold Amplitude: 1.25 V
Lead Channel Pacing Threshold Pulse Width: 0.5 ms
Lead Channel Pacing Threshold Pulse Width: 0.5 ms
Lead Channel Sensing Intrinsic Amplitude: 5 mV
Lead Channel Setting Pacing Amplitude: 2 V
Lead Channel Setting Pacing Amplitude: 2.25 V
Lead Channel Setting Pacing Pulse Width: 0.5 ms
Lead Channel Setting Pacing Pulse Width: 0.5 ms
MDC IDC LEAD IMPLANT DT: 20140117
MDC IDC LEAD IMPLANT DT: 20140117
MDC IDC LEAD IMPLANT DT: 20140117
MDC IDC LEAD LOCATION: 753859
MDC IDC LEAD LOCATION: 753860
MDC IDC MSMT BATTERY REMAINING LONGEVITY: 30 mo
MDC IDC MSMT BATTERY VOLTAGE: 2.89 V
MDC IDC MSMT LEADCHNL LV IMPEDANCE VALUE: 850 Ohm
MDC IDC MSMT LEADCHNL LV PACING THRESHOLD AMPLITUDE: 0.75 V
MDC IDC MSMT LEADCHNL RA PACING THRESHOLD PULSEWIDTH: 0.5 ms
MDC IDC MSMT LEADCHNL RV PACING THRESHOLD AMPLITUDE: 1 V
MDC IDC MSMT LEADCHNL RV SENSING INTR AMPL: 11.6 mV
MDC IDC PG IMPLANT DT: 20140117
MDC IDC SET LEADCHNL LV PACING AMPLITUDE: 2 V
MDC IDC SET LEADCHNL RV SENSING SENSITIVITY: 0.5 mV
MDC IDC STAT BRADY RA PERCENT PACED: 88 %
Pulse Gen Serial Number: 7070427

## 2018-01-05 ENCOUNTER — Ambulatory Visit: Payer: Medicare Other | Admitting: Physician Assistant

## 2018-01-06 NOTE — Progress Notes (Signed)
Electrophysiology Office Note Date: 01/12/2018  ID:  Earl Hatcher Sr., DOB 28-Dec-1940, MRN [id number]  PCP: Lavada Mesi Primary Cardiologist: [name] Electrophysiologist: Allred  CC: Routine ICD follow-up  Earl Hatcher Sr. is a 77 y.o. male seen today for Dr Rayann Heman.  He presents today for routine electrophysiology followup.  Since last being seen in our clinic, the patient reports doing very well. He denies chest pain, palpitations, dyspnea, PND, orthopnea, nausea, vomiting, dizziness, syncope, edema, weight gain, or early satiety.  He has not had ICD shocks.   Device History: STJ CRTD implanted 2014 for NICM and CHF History of appropriate therapy: No History of AAD therapy: No   Past Medical History:  Diagnosis Date  . BPH (benign prostatic hyperplasia)   . CHF (congestive heart failure) ([place])   . 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 Gout   . Hyperlipidemia   . Hypertension   . Nonischemic cardiomyopathy ([place])    a. s/p STJ CRTD  . Paroxysmal atrial fibrillation ([place]) 12/25/15   Past Surgical History:  Procedure Laterality Date  . CARDIAC DEFIBRILLATOR PLACEMENT  Jan 2014   SJM Quadra Assura implanted by Dr [name] in [place]    Current Outpatient Medications  Medication Sig Dispense Refill  . atorvastatin (LIPITOR) 80 MG tablet Take 1 tablet (80 mg total) by mouth daily. 90 tablet 3  . carvedilol (COREG) 25 MG tablet Take 1 tablet (25 mg total) by mouth 2 (two) times daily with a meal. 180 tablet 1  . ENTRESTO 97-103 MG TAKE 1 TABLET BY MOUTH TWICE DAILY 180 tablet 3  . furosemide (LASIX) 40 MG tablet Take 40 mg by mouth daily.    . metFORMIN (GLUCOPHAGE-XR) 750 MG 24 hr tablet Take once daily. 90 tablet 0  . sennosides-docusate sodium (SENOKOT-S) 8.6-50 MG tablet Take 1 tablet by mouth daily as needed. For constipation    . sildenafil (VIAGRA) 100 MG tablet Take 1 tablet (100 mg total) by mouth as needed for erectile  dysfunction. 10 tablet 5  . spironolactone (ALDACTONE) 25 MG tablet Take 0.5 tablets (12.5 mg total) by mouth daily. 30 tablet 6  . aspirin 81 MG tablet Take 81 mg by mouth daily.    . furosemide (LASIX) 40 MG tablet TAKE 2 TABLETS BY MOUTH IN THE MORNING AND 1 TABLET BY MOUTH AT 3 PM (Patient not taking: Reported on 01/12/2018) 270 tablet 3   No current facility-administered medications for this visit.     Allergies:   Patient has no known allergies.   Social History: Social History   Socioeconomic History  . Marital status: Married    Spouse name: Not on file  . Number of children: 1  . Years of education: Not on file  . Highest education level: Not on file  Occupational History  . Not on file  Social Needs  . Financial resource strain: Not on file  . Food insecurity:    Worry: Not on file    Inability: Not on file  . Transportation needs:    Medical: Not on file    Non-medical: Not on file  Tobacco Use  . Smoking status: Never Smoker  . Smokeless tobacco: Never Used  Substance and Sexual Activity  . Alcohol use: Yes    Comment: Occasional  . Drug use: No  . Sexual activity: Yes  Lifestyle  . Physical activity:    Days per week: Not on file  Minutes per session: Not on file  . Stress: Not on file  Relationships  . Social connections:    Talks on phone: Not on file    Gets together: Not on file    Attends religious service: Not on file    Active member of club or organization: Not on file    Attends meetings of clubs or organizations: Not on file    Relationship status: Not on file  . Intimate partner violence:    Fear of current or ex partner: Not on file    Emotionally abused: Not on file    Physically abused: Not on file    Forced sexual activity: Not on file  Other Topics Concern  . Not on file  Social History Narrative   Previously lived in [place] before moving to [place].  He has recently relocated to [place].    Family History: Family  History  Problem Relation Age of Onset  . Diabetes Mother   . Hyperlipidemia Mother   . Diabetes Brother     Review of Systems: All other systems reviewed and are otherwise negative except as noted above.   Physical Exam: VS:  BP 120/78   Pulse 60   Ht 5\' 10"  (1.778 m)   Wt 235 lb (106.6 kg)   SpO2 90%   BMI 33.72 kg/m  , BMI Body mass index is 33.72 kg/m.  GEN- The patient is well appearing, alert and oriented x 3 today.   HEENT: normocephalic, atraumatic; sclera clear, conjunctiva pink; hearing intact; oropharynx clear; neck supple, no JVP Lymph- no cervical lymphadenopathy Lungs- Clear to ausculation bilaterally, normal work of breathing.  No wheezes, rales, rhonchi Heart- Regular rate and rhythm, no murmurs, rubs or gallops, PMI not laterally displaced GI- soft, non-tender, non-distended, bowel sounds present, no hepatosplenomegaly Extremities- no clubbing, cyanosis, or edema; DP/PT/radial pulses 2+ bilaterally MS- no significant deformity or atrophy Skin- warm and dry, no rash or lesion; ICD pocket well healed Psych- euthymic mood, full affect Neuro- strength and sensation are intact  ICD interrogation- reviewed in detail today,  See PACEART report  EKG:  EKG is not ordered today.  Recent Labs: 09/29/2017: ALT 14; BUN 20; Creat 1.02; Hemoglobin 16.0; Platelets 309; Potassium 5.4; Sodium 140   Wt Readings from Last 3 Encounters:  01/12/18 235 lb (106.6 kg)  11/24/17 232 lb (105.2 kg)  09/29/17 238 lb (108 kg)     Other studies Reviewed: Additional studies/ records that were reviewed today include: Dr Jacalyn Lefevre office notes   Assessment and Plan:  1.  Chronic systolic dysfunction euvolemic today Stable on an appropriate medical regimen Normal ICD function See Pace Art report No changes today  2.  CAD No recent ischemic symptoms Continue medical therapy  3.  HTN Stable No change required today    Current medicines are reviewed at length with the  patient today.   The patient does not have concerns regarding his medicines.  The following changes were made today:  none  Labs/ tests ordered today include: none No orders of the defined types were placed in this encounter.    Disposition:   Follow up with Delilah Shan, ICM clinic, Dr [name] as scheduled, Dr Rayann Heman 1 year      Signed, Chanetta Marshall, NP 01/12/2018 12:19 PM  [facility] [address] [place] [address] [phone] (office) [phone] (fax)

## 2018-01-12 ENCOUNTER — Ambulatory Visit (INDEPENDENT_AMBULATORY_CARE_PROVIDER_SITE_OTHER): Payer: Medicare Other | Admitting: Nurse Practitioner

## 2018-01-12 ENCOUNTER — Encounter: Payer: Self-pay | Admitting: Nurse Practitioner

## 2018-01-12 VITALS — BP 120/78 | HR 60 | Ht 70.0 in | Wt 235.0 lb

## 2018-01-12 DIAGNOSIS — I255 Ischemic cardiomyopathy: Secondary | ICD-10-CM | POA: Diagnosis not present

## 2018-01-12 DIAGNOSIS — I2510 Atherosclerotic heart disease of native coronary artery without angina pectoris: Secondary | ICD-10-CM

## 2018-01-12 DIAGNOSIS — I10 Essential (primary) hypertension: Secondary | ICD-10-CM

## 2018-01-12 DIAGNOSIS — I5022 Chronic systolic (congestive) heart failure: Secondary | ICD-10-CM | POA: Diagnosis not present

## 2018-01-12 LAB — CUP PACEART INCLINIC DEVICE CHECK
Implantable Lead Implant Date: 20140117
Implantable Lead Implant Date: 20140117
Implantable Lead Location: 753858
Implantable Lead Location: 753859
Implantable Pulse Generator Implant Date: 20140117
MDC IDC LEAD IMPLANT DT: 20140117
MDC IDC LEAD LOCATION: 753860
MDC IDC PG SERIAL: 7070427
MDC IDC SESS DTM: 20190717122548

## 2018-01-12 NOTE — Patient Instructions (Addendum)
Medication Instructions:   Your physician recommends that you continue on your current medications as directed. Please refer to the Current Medication list given to you today.  If you need a refill on your cardiac medications before your next appointment, please call your pharmacy.  Labwork: NONE ORDERED  TODAY    Testing/Procedures: NONE ORDERED  TODAY    Follow-Up: Your physician wants you to follow-up in: [place] will receive a reminder letter in the mail two months in advance. If you don't receive a letter, please call our office to schedule the follow-up appointment.      Remote monitoring is used to monitor your Pacemaker of ICD from home. This monitoring reduces the number of office visits required to check your device to one time per year. It allows [place] to keep an eye on the functioning of your device to ensure it is working properly. You are scheduled for a device check from home on .  03-17-18 You may send your transmission at any time that day. If you have a wireless device, the transmission will be sent automatically. After your physician reviews your transmission, you will receive a postcard with your next transmission date.   Any Other Special Instructions Will Be Listed Below (If Applicable).

## 2018-01-13 ENCOUNTER — Encounter: Payer: Medicare Other | Admitting: Nurse Practitioner

## 2018-01-17 ENCOUNTER — Ambulatory Visit: Payer: Medicare Other | Admitting: Radiation Oncology

## 2018-01-17 ENCOUNTER — Ambulatory Visit: Payer: Medicare Other

## 2018-01-31 ENCOUNTER — Telehealth: Payer: Self-pay | Admitting: Radiation Oncology

## 2018-01-31 ENCOUNTER — Encounter: Payer: Self-pay | Admitting: Radiation Oncology

## 2018-01-31 ENCOUNTER — Encounter: Payer: Self-pay | Admitting: Medical Oncology

## 2018-01-31 ENCOUNTER — Ambulatory Visit
Admission: RE | Admit: 2018-01-31 | Discharge: 2018-01-31 | Disposition: A | Discharge status: Home / Self Care | Payer: Medicare Other | Source: Ambulatory Visit | Attending: Radiation Oncology | Admitting: Radiation Oncology

## 2018-01-31 ENCOUNTER — Other Ambulatory Visit: Payer: Self-pay

## 2018-01-31 VITALS — BP 145/75 | HR 59 | Temp 98.2°F | Resp 18 | Ht 70.0 in | Wt 235.0 lb

## 2018-01-31 DIAGNOSIS — E119 Type 2 diabetes mellitus without complications: Secondary | ICD-10-CM | POA: Insufficient documentation

## 2018-01-31 DIAGNOSIS — C61 Malignant neoplasm of prostate: Secondary | ICD-10-CM | POA: Diagnosis not present

## 2018-01-31 DIAGNOSIS — I110 Hypertensive heart disease with heart failure: Secondary | ICD-10-CM | POA: Insufficient documentation

## 2018-01-31 DIAGNOSIS — Z809 Family history of malignant neoplasm, unspecified: Secondary | ICD-10-CM

## 2018-01-31 DIAGNOSIS — Z7984 Long term (current) use of oral hypoglycemic drugs: Secondary | ICD-10-CM | POA: Insufficient documentation

## 2018-01-31 DIAGNOSIS — R972 Elevated prostate specific antigen [PSA]: Secondary | ICD-10-CM | POA: Diagnosis not present

## 2018-01-31 DIAGNOSIS — Z95810 Presence of automatic (implantable) cardiac defibrillator: Secondary | ICD-10-CM | POA: Diagnosis not present

## 2018-01-31 DIAGNOSIS — Z79899 Other long term (current) drug therapy: Secondary | ICD-10-CM | POA: Diagnosis not present

## 2018-01-31 DIAGNOSIS — Z7982 Long term (current) use of aspirin: Secondary | ICD-10-CM | POA: Insufficient documentation

## 2018-01-31 DIAGNOSIS — Z8042 Family history of malignant neoplasm of prostate: Secondary | ICD-10-CM | POA: Diagnosis not present

## 2018-01-31 DIAGNOSIS — I509 Heart failure, unspecified: Secondary | ICD-10-CM | POA: Diagnosis not present

## 2018-01-31 HISTORY — DX: Malignant neoplasm of prostate: C61

## 2018-01-31 NOTE — Progress Notes (Addendum)
GU Location of Tumor / Histology: prostatic adenocarcinoma  If Prostate Cancer, Gleason Score is (4 + 3) and PSA is (9.1). Prostate volume: 68 cc.   [name] was referred by [name], PA to Dr. Karsten Ro one year ago for further evaluation of an elevated PSA.   11/18/2017  PSA  9.24 09/29/2017  PSA  9.1 07/03/2016  PSA  6.3 08/28/2014  PSA  5.32 08/27/2013  PSA  4.14  Biopsies of prostate (if applicable) revealed:    Past/Anticipated interventions by urology, if any: prostate biopsy, referral to radiation oncology for consideration of seed implant  Past/Anticipated interventions by medical oncology, if any: no  Weight changes, if any: no  Bowel/Bladder complaints, if any: IPSS 3. SHIM 17.Reports urinary urgency associated with Furosemide. Denies dysuria, hematuria, urinary leakage or urinary incontinence.    Nausea/Vomiting, if any: no  Pain issues, if any:  Reports left leg swelling and pain following long drives to [place] or [place].  SAFETY ISSUES:  Prior radiation? no  Pacemaker/ICD? Yes, cardiac defib. Checked remotely monthly. Pacemaker form faxed.   Possible current pregnancy? no  Is the patient on methotrexate? no  Current Complaints / other details:  77 year old male. Widowed. NKDA. Moved here from [place].

## 2018-01-31 NOTE — Telephone Encounter (Signed)
Patient has not presented for 1430 appointment. Phoned mobile and home phones to inquire. No answer at either. Unable to leave voicemail message on either.

## 2018-01-31 NOTE — Progress Notes (Signed)
See progress note under physician encounter. 

## 2018-01-31 NOTE — Progress Notes (Signed)
Radiation Oncology         [phone]) [phone] ________________________________  Initial Outpatient Consultation  Name: Earl Yett Sr. MRN: [id number]  Date: 01/31/2018  DOB: Oct 31, 1940  IR:[id number], Royetta Car, PA-C  Kathie Rhodes, MD   REFERRING PHYSICIAN: Kathie Rhodes, MD  DIAGNOSIS: 77 y.o. gentleman with unfavorable intermediate risk, Stage T1c adenocarcinoma of the prostate with Gleason Score of 4+3, and PSA of 9.24    ICD-10-CM   1. Malignant neoplasm of prostate ([place]) C61   2. Family history of cancer Z80.9     HISTORY OF PRESENT ILLNESS: Earl Fantroy Sr. is a 77 y.o. male with a new diagnosis of prostate cancer.  He has a history of elevated PSA that began in 2014 with a PSA of 4.14.  It has steadily increased since then, and he had been referred to urology twice before in the past but failed to show for his appointments.  His PSA was noted to be elevated further to 9.1 in April 2019 by his primary care provider, [name], PA-C.  Accordingly, he was referred for evaluation in urology by Dr. Karsten Ro on 11/18/17, where a digital rectal examination was performed revealing no prostate nodules.  Repeat PSA at that time was 9.24.  The patient proceeded to transrectal ultrasound with 12 biopsies of the prostate on 12/21/17.  The prostate volume measured 68.45 cc.  Out of 12 core biopsies, 2 were positive.  The maximum Gleason score was 4+3, and this was seen in the left mid.  There was Gleason 3+3 in the left apex lateral.  Biopsies of prostate revealed:    PSA History: 06/14/2013: 4.14 11/24/2013: 5.32 07/03/2016: 6.3 09/29/2017: 9.1 11/18/2017: 9.24  The patient reviewed the biopsy results with his urologist and he has kindly been referred today for discussion of potential radiation treatment options. Of note, the patient does have a cardiac defibrillator.  PREVIOUS RADIATION THERAPY: No  PAST MEDICAL HISTORY:  Past Medical History:  Diagnosis Date  . BPH (benign prostatic  hyperplasia)   . CHF (congestive heart failure) ([place])   . Diabetes mellitus ([place])    Type II. Diet controlled  . Gout   . Hyperlipidemia   . Hypertension   . Nonischemic cardiomyopathy ([place])    a. s/p STJ CRTD  . Paroxysmal atrial fibrillation ([place]) 12/25/15  . Prostate cancer ([place])       PAST SURGICAL HISTORY: Past Surgical History:  Procedure Laterality Date  . CARDIAC DEFIBRILLATOR PLACEMENT  Jan 2014   SJM Quadra Assura implanted by Dr [name] in [place]:  Family History  Problem Relation Age of Onset  . Diabetes Mother   . Hyperlipidemia Mother   . Diabetes Brother   . Breast cancer Sister   . Prostate cancer Maternal Uncle   . Prostate cancer Cousin   . Prostate cancer Cousin   . Prostate cancer Cousin   . Prostate cancer Cousin     SOCIAL HISTORY:  Social History   Socioeconomic History  . Marital status: Widowed    Spouse name: Not on file  . Number of children: 1  . Years of education: Not on file  . Highest education level: Not on file  Occupational History    Comment: retired   Scientific laboratory technician  . Financial resource strain: Not on file  . Food insecurity:    Worry: Not on file    Inability: Not on file  . Transportation needs:    Medical: Not on file  Non-medical: Not on file  Tobacco Use  . Smoking status: Never Smoker  . Smokeless tobacco: Never Used  Substance and Sexual Activity  . Alcohol use: Yes    Comment: Occasional  . Drug use: No  . Sexual activity: Yes  Lifestyle  . Physical activity:    Days per week: Not on file    Minutes per session: Not on file  . Stress: Not on file  Relationships  . Social connections:    Talks on phone: Not on file    Gets together: Not on file    Attends religious service: Not on file    Active member of club or organization: Not on file    Attends meetings of clubs or organizations: Not on file    Relationship status: Not on file  . Intimate partner violence:    Fear  of current or ex partner: Not on file    Emotionally abused: Not on file    Physically abused: Not on file    Forced sexual activity: Not on file  Other Topics Concern  . Not on file  Social History Narrative   Previously lived in [place] before moving to [place].  He has recently relocated to [facility].  He likes regular cycles and frequently travels in a group with his friends out of the state.  He reports that he is widowed, and his wife died of medical complications while hospitalized.  ALLERGIES: Patient has no known allergies.  MEDICATIONS:  Current Outpatient Medications  Medication Sig Dispense Refill  . aspirin 81 MG tablet Take 81 mg by mouth daily.    [name] atorvastatin (LIPITOR) 80 MG tablet Take 1 tablet (80 mg total) by mouth daily. 90 tablet 3  . carvedilol (COREG) 25 MG tablet Take 1 tablet (25 mg total) by mouth 2 (two) times daily with a meal. 180 tablet 1  . ENTRESTO 97-103 MG TAKE 1 TABLET BY MOUTH TWICE DAILY 180 tablet 3  . furosemide (LASIX) 40 MG tablet TAKE 2 TABLETS BY MOUTH IN THE MORNING AND 1 TABLET BY MOUTH AT 3 PM 270 tablet 3  . furosemide (LASIX) 40 MG tablet Take 40 mg by mouth daily.    . metFORMIN (GLUCOPHAGE-XR) 750 MG 24 hr tablet Take once daily. 90 tablet 0  . sennosides-docusate sodium (SENOKOT-S) 8.6-50 MG tablet Take 1 tablet by mouth daily as needed. For constipation    . sildenafil (VIAGRA) 100 MG tablet Take 1 tablet (100 mg total) by mouth as needed for erectile dysfunction. 10 tablet 5  . spironolactone (ALDACTONE) 25 MG tablet Take 0.5 tablets (12.5 mg total) by mouth daily. 30 tablet 6   No current facility-administered medications for this encounter.     REVIEW OF SYSTEMS:  On review of systems, the patient reports that he is doing well overall. He denies any chest pain, shortness of breath, cough, fevers, chills, night sweats, or unintended weight changes. He denies any bowel disturbances, and denies abdominal pain, nausea or  vomiting. He denies any new musculoskeletal or joint aches or pains. He reports left leg swelling and pain following long drives to [place] and [place]. His IPSS was 3, indicating mild urinary symptoms with weak stream and nocturia. He reports urinary frequency and urgency is associated with Furosemide. He denies dysuria, hematuria, leakage or incontinence. His SHIM score is 17. He is able to complete sexual activity with most attempts. A complete review of systems is obtained and is otherwise negative.    PHYSICAL EXAM:  Wt Readings from Last 3 Encounters:  01/31/18 235 lb (106.6 kg)  01/12/18 235 lb (106.6 kg)  11/24/17 232 lb (105.2 kg)   Temp Readings from Last 3 Encounters:  01/31/18 98.2 F (36.8 C) (Oral)   BP Readings from Last 3 Encounters:  01/31/18 (!) 145/75  01/12/18 120/78  11/24/17 133/72   Pulse Readings from Last 3 Encounters:  01/31/18 (!) 59  01/12/18 60  11/24/17 (!) 59   Pain Assessment Pain Score: 0-No pain/10  In general this is a well appearing African-American male in no acute distress. He's alert and oriented x4 and appropriate throughout the examination. Cardiopulmonary assessment is negative for acute distress and he exhibits normal effort.    KPS = 100  100 - Normal; no complaints; no evidence of disease. 90   - Able to carry on normal activity; minor signs or symptoms of disease. 80   - Normal activity with effort; some signs or symptoms of disease. 6   - Cares for self; unable to carry on normal activity or to do active work. 60   - Requires occasional assistance, but is able to care for most of his personal needs. 50   - Requires considerable assistance and frequent medical care. 49   - Disabled; requires special care and assistance. 72   - Severely disabled; hospital admission is indicated although death not imminent. 57   - Very sick; hospital admission necessary; active supportive treatment necessary. 10   - Moribund; fatal processes  progressing rapidly. 0     - Dead  Karnofsky DA, Abelmann Tumacacori-Carmen, Craver LS and Burchenal [facility] [phone]) The use of the nitrogen mustards in the palliative treatment of carcinoma: with particular reference to bronchogenic carcinoma Cancer 1 634-56  LABORATORY DATA:  Lab Results  Component Value Date   WBC 6.1 09/29/2017   HGB 16.0 09/29/2017   HCT 47.7 09/29/2017   MCV 87.7 09/29/2017   PLT 309 09/29/2017   Lab Results  Component Value Date   NA 140 09/29/2017   K 5.4 (H) 09/29/2017   CL 105 09/29/2017   CO2 28 09/29/2017   Lab Results  Component Value Date   ALT 14 09/29/2017   AST 16 09/29/2017   ALKPHOS 55 03/12/2015   BILITOT 0.7 09/29/2017     RADIOGRAPHY: No results found.    IMPRESSION/PLAN: 1. 78 y.o. gentleman with unfavorable intermediate risk, Stage T1c adenocarcinoma of the prostate with Gleason Score of 4+3, and PSA of 9.24. Dr. Tammi Klippel and I discussed the patient's workup and outlined the nature of prostate cancer in this setting. The patient's T stage, Gleason's score, and PSA put him into the unfavorable intermediate risk group. Accordingly, he is eligible brachytherapy or external radiation. We discussed the available radiation techniques, and focused on the details and logistics and delivery. We also discussed the role of SpaceOAR in reducing the rectal toxicity associated with radiotherapy.  At the conclusion of our conversation, the patient is interested in moving forward with brachytherapy and use of SpaceOAR to reduce rectal toxicity from radiotherapy.  We will share our discussion with Dr. Karsten Ro and move forward with scheduling his [facility] preseed planning appointment in the near future.  The patient will be called by Romie Jumper in our office who will be working closely with him to coordinate OR scheduling and pre and post procedure appointments.  We will contact the pharmaceutical rep to ensure that [place] is available at the time of procedure.  He will have a  prostate MRI following his post-seed CT SIM to confirm appropriate distribution of the [place]. 2. Possible genetic predisposition to malignancy. The patient is a candidate for genetic testing given his personal and family history. He was offered referral and is interested, and a referral was placed.  In a visit lasting 60 minutes, greater than 50% of the time was spent face to face discussing his case, and coordinating the patient's care.    Carola Rhine, [facility]   Page Me  Seen with  _____________________________________  Sheral Apley Tammi Klippel, M.D.  This document serves as a record of services personally performed by Tyler Pita, MD and Shona Simpson, PA-C. It was created on their behalf by Rae Lips, a trained medical scribe. The creation of this record is based on the scribe's personal observations and the providers' statements to them. This document has been checked and approved by the attending providers.

## 2018-02-01 ENCOUNTER — Other Ambulatory Visit: Payer: Self-pay | Admitting: Radiation Oncology

## 2018-02-01 DIAGNOSIS — C61 Malignant neoplasm of prostate: Secondary | ICD-10-CM

## 2018-02-03 ENCOUNTER — Other Ambulatory Visit: Payer: Self-pay | Admitting: Physician Assistant

## 2018-02-03 ENCOUNTER — Telehealth: Payer: Self-pay | Admitting: Licensed Clinical Social Worker

## 2018-02-03 ENCOUNTER — Encounter: Payer: Self-pay | Admitting: Licensed Clinical Social Worker

## 2018-02-03 DIAGNOSIS — Z809 Family history of malignant neoplasm, unspecified: Secondary | ICD-10-CM | POA: Insufficient documentation

## 2018-02-03 DIAGNOSIS — C61 Malignant neoplasm of prostate: Secondary | ICD-10-CM | POA: Insufficient documentation

## 2018-02-03 DIAGNOSIS — E089 Diabetes mellitus due to underlying condition without complications: Secondary | ICD-10-CM

## 2018-02-03 NOTE — Telephone Encounter (Signed)
New genetic counseling referral received from Dr. Tammi Klippel. Pt has been scheduled to see Epimenio Foot for genetic counseling on 9/5 at 11am. Letter mailed to the pt.

## 2018-02-14 ENCOUNTER — Telehealth: Payer: Self-pay

## 2018-02-14 ENCOUNTER — Ambulatory Visit (INDEPENDENT_AMBULATORY_CARE_PROVIDER_SITE_OTHER): Payer: Medicare Other

## 2018-02-14 DIAGNOSIS — I5022 Chronic systolic (congestive) heart failure: Secondary | ICD-10-CM

## 2018-02-14 DIAGNOSIS — Z95810 Presence of automatic (implantable) cardiac defibrillator: Secondary | ICD-10-CM | POA: Diagnosis not present

## 2018-02-14 NOTE — Telephone Encounter (Signed)
Attempted to confirm remote transmission with pt. No answer and was unable to leave a message.   

## 2018-02-15 NOTE — Progress Notes (Signed)
EPIC Encounter for ICM Monitoring  Patient Name: Earl Bedwell Sr. is a 77 y.o. male Date: 02/15/2018 Primary Care Physican: Lavada Mesi Primary Cardiologist:[place] Electrophysiologist: Allred Dry Weight: 232lbs Bi-V Pacing: 98%       Heart Failure questions reviewed, pt asymptomatic.    Thoracic impedance normal.  Prescribed dosage: Furosemide 40 mg 1 tablet (40 mg total) daily.  Confirmed he is taking 1 tablet a day.    Labs: 09/29/2017 Creatinine 1.02, BUN 20, Potassium 5.4, Sodium 140, EGFR 71-82 07/03/2016 Creatinine 1.32, BUN 22, Potassium 4.9, Sodium 141 06/03/2016 Creatinine 1.53, BUN 43, Potassium 5.1, Sodium 139  04/20/2016 Creatinine 1.36, BUN 32, Potassium 5.0, Sodium 141  12/25/2015 Creatinine 1.22, BUN 31, Potassium 4.9, Sodium 139  08/07/2015 Creatinine 1.11, BUN 21, Potassium 4.5, Sodium 141  07/10/2015 Creatinine 1.14, BUN 19, Potassium 4.3, Sodium 141  Recommendations: No changes.   Encouraged to call for fluid symptoms.  Follow-up plan: ICM clinic phone appointment on 03/24/2018.    Copy of ICM check sent to Dr. Rayann Heman.   3 month ICM trend: 02/15/2018    1 Year ICM trend:       Rosalene Billings, RN 02/15/2018 8:53 AM

## 2018-03-02 ENCOUNTER — Telehealth: Payer: Self-pay | Admitting: Licensed Clinical Social Worker

## 2018-03-02 NOTE — Telephone Encounter (Signed)
lft the pt a vm to reschedule appt.

## 2018-03-03 ENCOUNTER — Inpatient Hospital Stay: Payer: Medicare Other

## 2018-03-03 ENCOUNTER — Encounter: Payer: Self-pay | Admitting: Genetics

## 2018-03-03 ENCOUNTER — Telehealth: Payer: Self-pay | Admitting: Genetics

## 2018-03-03 ENCOUNTER — Inpatient Hospital Stay: Payer: Medicare Other | Admitting: Licensed Clinical Social Worker

## 2018-03-03 NOTE — Telephone Encounter (Signed)
Pt's genetic counseling appt has been r/s for the pt to see Monica Martinez on 10/10 at 2pm. Pt has been cld and agreed to the appt date and time. New letter mailed.

## 2018-03-10 ENCOUNTER — Telehealth: Payer: Self-pay

## 2018-03-10 NOTE — Telephone Encounter (Signed)
Rescheduled appointment for gen/labs Per 9/12 patient walk in request.

## 2018-03-14 ENCOUNTER — Inpatient Hospital Stay: Payer: Medicare Other

## 2018-03-14 ENCOUNTER — Inpatient Hospital Stay: Payer: Medicare Other | Attending: Genetic Counselor | Admitting: Genetic Counselor

## 2018-03-14 DIAGNOSIS — Z8042 Family history of malignant neoplasm of prostate: Secondary | ICD-10-CM

## 2018-03-14 DIAGNOSIS — Z803 Family history of malignant neoplasm of breast: Secondary | ICD-10-CM

## 2018-03-14 DIAGNOSIS — C61 Malignant neoplasm of prostate: Secondary | ICD-10-CM

## 2018-03-15 ENCOUNTER — Encounter: Payer: Self-pay | Admitting: Genetic Counselor

## 2018-03-15 DIAGNOSIS — Z803 Family history of malignant neoplasm of breast: Secondary | ICD-10-CM | POA: Insufficient documentation

## 2018-03-15 DIAGNOSIS — Z8042 Family history of malignant neoplasm of prostate: Secondary | ICD-10-CM | POA: Insufficient documentation

## 2018-03-15 NOTE — Progress Notes (Signed)
REFERRING PROVIDER: Tyler Pita, MD [place], [place] [address]  PRIMARY PROVIDER:  Donella Stade, PA-C  PRIMARY REASON FOR VISIT:  1. Malignant neoplasm of prostate ([place])   2. Family history of prostate cancer   3. Family history of breast cancer      HISTORY OF PRESENT ILLNESS:   Earl White, a 77 y.o. male, was seen for a [place] cancer genetics consultation at the request of Dr. Tammi Klippel due to a personal and family history of cancer.  Earl White presents to clinic today to discuss the possibility of a hereditary predisposition to cancer, genetic testing, and to further clarify his future cancer risks, as well as potential cancer risks for family members.   In 2019, at the age of 55, Earl White was diagnosed with Prostate cancer.  The Gleason score is 4+3=7. This will be treated with radiation, but patient states that he refuses surgery.   CANCER HISTORY:   No history exists.      Past Medical History:  Diagnosis Date  . BPH (benign prostatic hyperplasia)   . CHF (congestive heart failure) ([place])   . Diabetes mellitus ([place])    Type II. Diet controlled  . Family history of breast cancer   . Family history of prostate cancer   . Gout   . Hyperlipidemia   . Hypertension   . Nonischemic cardiomyopathy ([place])    a. s/p STJ CRTD  . Paroxysmal atrial fibrillation ([place]) 12/25/15  . Prostate cancer [facility])     Past Surgical History:  Procedure Laterality Date  . CARDIAC DEFIBRILLATOR PLACEMENT  Jan 2014   SJM Quadra Assura implanted by Dr [name] in [place] History   Socioeconomic History  . Marital status: Widowed    Spouse name: Not on file  . Number of children: 1  . Years of education: Not on file  . Highest education level: Not on file  Occupational History    Comment: retired   Scientific laboratory technician  . Financial resource strain: Not on file  . Food insecurity:    Worry: Not on file    Inability: Not on file  .  Transportation needs:    Medical: Not on file    Non-medical: Not on file  Tobacco Use  . Smoking status: Never Smoker  . Smokeless tobacco: Never Used  Substance and Sexual Activity  . Alcohol use: Yes    Comment: Occasional  . Drug use: No  . Sexual activity: Yes  Lifestyle  . Physical activity:    Days per week: Not on file    Minutes per session: Not on file  . Stress: Not on file  Relationships  . Social connections:    Talks on phone: Not on file    Gets together: Not on file    Attends religious service: Not on file    Active member of club or organization: Not on file    Attends meetings of clubs or organizations: Not on file    Relationship status: Not on file  Other Topics Concern  . Not on file  Social History Narrative   Previously lived in [place] before moving to [place].  He has recently relocated to [facility].     FAMILY HISTORY:  We obtained a detailed, 4-generation family history.  Significant diagnoses are listed below: Family History  Problem Relation Age of Onset  . Diabetes Mother   . Hyperlipidemia Mother   . Heart attack Brother   .  Breast cancer Sister 70  . Prostate cancer Maternal Uncle   . Prostate cancer Cousin        [name] vet, agent orange exposure  . Prostate cancer Cousin        dx 28-30 yrs. - mat first cousin's son  . Prostate cancer Cousin   . Prostate cancer Cousin   . Diabetes Brother   . Throat cancer Brother 15       [place], ? due to agent orange exposure    The patient has one son and one grandson who are both cancer free.  He has three full brothers and two full sisters, and one maternal half brother.  His sister was diagnosed with breast cancer at 59, and one full brother was diagnosed with throat cancer.  Both parents are deceased.  The patient's mother died at 65 from old age.  She had multiple siblings, only one brother had prostate cancer.  Two different sisters had one son each with prostate cancer,  and one sister had a grandson with prostate cancer.  Both maternal grandparents are deceased from non cancer related issues.  The patient's father died for unknown reasons at 61.  He had two sisters and five brothers, none who had cancer.  The paternal grandparents are both deceased from non cancer related issues.  Earl White is unaware of previous family history of genetic testing for hereditary cancer risks. Patient's maternal ancestors are of African American descent, and paternal ancestors are of African American descent. There is no reported Ashkenazi Jewish ancestry. There is no known consanguinity.  GENETIC COUNSELING ASSESSMENT: Earl Currier Sr. is a 77 y.o. male with a personal history of prostate cancer and a family history of breast and prostate cancer which is somewhat suggestive of a hereditary cancer syndrome and predisposition to cancer. We, therefore, discussed and recommended the following at today's visit.   DISCUSSION: We discussed that up to 15% of prostate cancer is hereditary with most cases due to BRCA mutations.  There are other genes that are associated with an increased risk for both breast and prostate cancer.  Several family members had [place] and were exposed to Agent orange.  We discussed that while Agent Orange could contribute to the cancer in the family, there is still a possibility of a hereditary cause.  We reviewed the characteristics, features and inheritance patterns of hereditary cancer syndromes. We also discussed genetic testing, including the appropriate family members to test, the process of testing, insurance coverage and turn-around-time for results. We discussed the implications of a negative, positive and/or variant of uncertain significant result. We recommended Earl White pursue genetic testing for the Multi cancer gene panel. The Multi-Gene Panel offered by [facility] includes sequencing and/or deletion duplication testing of the following 84  genes: AIP, ALK, APC, ATM, AXIN2,BAP1,  BARD1, BLM, BMPR1A, BRCA1, BRCA2, BRIP1, CASR, CDC73, CDH1, CDK4, CDKN1B, CDKN1C, CDKN2A (p14ARF), CDKN2A (p16INK4a), CEBPA, CHEK2, CTNNA1, DICER1, DIS3L2, EGFR (c.2369C>T, p.Thr790Met variant only), EPCAM (Deletion/duplication testing only), FH, FLCN, GATA2, GPC3, GREM1 (Promoter region deletion/duplication testing only), HOXB13 (c.251G>A, p.Gly84Glu), HRAS, KIT, MAX, MEN1, MET, MITF (c.952G>A, p.Glu318Lys variant only), MLH1, MSH2, MSH3, MSH6, MUTYH, NBN, NF1, NF2, NTHL1, PALB2, PDGFRA, PHOX2B, PMS2, POLD1, POLE, POT1, PRKAR1A, PTCH1, PTEN, RAD50, RAD51C, RAD51D, RB1, RECQL4, RET, RUNX1, SDHAF2, SDHA (sequence changes only), SDHB, SDHC, SDHD, SMAD4, SMARCA4, SMARCB1, SMARCE1, STK11, SUFU, TERC, TERT, TMEM127, TP53, TSC1, TSC2, VHL, WRN and WT1.    Based on Earl White personal and  family history of cancer, he meets medical criteria for genetic testing. Despite that he meets criteria, he may still have an out of pocket cost. We discussed that if his out of pocket cost for testing is over $100, the laboratory will call and confirm whether he wants to proceed with testing.  If the out of pocket cost of testing is less than $100 he will be billed by the genetic testing laboratory.   PLAN: After considering the risks, benefits, and limitations, Earl White  provided informed consent to pursue genetic testing and the blood sample was sent to [facility] for analysis of the multi cancer panel. Results should be available within approximately 2-3 weeks' time, at which point they will be disclosed by telephone to Earl White, as will any additional recommendations warranted by these results. Earl White will receive a summary of his genetic counseling visit and a copy of his results once available. This information will also be available in Epic. We encouraged Earl White to remain in contact with cancer genetics annually so that we can continuously update the family  history and inform him of any changes in cancer genetics and testing that may be of benefit for his family. Earl White questions were answered to his satisfaction today. Our contact information was provided should additional questions or concerns arise.  Lastly, we encouraged Earl White to remain in contact with cancer genetics annually so that we can continuously update the family history and inform him of any changes in cancer genetics and testing that may be of benefit for this family.   Mr.  White questions were answered to his satisfaction today. Our contact information was provided should additional questions or concerns arise. Thank you for the referral and allowing [place] to share in the care of your patient.   Pesach Frisch P. Florene Glen, [place], [facility] Certified Genetic Counselor [email or web address] phone: [phone]  The patient was seen for a total of 40 minutes in face-to-face genetic counseling.  This patient was discussed with Drs. Magrinat, Lindi Adie and/or Burr Medico who agrees with the above.    _______________________________________________________________________ For Office Staff:  Number of people involved in session: 1 Was an Intern/ student involved with case: Yes, [employer]

## 2018-03-24 ENCOUNTER — Telehealth: Payer: Self-pay

## 2018-03-24 ENCOUNTER — Ambulatory Visit (INDEPENDENT_AMBULATORY_CARE_PROVIDER_SITE_OTHER): Payer: Medicare Other

## 2018-03-24 ENCOUNTER — Ambulatory Visit (INDEPENDENT_AMBULATORY_CARE_PROVIDER_SITE_OTHER): Payer: Medicare Other | Admitting: *Deleted

## 2018-03-24 ENCOUNTER — Telehealth: Payer: Self-pay | Admitting: Genetic Counselor

## 2018-03-24 DIAGNOSIS — Z95810 Presence of automatic (implantable) cardiac defibrillator: Secondary | ICD-10-CM

## 2018-03-24 DIAGNOSIS — I5022 Chronic systolic (congestive) heart failure: Secondary | ICD-10-CM | POA: Diagnosis not present

## 2018-03-24 DIAGNOSIS — I255 Ischemic cardiomyopathy: Secondary | ICD-10-CM

## 2018-03-24 NOTE — Telephone Encounter (Signed)
Revealed negative genetic testing.  Discussed that we do not know why he has prostate cancer or why there is cancer in the family. It could be due to a different gene that we are not testing, or maybe our current technology may not be able to pick something up.  It will be important for him to keep in contact with genetics to keep up with whether additional testing may be needed.  Revealed that there are two VUS.  This is still a normal result.  We will not change medical management based on these results.

## 2018-03-24 NOTE — Telephone Encounter (Signed)
LMOVM reminding pt to send remote transmission.   

## 2018-03-25 ENCOUNTER — Encounter: Payer: Self-pay | Admitting: Genetic Counselor

## 2018-03-25 ENCOUNTER — Telehealth: Payer: Self-pay

## 2018-03-25 ENCOUNTER — Encounter: Payer: Self-pay | Admitting: Cardiology

## 2018-03-25 ENCOUNTER — Ambulatory Visit: Payer: Self-pay | Admitting: Genetic Counselor

## 2018-03-25 DIAGNOSIS — Z1379 Encounter for other screening for genetic and chromosomal anomalies: Secondary | ICD-10-CM

## 2018-03-25 NOTE — Telephone Encounter (Signed)
Remote ICM transmission received.  Attempted call to patient and ine busy

## 2018-03-25 NOTE — Progress Notes (Signed)
EPIC Encounter for ICM Monitoring  Patient Name: Earl Mcevers Sr. is a 77 y.o. male Date: 03/25/2018 Primary Care Physican: Lavada Mesi Primary Cardiologist:Crenshaw Electrophysiologist: Allred Dry Weight: Previous weight 232lbs Bi-V Pacing: 98%      Attempted call to patient and unable to reach.    Transmission reviewed.    Thoracic impedance normal.  Prescribed: Furosemide 40 mg 1 tablet (40 mg total) daily.  Confirmed he is taking 1 tablet a day.    Labs: 09/29/2017 Creatinine 1.02, BUN 20, Potassium 5.4, Sodium 140, EGFR 71-82 07/03/2016 Creatinine 1.32, BUN 22, Potassium 4.9, Sodium 141 06/03/2016 Creatinine 1.53, BUN 43, Potassium 5.1, Sodium 139  04/20/2016 Creatinine 1.36, BUN 32, Potassium 5.0, Sodium 141  12/25/2015 Creatinine 1.22, BUN 31, Potassium 4.9, Sodium 139  08/07/2015 Creatinine 1.11, BUN 21, Potassium 4.5, Sodium 141  07/10/2015 Creatinine 1.14, BUN 19, Potassium 4.3, Sodium 141  Recommendations: Unable to reach.  Follow-up plan: ICM clinic phone appointment on 04/25/2018.    Copy of ICM check sent to Dr. Rayann Heman.   3 month ICM trend: 03/24/2018    1 Year ICM trend:       Rosalene Billings, RN 03/25/2018 10:36 AM

## 2018-03-25 NOTE — Progress Notes (Signed)
HPI:  [name] was previously seen in the [facility] clinic due to a personal and family history of cancer and concerns regarding a hereditary predisposition to cancer. Please refer to our prior cancer genetics clinic note for more information regarding [name] medical, social and family histories, and our assessment and recommendations, at the time. [name] recent genetic test results were disclosed to him, as were recommendations warranted by these results. These results and recommendations are discussed in more detail below.  CANCER HISTORY:    Malignant neoplasm of prostate ([place])   02/03/2018 Initial Diagnosis    Malignant neoplasm of prostate ([place])    03/24/2018 Genetic Testing    BMPR1A c.83G>A and RECQL4 c.1100G>A VUS identified in the multicancer panel.  The Multi-Gene Panel offered by [facility] includes sequencing and/or deletion duplication testing of the following 84 genes: AIP, ALK, APC, ATM, AXIN2,BAP1,  BARD1, BLM, BMPR1A, BRCA1, BRCA2, BRIP1, CASR, CDC73, CDH1, CDK4, CDKN1B, CDKN1C, CDKN2A (p14ARF), CDKN2A (p16INK4a), CEBPA, CHEK2, CTNNA1, DICER1, DIS3L2, EGFR (c.2369C>T, p.Thr790Met variant only), EPCAM (Deletion/duplication testing only), FH, FLCN, GATA2, GPC3, GREM1 (Promoter region deletion/duplication testing only), HOXB13 (c.251G>A, p.Gly84Glu), HRAS, KIT, MAX, MEN1, MET, MITF (c.952G>A, p.Glu318Lys variant only), MLH1, MSH2, MSH3, MSH6, MUTYH, NBN, NF1, NF2, NTHL1, PALB2, PDGFRA, PHOX2B, PMS2, POLD1, POLE, POT1, PRKAR1A, PTCH1, PTEN, RAD50, RAD51C, RAD51D, RB1, RECQL4, RET, RUNX1, SDHAF2, SDHA (sequence changes only), SDHB, SDHC, SDHD, SMAD4, SMARCA4, SMARCB1, SMARCE1, STK11, SUFU, TERC, TERT, TMEM127, TP53, TSC1, TSC2, VHL, WRN and WT1.  The report date is March 24, 2018.     FAMILY HISTORY:  We obtained a detailed, 4-generation family history.  Significant diagnoses are listed below: Family History  Problem Relation Age of Onset  . Diabetes Mother    . Hyperlipidemia Mother   . Heart attack Brother   . Breast cancer Sister 54  . Prostate cancer Maternal Uncle   . Prostate cancer Cousin        [name] vet, agent orange exposure  . Prostate cancer Cousin        dx 28-30 yrs. - mat first cousin's son  . Prostate cancer Cousin   . Prostate cancer Cousin   . Diabetes Brother   . Throat cancer Brother 48       [place], ? due to agent orange exposure    The patient has one son and one grandson who are both cancer free.  He has three full brothers and two full sisters, and one maternal half brother.  His sister was diagnosed with breast cancer at 48, and one full brother was diagnosed with throat cancer.  Both parents are deceased.  The patient's mother died at 67 from old age.  She had multiple siblings, only one brother had prostate cancer.  Two different sisters had one son each with prostate cancer, and one sister had a grandson with prostate cancer.  Both maternal grandparents are deceased from non cancer related issues.  The patient's father died for unknown reasons at 90.  He had two sisters and five brothers, none who had cancer.  The paternal grandparents are both deceased from non cancer related issues.  [name] is unaware of previous family history of genetic testing for hereditary cancer risks. Patient's maternal ancestors are of African American descent, and paternal ancestors are of African American descent. There is no reported Ashkenazi Jewish ancestry. There is no known consanguinity.  GENETIC TEST RESULTS: Genetic testing reported out on March 24, 2018 through the multi-cancer panel  found no deleterious mutations.  The Multi-Gene Panel offered by [facility] includes sequencing and/or deletion duplication testing of the following 84 genes: AIP, ALK, APC, ATM, AXIN2,BAP1,  BARD1, BLM, BMPR1A, BRCA1, BRCA2, BRIP1, CASR, CDC73, CDH1, CDK4, CDKN1B, CDKN1C, CDKN2A (p14ARF), CDKN2A (p16INK4a), CEBPA, CHEK2, CTNNA1,  DICER1, DIS3L2, EGFR (c.2369C>T, p.Thr790Met variant only), EPCAM (Deletion/duplication testing only), FH, FLCN, GATA2, GPC3, GREM1 (Promoter region deletion/duplication testing only), HOXB13 (c.251G>A, p.Gly84Glu), HRAS, KIT, MAX, MEN1, MET, MITF (c.952G>A, p.Glu318Lys variant only), MLH1, MSH2, MSH3, MSH6, MUTYH, NBN, NF1, NF2, NTHL1, PALB2, PDGFRA, PHOX2B, PMS2, POLD1, POLE, POT1, PRKAR1A, PTCH1, PTEN, RAD50, RAD51C, RAD51D, RB1, RECQL4, RET, RUNX1, SDHAF2, SDHA (sequence changes only), SDHB, SDHC, SDHD, SMAD4, SMARCA4, SMARCB1, SMARCE1, STK11, SUFU, TERC, TERT, TMEM127, TP53, TSC1, TSC2, VHL, WRN and WT1.   The test report has been scanned into EPIC and is located under the Molecular Pathology section of the Results Review tab.    We discussed with [name] that since the current genetic testing is not perfect, it is possible there may be a gene mutation in one of these genes that current testing cannot detect, but that chance is small.  We also discussed, that it is possible that another gene that has not yet been discovered, or that we have not yet tested, is responsible for the cancer diagnoses in the family, and it is, therefore, important to remain in touch with cancer genetics in the future so that we can continue to offer [name] the most up to date genetic testing.   Genetic testing did detect a Variant of Unknown Significance in the BMPR1A gene called c.83G>A and in the RECQL4 gene called c.1100G.A. At this time, it is unknown if this variant is associated with increased cancer risk or if this is a normal finding, but most variants such as this get reclassified to being inconsequential. It should not be used to make medical management decisions. With time, we suspect the lab will determine the significance of this variant, if any. If we do learn more about it, we will try to contact [name] to discuss it further. However, it is important to stay in touch with [place] periodically and keep the  address and phone number up to date.   CANCER SCREENING RECOMMENDATIONS: This result is reassuring and indicates that [name] likely does not have an increased risk for a future cancer due to a mutation in one of these genes. This normal test also suggests that [name] cancer was most likely not due to an inherited predisposition associated with one of these genes.  Most cancers happen by chance and this negative test suggests that his cancer falls into this category.  We, therefore, recommended he continue to follow the cancer management and screening guidelines provided by his oncology and primary healthcare provider.   An individual's cancer risk and medical management are not determined by genetic test results alone. Overall cancer risk assessment incorporates additional factors, including personal medical history, family history, and any available genetic information that may result in a personalized plan for cancer prevention and surveillance.  RECOMMENDATIONS FOR FAMILY MEMBERS:  Women in this family might be at some increased risk of developing cancer, over the general population risk, simply due to the family history of cancer.  We recommended women in this family have a yearly mammogram beginning at age 5, or 75 years younger than the earliest onset of cancer, an annual clinical breast exam, and perform monthly breast self-exams. Women in this family should also  have a gynecological exam as recommended by their primary provider. All family members should have a colonoscopy by age 24.  FOLLOW-UP: Lastly, we discussed with [name] that cancer genetics is a rapidly advancing field and it is possible that new genetic tests will be appropriate for him and/or his family members in the future. We encouraged him to remain in contact with cancer genetics on an annual basis so we can update his personal and family histories and let him know of advances in cancer genetics that may benefit this  family.   Our contact number was provided. [name] questions were answered to his satisfaction, and he knows he is welcome to call us at anytime with additional questions or concerns.   Roma Kayser, MS, [facility] Certified Genetic Counselor [email or web address]

## 2018-03-25 NOTE — Progress Notes (Signed)
Remote ICD transmission.   

## 2018-04-07 ENCOUNTER — Other Ambulatory Visit: Payer: Medicare Other

## 2018-04-07 ENCOUNTER — Encounter: Payer: Medicare Other | Admitting: Genetics

## 2018-04-25 ENCOUNTER — Ambulatory Visit (INDEPENDENT_AMBULATORY_CARE_PROVIDER_SITE_OTHER): Payer: Medicare Other

## 2018-04-25 DIAGNOSIS — I5022 Chronic systolic (congestive) heart failure: Secondary | ICD-10-CM

## 2018-04-25 DIAGNOSIS — Z95810 Presence of automatic (implantable) cardiac defibrillator: Secondary | ICD-10-CM | POA: Diagnosis not present

## 2018-04-26 NOTE — Progress Notes (Signed)
EPIC Encounter for ICM Monitoring  Patient Name: Earl Mahon Sr. is a 77 y.o. male Date: 04/26/2018 Primary Care Physican: Lavada Mesi Primary Cardiologist:Crenshaw Electrophysiologist: Allred Dry Weight:  237lbs Bi-V Pacing: 98%           Heart Failure questions reviewed, pt asymptomatic. Asked if he has missed any medication dosages and he said no.  He recalled having bursting colors with his eyesight while driving on day of SVT but went away within about an hour.   Thoracic impedance slightly abnormal but almost back at baseline.  He was on vacation during the time of decreased impedance.  Report reviewed by Memory Dance, device RN due to alerts of SVT on 04/11/2018.    Prescribed: Furosemide 40 mg1tablet (40 mg total) daily.  Labs: 09/29/2017 Creatinine 1.02, BUN 20, Potassium 5.4, Sodium 140, EGFR 71-82  Recommendations: No changes.  Reinforced limiting salt intake to < 2000 mg daily.  Encouraged to call for fluid symptoms.  Follow-up plan: ICM clinic phone appointment on 05/30/2018 to recheck fluid levels.       Copy of ICM check sent to Dr. Rayann Heman and Dr [name].   3 month ICM trend: 04/25/2018    1 Year ICM trend:       Rosalene Billings, RN 04/26/2018 4:16 PM

## 2018-05-04 ENCOUNTER — Other Ambulatory Visit: Payer: Self-pay | Admitting: Physician Assistant

## 2018-05-04 DIAGNOSIS — I10 Essential (primary) hypertension: Secondary | ICD-10-CM

## 2018-05-04 DIAGNOSIS — I5023 Acute on chronic systolic (congestive) heart failure: Secondary | ICD-10-CM

## 2018-05-04 DIAGNOSIS — I420 Dilated cardiomyopathy: Secondary | ICD-10-CM

## 2018-05-04 LAB — CUP PACEART REMOTE DEVICE CHECK
Battery Remaining Longevity: 29 mo
Brady Statistic AP VP Percent: 88 %
Brady Statistic AP VS Percent: 1.2 %
Brady Statistic AS VS Percent: 1 %
Brady Statistic RA Percent Paced: 88 %
Date Time Interrogation Session: 20190926171726
HIGH POWER IMPEDANCE MEASURED VALUE: 41 Ohm
HighPow Impedance: 41 Ohm
Implantable Lead Implant Date: 20140117
Implantable Lead Implant Date: 20140117
Implantable Pulse Generator Implant Date: 20140117
Lead Channel Impedance Value: 440 Ohm
Lead Channel Impedance Value: 440 Ohm
Lead Channel Pacing Threshold Amplitude: 0.875 V
Lead Channel Pacing Threshold Amplitude: 1 V
Lead Channel Pacing Threshold Pulse Width: 0.5 ms
Lead Channel Sensing Intrinsic Amplitude: 5 mV
Lead Channel Setting Pacing Amplitude: 2 V
Lead Channel Setting Pacing Amplitude: 2 V
Lead Channel Setting Pacing Pulse Width: 0.5 ms
Lead Channel Setting Sensing Sensitivity: 0.5 mV
MDC IDC LEAD IMPLANT DT: 20140117
MDC IDC LEAD LOCATION: 753858
MDC IDC LEAD LOCATION: 753859
MDC IDC LEAD LOCATION: 753860
MDC IDC MSMT BATTERY REMAINING PERCENTAGE: 30 %
MDC IDC MSMT BATTERY VOLTAGE: 2.87 V
MDC IDC MSMT LEADCHNL LV IMPEDANCE VALUE: 900 Ohm
MDC IDC MSMT LEADCHNL LV PACING THRESHOLD PULSEWIDTH: 0.5 ms
MDC IDC MSMT LEADCHNL RA PACING THRESHOLD AMPLITUDE: 0.75 V
MDC IDC MSMT LEADCHNL RV PACING THRESHOLD PULSEWIDTH: 0.5 ms
MDC IDC MSMT LEADCHNL RV SENSING INTR AMPL: 11.6 mV
MDC IDC PG SERIAL: 7070427
MDC IDC SET LEADCHNL LV PACING PULSEWIDTH: 0.5 ms
MDC IDC SET LEADCHNL RA PACING AMPLITUDE: 1.75 V
MDC IDC STAT BRADY AS VP PERCENT: 10 %

## 2018-05-11 NOTE — Progress Notes (Signed)
Subjective:   Earl Safley Sr. is a 77 y.o. male who presents for an Initial Medicare Annual Wellness Visit.  Review of Systems  No ROS.  Medicare Wellness Visit. Additional risk factors are reflected in the social history.  Cardiac Risk Factors include: obesity (BMI >30kg/m2);diabetes mellitus;advanced age (>26men, >26 women)  Sleep patterns: Sleeps 5 hours a night. Feels rested upon waking.   Home Safety/Smoke Alarms: Feels safe in home. Smoke alarms in place.  Living environment; Lives alone 1 story home. Steps have handrails on them. Shower is a walk in shower with grab rails present.     Male:   CCS- per pt 3-4 years ago in [place]    PSA- utd Lab Results  Component Value Date   PSA 9.1 (H) 09/29/2017   PSA 6.3 (H) 07/03/2016   PSA 5.32 (H) 11/24/2013       Objective:    Today's Vitals   05/17/18 1529 05/17/18 1533  BP: (!) 100/46 (!) 110/50  Pulse: (!) 59   SpO2: 94%   Weight: 238 lb (108 kg)   Height: 5\' 10"  (1.778 m)    Body mass index is 34.15 kg/m.  Advanced Directives 05/17/2018  Does Patient Have a Medical Advance Directive? Yes  Type of Advance Directive Living will  Does patient want to make changes to medical advance directive? No - Patient declined    Current Medications (verified) Outpatient Encounter Medications as of 05/17/2018  Medication Sig  . aspirin 81 MG tablet Take 81 mg by mouth daily.  [name] atorvastatin (LIPITOR) 80 MG tablet Take 1 tablet (80 mg total) by mouth daily.  . carvedilol (COREG) 25 MG tablet Take 1 tablet (25 mg total) by mouth 2 (two) times daily with a meal. Needs follow up with PCP  . ENTRESTO 97-103 MG TAKE 1 TABLET BY MOUTH TWICE DAILY  . furosemide (LASIX) 40 MG tablet Take 40 mg by mouth daily.  . metFORMIN (GLUCOPHAGE-XR) 750 MG 24 hr tablet TAKE 1 TABLET BY MOUTH ONCE DAILY WITH BREAKFAST  . sennosides-docusate sodium (SENOKOT-S) 8.6-50 MG tablet Take 1 tablet by mouth daily as needed. For constipation  .  sildenafil (VIAGRA) 100 MG tablet Take 1 tablet (100 mg total) by mouth as needed for erectile dysfunction.  [name] spironolactone (ALDACTONE) 25 MG tablet Take 0.5 tablets (12.5 mg total) by mouth daily.  . [DISCONTINUED] furosemide (LASIX) 40 MG tablet TAKE 2 TABLETS BY MOUTH IN THE MORNING AND 1 TABLET BY MOUTH AT 3 PM  . [DISCONTINUED] metFORMIN (GLUCOPHAGE-XR) 750 MG 24 hr tablet TAKE 1 TABLET BY MOUTH ONCE DAILY   No facility-administered encounter medications on file as of 05/17/2018.     Allergies (verified) Patient has no known allergies.   History: Past Medical History:  Diagnosis Date  . BPH (benign prostatic hyperplasia)   . CHF (congestive heart failure) ([place])   . Diabetes mellitus ([place])    Type II. Diet controlled  . Family history of breast cancer   . Family history of prostate cancer   . Gout   . Hyperlipidemia   . Hypertension   . Nonischemic cardiomyopathy ([place])    a. s/p STJ CRTD  . Paroxysmal atrial fibrillation ([place]) 12/25/15  . Prostate cancer [facility])    Past Surgical History:  Procedure Laterality Date  . CARDIAC DEFIBRILLATOR PLACEMENT  Jan 2014   SJM Quadra Assura implanted by Dr [name] in [place]   Family History  Problem Relation Age of Onset  . Diabetes Mother   .  Hyperlipidemia Mother   . Heart attack Brother   . Breast cancer Sister 65  . Prostate cancer Maternal Uncle   . Prostate cancer Cousin        [name] vet, agent orange exposure  . Prostate cancer Cousin        dx 28-30 yrs. - mat first cousin's son  . Prostate cancer Cousin   . Prostate cancer Cousin   . Diabetes Brother   . Throat cancer Brother 76       [place] vet, ? due to agent orange exposure   Social History   Socioeconomic History  . Marital status: Widowed    Spouse name: Not on file  . Number of children: 1  . Years of education: masters  . Highest education level: Master's degree (e.g., MA, MS, MEng, MEd, MSW, MBA)  Occupational History  . Occupation:  retired    Comment: [employer]  Social Needs  . Financial resource strain: Not hard at all  . Food insecurity:    Worry: Never true    Inability: Never true  . Transportation needs:    Medical: No    Non-medical: No  Tobacco Use  . Smoking status: Never Smoker  . Smokeless tobacco: Never Used  Substance and Sexual Activity  . Alcohol use: Yes    Comment: Occasional  . Drug use: No  . Sexual activity: Yes  Lifestyle  . Physical activity:    Days per week: 3 days    Minutes per session: 10 min  . Stress: Not at all  Relationships  . Social connections:    Talks on phone: More than three times a week    Gets together: Three times a week    Attends religious service: Never    Active member of club or organization: No    Attends meetings of clubs or organizations: Never    Relationship status: Widowed  Other Topics Concern  . Not on file  Social History Narrative   Previously lived in [place] before moving to [place].  He has recently relocated to [facility]. Still drives his Earl White all the time.   Tobacco Counseling Counseling given: Not Answered   Clinical Intake:  Pre-visit preparation completed: Yes  Pain : No/denies pain     Nutritional Risks: None Diabetes: Yes CBG done?: No Did pt. bring in CBG monitor from home?: No  How often do you need to have someone help you when you read instructions, pamphlets, or other written materials from your doctor or pharmacy?: 1 - Never What is the last grade level you completed in school?: masters     Information entered by :: Orlie Dakin, LPN  Activities of Daily Living In your present state of health, do you have any difficulty performing the following activities: 05/17/2018  Hearing? N  Vision? N  Difficulty concentrating or making decisions? N  Walking or climbing stairs? N  Dressing or bathing? N  Doing errands, shopping? N  Preparing Food and eating ? N  Using the Toilet? N  In the past six months,  have you accidently leaked urine? N  Do you have problems with loss of bowel control? N  Managing your Medications? N  Managing your Finances? N  Housekeeping or managing your Housekeeping? N  Some recent data might be hidden     Immunizations and Health Maintenance Immunization History  Administered Date(s) Administered  . Pneumococcal Conjugate-13 08/07/2015  . Tdap 11/02/2014   Health Maintenance Due  Topic Date  Due  . OPHTHALMOLOGY EXAM  10/28/1950  . PNA vac Low Risk Adult (2 of 2 - PPSV23) 08/06/2016  . INFLUENZA VACCINE  01/27/2018  . FOOT EXAM  03/05/2018  . URINE MICROALBUMIN  03/05/2018  . HEMOGLOBIN A1C  03/31/2018    Patient Care Team: Lavada Mesi as PCP - General (Family Medicine) Patsey Berthold, NP as Nurse Practitioner (Cardiology)  Indicate any recent Medical Services you may have received from other than [facility] providers in the past year (date may be approximate).    Assessment:   This is a routine wellness examination for Earl White.Physical assessment deferred to PCP.   Hearing/Vision screen  Visual Acuity Screening   Right eye Left eye Both eyes  Without correction: 20/30 20/40 20/25   With correction:     Hearing Screening Comments: Whisper test done patient got all 3 words right.  Dietary issues and exercise activities discussed: Current Exercise Habits: Home exercise routine, Type of exercise: walking;treadmill;strength training/weights, Time (Minutes): 10, Frequency (Times/Week): 3, Weekly Exercise (Minutes/Week): 30, Intensity: Mild, Exercise limited by: cardiac condition(s) Diet Eats fairly healthy . Breakfast: cream of wheat or oatmeal. Eggs omelet Lunch: skip Dinner:  Meats vegetables fish seafood     Goals   None    Depression Screen PHQ 2/9 Scores 05/17/2018 01/31/2018 03/05/2017  PHQ - 2 Score 0 0 0    Fall Risk Fall Risk  05/17/2018 01/31/2018 06/03/2017 06/05/2016  Falls in the past year? 0 No No No  Comment - - Emmi Telephone  Survey: data to providers prior to load Emmi Telephone Survey: data to providers prior to load    Is the patient's home free of loose throw rugs in walkways, pet beds, electrical cords, etc?   yes      Grab bars in the bathroom? yes      Handrails on the stairs?   yes      Adequate lighting?   yes  Cognitive Function:     6CIT Screen 05/17/2018  What Year? 0 points  What month? 0 points  What time? 0 points  Count back from 20 0 points  Months in reverse 0 points  Repeat phrase 2 points  Total Score 2    Screening Tests Health Maintenance  Topic Date Due  . OPHTHALMOLOGY EXAM  10/28/1950  . PNA vac Low Risk Adult (2 of 2 - PPSV23) 08/06/2016  . INFLUENZA VACCINE  01/27/2018  . FOOT EXAM  03/05/2018  . URINE MICROALBUMIN  03/05/2018  . HEMOGLOBIN A1C  03/31/2018  . TETANUS/TDAP  11/01/2024      Plan:    Please schedule your next medicare wellness visit with me in 1 yr.  Earl White , Thank you for taking time to come for your Medicare Wellness Visit. I appreciate your ongoing commitment to your health goals. Please review the following plan we discussed and let me know if I can assist you in the future.   These are the goals we discussed: Goals   None     This is a list of the screening recommended for you and due dates:  Health Maintenance  Topic Date Due  . Eye exam for diabetics  10/28/1950  . Pneumonia vaccines (2 of 2 - PPSV23) 08/06/2016  . Flu Shot  01/27/2018  . Complete foot exam   03/05/2018  . Urine Protein Check  03/05/2018  . Hemoglobin A1C  03/31/2018  . Tetanus Vaccine  11/01/2024     I have personally reviewed and  noted the following in the patient's chart:   . Medical and social history . Use of alcohol, tobacco or illicit drugs  . Current medications and supplements . Functional ability and status . Nutritional status . Physical activity . Advanced directives . List of other physicians . Hospitalizations, surgeries, and ER visits in  previous 12 months . Vitals . Screenings to include cognitive, depression, and falls . Referrals and appointments  In addition, I have reviewed and discussed with patient certain preventive protocols, quality metrics, and best practice recommendations. A written personalized care plan for preventive services as well as general preventive health recommendations were provided to patient.     Joanne Chars, LPN   [id number]

## 2018-05-17 ENCOUNTER — Ambulatory Visit (INDEPENDENT_AMBULATORY_CARE_PROVIDER_SITE_OTHER): Payer: Medicare Other | Admitting: *Deleted

## 2018-05-17 VITALS — BP 110/50 | HR 59 | Ht 70.0 in | Wt 238.0 lb

## 2018-05-17 DIAGNOSIS — Z0000 Encounter for general adult medical examination without abnormal findings: Secondary | ICD-10-CM | POA: Diagnosis not present

## 2018-05-17 NOTE — Patient Instructions (Addendum)
Please schedule your next medicare wellness visit with me in 1 yr.  Earl White , Thank you for taking time to come for your Medicare Wellness Visit. I appreciate your ongoing commitment to your health goals. Please review the following plan we discussed and let me know if I can assist you in the future.    Health Maintenance, Male A healthy lifestyle and preventive care is important for your health and wellness. Ask your health care provider about what schedule of regular examinations is right for you. What should I know about weight and diet? Eat a Healthy Diet  Eat plenty of vegetables, fruits, whole grains, low-fat dairy products, and lean protein.  Do not eat a lot of foods high in solid fats, added sugars, or salt.  Maintain a Healthy Weight Regular exercise can help you achieve or maintain a healthy weight. You should:  Do at least 150 minutes of exercise each week. The exercise should increase your heart rate and make you sweat (moderate-intensity exercise).  Do strength-training exercises at least twice a week.  Watch Your Levels of Cholesterol and Blood Lipids  Have your blood tested for lipids and cholesterol every 5 years starting at 77 years of age. If you are at high risk for heart disease, you should start having your blood tested when you are 77 years old. You may need to have your cholesterol levels checked more often if: ? Your lipid or cholesterol levels are high. ? You are older than 77 years of age. ? You are at high risk for heart disease.  What should I know about cancer screening? Many types of cancers can be detected early and may often be prevented. Lung Cancer  You should be screened every year for lung cancer if: ? You are a current smoker who has smoked for at least 30 years. ? You are a former smoker who has quit within the past 15 years.  Talk to your health care provider about your screening options, when you should start screening, and how often  you should be screened.  Colorectal Cancer  Routine colorectal cancer screening usually begins at 77 years of age and should be repeated every 5-10 years until you are 77 years old. You may need to be screened more often if early forms of precancerous polyps or small growths are found. Your health care provider may recommend screening at an earlier age if you have risk factors for colon cancer.  Your health care provider may recommend using home test kits to check for hidden blood in the stool.  A small camera at the end of a tube can be used to examine your colon (sigmoidoscopy or colonoscopy). This checks for the earliest forms of colorectal cancer.  Prostate and Testicular Cancer  Depending on your age and overall health, your health care provider may do certain tests to screen for prostate and testicular cancer.  Talk to your health care provider about any symptoms or concerns you have about testicular or prostate cancer.  Skin Cancer  Check your skin from head to toe regularly.  Tell your health care provider about any new moles or changes in moles, especially if: ? There is a change in a mole's size, shape, or color. ? You have a mole that is larger than a pencil eraser.  Always use sunscreen. Apply sunscreen liberally and repeat throughout the day.  Protect yourself by wearing long sleeves, pants, a wide-brimmed hat, and sunglasses when outside.  What should I know  about heart disease, diabetes, and high blood pressure?  If you are 45-31 years of age, have your blood pressure checked every 3-5 years. If you are 55 years of age or older, have your blood pressure checked every year. You should have your blood pressure measured twice-once when you are at a hospital or clinic, and once when you are not at a hospital or clinic. Record the average of the two measurements. To check your blood pressure when you are not at a hospital or clinic, you can use: ? An automated blood pressure  machine at a pharmacy. ? A home blood pressure monitor.  Talk to your health care provider about your target blood pressure.  If you are between 7-19 years old, ask your health care provider if you should take aspirin to prevent heart disease.  Have regular diabetes screenings by checking your fasting blood sugar level. ? If you are at a normal weight and have a low risk for diabetes, have this test once every three years after the age of 105. ? If you are overweight and have a high risk for diabetes, consider being tested at a younger age or more often.  A one-time screening for abdominal aortic aneurysm (AAA) by ultrasound is recommended for men aged 57-75 years who are current or former smokers. What should I know about preventing infection? Hepatitis B If you have a higher risk for hepatitis B, you should be screened for this virus. Talk with your health care provider to find out if you are at risk for hepatitis B infection. Hepatitis C Blood testing is recommended for:  Everyone born from [birth year] through 1965.  Anyone with known risk factors for hepatitis C.  Sexually Transmitted Diseases (STDs)  You should be screened each year for STDs including gonorrhea and chlamydia if: ? You are sexually active and are younger than 77 years of age. ? You are older than 77 years of age and your health care provider tells you that you are at risk for this type of infection. ? Your sexual activity has changed since you were last screened and you are at an increased risk for chlamydia or gonorrhea. Ask your health care provider if you are at risk.  Talk with your health care provider about whether you are at high risk of being infected with HIV. Your health care provider may recommend a prescription medicine to help prevent HIV infection.  What else can I do?  Schedule regular health, dental, and eye exams.  Stay current with your vaccines (immunizations).  Do not use any tobacco products,  such as cigarettes, chewing tobacco, and e-cigarettes. If you need help quitting, ask your health care provider.  Limit alcohol intake to no more than 2 drinks per day. One drink equals 12 ounces of beer, 5 ounces of wine, or 1 ounces of hard liquor.  Do not use street drugs.  Do not share needles.  Ask your health care provider for help if you need support or information about quitting drugs.  Tell your health care provider if you often feel depressed.  Tell your health care provider if you have ever been abused or do not feel safe at home. This information is not intended to replace advice given to you by your health care provider. Make sure you discuss any questions you have with your health care provider. Document Released: 12/12/2007 Document Revised: 02/12/2016 Document Reviewed: 03/19/2015 Elsevier Interactive Patient Education  2018 [employer]. Patient BP too low discussed with  [name] PA-C and she advised me to inform the patient to cut his Lasix 40mg  in half tomorrow and I have him scheduled to see Jade for 05/18/2018 at 2:10. KG LPN

## 2018-05-18 ENCOUNTER — Encounter: Payer: Self-pay | Admitting: Physician Assistant

## 2018-05-18 ENCOUNTER — Ambulatory Visit (INDEPENDENT_AMBULATORY_CARE_PROVIDER_SITE_OTHER): Payer: Medicare Other | Admitting: Physician Assistant

## 2018-05-18 VITALS — BP 109/57 | HR 60 | Ht 70.0 in | Wt 244.0 lb

## 2018-05-18 DIAGNOSIS — I959 Hypotension, unspecified: Secondary | ICD-10-CM | POA: Diagnosis not present

## 2018-05-18 DIAGNOSIS — Z95810 Presence of automatic (implantable) cardiac defibrillator: Secondary | ICD-10-CM | POA: Diagnosis not present

## 2018-05-18 DIAGNOSIS — Z131 Encounter for screening for diabetes mellitus: Secondary | ICD-10-CM | POA: Diagnosis not present

## 2018-05-18 DIAGNOSIS — I5023 Acute on chronic systolic (congestive) heart failure: Secondary | ICD-10-CM

## 2018-05-18 DIAGNOSIS — I10 Essential (primary) hypertension: Secondary | ICD-10-CM

## 2018-05-18 DIAGNOSIS — I420 Dilated cardiomyopathy: Secondary | ICD-10-CM

## 2018-05-18 DIAGNOSIS — I255 Ischemic cardiomyopathy: Secondary | ICD-10-CM

## 2018-05-18 DIAGNOSIS — E089 Diabetes mellitus due to underlying condition without complications: Secondary | ICD-10-CM | POA: Diagnosis not present

## 2018-05-18 LAB — POCT GLYCOSYLATED HEMOGLOBIN (HGB A1C): HEMOGLOBIN A1C: 7.2 % — AB (ref 4.0–5.6)

## 2018-05-18 MED ORDER — FUROSEMIDE 40 MG PO TABS
ORAL_TABLET | ORAL | 2 refills | Status: DC
Start: 2018-05-18 — End: 2018-07-04

## 2018-05-18 MED ORDER — SPIRONOLACTONE 25 MG PO TABS: 12.5000 mg | ORAL_TABLET | Freq: Every day | ORAL | 6 refills | Status: DC

## 2018-05-18 NOTE — Progress Notes (Signed)
Subjective:    Patient ID: Earl Hatcher Sr., male    DOB: 04/23/1941, 77 y.o.   MRN: [id number]  HPI Pt is a 77 yo male with CHF, HTN, T2DM, BPH who presents to the clinic with low BP at his encounter for medicare wellness 2 days ago.   Pt admits to feeling weak and tired for the last month. No fever, chills, body aches, cough. No swelling. Pt is still sleeping the same. He is not checking his BP at home. He is taking same medications as directed. No new changes.   .. Active Ambulatory Problems    Diagnosis Date Noted  . Essential hypertension, benign 04/28/2013  . Diabetes mellitus due to underlying condition, controlled, without complication, 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[place]) 04/28/2013  . BPH (benign prostatic hyperplasia) 04/28/2013  . Gout 04/28/2013  . Congestive dilated cardiomyopathy ([place]) 05/10/2013  . ICD (implantable cardioverter-defibrillator) in place 05/10/2013  . Elevated PSA 11/24/2013  . 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 Morbid obesity ([place]) 09/03/2014  . Erectile dysfunction 03/10/2015  . Lower extremity edema 09/29/2017  . Abnormal ultrasound of lower extremity 09/29/2017  . Malignant neoplasm of prostate ([place]) 02/03/2018  . Family history of cancer 02/03/2018  . Family history of prostate cancer   . Family history of breast cancer   . Genetic testing 03/25/2018  . Hypotension 05/23/2018   Resolved Ambulatory Problems    Diagnosis Date Noted  . No Resolved Ambulatory Problems   Past Medical History:  Diagnosis Date  . CHF (congestive heart failure) ([place])   . Diabetes mellitus ([place])   . Hyperlipidemia   . Hypertension   . Nonischemic cardiomyopathy ([place])   . Paroxysmal atrial fibrillation ([place]) 12/25/15  . Prostate cancer [facility])       Review of Systems  All other systems reviewed and are negative.      Objective:   Physical Exam  Constitutional: He is oriented to person, place, and time.  He appears well-developed and well-nourished.  HENT:  Head: Normocephalic and atraumatic.  Cardiovascular: Normal rate and regular rhythm.  Pulmonary/Chest: Effort normal and breath sounds normal.  Neurological: He is alert and oriented to person, place, and time.  Skin:  No edema.   Psychiatric: He has a normal mood and affect. His behavior is normal.          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 -     furosemide (LASIX) 40 MG tablet; Take one half tablet daily.  Congestive dilated cardiomyopathy (HCC) -     spironolactone (ALDACTONE) 25 MG tablet; Take 0.5 tablets (12.5 mg total) by mouth daily. -     furosemide (LASIX) 40 MG tablet; Take one half tablet daily.  Essential hypertension, benign -     furosemide (LASIX) 40 MG tablet; Take one half tablet daily.  Diabetes mellitus due to underlying condition, controlled, without complication, 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HCC) -     POCT glycosylated hemoglobin (Hb A1C)   .. Lab Results  Component Value Date   HGBA1C 7.2 (A) 05/18/2018   Due to low BP. Cut lasix in half. Keep everything else the same. Recheck in 3 days. Keep watching weight  and for increase in swelling.  Encouraged to make cardiology appt.    A!C not to goal.  Pt does not want to make any medication changes.  Discussed diet changes.  Pt agrees.

## 2018-05-18 NOTE — Patient Instructions (Addendum)
Decrease lasix to 20mg  daily.  Spirolactone 1/2 tablet daily.   Needs appt with cardiology. Please make this.   Follow up Wednesday next week with BP recheck.    Diabetes Mellitus and Nutrition When you have diabetes (diabetes mellitus), it is very important to have healthy eating habits because your blood sugar (glucose) levels are greatly affected by what you eat and drink. Eating healthy foods in the appropriate amounts, at about the same times every day, can help you:  Control your blood glucose.  Lower your risk of heart disease.  Improve your blood pressure.  Reach or maintain a healthy weight.  Every person with diabetes is different, and each person has different needs for a meal plan. Your health care provider may recommend that you work with a diet and nutrition specialist (dietitian) to make a meal plan that is best for you. Your meal plan may vary depending on factors such as:  The calories you need.  The medicines you take.  Your weight.  Your blood glucose, blood pressure, and cholesterol levels.  Your activity level.  Other health conditions you have, such as heart or kidney disease.  How do carbohydrates affect me? Carbohydrates affect your blood glucose level more than any other type of food. Eating carbohydrates naturally increases the amount of glucose in your blood. Carbohydrate counting is a method for keeping track of how many carbohydrates you eat. Counting carbohydrates is important to keep your blood glucose at a healthy level, especially if you use insulin or take certain oral diabetes medicines. It is important to know how many carbohydrates you can safely have in each meal. This is different for every person. Your dietitian can help you calculate how many carbohydrates you should have at each meal and for snack. Foods that contain carbohydrates include:  Bread, cereal, rice, pasta, and crackers.  Potatoes and corn.  Peas, beans, and  lentils.  Milk and yogurt.  Fruit and juice.  Desserts, such as cakes, cookies, ice cream, and candy.  How does alcohol affect me? Alcohol can cause a sudden decrease in blood glucose (hypoglycemia), especially if you use insulin or take certain oral diabetes medicines. Hypoglycemia can be a life-threatening condition. Symptoms of hypoglycemia (sleepiness, dizziness, and confusion) are similar to symptoms of having too much alcohol. If your health care provider says that alcohol is safe for you, follow these guidelines:  Limit alcohol intake to no more than 1 drink per day for nonpregnant women and 2 drinks per day for men. One drink equals 12 oz of beer, 5 oz of wine, or 1 oz of hard liquor.  Do not drink on an empty stomach.  Keep yourself hydrated with water, diet soda, or unsweetened iced tea.  Keep in mind that regular soda, juice, and other mixers may contain a lot of sugar and must be counted as carbohydrates.  What are tips for following this plan? Reading food labels  Start by checking the serving size on the label. The amount of calories, carbohydrates, fats, and other nutrients listed on the label are based on one serving of the food. Many foods contain more than one serving per package.  Check the total grams (g) of carbohydrates in one serving. You can calculate the number of servings of carbohydrates in one serving by dividing the total carbohydrates by 15. For example, if a food has 30 g of total carbohydrates, it would be equal to 2 servings of carbohydrates.  Check the number of grams (g) of  saturated and trans fats in one serving. Choose foods that have low or no amount of these fats.  Check the number of milligrams (mg) of sodium in one serving. Most people should limit total sodium intake to less than 2,300 mg per day.  Always check the nutrition information of foods labeled as "low-fat" or "nonfat". These foods may be higher in added sugar or refined carbohydrates  and should be avoided.  Talk to your dietitian to identify your daily goals for nutrients listed on the label. Shopping  Avoid buying canned, premade, or processed foods. These foods tend to be high in fat, sodium, and added sugar.  Shop around the outside edge of the grocery store. This includes fresh fruits and vegetables, bulk grains, fresh meats, and fresh dairy. Cooking  Use low-heat cooking methods, such as baking, instead of high-heat cooking methods like deep frying.  Cook using healthy oils, such as olive, canola, or sunflower oil.  Avoid cooking with butter, cream, or high-fat meats. Meal planning  Eat meals and snacks regularly, preferably at the same times every day. Avoid going long periods of time without eating.  Eat foods high in fiber, such as fresh fruits, vegetables, beans, and whole grains. Talk to your dietitian about how many servings of carbohydrates you can eat at each meal.  Eat 4-6 ounces of lean protein each day, such as lean meat, chicken, fish, eggs, or tofu. 1 ounce is equal to 1 ounce of meat, chicken, or fish, 1 egg, or 1/4 cup of tofu.  Eat some foods each day that contain healthy fats, such as avocado, nuts, seeds, and fish. Lifestyle   Check your blood glucose regularly.  Exercise at least 30 minutes 5 or more days each week, or as told by your health care provider.  Take medicines as told by your health care provider.  Do not use any products that contain nicotine or tobacco, such as cigarettes and e-cigarettes. If you need help quitting, ask your health care provider.  Work with a Social worker or diabetes educator to identify strategies to manage stress and any emotional and social challenges. What are some questions to ask my health care provider?  Do I need to meet with a diabetes educator?  Do I need to meet with a dietitian?  What number can I call if I have questions?  When are the best times to check my blood glucose? Where to find  more information:  American Diabetes Association: diabetes.org/food-and-fitness/food  Academy of Nutrition and Dietetics: PokerClues.dk  [employer] of Diabetes and Digestive and Kidney Diseases (NIH): ContactWire.be Summary  A healthy meal plan will help you control your blood glucose and maintain a healthy lifestyle.  Working with a diet and nutrition specialist (dietitian) can help you make a meal plan that is best for you.  Keep in mind that carbohydrates and alcohol have immediate effects on your blood glucose levels. It is important to count carbohydrates and to use alcohol carefully. This information is not intended to replace advice given to you by your health care provider. Make sure you discuss any questions you have with your health care provider. Document Released: 03/12/2005 Document Revised: 07/20/2016 Document Reviewed: 07/20/2016 Elsevier Interactive Patient Education  [employer].

## 2018-05-23 ENCOUNTER — Ambulatory Visit (INDEPENDENT_AMBULATORY_CARE_PROVIDER_SITE_OTHER): Payer: Medicare Other | Admitting: Physician Assistant

## 2018-05-23 ENCOUNTER — Encounter: Payer: Self-pay | Admitting: Physician Assistant

## 2018-05-23 VITALS — BP 125/55 | HR 63 | Wt 242.0 lb

## 2018-05-23 DIAGNOSIS — I255 Ischemic cardiomyopathy: Secondary | ICD-10-CM | POA: Diagnosis not present

## 2018-05-23 DIAGNOSIS — I959 Hypotension, unspecified: Secondary | ICD-10-CM | POA: Insufficient documentation

## 2018-05-23 NOTE — Progress Notes (Signed)
Established Patient Office Visit  Subjective:  Patient ID: Earl Bhalla Sr., male    DOB: 1940-07-27  Age: 77 y.o. MRN: [id number]  CC:  Chief Complaint  Patient presents with  . Blood Pressure Check    HPI Earl Stick Sr. presents for blood pressure check. Denies chest pain, shortness of breath, dizziness or headaches. The last visit Earl White had low blood pressure. His furosemide and spironolactone was cut in half.     Past Medical History:  Diagnosis Date  . BPH (benign prostatic hyperplasia)   . CHF (congestive heart failure) (Earl White)   . Diabetes mellitus (Earl White)    Type II. Diet controlled  . Family history of breast cancer   . Family history of prostate cancer   . Gout   . Hyperlipidemia   . Hypertension   . Nonischemic cardiomyopathy ([place])    a. s/p STJ CRTD  . Paroxysmal atrial fibrillation ([place]) 12/25/15  . Prostate cancer [facility])     Past Surgical History:  Procedure Laterality Date  . CARDIAC DEFIBRILLATOR PLACEMENT  Jan 2014   SJM Quadra Assura implanted by Dr [name] in [place]    Family History  Problem Relation Age of Onset  . Diabetes Mother   . Hyperlipidemia Mother   . Heart attack Brother   . Breast cancer Sister 60  . Prostate cancer Maternal Uncle   . Prostate cancer Cousin        Earl White vet, agent orange exposure  . Prostate cancer Cousin        dx 28-30 yrs. - mat first cousin's son  . Prostate cancer Cousin   . Prostate cancer Cousin   . Diabetes Brother   . Throat cancer Brother 19       viet White vet, ? due to agent orange exposure    Social History   Socioeconomic History  . Marital status: Widowed    Spouse name: Not on file  . Number of children: 1  . Years of education: masters  . Highest education level: Master's degree (e.g., MA, MS, MEng, MEd, MSW, MBA)  Occupational History  . Occupation: retired    Comment: [employer]  Social Needs  . Financial resource strain: Not hard at all  . Food insecurity:   Worry: Never true    Inability: Never true  . Transportation needs:    Medical: No    Non-medical: No  Tobacco Use  . Smoking status: Never Smoker  . Smokeless tobacco: Never Used  Substance and Sexual Activity  . Alcohol use: Yes    Comment: Occasional  . Drug use: No  . Sexual activity: Yes  Lifestyle  . Physical activity:    Days per week: 3 days    Minutes per session: 10 min  . Stress: Not at all  Relationships  . Social connections:    Talks on phone: More than three times a week    Gets together: Three times a week    Attends religious service: Never    Active member of club or organization: No    Attends meetings of clubs or organizations: Never    Relationship status: Widowed  . Intimate partner violence:    Fear of current or ex partner: No    Emotionally abused: No    Physically abused: No    Forced sexual activity: No  Other Topics Concern  . Not on file  Social History Narrative   Previously lived in [place] before moving to [place].  He has recently relocated to [facility]. Still drives his Earl White all the time.    Outpatient Medications Prior to Visit  Medication Sig Dispense Refill  . aspirin 81 MG tablet Take 81 mg by mouth daily.    [name] atorvastatin (LIPITOR) 80 MG tablet Take 1 tablet (80 mg total) by mouth daily. 90 tablet 3  . carvedilol (COREG) 25 MG tablet Take 1 tablet (25 mg total) by mouth 2 (two) times daily with a meal. Needs follow up with PCP 60 tablet 0  . ENTRESTO 97-103 MG TAKE 1 TABLET BY MOUTH TWICE DAILY 180 tablet 3  . furosemide (LASIX) 40 MG tablet Take one half tablet daily. 30 tablet 2  . metFORMIN (GLUCOPHAGE-XR) 750 MG 24 hr tablet TAKE 1 TABLET BY MOUTH ONCE DAILY WITH BREAKFAST 90 tablet 0  . sennosides-docusate sodium (SENOKOT-S) 8.6-50 MG tablet Take 1 tablet by mouth daily as needed. For constipation    . sildenafil (VIAGRA) 100 MG tablet Take 1 tablet (100 mg total) by mouth as needed for erectile dysfunction. 10 tablet  5  . spironolactone (ALDACTONE) 25 MG tablet Take 0.5 tablets (12.5 mg total) by mouth daily. 30 tablet 6   No facility-administered medications prior to visit.     No Known Allergies  ROS Review of Systems    Objective:    Physical Exam  BP (!) 125/55   Pulse 63   Wt 242 lb (109.8 kg)   SpO2 92%   BMI 34.72 kg/m  Wt Readings from Last 3 Encounters:  05/23/18 242 lb (109.8 kg)  05/18/18 244 lb (110.7 kg)  05/17/18 238 lb (108 kg)     Health Maintenance Due  Topic Date Due  . OPHTHALMOLOGY EXAM  10/28/1950  . PNA vac Low Risk Adult (2 of 2 - PPSV23) 08/06/2016  . FOOT EXAM  03/05/2018  . URINE MICROALBUMIN  03/05/2018    There are no preventive care reminders to display for this patient.  No results found for: TSH Lab Results  Component Value Date   WBC 6.1 09/29/2017   HGB 16.0 09/29/2017   HCT 47.7 09/29/2017   MCV 87.7 09/29/2017   PLT 309 09/29/2017   Lab Results  Component Value Date   NA 140 09/29/2017   K 5.4 (H) 09/29/2017   CO2 28 09/29/2017   GLUCOSE 132 (H) 09/29/2017   BUN 20 09/29/2017   CREATININE 1.02 09/29/2017   BILITOT 0.7 09/29/2017   ALKPHOS 55 03/12/2015   AST 16 09/29/2017   ALT 14 09/29/2017   PROT 6.9 09/29/2017   ALBUMIN 4.2 03/12/2015   CALCIUM 9.7 09/29/2017   GFR 67.01 11/07/2014   Lab Results  Component Value Date   CHOL 172 09/29/2017   Lab Results  Component Value Date   HDL 38 (L) 09/29/2017   Lab Results  Component Value Date   LDLCALC 110 (H) 09/29/2017   Lab Results  Component Value Date   TRIG 129 09/29/2017   Lab Results  Component Value Date   CHOLHDL 4.5 09/29/2017   Lab Results  Component Value Date   HGBA1C 7.2 (A) 05/18/2018      Assessment & Plan:  Blood pressure check - blood pressure has come up from last visit. Continue current medications. Follow up in 1-2 days for O2 check.     Problem List Items Addressed This Visit    None      No orders of the defined types were placed  in this encounter.  Follow-up: No follow-ups on file.    Earl White, CMA   Agree with above plan. I love that BP is coming up and stable but need to evaluate why pulse ox is dropping.

## 2018-05-30 ENCOUNTER — Telehealth: Payer: Self-pay

## 2018-05-30 NOTE — Telephone Encounter (Signed)
Attempted to confirm remote transmission with pt. No answer and was unable to leave a message.   

## 2018-06-23 ENCOUNTER — Telehealth: Payer: Self-pay | Admitting: *Deleted

## 2018-06-23 ENCOUNTER — Ambulatory Visit (INDEPENDENT_AMBULATORY_CARE_PROVIDER_SITE_OTHER): Payer: Medicare Other

## 2018-06-23 DIAGNOSIS — Z95810 Presence of automatic (implantable) cardiac defibrillator: Secondary | ICD-10-CM

## 2018-06-23 DIAGNOSIS — I5022 Chronic systolic (congestive) heart failure: Secondary | ICD-10-CM

## 2018-06-23 DIAGNOSIS — I255 Ischemic cardiomyopathy: Secondary | ICD-10-CM

## 2018-06-23 NOTE — Progress Notes (Signed)
EPIC Encounter for ICM Monitoring  Patient Name: Earl Boyajian Sr. is a 77 y.o. male Date: 06/23/2018 Primary Care Physican: Donella Stade, PA-C Primary Cardiologist:[place] Electrophysiologist: Allred Bi-V Pacing: 98% Last Weight: 237lbs Today's Weight: 235 lbs  Since June 01, 2018 SVT Episodes: 3 VT episodes 2 No therapies delivered.                                              Heart failure questions reviewed.  He reported sudden episodes of shortness of breath for only a minute or two but this is unusual for him.  He had ankle swelling during decrease impedance in the last couple of days but has resolved.    Thoracic impedance has returned to normal per DirectTrend Viewer through 06/22/2018.  Message sent to device triage to review report due to SVT/VT episodes.   Prescribed: Furosemide 40 mg1tablet (40 mg total) daily.  Labs: 09/29/2017 Creatinine 1.02, BUN 20, Potassium 5.4, Sodium 140, EGFR 71-82  Recommendations:  No changes.  Advised to call if he experiences any fluid symptoms.  Follow-up plan: ICM clinic phone appointment on 07/25/2018.  Advised to call office and make an appt with Dr [name] (recall for 05/23/18).  Copy of ICM check sent to Dr. Rayann Heman and Dr [name].   DirectTrend Viewer through 06/22/2018.    3 month ICM trend: 06/19/2018    1 Year ICM trend:       Rosalene Billings, RN 06/23/2018 2:01 PM

## 2018-06-23 NOTE — Telephone Encounter (Signed)
-----   Message from Rosalene Billings, RN sent at 06/23/2018  2:08 PM EST ----- Regarding: SVT/VT Hi, Can you review the 12/22 report? The report says he had 2 episodes of VT and 4 episodes of SVT.  No therapies were delivered. It is patient of Dr Jackalyn Lombard.  Patient c/o occasional shortness of breath that comes on suddenly but only last a minute or two.  He said this is unusual for him.  Advised him to make an appt with Dr [name].  Do these episodes need to be addressed further?    Thanks, Margarita Grizzle

## 2018-06-23 NOTE — Progress Notes (Signed)
Remote ICD transmission.   

## 2018-06-23 NOTE — Telephone Encounter (Signed)
Device counters show 1 non-sustained episode, 3 SVT episodes, and 2 VT episodes (in monitor zone), all from 06/18/18. EGMs suggest 1:1 AT and AF with high V rates, 157-181bpm. All episodes occurred between 1253-1317. Episodes are similar to previous episodes from 12/20/2015 device check (see scanned media). Available AMS episode EGM from 12/21 at 2127 shows atrial lead noise (known).  Reviewed with Dr. Caryl Comes. Per previous remote transmissions, sub-clinical A-fib has been documented. Plan to defer [place] and rate control plan to Dr. Rayann Heman. Alert placed in Dr. Jackalyn Lombard red folder for review.

## 2018-06-24 NOTE — Progress Notes (Signed)
Received following message from Levander Campion, device RN.    Device counters show 1 non-sustained episode, 3 SVT episodes, and 2 VT episodes (in monitor zone), all from 06/18/18. EGMs suggest 1:1 AT and AF with high V rates, 157-181bpm. All episodes occurred between 1253-1317. Episodes are similar to previous episodes from 12/20/2015 device check (see scanned media). Available AMS episode EGM from 12/21 at 2127 shows atrial lead noise (known).  Reviewed with Dr. Caryl Comes. Per previous remote transmissions, sub-clinical A-fib has been documented. Plan to defer [place] and rate control plan to Dr. Rayann Heman. Alert placed in Dr. Jackalyn Lombard red folder for review.

## 2018-06-25 NOTE — Progress Notes (Signed)
Pt with paf; DC ASA; apixaban 5 mg BID; bmet CBC 4 weeks Kirk Ruths

## 2018-06-26 LAB — CUP PACEART REMOTE DEVICE CHECK
Battery Voltage: 2.86 V
Brady Statistic AP VP Percent: 88 %
Brady Statistic AS VP Percent: 10 %
Brady Statistic AS VS Percent: 1 %
Brady Statistic RA Percent Paced: 88 %
HIGH POWER IMPEDANCE MEASURED VALUE: 43 Ohm
HighPow Impedance: 43 Ohm
Implantable Lead Implant Date: 20140117
Implantable Lead Implant Date: 20140117
Implantable Lead Implant Date: 20140117
Implantable Lead Location: 753858
Implantable Lead Location: 753859
Implantable Lead Location: 753860
Lead Channel Impedance Value: 930 Ohm
Lead Channel Pacing Threshold Amplitude: 0.875 V
Lead Channel Pacing Threshold Amplitude: 1 V
Lead Channel Pacing Threshold Pulse Width: 0.5 ms
Lead Channel Pacing Threshold Pulse Width: 0.5 ms
Lead Channel Sensing Intrinsic Amplitude: 9.7 mV
Lead Channel Setting Pacing Amplitude: 1.625
Lead Channel Setting Pacing Amplitude: 2 V
Lead Channel Setting Sensing Sensitivity: 0.5 mV
MDC IDC MSMT BATTERY REMAINING LONGEVITY: 28 mo
MDC IDC MSMT BATTERY REMAINING PERCENTAGE: 29 %
MDC IDC MSMT LEADCHNL RA IMPEDANCE VALUE: 480 Ohm
MDC IDC MSMT LEADCHNL RA PACING THRESHOLD AMPLITUDE: 0.625 V
MDC IDC MSMT LEADCHNL RA PACING THRESHOLD PULSEWIDTH: 0.5 ms
MDC IDC MSMT LEADCHNL RA SENSING INTR AMPL: 5 mV
MDC IDC MSMT LEADCHNL RV IMPEDANCE VALUE: 450 Ohm
MDC IDC PG IMPLANT DT: 20140117
MDC IDC SESS DTM: 20191222094921
MDC IDC SET LEADCHNL LV PACING PULSEWIDTH: 0.5 ms
MDC IDC SET LEADCHNL RV PACING AMPLITUDE: 2 V
MDC IDC SET LEADCHNL RV PACING PULSEWIDTH: 0.5 ms
MDC IDC STAT BRADY AP VS PERCENT: 1.4 %
Pulse Gen Serial Number: 7070427

## 2018-06-27 ENCOUNTER — Telehealth: Payer: Self-pay | Admitting: *Deleted

## 2018-06-27 NOTE — Telephone Encounter (Signed)
Unable to reach pt or leave a message on home number.

## 2018-06-27 NOTE — Telephone Encounter (Addendum)
-----   Message from Lelon Perla, MD sent at 06/25/2018  1:10 PM EST ----- Pt with paf; DC ASA; apixaban 5 mg BID; bmet CBC 4 weeks Kirk Ruths  ----- Message ----- From: Rosalene Billings, RN Sent: 06/23/2018   2:32 PM EST To: Lelon Perla, MD

## 2018-06-28 NOTE — Telephone Encounter (Signed)
Left message for pt to call on mobile number

## 2018-07-01 MED ORDER — APIXABAN 5 MG PO TABS: 5.0000 mg | ORAL_TABLET | Freq: Two times a day (BID) | ORAL | 6 refills | Status: DC

## 2018-07-01 NOTE — Telephone Encounter (Signed)
Spoke with pt, Aware of dr Jacalyn Lefevre recommendations. He reports he has felt bad for the last 3-4 days and his heart is racing when he is up moving around. New script sent to the pharmacy and Follow up scheduled with APP.

## 2018-07-02 ENCOUNTER — Other Ambulatory Visit: Payer: Self-pay | Admitting: Physician Assistant

## 2018-07-02 DIAGNOSIS — I420 Dilated cardiomyopathy: Secondary | ICD-10-CM

## 2018-07-02 DIAGNOSIS — I5023 Acute on chronic systolic (congestive) heart failure: Secondary | ICD-10-CM

## 2018-07-02 DIAGNOSIS — I10 Essential (primary) hypertension: Secondary | ICD-10-CM

## 2018-07-02 NOTE — Progress Notes (Signed)
Cardiology Office Note   Date:  07/04/2018   ID:  Earl Hatcher Sr., DOB 05/29/1941, MRN [id number]  PCP:  Earl Stade, PA-C  Cardiologist:  Earl White Ser)  Chief Complaint  Patient presents with  . Atrial Fibrillation    Rapid HR C/O FATIGUE AND DOE WHEN CLIMBING STAIRS AND JUST WALKING     History of Present Illness: Earl Moure Sr. is a 78 y.o. male who presents for ongoing assessment and management of PAF, who called our office on 06/23/2018 with complaints of heart racing. He also has a history of CHF, ICD in situ, and HTN.  He was to stop ASA, and was to have follow up BMET and CBC. Labs were reviewed, he was found to have a potassium of 5.4, Creatinine 1.02 He was not found to be anemic. Last attempt at remote ICD transmission 06/23/2018  He has an ICD in situ, with most recent transmissio rhythm ApBiV paced. Normal device (battery/leads) function noted. Diagnostic data shows 2 episodes falling into VT-Monitor zone, both being on 06/18/18. Episode log shows on this day, between 1253-1316, he had also 3 "SVT" episodes that will coincide with these episodes. EGM's show the 2 VT episodes appear the same in presentation. They initially show AF with controlled rates, then suddenly begin to conduct (AFlutter) rapidly (1:1) with rates 169-185bpm falling into  VT-monitor zone, then rate slows 130-150's (SVT episodes) Longest episode 36min. 761 AMS episodes since 01/11/18. EGM's available show short bursts of atrial oversensing consistent with atrial noise and 1 atrial noise revision episode logged on 06/02/18. Appears known per Epic records as seen by EP on 06/23/18. BiV pacing 98%.   He has run out of his coreg 25 mg BID for the last 3 days. He has also been told by PCP, to reduced his lasix to 20 mg daily as his BP was too low. He denies bleeding. He states he is feeling much more tired lately and has more DOE when he walks on flat surfaces. He sleeps better in a recliner  instead of lying in the bed because he feels more comfortable. He denies chest pain, but has more LEE. He states his normal weight is 238 lbs. He admits to dietary non-compliance over the holidays.    Past Medical History:  Diagnosis Date  . BPH (benign prostatic hyperplasia)   . CHF (congestive heart failure) (Earl White)   . Diabetes mellitus (Earl White)    Type II. Diet controlled  . Family history of breast cancer   . Family history of prostate cancer   . Gout   . Hyperlipidemia   . Hypertension   . Nonischemic cardiomyopathy ([place])    a. s/p STJ CRTD  . Paroxysmal atrial fibrillation (Earl White) 12/25/15  . Prostate cancer Earl White)     Past Surgical History:  Procedure Laterality Date  . CARDIAC DEFIBRILLATOR PLACEMENT  Jan 2014   SJM Quadra Assura implanted by Dr [name] in [place]     Current Outpatient Medications  Medication Sig Dispense Refill  . apixaban (ELIQUIS) 5 MG TABS tablet Take 1 tablet (5 mg total) by mouth 2 (two) times daily. 60 tablet 6  . atorvastatin (LIPITOR) 80 MG tablet Take 1 tablet (80 mg total) by mouth daily. 90 tablet 3  . carvedilol (COREG) 25 MG tablet Take 1 tablet (25 mg total) by mouth 2 (two) times daily with a meal. Appointment is needed for refills. 60 tablet 2  . ENTRESTO 97-103 MG TAKE 1 TABLET BY MOUTH  TWICE DAILY 180 tablet 3  . furosemide (LASIX) 40 MG tablet Take 1 tablet (40 mg total) by mouth daily. 60 tablet 2  . metFORMIN (GLUCOPHAGE-XR) 750 MG 24 hr tablet TAKE 1 TABLET BY MOUTH ONCE DAILY WITH BREAKFAST 90 tablet 0  . sennosides-docusate sodium (SENOKOT-S) 8.6-50 MG tablet Take 1 tablet by mouth daily as needed. For constipation    . sildenafil (VIAGRA) 100 MG tablet Take 1 tablet (100 mg total) by mouth as needed for erectile dysfunction. 10 tablet 5  . spironolactone (ALDACTONE) 25 MG tablet Take 0.5 tablets (12.5 mg total) by mouth daily. 30 tablet 6   No current facility-administered medications for this visit.     Allergies:    Patient has no known allergies.    Social History:  The patient  reports that he has never smoked. He has never used smokeless tobacco. He reports current alcohol use. He reports that he does not use drugs.   Family History:  The patient's family history includes Breast cancer (age of onset: 59) in his sister; Diabetes in his brother and mother; Heart attack in his brother; Hyperlipidemia in his mother; Prostate cancer in his cousin, cousin, cousin, cousin, and maternal uncle; Throat cancer (age of onset: 60) in his brother.    ROS: All other systems are reviewed and negative. Unless otherwise mentioned in H&P    PHYSICAL EXAM: VS:  BP 132/74 (BP Location: Left Arm)   Pulse 60   Ht 5\' 10"  (1.778 m)   Wt 246 lb (111.6 kg)   BMI 35.30 kg/m  , BMI Body mass index is 35.3 kg/m. GEN: Well nourished, well developed, in no acute distress HEENT: normal Neck: no JVD, carotid bruits, or masses Cardiac: RRR occasional extra systole; no murmurs, rubs, or gallops, 1+-2+ bilateral pretibial edema  Respiratory:  Clear to auscultation bilaterally, normal work of breathing GI: soft, nontender, nondistended, + BS MS: no deformity or atrophy Skin: warm and dry, no rash Neuro:  Strength and sensation are intact Psych: euthymic mood, full affect   EKG:AV Pacing rate of 60 bpm.    Recent Labs: 09/29/2017: ALT 14; BUN 20; Creat 1.02; Hemoglobin 16.0; Platelets 309; Potassium 5.4; Sodium 140    Lipid Panel    Component Value Date/Time   CHOL 172 09/29/2017 1612   TRIG 129 09/29/2017 1612   HDL 38 (L) 09/29/2017 1612   CHOLHDL 4.5 09/29/2017 1612   VLDL 20 03/12/2015 1200   LDLCALC 110 (H) 09/29/2017 1612      Wt Readings from Last 3 Encounters:  07/04/18 246 lb (111.6 kg)  05/23/18 242 lb (109.8 kg)  05/18/18 244 lb (110.7 kg)      Other studies Reviewed: Echocardiogram October 17, 2017  Left ventricle: The cavity size was moderately dilated. Wall thickness was increased in a pattern of  mild LVH. Inferior and Inferolateral akinesis. Anterolateral severe hypokinesis. Systolic function was severely reduced. The estimated ejection fraction was in the range of 25% to 30%. Doppler parameters are consistent with abnormal left ventricular relaxation (grade 1 diastolic dysfunction). - Aortic valve: Trileaflet; moderately calcified leaflets. Not fully interrogated by doppler. - Mitral valve: Mildly calcified annulus. There was no significant regurgitation. - Left atrium: The atrium was mildly dilated. - Right ventricle: The cavity size was normal. Pacer wire or catheter noted in right ventricle. Systolic function was mildly reduced. - Pulmonary arteries: No complete TR doppler jet so unable to estimate PA systolic pressure. - Inferior vena cava: The vessel was normal in size.  The respirophasic diameter changes were in the normal range (>= 50%), consistent with normal central venous pressure.  ASSESSMENT AND PLAN:  1. NICM: Most recent echocardiogram with  10/08/2017 EF of 25%-30%. He is given refills on coreg 25 mg BID. He is also to go back up on the lasix to 40 mg daily instead of 20 mg to avoid fluid overload. He has mild overload this am.    2.  Hypertension: BP is not optimal for current EF. He will go back on the coreg, and lasix as above and to help with BP control and continue Entresto as directed.   3. ICD in situ: It is noted that he has had episodes of between 1253-1316, he had also 3 "SVT" episodes that will coincide with these episodes. EGM's show the 2 VT episodes appear the same in presentation. Concern that his symptoms are related to rapid HR. He states that he has been taking coreg. If this persists, would have him see EP for medication adjustment.   3. Hypercholesterolemia: Continue atorvastatin.   Current medicines are reviewed at length with the patient today.  He wishes to be followed by Dr. [name] only, in [place]. He his to  have next available appt in [place] for follow up.   Labs/ tests ordered today include: None.   Phill Myron. West Pugh, ANP, AACC   07/04/2018 10:15 AM    [place] [address] Office [phone] Fax [phone]

## 2018-07-04 ENCOUNTER — Encounter: Payer: Self-pay | Admitting: Adult Health

## 2018-07-04 ENCOUNTER — Ambulatory Visit (INDEPENDENT_AMBULATORY_CARE_PROVIDER_SITE_OTHER): Payer: Medicare Other | Admitting: Adult Health

## 2018-07-04 VITALS — BP 132/74 | HR 60 | Ht 70.0 in | Wt 246.0 lb

## 2018-07-04 DIAGNOSIS — Z95810 Presence of automatic (implantable) cardiac defibrillator: Secondary | ICD-10-CM

## 2018-07-04 DIAGNOSIS — I10 Essential (primary) hypertension: Secondary | ICD-10-CM

## 2018-07-04 DIAGNOSIS — I5023 Acute on chronic systolic (congestive) heart failure: Secondary | ICD-10-CM

## 2018-07-04 DIAGNOSIS — I420 Dilated cardiomyopathy: Secondary | ICD-10-CM | POA: Diagnosis not present

## 2018-07-04 DIAGNOSIS — I480 Paroxysmal atrial fibrillation: Secondary | ICD-10-CM

## 2018-07-04 MED ORDER — CARVEDILOL 25 MG PO TABS: 25.0000 mg | ORAL_TABLET | Freq: Two times a day (BID) | ORAL | 2 refills | Status: DC

## 2018-07-04 MED ORDER — FUROSEMIDE 40 MG PO TABS: 40.0000 mg | ORAL_TABLET | Freq: Every day | ORAL | 2 refills | Status: DC

## 2018-07-04 NOTE — Patient Instructions (Signed)
Medication Instructions:  INCREASE LASIX 40MG  DAILY  If you need a refill on your cardiac medications before your next appointment, please call your pharmacy.  Labwork: NONE ORDERED Take the provided lab slips with you to the lab for your blood draw.  When you have your labs (blood work) drawn today and your tests are completely normal, you will receive your results only by MyChart Message (if you have MyChart) -OR-  A paper copy in the mail.  If you have any lab test that is abnormal or we need to change your treatment, we will call you to review these results.  Follow-Up: You will need a follow up appointment in [place]. You may see Kirk Ruths, MD Jory Sims, DNP, AACC or one of the following Advanced Practice Providers on your designated Care Team:  Kerin Ransom, [place]   Roby Lofts, PA-C  At [facility], you and your health needs are our priority.  As part of our continuing mission to provide you with exceptional heart care, we have created designated Provider Care Teams.  These Care Teams include your primary Cardiologist (physician) and Advanced Practice Providers (APPs -  Physician Assistants and Nurse Practitioners) who all work together to provide you with the care you need, when you need it.  Thank you for choosing [facility]!!

## 2018-07-05 ENCOUNTER — Other Ambulatory Visit: Payer: Self-pay | Admitting: Internal Medicine

## 2018-07-11 NOTE — Progress Notes (Signed)
HPI: FU congestive heart failure. Patient previously resided in [place] and then [place]. He apparently was diagnosed with a cardiomyopathy. He does not know the etiology and denies catheterization. He has had previous CRT-D. He moved here in March of 2014. Last echocardiogram April 2016 showed ejection fraction 25-30%. There was grade 1 diastolic dysfunction. Left atrium is mildly dilated. Patient agreed to nuclear study April 2017. This showed ejection fraction 24% There was a large inferior lateral scar with minimal peri-infarct ischemia.Patient did not want to pursue cardiac catheterization.Recently had device interrogated and found to have 3 episodes of SVT and 2 VT episodes.  There was note of atrial tachycardia and atrial fibrillation.  He was seen in the office and patient had run out of his carvedilol.  Since last seen, he notes increased fatigue and mild increased dyspnea on exertion.  No orthopnea, PND, pedal edema or syncope.  Patient did state that he had "indigestion" several weeks ago while driving to [place].  No recent exertional chest pain.  Current Outpatient Medications  Medication Sig Dispense Refill  . apixaban (ELIQUIS) 5 MG TABS tablet Take 1 tablet (5 mg total) by mouth 2 (two) times daily. 60 tablet 6  . atorvastatin (LIPITOR) 80 MG tablet Take 1 tablet (80 mg total) by mouth daily. 90 tablet 3  . carvedilol (COREG) 25 MG tablet Take 1 tablet (25 mg total) by mouth 2 (two) times daily with a meal. Appointment is needed for refills. 60 tablet 2  . ENTRESTO 97-103 MG TAKE 1 TABLET BY MOUTH TWICE DAILY 180 tablet 3  . furosemide (LASIX) 40 MG tablet Take 1 tablet (40 mg total) by mouth daily. 60 tablet 2  . metFORMIN (GLUCOPHAGE-XR) 750 MG 24 hr tablet TAKE 1 TABLET BY MOUTH ONCE DAILY WITH BREAKFAST 90 tablet 0  . sennosides-docusate sodium (SENOKOT-S) 8.6-50 MG tablet Take 1 tablet by mouth daily as needed. For constipation    . sildenafil (VIAGRA) 100  MG tablet Take 1 tablet (100 mg total) by mouth as needed for erectile dysfunction. 10 tablet 5  . spironolactone (ALDACTONE) 25 MG tablet Take 0.5 tablets (12.5 mg total) by mouth daily. 30 tablet 6   No current facility-administered medications for this visit.      Past Medical History:  Diagnosis Date  . BPH (benign prostatic hyperplasia)   . CHF (congestive heart failure) ([place])   . Diabetes mellitus ([place])    Type II. Diet controlled  . Family history of breast cancer   . Family history of prostate cancer   . Gout   . Hyperlipidemia   . Hypertension   . Nonischemic cardiomyopathy ([place])    a. s/p STJ CRTD  . Paroxysmal atrial fibrillation ([place]) 12/25/15  . Prostate cancer [facility])     Past Surgical History:  Procedure Laterality Date  . CARDIAC DEFIBRILLATOR PLACEMENT  Jan 2014   SJM Quadra Assura implanted by Dr [name] in [place] History   Socioeconomic History  . Marital status: Widowed    Spouse name: Not on file  . Number of children: 1  . Years of education: masters  . Highest education level: Master's degree (e.g., MA, MS, MEng, MEd, MSW, MBA)  Occupational History  . Occupation: retired    Comment: [employer]  Social Needs  . Financial resource strain: Not hard at all  . Food insecurity:    Worry: Never true    Inability: Never true  . Transportation  needs:    Medical: No    Non-medical: No  Tobacco Use  . Smoking status: Never Smoker  . Smokeless tobacco: Never Used  Substance and Sexual Activity  . Alcohol use: Yes    Comment: Occasional  . Drug use: No  . Sexual activity: Yes  Lifestyle  . Physical activity:    Days per week: 3 days    Minutes per session: 10 min  . Stress: Not at all  Relationships  . Social connections:    Talks on phone: More than three times a week    Gets together: Three times a week    Attends religious service: Never    Active member of club or organization: No    Attends meetings of clubs or  organizations: Never    Relationship status: Widowed  . Intimate partner violence:    Fear of current or ex partner: No    Emotionally abused: No    Physically abused: No    Forced sexual activity: No  Other Topics Concern  . Not on file  Social History Narrative   Previously lived in [place] before moving to [place].  He has recently relocated to [facility]. 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    Family History  Problem Relation Age of Onset  . Diabetes Mother   . Hyperlipidemia Mother   . Heart attack Brother   . Breast cancer Sister 44  . Prostate cancer Maternal Uncle   . Prostate cancer Cousin        [name] vet, agent orange exposure  . Prostate cancer Cousin        dx 28-30 yrs. - mat first cousin's son  . Prostate cancer Cousin   . Prostate cancer Cousin   . Diabetes Brother   . Throat cancer Brother 60       [place] vet, ? due to agent orange exposure    ROS: Fatigue and some leg pain but no fevers or chills, productive cough, hemoptysis, dysphasia, odynophagia, melena, hematochezia, dysuria, hematuria, rash, seizure activity, orthopnea, PND, pedal edema, claudication. Remaining systems are negative.  Physical Exam: Well-developed well-nourished in no acute distress.  Skin is warm and dry.  HEENT is normal.  Neck is supple.  Chest is clear to auscultation with normal expansion.  Cardiovascular exam is regular rate and rhythm.  Abdominal exam nontender or distended. No masses palpated. Extremities show no edema. neuro grossly intact  ECG-AV paced; personally reviewed  A/P  1 paroxysmal atrial fibrillation-diagnosed by recent interrogation of device.  He is in sinus rhythm today.  Continue carvedilol for rate control if atrial fibrillation recurs.  Continue apixaban.  Check hemoglobin and renal function.  2 presumed coronary artery disease-based on previous nuclear study with infarct but no ischemia.  He declined cardiac catheterization in the past.   However he now has worsening fatigue and mild dyspnea on exertion.  He would be willing to proceed with catheterization to exclude coronary disease as a cause of his cardiomyopathy (question if revascularization would improve LV function).  The risks and benefits including myocardial infarction, CVA and death discussed and he agrees to proceed.  Hold apixaban 2 days prior to procedure and resume the day after.  Hold Lasix and spironolactone the day of procedure.  Continue statin.  Will need to initiate aspirin at time of catheterization.  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  4 chronic systolic congestive heart failure-patient describes some increased fatigue and dyspnea on exertion.  Question  if worsening LV function is contributing.  Plan cardiac catheterization as outlined above as he is now agreeable.  We will plan to repeat echocardiogram.  He appears to be euvolemic.  Continue Lasix and Spironolactone.  Check potassium and renal function. Will also check TSH.  5 hypertension-patient's blood pressure is controlled.  Continue present medications and follow.  6 prior ICD-followed by electrophysiology.  7 hyperlipidemia-continue statin.  Check lipids and liver.  Note he is describing some muscle pain.  If this worsens we will consider trying a different statin.  Kirk Ruths, MD

## 2018-07-13 ENCOUNTER — Encounter: Payer: Self-pay | Admitting: Cardiology

## 2018-07-13 ENCOUNTER — Ambulatory Visit (INDEPENDENT_AMBULATORY_CARE_PROVIDER_SITE_OTHER): Payer: Medicare Other | Admitting: Cardiology

## 2018-07-13 VITALS — BP 114/60 | HR 60 | Ht 70.0 in | Wt 243.4 lb

## 2018-07-13 DIAGNOSIS — I5022 Chronic systolic (congestive) heart failure: Secondary | ICD-10-CM | POA: Diagnosis not present

## 2018-07-13 DIAGNOSIS — I480 Paroxysmal atrial fibrillation: Secondary | ICD-10-CM

## 2018-07-13 DIAGNOSIS — I10 Essential (primary) hypertension: Secondary | ICD-10-CM

## 2018-07-13 DIAGNOSIS — E781 Pure hyperglyceridemia: Secondary | ICD-10-CM

## 2018-07-13 DIAGNOSIS — I2510 Atherosclerotic heart disease of native coronary artery without angina pectoris: Secondary | ICD-10-CM | POA: Diagnosis not present

## 2018-07-13 DIAGNOSIS — E7800 Pure hypercholesterolemia, unspecified: Secondary | ICD-10-CM

## 2018-07-13 DIAGNOSIS — I255 Ischemic cardiomyopathy: Secondary | ICD-10-CM | POA: Diagnosis not present

## 2018-07-13 LAB — CBC
HEMATOCRIT: 46.5 % (ref 38.5–50.0)
Hemoglobin: 15.9 g/dL (ref 13.2–17.1)
MCH: 30.7 pg (ref 27.0–33.0)
MCHC: 34.2 g/dL (ref 32.0–36.0)
MCV: 89.8 fL (ref 80.0–100.0)
MPV: 10.9 fL (ref 7.5–12.5)
Platelets: 253 10*3/uL (ref 140–400)
RBC: 5.18 10*6/uL (ref 4.20–5.80)
RDW: 14.3 % (ref 11.0–15.0)
WBC: 5.9 10*3/uL (ref 3.8–10.8)

## 2018-07-13 LAB — BASIC METABOLIC PANEL
BUN/Creatinine Ratio: 18 (calc) (ref 6–22)
BUN: 24 mg/dL (ref 7–25)
CO2: 31 mmol/L (ref 20–32)
Calcium: 9.9 mg/dL (ref 8.6–10.3)
Chloride: 105 mmol/L (ref 98–110)
Creat: 1.37 mg/dL — ABNORMAL HIGH (ref 0.70–1.18)
Glucose, Bld: 106 mg/dL — ABNORMAL HIGH (ref 65–99)
Potassium: 4.8 mmol/L (ref 3.5–5.3)
SODIUM: 142 mmol/L (ref 135–146)

## 2018-07-13 LAB — TSH: TSH: 1.83 mIU/L (ref 0.40–4.50)

## 2018-07-13 NOTE — Patient Instructions (Signed)
    [place] [facility] [address] [place] [address] Dept: [phone] Loc: [phone]  Earl Rabbani Sr.  07/13/2018  You are scheduled for a Cardiac Catheterization on Wednesday, January 22 with Dr. Harrell Gave End.  1. Please arrive at the [facility] (Main Entrance A) at [facility]: [place], [place] [address] at 11:30 AM (This time is two hours before your procedure to ensure your preparation). Free valet parking service is available.   Special note: Every effort is made to have your procedure done on time. Please understand that emergencies sometimes delay scheduled procedures.  2. Diet: Do not eat solid foods after midnight.  The patient may have clear liquids until 5am upon the day of the procedure.  3. Labs: You will need to have blood drawn on TODAY  4. Medication instructions in preparation for your procedure:  TAKE THE LAST DOSE OF ELIQUIS Sunday AND RESTART ON Thursday  DO NOT TAKE FUROSEMIDE OR SPIRONOLACTONE Wednesday MORNING  On the morning of your procedure, take your  morning medicines NOT listed above.  You may use sips of water.  5. Plan for one night stay--bring personal belongings. 6. Bring a current list of your medications and current insurance cards. 7. You MUST have a responsible person to drive you home. 8. Someone MUST be with you the first 24 hours after you arrive home or your discharge will be delayed. 9. Please wear clothes that are easy to get on and off and wear slip-on shoes.  Thank you for allowing [place] to care for you!   -- [facility]   Your physician recommends that you schedule a follow-up appointment in: [place]

## 2018-07-14 ENCOUNTER — Telehealth: Payer: Self-pay | Admitting: *Deleted

## 2018-07-14 ENCOUNTER — Other Ambulatory Visit: Payer: Self-pay | Admitting: *Deleted

## 2018-07-14 DIAGNOSIS — I5022 Chronic systolic (congestive) heart failure: Secondary | ICD-10-CM

## 2018-07-14 NOTE — Telephone Encounter (Addendum)
Spoke with pt, he voiced understanding of medication change. Lab orders mailed to the pt    ----- Message from Lelon Perla, MD sent at 07/14/2018  7:25 AM EST ----- Hold lasix and spironolactone the day before and day of cath; resume day after; bmet 3 days after cath; hold glucophage for 48 hours following cath [facility]

## 2018-07-18 ENCOUNTER — Telehealth: Payer: Self-pay | Admitting: Cardiology

## 2018-07-18 NOTE — Telephone Encounter (Signed)
Will forward for dr [name] review, ? What other testing is needed.

## 2018-07-18 NOTE — Telephone Encounter (Signed)
° °  Patient calling to cancel 1/22 procedure with Dr End, stating he wants to do more testing before having a heart cath

## 2018-07-19 NOTE — Telephone Encounter (Signed)
Left message for patient, cath has been canceled and to follow up as scheduled.

## 2018-07-20 ENCOUNTER — Ambulatory Visit (HOSPITAL_COMMUNITY): Admission: RE | Admit: 2018-07-20 | Payer: Medicare Other | Source: Home / Self Care | Admitting: Internal Medicine

## 2018-07-20 ENCOUNTER — Encounter (HOSPITAL_COMMUNITY): Admission: RE | Payer: Self-pay | Source: Home / Self Care

## 2018-07-20 SURGERY — RIGHT/LEFT HEART CATH AND CORONARY ANGIOGRAPHY
Anesthesia: LOCAL

## 2018-07-25 ENCOUNTER — Ambulatory Visit (INDEPENDENT_AMBULATORY_CARE_PROVIDER_SITE_OTHER): Payer: Medicare Other

## 2018-07-25 DIAGNOSIS — Z95810 Presence of automatic (implantable) cardiac defibrillator: Secondary | ICD-10-CM

## 2018-07-25 DIAGNOSIS — I5022 Chronic systolic (congestive) heart failure: Secondary | ICD-10-CM

## 2018-07-27 ENCOUNTER — Encounter: Payer: Self-pay | Admitting: Physician Assistant

## 2018-07-27 ENCOUNTER — Ambulatory Visit (INDEPENDENT_AMBULATORY_CARE_PROVIDER_SITE_OTHER): Payer: Medicare Other | Admitting: Physician Assistant

## 2018-07-27 VITALS — BP 126/60 | HR 59 | Wt 246.0 lb

## 2018-07-27 DIAGNOSIS — Z8739 Personal history of other diseases of the musculoskeletal system and connective tissue: Secondary | ICD-10-CM

## 2018-07-27 DIAGNOSIS — Z79899 Other long term (current) drug therapy: Secondary | ICD-10-CM

## 2018-07-27 DIAGNOSIS — I420 Dilated cardiomyopathy: Secondary | ICD-10-CM | POA: Diagnosis not present

## 2018-07-27 DIAGNOSIS — I5023 Acute on chronic systolic (congestive) heart failure: Secondary | ICD-10-CM

## 2018-07-27 DIAGNOSIS — I10 Essential (primary) hypertension: Secondary | ICD-10-CM

## 2018-07-27 DIAGNOSIS — I480 Paroxysmal atrial fibrillation: Secondary | ICD-10-CM

## 2018-07-27 DIAGNOSIS — I255 Ischemic cardiomyopathy: Secondary | ICD-10-CM

## 2018-07-27 DIAGNOSIS — E1122 Type 2 diabetes mellitus with diabetic chronic kidney disease: Secondary | ICD-10-CM | POA: Diagnosis not present

## 2018-07-27 LAB — POCT UA - MICROALBUMIN
Creatinine, POC: 100 mg/dL
Microalbumin Ur, POC: 10 mg/L

## 2018-07-27 MED ORDER — SPIRONOLACTONE 25 MG PO TABS: 12.5000 mg | ORAL_TABLET | Freq: Every day | ORAL | 6 refills | Status: DC

## 2018-07-28 LAB — BASIC METABOLIC PANEL WITH GFR
BUN/Creatinine Ratio: 22 (calc) (ref 6–22)
BUN: 28 mg/dL — ABNORMAL HIGH (ref 7–25)
CO2: 29 mmol/L (ref 20–32)
Calcium: 9.7 mg/dL (ref 8.6–10.3)
Chloride: 107 mmol/L (ref 98–110)
Creat: 1.3 mg/dL — ABNORMAL HIGH (ref 0.70–1.18)
GFR, Est African American: 61 mL/min/{1.73_m2} (ref >=60)
GFR, Est Non African American: 53 mL/min/{1.73_m2} — ABNORMAL LOW (ref >=60)
Glucose, Bld: 115 mg/dL — ABNORMAL HIGH (ref 65–99)
Potassium: 4.5 mmol/L (ref 3.5–5.3)
Sodium: 143 mmol/L (ref 135–146)

## 2018-07-28 LAB — URIC ACID: Uric Acid, Serum: 7.4 mg/dL (ref 4.0–8.0)

## 2018-07-28 NOTE — Progress Notes (Signed)
EPIC Encounter for ICM Monitoring  Patient Name: Earl Cumba Sr. is a 78 y.o. male Date: 07/28/2018 Primary Care Physican: Lavada Mesi Primary Cardiologist:[place] Electrophysiologist: Allred Bi-V Pacing: 98% Last Weight: 235lbs Today's Weight: 235 lbs    Heart Failure questions reviewed.  Small amount swelling in right ankle earlier this week but took extra half of Lasix and resolved.  Report: Thoracic impedance normal  Prescribed: Furosemide 40 mg1tablet (40 mg total) daily.  Labs: 07/27/2018 Creatinine 1.30, BUN 28, Potassium 4.5, Sodium 143 07/13/2018 Creatinine 1.37, BUN 24, Potassium 4.8, Sodium 142 09/29/2017 Creatinine 1.02, BUN 20, Potassium 5.4, Sodium 140, EGFR 71-82  Recommendations: No changes.    Encouraged to call for fluid symptoms.  Follow-up plan: ICM clinic phone appointment on 08/29/2018.   Office appointment scheduled 08/10/2018 with Dr. [name].    Copy of ICM check sent to Dr. Rayann Heman.   3 month ICM trend: 07/25/2018    1 Year ICM trend:       Rosalene Billings, RN 07/28/2018 4:09 PM

## 2018-07-29 NOTE — Progress Notes (Signed)
Uric acid is in normal range. If not taking alloupurinol does not have to restart if not having gout flares.   Potassium and sodium are good.   Kidney function improved from last recheck and stable.

## 2018-08-01 ENCOUNTER — Encounter: Payer: Self-pay | Admitting: Physician Assistant

## 2018-08-01 DIAGNOSIS — Z8739 Personal history of other diseases of the musculoskeletal system and connective tissue: Secondary | ICD-10-CM | POA: Insufficient documentation

## 2018-08-01 DIAGNOSIS — E1122 Type 2 diabetes mellitus with diabetic chronic kidney disease: Secondary | ICD-10-CM | POA: Insufficient documentation

## 2018-08-01 NOTE — Progress Notes (Signed)
Subjective:    Patient ID: Earl Hatcher Sr., male    DOB: 05-29-1941, 78 y.o.   MRN: [id number]  HPI Pt is a 78 yo male with PAF, CAD, CHF, cardiomyopathy, HTN who presents to the clinic for follow up. He was not feeling well after the holidays and was seen by cardiology. See note below. They were wanting him to move forward with a cardiac cath but after discussing risk pt was concerned. He is feeling much better now and does not want to proceed.   A/P  1 paroxysmal atrial fibrillation-diagnosed by recent interrogation of device.  He is in sinus rhythm today.  Continue carvedilol for rate control if atrial fibrillation recurs.  Continue apixaban.  Check hemoglobin and renal function.  2 presumed coronary artery disease-based on previous nuclear study with infarct but no ischemia.  He declined cardiac catheterization in the past.  However he now has worsening fatigue and mild dyspnea on exertion.  He would be willing to proceed with catheterization to exclude coronary disease as a cause of his cardiomyopathy (question if revascularization would improve LV function).  The risks and benefits including myocardial infarction, CVA and death discussed and he agrees to proceed.  Hold apixaban 2 days prior to procedure and resume the day after.  Hold Lasix and spironolactone the day of procedure.  Continue statin.  Will need to initiate aspirin at time of catheterization.  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  4 chronic systolic congestive heart failure-patient describes some increased fatigue and dyspnea on exertion.  Question if worsening LV function is contributing.  Plan cardiac catheterization as outlined above as he is now agreeable.  We will plan to repeat echocardiogram.  He appears to be euvolemic.  Continue Lasix and Spironolactone.  Check potassium and renal function. Will also check TSH.  5 hypertension-patient's blood pressure is controlled.  Continue  present medications and follow.  6 prior ICD-followed by electrophysiology.  7 hyperlipidemia-continue statin.  Check lipids and liver.  Note he is describing some muscle pain.  If this worsens we will consider trying a different statin.   Review of Systems See HPI>     Objective:   Physical Exam Vitals signs reviewed.  Constitutional:      Appearance: Normal appearance. He is obese.  HENT:     Head: Normocephalic and atraumatic.  Neck:     Musculoskeletal: Normal range of motion.  Cardiovascular:     Rate and Rhythm: Normal rate and regular rhythm.  Pulmonary:     Effort: Pulmonary effort is normal.  Neurological:     General: No focal deficit present.     Mental Status: He is alert and oriented to person, place, and time.  Psychiatric:        Mood and Affect: Mood normal.        Behavior: Behavior normal.           Assessment & Plan:  Earl White KitchenMarland KitchenChrishaun was seen today for medication management.  Diagnoses and all orders for this visit:  Essential hypertension, benign -     BASIC METABOLIC PANEL WITH GFR  Systolic dysfunction with acute on chronic heart failure (HCC) -     spironolactone (ALDACTONE) 25 MG tablet; Take 0.5 tablets (12.5 mg total) by mouth daily.  Congestive dilated cardiomyopathy (HCC) -     spironolactone (ALDACTONE) 25 MG tablet; Take 0.5 tablets (12.5 mg total) by mouth daily.  Paroxysmal atrial fibrillation (HCC)  History of gout -  Uric acid  Medication management -     BASIC METABOLIC PANEL WITH GFR  Type 2 diabetes mellitus with chronic kidney disease, without long-term current use of insulin, unspecified CKD stage (HCC) -     POCT UA - Microalbumin   Pt is feeling much better. Obviously he is high CV risk. I would strongly urge him with any more symptoms to go through with heart cath. His heart is weak and putting any more strain on it would not be good.  BP controlled today.  No extremity edema.   Will check labs. Stopped  allopurinol about a year ago with no flares. Will check uric acid today.   Not due for [facility].  Micro due and normal. Pt is on entresto with some ARB in it.  .. Diabetic Foot Exam - Simple   Simple Foot Form Visual Inspection No deformities, no ulcerations, no other skin breakdown bilaterally:  Yes Sensation Testing Intact to touch and monofilament testing bilaterally:  Yes Pulse Check Posterior Tibialis and Dorsalis pulse intact bilaterally:  Yes Comments Decreased sensation in heels and top of feet. Very dark colored toe nail on pinky toe of left foot       .Earl White KitchenSpent 30 minutes with patient and greater than 50 percent of visit spent counseling patient regarding treatment plan. Spent 15 minutes reviewing chart before arrival.

## 2018-08-04 NOTE — Progress Notes (Deleted)
HPI: FU congestive heart failure. Patient previously resided in [place] and then [place]. He apparently was diagnosed with a cardiomyopathy. He does not know the etiology and denies catheterization. He has had previous CRT-D. He moved here in March of 2014. Last echocardiogram April 2016 showed ejection fraction 25-30%. There was grade 1 diastolic dysfunction. Left atrium is mildly dilated. Patient agreed to nuclear study April 2017. This showed ejection fraction 24% There was a large inferior lateral scar with minimal peri-infarct ischemia.Patient did not want to pursue cardiac catheterization.Recently had device interrogated and found to have 3 episodes of SVT and 2 VT episodes.  There was note of atrial tachycardia and atrial fibrillation.  He was seen in the office and patient had run out of his carvedilol.  At last ov cath scheduled but pt canceled. Since last seen,   Current Outpatient Medications  Medication Sig Dispense Refill  . apixaban (ELIQUIS) 5 MG TABS tablet Take 1 tablet (5 mg total) by mouth 2 (two) times daily. 60 tablet 6  . atorvastatin (LIPITOR) 80 MG tablet Take 1 tablet (80 mg total) by mouth daily. 90 tablet 3  . carvedilol (COREG) 25 MG tablet Take 1 tablet (25 mg total) by mouth 2 (two) times daily with a meal. Appointment is needed for refills. 60 tablet 2  . ENTRESTO 97-103 MG TAKE 1 TABLET BY MOUTH TWICE DAILY 180 tablet 3  . furosemide (LASIX) 40 MG tablet Take 1 tablet (40 mg total) by mouth daily. 60 tablet 2  . metFORMIN (GLUCOPHAGE-XR) 750 MG 24 hr tablet TAKE 1 TABLET BY MOUTH ONCE DAILY WITH BREAKFAST 90 tablet 0  . sennosides-docusate sodium (SENOKOT-S) 8.6-50 MG tablet Take 1 tablet by mouth daily as needed. For constipation    . sildenafil (VIAGRA) 100 MG tablet Take 1 tablet (100 mg total) by mouth as needed for erectile dysfunction. 10 tablet 5  . spironolactone (ALDACTONE) 25 MG tablet Take 0.5 tablets (12.5 mg total) by mouth daily. 30  tablet 6   No current facility-administered medications for this visit.      Past Medical History:  Diagnosis Date  . BPH (benign prostatic hyperplasia)   . CHF (congestive heart failure) ([place])   . Diabetes mellitus ([place])    Type II. Diet controlled  . Family history of breast cancer   . Family history of prostate cancer   . Gout   . Hyperlipidemia   . Hypertension   . Nonischemic cardiomyopathy ([place])    a. s/p STJ CRTD  . Paroxysmal atrial fibrillation ([place]) 12/25/15  . Prostate cancer [facility])     Past Surgical History:  Procedure Laterality Date  . CARDIAC DEFIBRILLATOR PLACEMENT  Jan 2014   SJM Quadra Assura implanted by Dr [name] in [place] History   Socioeconomic History  . Marital status: Widowed    Spouse name: Not on file  . Number of children: 1  . Years of education: masters  . Highest education level: Master's degree (e.g., MA, MS, MEng, MEd, MSW, MBA)  Occupational History  . Occupation: retired    Comment: [employer]  Social Needs  . Financial resource strain: Not hard at all  . Food insecurity:    Worry: Never true    Inability: Never true  . Transportation needs:    Medical: No    Non-medical: No  Tobacco Use  . Smoking status: Never Smoker  . Smokeless tobacco: Never Used  Substance and Sexual  Activity  . Alcohol use: Yes    Comment: Occasional  . Drug use: No  . Sexual activity: Yes  Lifestyle  . Physical activity:    Days per week: 3 days    Minutes per session: 10 min  . Stress: Not at all  Relationships  . Social connections:    Talks on phone: More than three times a week    Gets together: Three times a week    Attends religious service: Never    Active member of club or organization: No    Attends meetings of clubs or organizations: Never    Relationship status: Widowed  . Intimate partner violence:    Fear of current or ex partner: No    Emotionally abused: No    Physically abused: No    Forced sexual  activity: No  Other Topics Concern  . Not on file  Social History Narrative   Previously lived in [place] before moving to [place].  He has recently relocated to [facility]. 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    Family History  Problem Relation Age of Onset  . Diabetes Mother   . Hyperlipidemia Mother   . Heart attack Brother   . Breast cancer Sister 64  . Prostate cancer Maternal Uncle   . Prostate cancer Cousin        [name] vet, agent orange exposure  . Prostate cancer Cousin        dx 28-30 yrs. - mat first cousin's son  . Prostate cancer Cousin   . Prostate cancer Cousin   . Diabetes Brother   . Throat cancer Brother 54       [place] vet, ? due to agent orange exposure    ROS: no fevers or chills, productive cough, hemoptysis, dysphasia, odynophagia, melena, hematochezia, dysuria, hematuria, rash, seizure activity, orthopnea, PND, pedal edema, claudication. Remaining systems are negative.  Physical Exam: Well-developed well-nourished in no acute distress.  Skin is warm and dry.  HEENT is normal.  Neck is supple.  Chest is clear to auscultation with normal expansion.  Cardiovascular exam is regular rate and rhythm.  Abdominal exam nontender or distended. No masses palpated. Extremities show no edema. neuro grossly intact  ECG- personally reviewed  A/P  1 paroxysmal atrial fibrillation-patient remains in sinus rhythm on examination.  We will continue with carvedilol for rate control if atrial fibrillation recurs.  Continue apixaban at present dose.  2 presumed coronary artery disease-this is based on previous nuclear study showing infarct but no ischemia.  We recently arranged cardiac catheterization for definitive evaluation but patient canceled procedure.  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  4 chronic systolic congestive heart failure-patient appears to be euvolemic.  Continue present dose of Lasix and  spironolactone.  5 hypertension-blood pressure is controlled.  Continue present medications and follow.  6 hyperlipidemia-continue statin.  7 prior ICD-followed by electrophysiology.  Kirk Ruths, MD

## 2018-08-10 ENCOUNTER — Ambulatory Visit: Payer: Medicare Other | Admitting: Cardiology

## 2018-08-19 ENCOUNTER — Other Ambulatory Visit: Payer: Self-pay | Admitting: Physician Assistant

## 2018-08-19 DIAGNOSIS — E089 Diabetes mellitus due to underlying condition without complications: Secondary | ICD-10-CM

## 2018-08-26 ENCOUNTER — Encounter: Payer: Self-pay | Admitting: Physician Assistant

## 2018-08-26 ENCOUNTER — Ambulatory Visit (INDEPENDENT_AMBULATORY_CARE_PROVIDER_SITE_OTHER): Payer: Medicare Other | Admitting: Physician Assistant

## 2018-08-26 VITALS — BP 114/46 | HR 60 | Temp 98.7°F | Wt 246.0 lb

## 2018-08-26 DIAGNOSIS — I420 Dilated cardiomyopathy: Secondary | ICD-10-CM

## 2018-08-26 DIAGNOSIS — I480 Paroxysmal atrial fibrillation: Secondary | ICD-10-CM

## 2018-08-26 DIAGNOSIS — I10 Essential (primary) hypertension: Secondary | ICD-10-CM

## 2018-08-26 DIAGNOSIS — Z23 Encounter for immunization: Secondary | ICD-10-CM

## 2018-08-26 DIAGNOSIS — I255 Ischemic cardiomyopathy: Secondary | ICD-10-CM | POA: Diagnosis not present

## 2018-08-26 DIAGNOSIS — E1122 Type 2 diabetes mellitus with diabetic chronic kidney disease: Secondary | ICD-10-CM

## 2018-08-26 MED ORDER — EMPAGLIFLOZIN-METFORMIN HCL ER 10-1000 MG PO TB24: 1.0000 | ORAL_TABLET | Freq: Every day | ORAL | 2 refills | Status: DC

## 2018-08-29 ENCOUNTER — Ambulatory Visit (INDEPENDENT_AMBULATORY_CARE_PROVIDER_SITE_OTHER): Payer: Medicare Other

## 2018-08-29 ENCOUNTER — Telehealth: Payer: Self-pay

## 2018-08-29 ENCOUNTER — Encounter: Payer: Self-pay | Admitting: Physician Assistant

## 2018-08-29 DIAGNOSIS — Z95810 Presence of automatic (implantable) cardiac defibrillator: Secondary | ICD-10-CM

## 2018-08-29 DIAGNOSIS — I5022 Chronic systolic (congestive) heart failure: Secondary | ICD-10-CM

## 2018-08-29 LAB — POCT GLYCOSYLATED HEMOGLOBIN (HGB A1C): Hemoglobin A1C: 7.9 % — AB (ref 4.0–5.6)

## 2018-08-29 NOTE — Progress Notes (Signed)
Subjective:    Patient ID: Earl Hatcher Sr., male    DOB: [birth year], 78 y.o.   MRN: [id number]  HPI Patient is a 78 year old male with type 2 diabetes, cardiomyopathy, hypertension, A. fib who presents to the clinic for 69-month follow-up.  Patient is not checking his sugars for his diabetes.  He has been making diet changes but they have included fruit smoothies every day.  He denies any organized exercise.  He denies any open sores or wounds of his lower extremity.  He is taking his metformin daily.  Patient denies any significant lower extremity edema, chest pain, palpitations, shortness of breath.  Overall he seems to be doing really good cardiac.  .. Active Ambulatory Problems    Diagnosis Date Noted  . Essential hypertension, benign 04/28/2013  . BPH (benign prostatic hyperplasia) 04/28/2013  . Gout 04/28/2013  . Congestive dilated cardiomyopathy ([place]) 05/10/2013  . ICD (implantable cardioverter-defibrillator) in place 05/10/2013  . Elevated PSA 11/24/2013  . Systolic dysfunction with acute on chronic heart failure ([place]) 09/03/2014  . Morbid obesity ([place]) 09/03/2014  . Erectile dysfunction 03/10/2015  . Lower extremity edema 09/29/2017  . Abnormal ultrasound of lower extremity 09/29/2017  . Malignant neoplasm of prostate ([place]) 02/03/2018  . Family history of cancer 02/03/2018  . Family history of prostate cancer   . Family history of breast cancer   . Genetic testing 03/25/2018  . Hypotension 05/23/2018  . Paroxysmal atrial fibrillation ([place]) 12/25/2015  . History of gout 08/01/2018  . Type 2 diabetes mellitus with chronic kidney disease, 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[place]) 08/01/2018   Resolved Ambulatory Problems    Diagnosis Date Noted  . Diabetes mellitus due to underlying condition, controlled, without complication, 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[place]) 04/28/2013   Past Medical History:  Diagnosis Date  . CHF (congestive heart failure) ([place])    . Diabetes mellitus ([place])   . Hyperlipidemia   . Hypertension   . Nonischemic cardiomyopathy ([place])   . Prostate cancer [facility])       Review of Systems See HPI.     Objective:   Physical Exam Vitals signs reviewed.  Constitutional:      Appearance: Normal appearance. He is obese.  HENT:     Head: Normocephalic.  Cardiovascular:     Rate and Rhythm: Normal rate.     Heart sounds: Murmur present.  Pulmonary:     Effort: Pulmonary effort is normal.     Breath sounds: Normal breath sounds.  Skin:    General: Skin is warm.     Comments: No lower extremity edema.   Neurological:     General: No focal deficit present.  Psychiatric:        Mood and Affect: Mood normal.        Behavior: Behavior normal.           Assessment & Plan:  [name] KitchenMarland KitchenDiagnoses and all orders for this visit:  Type 2 diabetes mellitus with chronic kidney disease, without long-term current use of insulin, unspecified CKD stage (HCC) -     Empagliflozin-metFORMIN HCl ER (SYNJARDY XR) 03-999 MG TB24; Take 1 tablet by mouth daily. -     Pneumococcal polysaccharide vaccine 23-valent greater than or equal to 2yo subcutaneous/IM  Need for pneumococcal vaccination -     Cancel: Pneumococcal conjugate vaccine 13-valent -     Pneumococcal polysaccharide vaccine 23-valent greater than or equal to 2yo subcutaneous/IM  Essential hypertension, benign  Paroxysmal atrial fibrillation ([place])  Congestive dilated cardiomyopathy ([place])  .[name] Results for orders placed or performed in visit on 08/26/18  POCT HgB A1C  Result Value Ref Range   Hemoglobin A1C 7.9 (A) 4.0 - 5.6 %   HbA1c POC (<> result, manual entry)     HbA1c, POC (prediabetic range)     HbA1c, POC (controlled diabetic range)        A!C is not to goal. Actually up from last visit.  Patient is on metformin.  Decided to stop metformin and to start Synjardy.  I think this would be a great addition to patient especially since he has heart failure.  The  GLT 2 could potentially have some great benefits on his cardiomyopathy as well as decreasing sugars. Discussed side effects.   We also discussed fruit smoothies.  I think he should limit these.  I am concerned that the fruit is increasing his sugars.  I went ahead and printed out a glycemic index handout that shows him foods with high glycemic indexes.  I encouraged him that I am happy he is making effort to try to do healthy diet changes.  Patient did agree to the pneumonia 23 vaccine today.  Tolerated it without complication.  On STATIN/ACE. BP controlled.   Encourage patient to get eye exam.  Follow-up in 3 months.

## 2018-08-29 NOTE — Telephone Encounter (Signed)
No changes; continue present dose of lasix, fluid restriction and low Na diet Earl White

## 2018-08-29 NOTE — Progress Notes (Signed)
EPIC Encounter for ICM Monitoring  Patient Name: Earl Longsworth Sr. is a 78 y.o. male Date: 08/29/2018 Primary Care Physican: Donella Stade, PA-C Primary Cardiologist:Crenshaw Electrophysiologist: Allred Bi-V Pacing: 98% LastWeight: 235lbs Today's Weight: 232 lbs    1 Episode NSVT 08/15/2018             Heart Failure questions reviewed.  No swelling or shortness of breath.  Weight stable.   Report: Thoracic impedance abnormal suggesting fluid accumulation starting 08/24/2018.   Prescribed: Furosemide 40 mg1tablet (40 mg total) daily.  Labs: 07/27/2018 Creatinine 1.30, BUN 28, Potassium 4.5, Sodium 143 07/13/2018 Creatinine 1.37, BUN 24, Potassium 4.8, Sodium 142 09/29/2017 Creatinine 1.02, BUN 20, Potassium 5.4, Sodium 140, EGFR 71-82  Recommendations:  Advised to limit salt and fluid intake daily.   Phone note to Dr [name] asking if he has any recommendations.   Follow-up plan: ICM clinic phone appointment on 08/29/2018.       Copy of ICM check sent to Dr. Rayann Heman and Dr [name] for review and recommendations if needed.   3 month ICM trend: 08/29/2018    AT/AF   1 Year ICM trend:       Rosalene Billings, RN 08/29/2018 10:25 AM

## 2018-08-29 NOTE — Telephone Encounter (Signed)
Spoke with patient for monthly ICM follow up.  Advised of remote transmission results.  Patient says he has not changed anything with diet and thinks he is restricting salt and fluid intake.    SYMPTOMS: None, weight stable.  CORVUE REPORT: See cc'd ICM chart note for details.  Thoracic impedance suggesting fluid accumulation since 08/24/2018        1 Episode NSVT 08/15/2018            PRESCRIBED: Furosemide 40 mg1tablet (40 mg total) daily.  Labs: 07/27/2018 Creatinine 1.30, BUN 28, Potassium 4.5, Sodium 143 07/13/2018 Creatinine 1.37, BUN 24, Potassium 4.8, Sodium 142 09/29/2017 Creatinine 1.02, BUN 20, Potassium 5.4, Sodium 140, EGFR 71-82  RECOMMENDATIONS:  Advised patient will send to Dr [name] for review and if any recommendations will call him back.    Recheck fluid levels and remote transmission scheduled 09/06/2018.

## 2018-08-29 NOTE — Telephone Encounter (Signed)
Call to patient and advised of Dr Jacalyn Lefevre recommendation to continue present dose of Lasix, restrict salt and fluid intake.  He said he has probably been eating too much salt and cut back.  Will recheck fluid levels 09/06/2018.

## 2018-08-29 NOTE — Progress Notes (Signed)
Dr Jacalyn Lefevre recommendation per phone note. Call to patient and provided recommendations.      Telephone Encounter  Signed  Creation Time:  08/29/2018 2:42 PM          Signed        No changes; continue present dose of lasix, fluid restriction and low Na diet Kirk Ruths

## 2018-09-06 ENCOUNTER — Ambulatory Visit (INDEPENDENT_AMBULATORY_CARE_PROVIDER_SITE_OTHER): Payer: Medicare Other

## 2018-09-06 DIAGNOSIS — I5022 Chronic systolic (congestive) heart failure: Secondary | ICD-10-CM

## 2018-09-06 DIAGNOSIS — Z95810 Presence of automatic (implantable) cardiac defibrillator: Secondary | ICD-10-CM

## 2018-09-07 NOTE — Progress Notes (Signed)
EPIC Encounter for ICM Monitoring  Patient Name: Earl Kingsley Sr. is a 78 y.o. male Date: 09/07/2018 Primary Care Physican: Lavada Mesi Primary Cardiologist:Crenshaw Electrophysiologist: Allred Bi-V Pacing: 98% LastWeight: 232lbs Today's Weight: 232 lbs     Heart Failure questions reviewed and pt is asymptomatic.   Report: Thoracic impedance returned to normal since 3/2 remote transmission.   Prescribed:Furosemide 40 mg1tablet (40 mg total) daily.  Labs: 07/27/2018 Creatinine 1.30, BUN 28, Potassium 4.5, Sodium 143 07/13/2018 Creatinine 1.37, BUN 24, Potassium 4.8, Sodium 142 09/29/2017 Creatinine 1.02, BUN 20, Potassium 5.4, Sodium 140, EGFR 71-82  Recommendations: Advised to limit salt and fluid intake daily.     Follow-up plan: ICM clinic phone appointment on4/11/2018.    Copy of ICM check sent to Dr.Allred.  3 month ICM trend: 09/06/2018    1 Year ICM trend:       Rosalene Billings, RN 09/07/2018 12:02 PM

## 2018-09-22 ENCOUNTER — Ambulatory Visit (INDEPENDENT_AMBULATORY_CARE_PROVIDER_SITE_OTHER): Payer: Medicare Other | Admitting: *Deleted

## 2018-09-22 ENCOUNTER — Other Ambulatory Visit: Payer: Self-pay

## 2018-09-22 DIAGNOSIS — I255 Ischemic cardiomyopathy: Secondary | ICD-10-CM

## 2018-09-23 ENCOUNTER — Telehealth: Payer: Self-pay

## 2018-09-23 NOTE — Telephone Encounter (Signed)
Spoke with patient to remind of missed remote transmission 

## 2018-09-24 LAB — CUP PACEART REMOTE DEVICE CHECK
Battery Remaining Longevity: 25 mo
Battery Remaining Percentage: 28 %
Battery Voltage: 2.84 V
Brady Statistic AP VP Percent: 90 %
Brady Statistic AP VS Percent: 1.4 %
Brady Statistic AS VP Percent: 7.5 %
Brady Statistic AS VS Percent: 1 %
Brady Statistic RA Percent Paced: 91 %
Date Time Interrogation Session: 20200327140632
HighPow Impedance: 43 Ohm
HighPow Impedance: 43 Ohm
Implantable Lead Implant Date: 20140117
Implantable Lead Implant Date: 20140117
Implantable Lead Implant Date: 20140117
Implantable Lead Location: 753858
Implantable Lead Location: 753859
Implantable Lead Location: 753860
Lead Channel Impedance Value: 450 Ohm
Lead Channel Impedance Value: 450 Ohm
Lead Channel Pacing Threshold Amplitude: 0.75 V
Lead Channel Pacing Threshold Amplitude: 1 V
Lead Channel Pacing Threshold Amplitude: 1.125 V
Lead Channel Pacing Threshold Pulse Width: 0.5 ms
Lead Channel Pacing Threshold Pulse Width: 0.5 ms
Lead Channel Pacing Threshold Pulse Width: 0.5 ms
Lead Channel Sensing Intrinsic Amplitude: 11.6 mV
Lead Channel Sensing Intrinsic Amplitude: 5 mV
Lead Channel Setting Pacing Amplitude: 2 V
Lead Channel Setting Pacing Amplitude: 2.125
Lead Channel Setting Pacing Pulse Width: 0.5 ms
Lead Channel Setting Pacing Pulse Width: 0.5 ms
Lead Channel Setting Sensing Sensitivity: 0.5 mV
MDC IDC MSMT LEADCHNL LV IMPEDANCE VALUE: 900 Ohm
MDC IDC PG IMPLANT DT: 20140117
MDC IDC SET LEADCHNL RA PACING AMPLITUDE: 1.75 V
Pulse Gen Serial Number: 7070427

## 2018-09-28 ENCOUNTER — Encounter: Payer: Self-pay | Admitting: Cardiology

## 2018-09-28 NOTE — Progress Notes (Signed)
Remote ICD transmission.   

## 2018-10-03 ENCOUNTER — Other Ambulatory Visit: Payer: Self-pay

## 2018-10-03 ENCOUNTER — Ambulatory Visit (INDEPENDENT_AMBULATORY_CARE_PROVIDER_SITE_OTHER): Payer: Medicare Other

## 2018-10-03 DIAGNOSIS — I5022 Chronic systolic (congestive) heart failure: Secondary | ICD-10-CM

## 2018-10-03 DIAGNOSIS — Z95810 Presence of automatic (implantable) cardiac defibrillator: Secondary | ICD-10-CM

## 2018-10-04 NOTE — Progress Notes (Signed)
EPIC Encounter for ICM Monitoring  Patient Name: Earl Lowrimore Sr. is a 78 y.o. male Date: 10/04/2018 Primary Care Physican: Lavada Mesi Primary Cardiologist:Crenshaw Electrophysiologist: Allred Bi-V Pacing: 98% 10/04/2018 Weight:237lbs  Heart Failure questions reviewed and pt is asymptomatic. He is cooking more at home since he being quarantined because of the COVID Virus but feels like he is eating less healthy than he was before being at home all the time.    Report: Thoracic impedancetrending slightly abnormal suggesting fluid accumulation since 10/02/2018  Prescribed:Furosemide 40 mg1tablet (40 mg total) daily.  Labs: 07/27/2018 Creatinine 1.30, BUN 28, Potassium 4.5, Sodium 143 07/13/2018 Creatinine 1.37, BUN 24, Potassium 4.8, Sodium 142 09/29/2017 Creatinine 1.02, BUN 20, Potassium 5.4, Sodium 140, EGFR 71-82  Recommendations:Advised to limit salt intake daily and encouraged to call for fluid symptoms.   Follow-up plan: ICM clinic phone appointment on4/15/2020 to recheck fluid levels.   Copy of ICM check sent to Dr.Allred and Dr [name] for review and recommendations if needed.  3 month ICM trend: 10/03/2018    1 Year ICM trend:       Earl Billings, RN 10/04/2018 5:10 PM

## 2018-10-08 ENCOUNTER — Other Ambulatory Visit: Payer: Self-pay | Admitting: Physician Assistant

## 2018-10-08 DIAGNOSIS — N521 Erectile dysfunction due to diseases classified elsewhere: Secondary | ICD-10-CM

## 2018-10-12 ENCOUNTER — Other Ambulatory Visit: Payer: Self-pay

## 2018-10-12 ENCOUNTER — Ambulatory Visit (INDEPENDENT_AMBULATORY_CARE_PROVIDER_SITE_OTHER): Payer: Medicare Other

## 2018-10-12 DIAGNOSIS — I5022 Chronic systolic (congestive) heart failure: Secondary | ICD-10-CM

## 2018-10-12 DIAGNOSIS — Z95810 Presence of automatic (implantable) cardiac defibrillator: Secondary | ICD-10-CM

## 2018-10-14 NOTE — Progress Notes (Signed)
EPIC Encounter for ICM Monitoring  Patient Name: Earl Armistead Sr. is a 78 y.o. male Date: 10/14/2018 Primary Care Physican: Lavada Mesi Primary Cardiologist:Crenshaw Electrophysiologist: Allred Bi-V Pacing: 98% 10/04/2018 Weight:237lbs  Heart Failure questions reviewedand pt isasymptomatic.     Report: Thoracic impedancereturned to normal since 10/03/2018 remote transmission.  Prescribed:Furosemide 40 mg1tablet (40 mg total) daily.  Labs: 07/27/2018 Creatinine 1.30, BUN 28, Potassium 4.5, Sodium 143 07/13/2018 Creatinine 1.37, BUN 24, Potassium 4.8, Sodium 142 09/29/2017 Creatinine 1.02, BUN 20, Potassium 5.4, Sodium 140, EGFR 71-82  Recommendations:Encouraged to call for fluid symptoms.   Follow-up plan: ICM clinic phone appointment on5/18/2020.   Copy of ICM check sent to Dr.Allred.   3 month ICM trend: 10/12/2018    1 Year ICM trend:       Rosalene Billings, RN 10/14/2018 2:12 PM

## 2018-11-07 ENCOUNTER — Other Ambulatory Visit: Payer: Self-pay | Admitting: Internal Medicine

## 2018-11-07 ENCOUNTER — Telehealth: Payer: Self-pay | Admitting: *Deleted

## 2018-11-07 NOTE — Telephone Encounter (Signed)
Spoke with patient regarding Merlin alert for 3 monitor-zone VT episodes. 2 VT episodes on 5/10 beginning at 14:27 show VT, duration 37min 12sec, V rate 155bpm (though initially hovered at detection rate of 150bpm). Episode from 5/7 appears atrially-driven, duration 43PIR. 5 NSVT episodes with no EGMs available.   "AMS" episodes show true AF and atrial oversensing. History of RA lead noise, AP 92%. Difficult to discern atrial capture with current gain, atrial auto capture on.  Pt reports he was symptomatic with VT episode on 5/10--vision blurry, nauseous, stopped what he was doing and took deep breaths until symptoms passed (~3-5 minutes). Compliant with cardiac meds, no missed doses. Pt feels fine today. Advised I will reach out to Dr. Rayann Heman for recommendations and call him back. Discussed ED precautions for new or worsening symptoms. Pt verbalizes understanding and thanked me for my call.

## 2018-11-08 ENCOUNTER — Other Ambulatory Visit: Payer: Self-pay | Admitting: Physician Assistant

## 2018-11-08 ENCOUNTER — Ambulatory Visit (INDEPENDENT_AMBULATORY_CARE_PROVIDER_SITE_OTHER): Payer: Medicare Other

## 2018-11-08 ENCOUNTER — Other Ambulatory Visit: Payer: Self-pay

## 2018-11-08 DIAGNOSIS — Z95810 Presence of automatic (implantable) cardiac defibrillator: Secondary | ICD-10-CM

## 2018-11-08 DIAGNOSIS — I5022 Chronic systolic (congestive) heart failure: Secondary | ICD-10-CM | POA: Diagnosis not present

## 2018-11-08 DIAGNOSIS — E782 Mixed hyperlipidemia: Secondary | ICD-10-CM

## 2018-11-09 NOTE — Telephone Encounter (Signed)
Per Dr. Rayann Heman, need to see patient in office for device reprogramming and to discuss episode. Dr. Rayann Heman recommended scheduling for appointment tomorrow, 11/10/18.   Pt agreeable to an appointment at 2:00pm. He is aware of office address. Advised I will contact him in the morning to confirm that Dr. Rayann Heman will be available at that time. He is agreeable and thanked me for my call.

## 2018-11-10 ENCOUNTER — Other Ambulatory Visit: Payer: Self-pay

## 2018-11-10 ENCOUNTER — Ambulatory Visit (INDEPENDENT_AMBULATORY_CARE_PROVIDER_SITE_OTHER): Payer: Medicare Other | Admitting: *Deleted

## 2018-11-10 DIAGNOSIS — Z95810 Presence of automatic (implantable) cardiac defibrillator: Secondary | ICD-10-CM

## 2018-11-10 DIAGNOSIS — I472 Ventricular tachycardia, unspecified: Secondary | ICD-10-CM

## 2018-11-10 LAB — CUP PACEART INCLINIC DEVICE CHECK
Battery Remaining Longevity: 24 mo
Brady Statistic RA Percent Paced: 92 %
Brady Statistic RV Percent Paced: 97 %
Date Time Interrogation Session: 20200514180418
HighPow Impedance: 40 Ohm
Implantable Lead Implant Date: 20140117
Implantable Lead Implant Date: 20140117
Implantable Lead Implant Date: 20140117
Implantable Lead Location: 753858
Implantable Lead Location: 753859
Implantable Lead Location: 753860
Implantable Pulse Generator Implant Date: 20140117
Lead Channel Impedance Value: 425 Ohm
Lead Channel Impedance Value: 437.5 Ohm
Lead Channel Impedance Value: 837.5 Ohm
Lead Channel Pacing Threshold Amplitude: 0.75 V
Lead Channel Pacing Threshold Amplitude: 1 V
Lead Channel Pacing Threshold Amplitude: 1.125 V
Lead Channel Pacing Threshold Pulse Width: 0.5 ms
Lead Channel Pacing Threshold Pulse Width: 0.5 ms
Lead Channel Pacing Threshold Pulse Width: 0.5 ms
Lead Channel Sensing Intrinsic Amplitude: 11.6 mV
Lead Channel Sensing Intrinsic Amplitude: 5 mV
Lead Channel Setting Pacing Amplitude: 1.625
Lead Channel Setting Pacing Amplitude: 2 V
Lead Channel Setting Pacing Amplitude: 2.125
Lead Channel Setting Pacing Pulse Width: 0.5 ms
Lead Channel Setting Pacing Pulse Width: 0.5 ms
Lead Channel Setting Sensing Sensitivity: 0.5 mV
Pulse Gen Serial Number: 7070427

## 2018-11-10 NOTE — Progress Notes (Signed)
CRT-D device check in office, added-on due to recent symptomatic VT episode in monitor zone. Thresholds and sensing consistent with previous device measurements. Lead impedance trends stable over time. Atrial lead noise previously noted, isometrics performed, no oversensing noted with RA sensitivity decreased to 0.52mV, reprogrammed AutoSense off. <1% AT/AF--RA lead noise and true AF, +Eliquis. 5 NSVT episodes without EGMs. 3 monitored VT episodes--true VT on 5/10 and SVT, previously reviewed by Dr. Rayann Heman. Changes per Dr. Rayann Heman: VT zone lowered to 148bpm, NID 35, ATP burst x3, ramp x3, no shocks enabled, chamber onset and interval stability discriminators enabled. Patient bi-ventricularly pacing 97% of the time. Device programmed with appropriate safety margins. Heart failure diagnostics reviewed and trends are stable for patient at present time, followed by ICM clinic. Vibratory alerts demonstrated for patient. BPA software upgrade delivered today. Estimated longevity 2.0 years.  Patient enrolled in remote follow up. Patient education completed including shock plan. ROV with JA in 12/2018.

## 2018-11-10 NOTE — Telephone Encounter (Signed)
Spoke with patient to confirm appointment today at 2:00pm. He verbalizes understanding.

## 2018-11-11 ENCOUNTER — Telehealth: Payer: Self-pay

## 2018-11-11 NOTE — Progress Notes (Signed)
EPIC Encounter for ICM Monitoring  Patient Name: Earl Filter Sr. is a 78 y.o. male Date: 11/11/2018 Primary Care Physican: Lavada Mesi Primary Cardiologist:Crenshaw Electrophysiologist: Allred Bi-V Pacing: 97% 4/7/2020Weight:237lbs   Attempted call to patient and unable to reach.   Transmission reviewed.  3 episodes of VT occurred 5/10 and has been addressed by device clinic.  Merlin Thoracic impedancenormal.    Prescribed:Furosemide 40 mg1tablet (40 mg total) daily.  Labs: 07/27/2018 Creatinine 1.30, BUN 28, Potassium 4.5, Sodium 143 07/13/2018 Creatinine 1.37, BUN 24, Potassium 4.8, Sodium 142 09/29/2017 Creatinine 1.02, BUN 20, Potassium 5.4, Sodium 140, EGFR 71-82  Recommendations:None-unable to reach  Follow-up plan: ICM clinic phone appointment on6/24/2020.  Copy of ICM check sent to Dr.Allred.    3 month ICM trend: 11/07/2018    1 Year ICM trend:       Rosalene Billings, RN 11/11/2018 1:42 PM

## 2018-11-11 NOTE — Telephone Encounter (Signed)
Remote ICM transmission received.  Attempted call to patient regarding ICM remote transmission and no answer.  

## 2018-11-24 ENCOUNTER — Ambulatory Visit: Payer: Medicare Other | Admitting: Family Medicine

## 2018-11-25 ENCOUNTER — Encounter: Payer: Self-pay | Admitting: Physician Assistant

## 2018-11-25 ENCOUNTER — Ambulatory Visit (INDEPENDENT_AMBULATORY_CARE_PROVIDER_SITE_OTHER): Payer: Medicare Other | Admitting: Physician Assistant

## 2018-11-25 VITALS — BP 128/66 | HR 58 | Temp 98.9°F | Ht 70.0 in | Wt 238.0 lb

## 2018-11-25 DIAGNOSIS — I255 Ischemic cardiomyopathy: Secondary | ICD-10-CM | POA: Diagnosis not present

## 2018-11-25 DIAGNOSIS — E1122 Type 2 diabetes mellitus with diabetic chronic kidney disease: Secondary | ICD-10-CM

## 2018-11-25 DIAGNOSIS — I10 Essential (primary) hypertension: Secondary | ICD-10-CM | POA: Diagnosis not present

## 2018-11-25 DIAGNOSIS — I5023 Acute on chronic systolic (congestive) heart failure: Secondary | ICD-10-CM | POA: Diagnosis not present

## 2018-11-25 DIAGNOSIS — I420 Dilated cardiomyopathy: Secondary | ICD-10-CM

## 2018-11-25 DIAGNOSIS — E785 Hyperlipidemia, unspecified: Secondary | ICD-10-CM

## 2018-11-25 DIAGNOSIS — E782 Mixed hyperlipidemia: Secondary | ICD-10-CM | POA: Diagnosis not present

## 2018-11-25 MED ORDER — FUROSEMIDE 40 MG PO TABS: 40.0000 mg | ORAL_TABLET | Freq: Every day | ORAL | 2 refills | Status: DC

## 2018-11-25 MED ORDER — EMPAGLIFLOZIN-METFORMIN HCL ER 10-1000 MG PO TB24: 1.0000 | ORAL_TABLET | Freq: Every day | ORAL | 2 refills | Status: DC

## 2018-11-25 NOTE — Progress Notes (Signed)
Subjective:    Patient ID: Earl Hatcher Sr., male    DOB: 08-26-1940, 78 y.o.   MRN: [id number]  HPI  Pt is a 78 yo male with T2DM, cardiomyopathy, HTN, a.fib who presents to the clinic for a 3 month follow up.   He has not been checking sugars but has stopped his fruit smoothes every day. He is trying to walk more frequently. He denies any open sores or wounds. He is taking synjardy daily.   Pt was having some swelling issues. Dr. [name] wanting him to start fluid restriction and low Na diet. His swelling has since resolved.   Currently he denies any SOB or CP. He follows closely with cardiology.   He does need refills.   .. Active Ambulatory Problems    Diagnosis Date Noted  . Essential hypertension, benign 04/28/2013  . BPH (benign prostatic hyperplasia) 04/28/2013  . Gout 04/28/2013  . Congestive dilated cardiomyopathy ([place]) 05/10/2013  . ICD (implantable cardioverter-defibrillator) in place 05/10/2013  . Elevated PSA 11/24/2013  . Systolic dysfunction with acute on chronic heart failure ([place]) 09/03/2014  . Morbid obesity ([place]) 09/03/2014  . Erectile dysfunction 03/10/2015  . Lower extremity edema 09/29/2017  . Abnormal ultrasound of lower extremity 09/29/2017  . Malignant neoplasm of prostate ([place]) 02/03/2018  . Family history of cancer 02/03/2018  . Family history of prostate cancer   . Family history of breast cancer   . Genetic testing 03/25/2018  . Hypotension 05/23/2018  . Paroxysmal atrial fibrillation ([place]) 12/25/2015  . History of gout 08/01/2018  . Type 2 diabetes mellitus with chronic kidney disease, 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[place]) 08/01/2018   Resolved Ambulatory Problems    Diagnosis Date Noted  . Diabetes mellitus due to underlying condition, controlled, without complication, 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[place]) 04/28/2013   Past Medical History:  Diagnosis Date  . CHF (congestive heart failure) ([place])   . Diabetes mellitus  ([place])   . Hyperlipidemia   . Hypertension   . Nonischemic cardiomyopathy ([place])   . Prostate cancer [facility])       Review of Systems See HPI.     Objective:   Physical Exam Vitals signs reviewed.  Constitutional:      Appearance: Normal appearance.  HENT:     Head: Normocephalic.  Cardiovascular:     Rate and Rhythm: Regular rhythm. Bradycardia present.     Pulses: Normal pulses.     Heart sounds: Murmur present.  Pulmonary:     Effort: Pulmonary effort is normal.     Breath sounds: Normal breath sounds. No wheezing or rhonchi.  Musculoskeletal:     Right lower leg: No edema.     Left lower leg: No edema.  Neurological:     General: No focal deficit present.     Mental Status: He is alert and oriented to person, place, and time.  Psychiatric:        Mood and Affect: Mood normal.           Assessment & Plan:  .[name] KitchenMarland KitchenMarland KitchenEulises was seen today for diabetes.  Diagnoses and all orders for this visit:  Type 2 diabetes mellitus with chronic kidney disease, without long-term current use of insulin, unspecified CKD stage (HCC) -     COMPLETE METABOLIC PANEL WITH GFR -     Hemoglobin A1c -     Empagliflozin-metFORMIN HCl ER (SYNJARDY XR) 03-999 MG TB24; Take 1 tablet by mouth daily.  Systolic dysfunction with acute on chronic  heart failure (HCC) -     COMPLETE METABOLIC PANEL WITH GFR -     furosemide (LASIX) 40 MG tablet; Take 1 tablet (40 mg total) by mouth daily.  Congestive dilated cardiomyopathy (HCC) -     COMPLETE METABOLIC PANEL WITH GFR -     furosemide (LASIX) 40 MG tablet; Take 1 tablet (40 mg total) by mouth daily.  Essential hypertension, benign -     COMPLETE METABOLIC PANEL WITH GFR -     furosemide (LASIX) 40 MG tablet; Take 1 tablet (40 mg total) by mouth daily.  Hyperlipidemia LDL goal <70 -     Lipid Panel w/reflex Direct LDL   3 days too soon for a1c. Ordered labs and will get drawn next week.  Will adjust medications accordingly.  On ACE. On  STATIN.  On GLT-2.  Vaccines up to date.  Follow up in 3 months.   Continue to fluid restrict and low sodium.   Discussed breifly high risk of COVID complications and to stay out of crowds, good handwashing, and wear mask.

## 2018-11-28 ENCOUNTER — Encounter: Payer: Self-pay | Admitting: Physician Assistant

## 2018-11-28 MED ORDER — ATORVASTATIN CALCIUM 80 MG PO TABS: 80.0000 mg | ORAL_TABLET | Freq: Every day | ORAL | 3 refills | Status: DC

## 2018-12-02 DIAGNOSIS — E785 Hyperlipidemia, unspecified: Secondary | ICD-10-CM | POA: Diagnosis not present

## 2018-12-02 DIAGNOSIS — I5023 Acute on chronic systolic (congestive) heart failure: Secondary | ICD-10-CM | POA: Diagnosis not present

## 2018-12-02 DIAGNOSIS — I10 Essential (primary) hypertension: Secondary | ICD-10-CM | POA: Diagnosis not present

## 2018-12-02 DIAGNOSIS — E1122 Type 2 diabetes mellitus with diabetic chronic kidney disease: Secondary | ICD-10-CM | POA: Diagnosis not present

## 2018-12-02 DIAGNOSIS — I420 Dilated cardiomyopathy: Secondary | ICD-10-CM | POA: Diagnosis not present

## 2018-12-03 LAB — COMPLETE METABOLIC PANEL WITH GFR
AG Ratio: 1.9 (calc) (ref 1.0–2.5)
ALT: 19 U/L (ref 9–46)
AST: 17 U/L (ref 10–35)
Albumin: 4.3 g/dL (ref 3.6–5.1)
Alkaline phosphatase (APISO): 68 U/L (ref 35–144)
BUN/Creatinine Ratio: 19 (calc) (ref 6–22)
BUN: 24 mg/dL (ref 7–25)
CO2: 24 mmol/L (ref 20–32)
Calcium: 9.8 mg/dL (ref 8.6–10.3)
Chloride: 108 mmol/L (ref 98–110)
Creat: 1.26 mg/dL — ABNORMAL HIGH (ref 0.70–1.18)
GFR, Est African American: 63 mL/min/{1.73_m2} (ref >=60)
GFR, Est Non African American: 54 mL/min/{1.73_m2} — ABNORMAL LOW (ref >=60)
Globulin: 2.3 g/dL (calc) (ref 1.9–3.7)
Glucose, Bld: 143 mg/dL — ABNORMAL HIGH (ref 65–99)
Potassium: 4.7 mmol/L (ref 3.5–5.3)
Sodium: 142 mmol/L (ref 135–146)
Total Bilirubin: 0.8 mg/dL (ref 0.2–1.2)
Total Protein: 6.6 g/dL (ref 6.1–8.1)

## 2018-12-03 LAB — LIPID PANEL W/REFLEX DIRECT LDL
Cholesterol: 145 mg/dL (ref <200)
HDL: 36 mg/dL — ABNORMAL LOW (ref >=40)
LDL Cholesterol (Calc): 88 mg/dL (calc)
Non-HDL Cholesterol (Calc): 109 mg/dL (calc) (ref <130)
Total CHOL/HDL Ratio: 4 (calc) (ref <5.0)
Triglycerides: 118 mg/dL (ref <150)

## 2018-12-03 LAB — HEMOGLOBIN A1C
Hgb A1c MFr Bld: 7.1 % of total Hgb — ABNORMAL HIGH (ref <5.7)
Mean Plasma Glucose: 157 (calc)
eAG (mmol/L): 8.7 (calc)

## 2018-12-04 NOTE — Progress Notes (Signed)
Call pt: cholesterol looks great. Kidney function is stable and looks good. A!C improved to 7.1 down from 7.9. GREAT news. Keep up good work. Goal is under 7.0.

## 2018-12-07 ENCOUNTER — Other Ambulatory Visit: Payer: Self-pay | Admitting: Physician Assistant

## 2018-12-07 DIAGNOSIS — E089 Diabetes mellitus due to underlying condition without complications: Secondary | ICD-10-CM

## 2018-12-21 ENCOUNTER — Ambulatory Visit (INDEPENDENT_AMBULATORY_CARE_PROVIDER_SITE_OTHER): Payer: Medicare Other

## 2018-12-21 DIAGNOSIS — Z95810 Presence of automatic (implantable) cardiac defibrillator: Secondary | ICD-10-CM | POA: Diagnosis not present

## 2018-12-21 DIAGNOSIS — I5022 Chronic systolic (congestive) heart failure: Secondary | ICD-10-CM

## 2018-12-22 ENCOUNTER — Ambulatory Visit (INDEPENDENT_AMBULATORY_CARE_PROVIDER_SITE_OTHER): Payer: Medicare Other | Admitting: *Deleted

## 2018-12-22 DIAGNOSIS — I255 Ischemic cardiomyopathy: Secondary | ICD-10-CM | POA: Diagnosis not present

## 2018-12-22 LAB — CUP PACEART REMOTE DEVICE CHECK
Battery Remaining Longevity: 23 mo
Battery Remaining Percentage: 25 %
Battery Voltage: 2.81 V
Brady Statistic AP VP Percent: 94 %
Brady Statistic AP VS Percent: 4.8 %
Brady Statistic AS VP Percent: 1 %
Brady Statistic AS VS Percent: 1 %
Brady Statistic RA Percent Paced: 98 %
Brady Statistic RV Percent Paced: 94 %
Date Time Interrogation Session: 20200625132741
HighPow Impedance: 43 Ohm
HighPow Impedance: 43 Ohm
Implantable Lead Implant Date: 20140117
Implantable Lead Implant Date: 20140117
Implantable Lead Implant Date: 20140117
Implantable Lead Location: 753858
Implantable Lead Location: 753859
Implantable Lead Location: 753860
Implantable Pulse Generator Implant Date: 20140117
Lead Channel Impedance Value: 430 Ohm
Lead Channel Impedance Value: 450 Ohm
Lead Channel Impedance Value: 910 Ohm
Lead Channel Pacing Threshold Amplitude: 0.75 V
Lead Channel Pacing Threshold Amplitude: 1 V
Lead Channel Pacing Threshold Amplitude: 1.125 V
Lead Channel Pacing Threshold Pulse Width: 0.5 ms
Lead Channel Pacing Threshold Pulse Width: 0.5 ms
Lead Channel Pacing Threshold Pulse Width: 0.5 ms
Lead Channel Sensing Intrinsic Amplitude: 11.6 mV
Lead Channel Sensing Intrinsic Amplitude: 3.9 mV
Lead Channel Setting Pacing Amplitude: 1.75 V
Lead Channel Setting Pacing Amplitude: 2 V
Lead Channel Setting Pacing Amplitude: 2.125
Lead Channel Setting Pacing Pulse Width: 0.5 ms
Lead Channel Setting Pacing Pulse Width: 0.5 ms
Lead Channel Setting Sensing Sensitivity: 0.5 mV
Pulse Gen Serial Number: 7070427

## 2018-12-27 NOTE — Progress Notes (Signed)
EPIC Encounter for ICM Monitoring  Patient Name: Lakshya Mcgillicuddy Sr. is a 78 y.o. male Date: 12/27/2018 Primary Care Physican: Lavada Mesi Primary Cardiologist:Crenshaw Electrophysiologist: Allred Bi-V Pacing: 94% 5/29/2020Weight:238lbs   Transmission reviewed.   Merlin Thoracic impedancenormal.    Prescribed:Furosemide 40 mg1tablet (40 mg total) daily.  Labs: 12/02/2018 Creatinine 1.26, BUN 24, Potassium 4.7, Sodium 142, GFR 54-63 07/27/2018 Creatinine 1.30, BUN 28, Potassium 4.5, Sodium 143 07/13/2018 Creatinine 1.37, BUN 24, Potassium 4.8, Sodium 142 09/29/2017 Creatinine 1.02, BUN 20, Potassium 5.4, Sodium 140, EGFR 71-82  Recommendations:None.  Follow-up plan: ICM clinic phone appointment on8/08/2018.  Copy of ICM check sent to Dr.Allred.   3 month ICM trend: 12/22/2018    1 Year ICM trend:       Rosalene Billings, RN 12/27/2018 9:22 AM

## 2019-01-01 ENCOUNTER — Encounter: Payer: Self-pay | Admitting: Cardiology

## 2019-01-01 NOTE — Progress Notes (Signed)
Remote ICD transmission.   

## 2019-01-11 ENCOUNTER — Other Ambulatory Visit: Payer: Self-pay | Admitting: Cardiology

## 2019-01-11 DIAGNOSIS — I420 Dilated cardiomyopathy: Secondary | ICD-10-CM

## 2019-01-14 ENCOUNTER — Other Ambulatory Visit: Payer: Self-pay | Admitting: Cardiology

## 2019-01-14 DIAGNOSIS — I420 Dilated cardiomyopathy: Secondary | ICD-10-CM

## 2019-01-30 ENCOUNTER — Ambulatory Visit (INDEPENDENT_AMBULATORY_CARE_PROVIDER_SITE_OTHER): Payer: Medicare Other

## 2019-01-30 DIAGNOSIS — I5022 Chronic systolic (congestive) heart failure: Secondary | ICD-10-CM

## 2019-01-30 DIAGNOSIS — Z95810 Presence of automatic (implantable) cardiac defibrillator: Secondary | ICD-10-CM | POA: Diagnosis not present

## 2019-02-01 NOTE — Progress Notes (Signed)
EPIC Encounter for ICM Monitoring  Patient Name: Earl Fowle Sr. is a 78 y.o. male Date: 02/01/2019 Primary Care Physican: Donella Stade, PA-C Primary Care Physican: Lavada Mesi Primary Cardiologist:Crenshaw Electrophysiologist: Allred Bi-V Pacing: 94% 8/5/2020Weight:229lbs   Spoke with patient.  Patient is losing weight and focusing on health.  He is feeling better since he has lost weight.  CorVue thoracic impedancenormal.  Prescribed:Furosemide 40 mg1tablet (40 mg total) daily.  Labs: 12/02/2018 Creatinine 1.26, BUN 24, Potassium 4.7, Sodium 142, GFR 54-63 07/27/2018 Creatinine 1.30, BUN 28, Potassium 4.5, Sodium 143 07/13/2018 Creatinine 1.37, BUN 24, Potassium 4.8, Sodium 142 09/29/2017 Creatinine 1.02, BUN 20, Potassium 5.4, Sodium 140, EGFR 71-82  Recommendations:No changes and encouraged to call if experiencing any fluid symptoms.  Follow-up plan: ICM clinic phone appointment on9/01/2019.  Copy of ICM check sent to Dr.Allred.  3 month ICM trend: 01/30/2019    1 Year ICM trend:       Rosalene Billings, RN 02/01/2019 2:58 PM

## 2019-02-04 ENCOUNTER — Other Ambulatory Visit: Payer: Self-pay | Admitting: Adult Health

## 2019-02-04 DIAGNOSIS — I5023 Acute on chronic systolic (congestive) heart failure: Secondary | ICD-10-CM

## 2019-02-04 DIAGNOSIS — I420 Dilated cardiomyopathy: Secondary | ICD-10-CM

## 2019-02-04 DIAGNOSIS — I10 Essential (primary) hypertension: Secondary | ICD-10-CM

## 2019-02-07 NOTE — Telephone Encounter (Signed)
Pt overdue for 6 wk f/u. Please contact pt for future appointment.

## 2019-02-08 NOTE — Telephone Encounter (Signed)
Rx(s) sent to pharmacy electronically.  

## 2019-02-28 ENCOUNTER — Other Ambulatory Visit: Payer: Self-pay | Admitting: Neurology

## 2019-02-28 DIAGNOSIS — E1122 Type 2 diabetes mellitus with diabetic chronic kidney disease: Secondary | ICD-10-CM

## 2019-02-28 MED ORDER — SYNJARDY XR 10-1000 MG PO TB24: 1.0000 | ORAL_TABLET | Freq: Every day | ORAL | 0 refills | Status: DC

## 2019-03-07 ENCOUNTER — Ambulatory Visit (INDEPENDENT_AMBULATORY_CARE_PROVIDER_SITE_OTHER): Payer: Medicare Other

## 2019-03-07 DIAGNOSIS — I5022 Chronic systolic (congestive) heart failure: Secondary | ICD-10-CM | POA: Diagnosis not present

## 2019-03-07 DIAGNOSIS — Z95810 Presence of automatic (implantable) cardiac defibrillator: Secondary | ICD-10-CM | POA: Diagnosis not present

## 2019-03-07 NOTE — Progress Notes (Signed)
EPIC Encounter for ICM Monitoring  Patient Name: Earl Crowson Sr. is a 78 y.o. male Date: 03/07/2019 Primary Care Physican: Lavada Mesi Primary Cardiologist:Crenshaw Electrophysiologist: Allred Bi-V Pacing: 94% 9/8/2020Weight:232lbs   Spoke with patient.  He reports feeling fine.  His son has been visiting since 9/6 and he is off track with low salt diet and had increased fluid intake.  His son leaves today so he will resume low salt diet.   CorVue thoracic impedancesuggesting possible fluid accumulation starting 9/6.  Prescribed:Furosemide 40 mg1tablet (40 mg total) daily.  Labs: 12/02/2018 Creatinine 1.26, BUN 24, Potassium 4.7, Sodium 142, GFR 54-63 07/27/2018 Creatinine 1.30, BUN 28, Potassium 4.5, Sodium 143 07/13/2018 Creatinine 1.37, BUN 24, Potassium 4.8, Sodium 142  Recommendations:  Reinforced limiting salt intake to < 2000 mg daily and fluid intake to 64 oz daily.  Encouraged to call if experiencing fluid symptoms.  Follow-up plan: ICM clinic phone appointment on 03/15/2019 to recheck fluid levels.   91 day device clinic remote transmission 03/23/2019. [name]    Copy of ICM check sent to Dr. Rayann Heman and Dr [name].   3 month ICM trend: 03/07/2019    1 Year ICM trend:       Rosalene Billings, RN 03/07/2019 10:59 AM

## 2019-03-15 ENCOUNTER — Ambulatory Visit (INDEPENDENT_AMBULATORY_CARE_PROVIDER_SITE_OTHER): Payer: Medicare Other

## 2019-03-15 DIAGNOSIS — I5022 Chronic systolic (congestive) heart failure: Secondary | ICD-10-CM

## 2019-03-15 DIAGNOSIS — Z95810 Presence of automatic (implantable) cardiac defibrillator: Secondary | ICD-10-CM

## 2019-03-15 NOTE — Progress Notes (Signed)
EPIC Encounter for ICM Monitoring  Patient Name: Earl White. is a 78 y.o. male Date: 03/15/2019 Primary Care Physican: Lavada Mesi Primary Cardiologist:Crenshaw Electrophysiologist: Allred Bi-V Pacing: 94% 9/8/2020Weight:232lbs    Transmission reviewed.   CorVue thoracic impedancereturned to normal since 9/8 transmission.  Prescribed:Furosemide 40 mg1tablet (40 mg total) daily.  Labs: 12/02/2018 Creatinine 1.26, BUN 24, Potassium 4.7, Sodium 142, GFR 54-63 07/27/2018 Creatinine 1.30, BUN 28, Potassium 4.5, Sodium 143 07/13/2018 Creatinine 1.37, BUN 24, Potassium 4.8, Sodium 142  Recommendations:  None  Follow-up plan: ICM clinic phone appointment on 04/24/2019.   91 day device clinic remote transmission 03/23/2019.    Copy of ICM check sent to Dr. Rayann Heman.   Direct ICM trend viewer: 03/13/2019      Rosalene Billings, RN 03/15/2019 1:06 PM

## 2019-03-20 ENCOUNTER — Other Ambulatory Visit: Payer: Self-pay | Admitting: Cardiology

## 2019-03-20 DIAGNOSIS — I420 Dilated cardiomyopathy: Secondary | ICD-10-CM

## 2019-03-23 ENCOUNTER — Ambulatory Visit (INDEPENDENT_AMBULATORY_CARE_PROVIDER_SITE_OTHER): Payer: Medicare Other | Admitting: *Deleted

## 2019-03-23 DIAGNOSIS — I255 Ischemic cardiomyopathy: Secondary | ICD-10-CM | POA: Diagnosis not present

## 2019-03-24 LAB — CUP PACEART REMOTE DEVICE CHECK
Battery Remaining Longevity: 23 mo
Battery Remaining Percentage: 24 %
Battery Voltage: 2.8 V
Brady Statistic AP VP Percent: 94 %
Brady Statistic AP VS Percent: 4 %
Brady Statistic AS VP Percent: 1 %
Brady Statistic AS VS Percent: 1 %
Brady Statistic RA Percent Paced: 96 %
Date Time Interrogation Session: 20200924120506
HighPow Impedance: 47 Ohm
HighPow Impedance: 47 Ohm
Implantable Lead Implant Date: 20140117
Implantable Lead Implant Date: 20140117
Implantable Lead Implant Date: 20140117
Implantable Lead Location: 753858
Implantable Lead Location: 753859
Implantable Lead Location: 753860
Implantable Pulse Generator Implant Date: 20140117
Lead Channel Impedance Value: 480 Ohm
Lead Channel Impedance Value: 480 Ohm
Lead Channel Impedance Value: 900 Ohm
Lead Channel Pacing Threshold Amplitude: 0.75 V
Lead Channel Pacing Threshold Amplitude: 1 V
Lead Channel Pacing Threshold Amplitude: 1.25 V
Lead Channel Pacing Threshold Pulse Width: 0.5 ms
Lead Channel Pacing Threshold Pulse Width: 0.5 ms
Lead Channel Pacing Threshold Pulse Width: 0.5 ms
Lead Channel Sensing Intrinsic Amplitude: 11.6 mV
Lead Channel Sensing Intrinsic Amplitude: 5 mV
Lead Channel Setting Pacing Amplitude: 1.75 V
Lead Channel Setting Pacing Amplitude: 2 V
Lead Channel Setting Pacing Amplitude: 2.25 V
Lead Channel Setting Pacing Pulse Width: 0.5 ms
Lead Channel Setting Pacing Pulse Width: 0.5 ms
Lead Channel Setting Sensing Sensitivity: 0.5 mV
Pulse Gen Serial Number: 7070427

## 2019-03-31 ENCOUNTER — Encounter: Payer: Self-pay | Admitting: Cardiology

## 2019-03-31 NOTE — Progress Notes (Signed)
Remote ICD transmission.   

## 2019-04-17 ENCOUNTER — Telehealth (INDEPENDENT_AMBULATORY_CARE_PROVIDER_SITE_OTHER): Payer: Medicare Other | Admitting: Internal Medicine

## 2019-04-17 ENCOUNTER — Other Ambulatory Visit: Payer: Self-pay

## 2019-04-17 ENCOUNTER — Encounter: Payer: Self-pay | Admitting: Internal Medicine

## 2019-04-17 VITALS — Ht 70.0 in | Wt 236.0 lb

## 2019-04-17 DIAGNOSIS — I255 Ischemic cardiomyopathy: Secondary | ICD-10-CM | POA: Diagnosis not present

## 2019-04-17 DIAGNOSIS — I10 Essential (primary) hypertension: Secondary | ICD-10-CM

## 2019-04-17 DIAGNOSIS — I5022 Chronic systolic (congestive) heart failure: Secondary | ICD-10-CM | POA: Diagnosis not present

## 2019-04-17 NOTE — Progress Notes (Signed)
Electrophysiology TeleHealth Note  Due to national recommendations of social distancing due to Alpha 19, an audio telehealth visit is felt to be most appropriate for this patient at this time.  Verbal consent was obtained by me for the telehealth visit today.  The patient does not have capability for a virtual visit.  A phone visit is therefore required today.   Date:  04/17/2019   ID:  Earl Hatcher Sr., DOB [birth year], MRN [id number]  Location: patient's home  Provider location:  [place]  Evaluation Performed: Follow-up visit  PCP:  Donella Stade, PA-C   Electrophysiologist:  Dr Rayann Heman  Chief Complaint:  CHF  History of Present Illness:    Earl Mcintyre Sr. is a 78 y.o. male who presents via telehealth conferencing today.  Since last being seen in our clinic, the patient reports doing very well.  Today, he denies symptoms of palpitations, chest pain, shortness of breath,  lower extremity edema, dizziness, presyncope, or syncope.  The patient is otherwise without complaint today.  The patient denies symptoms of fevers, chills, cough, or new SOB worrisome for COVID 19.  Past Medical History:  Diagnosis Date  . BPH (benign prostatic hyperplasia)   . CHF (congestive heart failure) ([place])   . Diabetes mellitus ([place])    Type II. Diet controlled  . Family history of breast cancer   . Family history of prostate cancer   . Gout   . Hyperlipidemia   . Hypertension   . Nonischemic cardiomyopathy ([place])    a. s/p STJ CRTD  . Paroxysmal atrial fibrillation ([place]) 12/25/15  . Prostate cancer [facility])     Past Surgical History:  Procedure Laterality Date  . CARDIAC DEFIBRILLATOR PLACEMENT  Jan 2014   SJM Quadra Assura implanted by Dr [name] in [place]    Current Outpatient Medications  Medication Sig Dispense Refill  . apixaban (ELIQUIS) 5 MG TABS tablet Take 1 tablet (5 mg total) by mouth 2 (two) times daily. 60 tablet 6  . atorvastatin (LIPITOR) 80 MG tablet  Take 1 tablet (80 mg total) by mouth daily. 90 tablet 3  . carvedilol (COREG) 25 MG tablet Take 1 tablet (25 mg total) by mouth 2 (two) times daily with a meal. MUST KEEP APPOINTMENT 05/15/19 WITH DR CRENSHAW FOR FUTURE REFILLS 180 tablet 0  . Empagliflozin-metFORMIN HCl ER (SYNJARDY XR) 03-999 MG TB24 Take 1 tablet by mouth daily. Pt needs appt 30 tablet 0  . ENTRESTO 97-103 MG Take 1 tablet by mouth twice daily 60 tablet 4  . furosemide (LASIX) 40 MG tablet Take 1 tablet (40 mg total) by mouth daily. 60 tablet 2  . sennosides-docusate sodium (SENOKOT-S) 8.6-50 MG tablet Take 1 tablet by mouth daily as needed. For constipation    . sildenafil (VIAGRA) 100 MG tablet TAKE 1 TABLET BY MOUTH AS NEEDED FOR  ERECTILE  DYSFUNCTION 10 tablet 0  . spironolactone (ALDACTONE) 25 MG tablet Take 0.5 tablets (12.5 mg total) by mouth daily. 30 tablet 6   No current facility-administered medications for this visit.     Allergies:   Patient has no known allergies.   Social History:  The patient  reports that he has never smoked. He has never used smokeless tobacco. He reports current alcohol use. He reports that he does not use drugs.   Family History:  The patient's family history includes Breast cancer (age of onset: 56) in his sister; Diabetes in his brother and mother; Heart attack in  his brother; Hyperlipidemia in his mother; Prostate cancer in his cousin, cousin, cousin, cousin, and maternal uncle; Throat cancer (age of onset: 39) in his brother.   ROS:  Please see the history of present illness.   All other systems are personally reviewed and negative.    Exam:    Vital Signs:  Ht 5\' 10"  (1.778 m)   Wt 236 lb (107 kg)   BMI 33.86 kg/m   Well sounding, alert and conversant   Labs/Other Tests and Data Reviewed:    Recent Labs: 07/13/2018: Hemoglobin 15.9; Platelets 253; TSH 1.83 12/02/2018: ALT 19; BUN 24; Creat 1.26; Potassium 4.7; Sodium 142   Wt Readings from Last 3 Encounters:  04/17/19  236 lb (107 kg)  11/25/18 238 lb (108 kg)  08/26/18 246 lb (111.6 kg)     Last device remote is reviewed from [facility] which reveals normal device function, no arrhythmias    ASSESSMENT & PLAN:    1.  Chronic systolic dysfunction/ ischemic CM/CAD Doing well No ischemic symptoms euvolemic Remotes are up do date Normal device function Followed in ICM device clinic  2. HTN Stable No change required today   Follow-up:  12 months   Patient Risk:  after full review of this patients clinical status, I feel that they are at moderate risk at this time.  Today, I have spent 15 minutes with the patient with telehealth technology discussing arrhythmia management .    Army Fossa, MD  04/17/2019 11:13 AM     [facility] [address] [place] [address] [phone] (office) [phone] (fax)

## 2019-04-24 ENCOUNTER — Ambulatory Visit (INDEPENDENT_AMBULATORY_CARE_PROVIDER_SITE_OTHER): Payer: Medicare Other

## 2019-04-24 ENCOUNTER — Telehealth: Payer: Self-pay

## 2019-04-24 DIAGNOSIS — Z95810 Presence of automatic (implantable) cardiac defibrillator: Secondary | ICD-10-CM

## 2019-04-24 DIAGNOSIS — I5022 Chronic systolic (congestive) heart failure: Secondary | ICD-10-CM

## 2019-04-24 NOTE — Telephone Encounter (Signed)
Remote ICM transmission received.  Attempted call to patient regarding ICM remote transmission and left detailed message per DPR.  Advised to return call for any fluid symptoms or questions.  

## 2019-04-24 NOTE — Progress Notes (Signed)
EPIC Encounter for ICM Monitoring  Patient Name: Earl Detoro Sr. is a 78 y.o. male Date: 04/24/2019 Primary Care Physican: Lavada Mesi Primary Cardiologist:Crenshaw Electrophysiologist: Allred Bi-V Pacing: 94% 9/8/2020Weight:232lbs    Attempted call to patient and unable to reach.  Left detailed message per DPR regarding transmission. Transmission reviewed.   CorVue thoracic impedancesuggesting possible ongoing fluid accumulation since 04/18/2019.  Prescribed:Furosemide 40 mg1tablet (40 mg total) daily.  Labs: 12/02/2018 Creatinine 1.26, BUN 24, Potassium 4.7, Sodium 142, GFR 54-63 07/27/2018 Creatinine 1.30, BUN 28, Potassium 4.5, Sodium 143 07/13/2018 Creatinine 1.37, BUN 24, Potassium 4.8, Sodium 142  Recommendations: Left voice mail with ICM number and encouraged to call if experiencing any fluid symptoms.  Follow-up plan: ICM clinic phone appointment on 05/02/2019 to recheck fluid levels.   91 day device clinic remote transmission 06/22/2019.  Office appt 05/15/2019 with Dr. [name].    Copy of ICM check sent to Dr. Rayann Heman and Dr [name].   3 month ICM trend: 04/24/2019    1 Year ICM trend:       Rosalene Billings, RN 04/24/2019 12:42 PM

## 2019-05-02 ENCOUNTER — Ambulatory Visit (INDEPENDENT_AMBULATORY_CARE_PROVIDER_SITE_OTHER): Payer: Medicare Other

## 2019-05-02 DIAGNOSIS — Z95810 Presence of automatic (implantable) cardiac defibrillator: Secondary | ICD-10-CM

## 2019-05-02 DIAGNOSIS — I5022 Chronic systolic (congestive) heart failure: Secondary | ICD-10-CM

## 2019-05-02 NOTE — Progress Notes (Signed)
EPIC Encounter for ICM Monitoring  Patient Name: Earl Magnone Sr. is a 78 y.o. male Date: 05/02/2019 Primary Care Physican: Lavada Mesi Primary Cardiologist:Crenshaw Electrophysiologist: Allred Bi-V Pacing: 94% 9/8/2020Weight:232lbs   Attempted call to patient and unable to reach.   Transmission reviewed.  CorVue thoracic impedance returned to normal since 10/26 remote transmission.  Prescribed:Furosemide 40 mg1tablet (40 mg total) daily.  Labs: 12/02/2018 Creatinine 1.26, BUN 24, Potassium 4.7, Sodium 142, GFR 54-63 07/27/2018 Creatinine 1.30, BUN 28, Potassium 4.5, Sodium 143 07/13/2018 Creatinine 1.37, BUN 24, Potassium 4.8, Sodium 142  Recommendations: Unable to reach.    Follow-up plan: ICM clinic phone appointment on 06/02/2019.   91 day device clinic remote transmission 06/22/2019.  Office appt 05/15/2019 with Dr. [name].    Copy of ICM check sent to Dr. Carolann Littler.   3 month ICM trend: 05/02/2019    1 Year ICM trend:       Rosalene Billings, RN 05/02/2019 4:17 PM

## 2019-05-03 ENCOUNTER — Telehealth: Payer: Self-pay

## 2019-05-03 NOTE — Telephone Encounter (Signed)
Remote ICM transmission received.  Attempted call to patient regarding ICM remote transmission and no answer.  

## 2019-05-09 NOTE — Progress Notes (Deleted)
HPI: FU congestive heart failure. Patient previously resided in [place] and then [place]. He apparently was diagnosed with a cardiomyopathy. He does not know the etiology and denies catheterization. He has had previous CRT-D. He moved here in March of 2014. Last echocardiogram April 2016 showed ejection fraction 25-30%. There was grade 1 diastolic dysfunction. Left atrium is mildly dilated. Patient agreed to nuclear study April 2017. This showed ejection fraction 24% There was a large inferior lateral scar with minimal peri-infarct ischemia.Patient did not want to pursue cardiac catheterization.Previously had device interrogated and found to have 3 episodes of SVT and 2 VT episodes.  There was note of atrial tachycardia and atrial fibrillation.  At last office visit cardiac catheterization was scheduled but patient then canceled. Since last seen,   Current Outpatient Medications  Medication Sig Dispense Refill  . apixaban (ELIQUIS) 5 MG TABS tablet Take 1 tablet (5 mg total) by mouth 2 (two) times daily. 60 tablet 6  . atorvastatin (LIPITOR) 80 MG tablet Take 1 tablet (80 mg total) by mouth daily. 90 tablet 3  . carvedilol (COREG) 25 MG tablet Take 1 tablet (25 mg total) by mouth 2 (two) times daily with a meal. MUST KEEP APPOINTMENT 05/15/19 WITH DR Haniya Fern FOR FUTURE REFILLS 180 tablet 0  . Empagliflozin-metFORMIN HCl ER (SYNJARDY XR) 03-999 MG TB24 Take 1 tablet by mouth daily. Pt needs appt 30 tablet 0  . ENTRESTO 97-103 MG Take 1 tablet by mouth twice daily 60 tablet 4  . furosemide (LASIX) 40 MG tablet Take 1 tablet (40 mg total) by mouth daily. 60 tablet 2  . sennosides-docusate sodium (SENOKOT-S) 8.6-50 MG tablet Take 1 tablet by mouth daily as needed. For constipation    . sildenafil (VIAGRA) 100 MG tablet TAKE 1 TABLET BY MOUTH AS NEEDED FOR  ERECTILE  DYSFUNCTION 10 tablet 0  . spironolactone (ALDACTONE) 25 MG tablet Take 0.5 tablets (12.5 mg total) by mouth daily.  30 tablet 6   No current facility-administered medications for this visit.      Past Medical History:  Diagnosis Date  . BPH (benign prostatic hyperplasia)   . CHF (congestive heart failure) ([place])   . Diabetes mellitus ([place])    Type II. Diet controlled  . Family history of breast cancer   . Family history of prostate cancer   . Gout   . Hyperlipidemia   . Hypertension   . Nonischemic cardiomyopathy ([place])    a. s/p STJ CRTD  . Paroxysmal atrial fibrillation ([place]) 12/25/15  . Prostate cancer [facility])     Past Surgical History:  Procedure Laterality Date  . CARDIAC DEFIBRILLATOR PLACEMENT  Jan 2014   SJM Quadra Assura implanted by Dr [name] in [place] History   Socioeconomic History  . Marital status: Widowed    Spouse name: Not on file  . Number of children: 1  . Years of education: masters  . Highest education level: Master's degree (e.g., MA, MS, MEng, MEd, MSW, MBA)  Occupational History  . Occupation: retired    Comment: [employer]  Social Needs  . Financial resource strain: Not hard at all  . Food insecurity    Worry: Never true    Inability: Never true  . Transportation needs    Medical: No    Non-medical: No  Tobacco Use  . Smoking status: Never Smoker  . Smokeless tobacco: Never Used  Substance and Sexual Activity  . Alcohol use: Yes  Comment: Occasional  . Drug use: No  . Sexual activity: Yes  Lifestyle  . Physical activity    Days per week: 3 days    Minutes per session: 10 min  . Stress: Not at all  Relationships  . Social connections    Talks on phone: More than three times a week    Gets together: Three times a week    Attends religious service: Never    Active member of club or organization: No    Attends meetings of clubs or organizations: Never    Relationship status: Widowed  . Intimate partner violence    Fear of current or ex partner: No    Emotionally abused: No    Physically abused: No    Forced sexual  activity: No  Other Topics Concern  . Not on file  Social History Narrative   Previously lived in [place] before moving to [place].  He has recently relocated to [facility]. 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    Family History  Problem Relation Age of Onset  . Diabetes Mother   . Hyperlipidemia Mother   . Heart attack Brother   . Breast cancer Sister 64  . Prostate cancer Maternal Uncle   . Prostate cancer Cousin        [name] vet, agent orange exposure  . Prostate cancer Cousin        dx 28-30 yrs. - mat first cousin's son  . Prostate cancer Cousin   . Prostate cancer Cousin   . Diabetes Brother   . Throat cancer Brother 37       [place] vet, ? due to agent orange exposure    ROS: no fevers or chills, productive cough, hemoptysis, dysphasia, odynophagia, melena, hematochezia, dysuria, hematuria, rash, seizure activity, orthopnea, PND, pedal edema, claudication. Remaining systems are negative.  Physical Exam: Well-developed well-nourished in no acute distress.  Skin is warm and dry.  HEENT is normal.  Neck is supple.  Chest is clear to auscultation with normal expansion.  Cardiovascular exam is regular rate and rhythm.  Abdominal exam nontender or distended. No masses palpated. Extremities show no edema. neuro grossly intact  ECG- personally reviewed  A/P  1 paroxysmal atrial fibrillation-patient has a history of atrial fibrillation after interrogating his device.  We will continue carvedilol for rate control.  Continue apixaban.  Check hemoglobin and renal function.  2 presumed coronary artery disease-this is based on a previous nuclear study showing infarct but no ischemia.  At last office visit we discussed proceeding with cardiac catheterization to exclude coronary disease as a cause of his cardiomyopathy.  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  Check potassium and renal function.  4 chronic systolic congestive heart  failure-plan to continue Lasix and spironolactone.  Check potassium and renal function.  Continue fluid restriction and low-sodium diet.  5 hypertension-blood pressure controlled.  Continue present medications.  6 prior ICD-followed by electrophysiology.  7 hyperlipidemia-continue statin.  Kirk Ruths, MD

## 2019-05-15 ENCOUNTER — Ambulatory Visit: Payer: Medicare Other | Admitting: Cardiology

## 2019-05-16 ENCOUNTER — Encounter: Payer: Self-pay | Admitting: Physician Assistant

## 2019-05-16 ENCOUNTER — Ambulatory Visit (INDEPENDENT_AMBULATORY_CARE_PROVIDER_SITE_OTHER): Payer: Medicare Other | Admitting: Physician Assistant

## 2019-05-16 ENCOUNTER — Other Ambulatory Visit: Payer: Self-pay

## 2019-05-16 VITALS — BP 140/74 | HR 60 | Ht 70.0 in | Wt 244.0 lb

## 2019-05-16 DIAGNOSIS — I420 Dilated cardiomyopathy: Secondary | ICD-10-CM | POA: Diagnosis not present

## 2019-05-16 DIAGNOSIS — I255 Ischemic cardiomyopathy: Secondary | ICD-10-CM | POA: Diagnosis not present

## 2019-05-16 DIAGNOSIS — I10 Essential (primary) hypertension: Secondary | ICD-10-CM

## 2019-05-16 DIAGNOSIS — I5023 Acute on chronic systolic (congestive) heart failure: Secondary | ICD-10-CM | POA: Diagnosis not present

## 2019-05-16 DIAGNOSIS — M545 Low back pain, unspecified: Secondary | ICD-10-CM

## 2019-05-16 DIAGNOSIS — Z23 Encounter for immunization: Secondary | ICD-10-CM | POA: Diagnosis not present

## 2019-05-16 DIAGNOSIS — E1122 Type 2 diabetes mellitus with diabetic chronic kidney disease: Secondary | ICD-10-CM | POA: Diagnosis not present

## 2019-05-16 LAB — POCT GLYCOSYLATED HEMOGLOBIN (HGB A1C): Hemoglobin A1C: 7.3 % — AB (ref 4.0–5.6)

## 2019-05-16 MED ORDER — FUROSEMIDE 40 MG PO TABS: 40.0000 mg | ORAL_TABLET | Freq: Every day | ORAL | 2 refills | Status: DC

## 2019-05-16 MED ORDER — TRULICITY 0.75 MG/0.5ML ~~LOC~~ SOAJ: 0.7500 mg | SUBCUTANEOUS | 2 refills | Status: DC

## 2019-05-16 MED ORDER — SYNJARDY XR 10-1000 MG PO TB24: 1.0000 | ORAL_TABLET | Freq: Every day | ORAL | 2 refills | Status: DC

## 2019-05-16 NOTE — Patient Instructions (Signed)
Nonallergic Rhinitis Nonallergic rhinitis is a condition that causes symptoms that affect the nose, such as a runny nose and a stuffed-up nose (nasal congestion) that can make it hard to breathe through the nose. This condition is different from having an allergy (allergic rhinitis). Allergic rhinitis occurs when the body's defense system (immune system) reacts to a substance that you are allergic to (allergen), such as pollen, pet dander, mold, or dust. Nonallergic rhinitis has many similar symptoms, but it is not caused by allergens. Nonallergic rhinitis can be a short-term or long-term problem. What are the causes? This condition can be caused by many different things. Some common types of nonallergic rhinitis include: Infectious rhinitis  This is usually due to an infection in the upper respiratory tract. Vasomotor rhinitis  This is the most common type of long-term nonallergic rhinitis.  It is caused by too much blood flow through the nose, which makes the tissue inside of the nose swell.  Symptoms are often triggered by strong odors, cold air, stress, drinking alcohol, cigarette smoke, or changes in the weather. Occupational rhinitis  This type is caused by triggers in the workplace, such as chemicals, dusts, animal dander, or air pollution. Hormonal rhinitis  This type occurs in women as a result of an increase in the male hormone estrogen.  It may occur during pregnancy, puberty, and menstrual cycles.  Symptoms improve when estrogen levels drop. Drug-induced rhinitis Several drugs can cause nonallergic rhinitis, including:  Medicines that are used to treat high blood pressure, heart disease, and Parkinson disease.  Aspirin and NSAIDs.  Over-the-counter nasal decongestant sprays. These can cause a type of nonallergic rhinitis (rhinitis medicamentosa) when they are used for more than a few days. Nonallergic rhinitis with eosinophilia syndrome (NARES)  This type is caused by  having too much of a certain type of white blood cell (eosinophil). Nonallergic rhinitis can also be caused by a reaction to eating hot or spicy foods. This does not usually cause long-term symptoms. In some cases, the cause of nonallergic rhinitis is not known. What increases the risk? You are more likely to develop this condition if:  You are 73-37 years of age.  You are a woman. Women are twice as likely to have this condition. What are the signs or symptoms? Common symptoms of this condition include:  Nasal congestion.  Runny nose.  The feeling of mucus going down the back of the throat (postnasal drip).  Trouble sleeping at night and daytime sleepiness. Less common symptoms include:  Sneezing.  Coughing.  Itchy nose.  Bloodshot eyes. How is this diagnosed? This condition may be diagnosed based on:  Your symptoms and medical history.  A physical exam.  Allergy testing to rule out allergic rhinitis. You may have skin tests or blood tests. In some cases, the health care provider may take a swab of nasal secretions to look for an increased number of eosinophils. This would be done to confirm a diagnosis of NARES. How is this treated? Treatment for this condition depends on the cause. No single treatment works for everyone. Work with your health care provider to find the best treatment for you. Treatment may include:  Avoiding the things that trigger your symptoms.  Using medicines to relieve congestion, such as: ? Steroid nasal spray. There are many types. You may need to try a few to find out which one works best. ? Decongestant medicine. This may be an oral medicine or a nasal spray. These medicines are only used for  a short time.  Using medicines to relieve a runny nose. These may include antihistamine medicines or anticholinergic nasal sprays.  Surgery to remove tissue from inside the nose may be needed in severe cases if the condition has not improved after 6-12  months of medical treatment. Follow these instructions at home:  Take or use over-the-counter and prescription medicines only as told by your health care provider. Do not stop using your medicine even if you start to feel better.  Use salt-water (saline) rinses or other solutions (nasal washes or irrigations) to wash or rinse out the inside of your nose as told by your health care provider.  Do not take NSAIDs or medicines that contain aspirin if they make your symptoms worse.  Do not drink alcohol if it makes your symptoms worse.  Do not use any tobacco products, such as cigarettes, chewing tobacco, and e-cigarettes. If you need help quitting, ask your health care provider.  Avoid secondhand smoke.  Get some exercise every day. Exercise may help reduce symptoms of nonallergic rhinitis for some people. Ask your health care provider how much exercise and what types of exercise are safe for you.  Sleep with the head of your bed raised (elevated). This may reduce nighttime nasal congestion.  Keep all follow-up visits as told by your health care provider. This is important. Contact a health care provider if:  You have a fever.  Your symptoms are getting worse at home.  Your symptoms are not responding to medicine.  You develop new symptoms, especially a headache or nosebleed. This information is not intended to replace advice given to you by your health care provider. Make sure you discuss any questions you have with your health care provider. Document Released: 10/07/2015 Document Revised: 05/28/2017 Document Reviewed: 09/05/2015 Elsevier Patient Education  2020 Elsevier Inc.  

## 2019-05-16 NOTE — Progress Notes (Addendum)
Subjective:     Patient ID: Earl Hatcher Sr., male   DOB: 07-Jan-1941, 78 y.o.   MRN: [id number]  HPI  Earl White is a 78 yo male with type 2 diabetes, cardiomyopathy, chronic heart failure and afib who presents for a 6 month follow up appointment.   Pt does not check blood glucose at home. He admits to a recent increase of sodium in his diet. He is active around the house and yard. Pt denies any  wounds or open sores on lower extremities. denies change in vision. He takes synjardy qd. He denies any hypoglycemic.   Pt is managed by cardiology. Denies chest pain, shortness of breath or extremity swelling. He is taking BP medications and compliant.   Pt admits to left lower back pain x5 days last week which was worse in the morning and better with movement. States the pain has since improved  .[name] Active Ambulatory Problems    Diagnosis Date Noted  . Essential hypertension, benign 04/28/2013  . BPH (benign prostatic hyperplasia) 04/28/2013  . Gout 04/28/2013  . Congestive dilated cardiomyopathy ([place]) 05/10/2013  . ICD (implantable cardioverter-defibrillator) in place 05/10/2013  . Elevated PSA 11/24/2013  . Systolic dysfunction with acute on chronic heart failure ([place]) 09/03/2014  . Morbid obesity ([place]) 09/03/2014  . Erectile dysfunction 03/10/2015  . Lower extremity edema 09/29/2017  . Abnormal ultrasound of lower extremity 09/29/2017  . Malignant neoplasm of prostate ([place]) 02/03/2018  . Family history of cancer 02/03/2018  . Family history of prostate cancer   . Family history of breast cancer   . Genetic testing 03/25/2018  . Hypotension 05/23/2018  . Paroxysmal atrial fibrillation ([place]) 12/25/2015  . History of gout 08/01/2018  . Type 2 diabetes mellitus with chronic kidney disease, 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[place]) 08/01/2018  . Acute left-sided low back pain without sciatica 05/16/2019   Resolved Ambulatory Problems    Diagnosis Date Noted  . Diabetes  mellitus due to underlying condition, controlled, without complication, 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[place]) 04/28/2013   Past Medical History:  Diagnosis Date  . CHF (congestive heart failure) ([place])   . Diabetes mellitus ([place])   . Hyperlipidemia   . Hypertension   . Nonischemic cardiomyopathy ([place])   . Prostate cancer ([place])      ROS See HPI    Objective:   Vital signs: Vitals signs reviewed.  Constitutional:      Appearance: Normal appearance.  HENT:     Head: Normocephalic.  Cardiovascular:     Rate and Rhythm: Irregular rhythm.      Pulses: Normal pulses.     Heart sounds: Murmur present.  Pulmonary:     Effort: Pulmonary effort is normal.     Breath sounds: Normal breath sounds. No wheezing or rhonchi.  Musculoskeletal:     Right lower leg: trace edema.    Left lower leg: trace edema. Neurological:     General: No focal deficit present.     Mental Status: He is alert and oriented to person, place, and time.  Psychiatric:        Mood and Affect: Mood normal.      Assessment and Plan  .[name] KitchenErice was seen today for diabetes.  Diagnoses and all orders for this visit:  Type 2 diabetes mellitus with chronic kidney disease, without long-term current use of insulin, unspecified CKD stage (HCC) -     POCT glycosylated hemoglobin (Hb A1C) -     Empagliflozin-metFORMIN HCl ER [facility]  XR) 03-999 MG TB24; Take 1 tablet by mouth daily. -     Ambulatory referral to Ophthalmology  Flu vaccine need -     HIGH DOSE FLU  Systolic dysfunction with acute on chronic heart failure (HCC) -     furosemide (LASIX) 40 MG tablet; Take 1 tablet (40 mg total) by mouth daily.  Congestive dilated cardiomyopathy (HCC) -     furosemide (LASIX) 40 MG tablet; Take 1 tablet (40 mg total) by mouth daily.  Essential hypertension, benign -     furosemide (LASIX) 40 MG tablet; Take 1 tablet (40 mg total) by mouth daily.  Acute left-sided low back pain without sciatica  Other  orders -     Dulaglutide (TRULICITY) A999333 0000000 SOPN; Inject 0.75 mg into the skin once a week.   A1C up today.  Continue on synjardy.  Added trulicity. Discussed side effects and how to use in office. This could also reduce CV risk.  On STATIN. On ACE. BP not to goal today. Pt admitted to increasing salt. He will do better in next 3 months.  Flu shot given.  UTD pneumonia vaccine.  Needs eye exam. Referral made today.    Discussed the importance of sodium restriction for his overall health and elevated blood pressure.  Follow up with cardiology regularly for BP medications.   Low back pain has resolved. Likely muscular. Follow up as needed.   Follow up in 3 months.     Vernetta Honey PA-C, have reviewed and agree with the above documentation in it's entirety.

## 2019-05-22 NOTE — Progress Notes (Signed)
Subjective:   Earl Hosbach Sr. is a 78 y.o. male who presents for Medicare Annual/Subsequent preventive examination.  Review of Systems:  No ROS.  Medicare Wellness Virtual Visit.  Visual/audio telehealth visit, UTA vital signs.   See social history for additional risk factors.  Patient passed out during visit and got him back to conciousness and an EKG was ran and given to [facility] for review. No complaints of chest pain, heart palpitations- does have a pace maker in. Orders given to get lab work done and given a cup of water to drink and recheck on vitals before release. KG LPN Patient released per Luvenia Starch to go home and wellness visit stopped due to syncope. KG LPN  Cardiac Risk Factors include: advanced age (>71men, >72 women);male gender  Sleep patterns:  Getting 5-6 hours of sleep a night. Wakes up 1 time during the night to void. Wakes up and feels rested at times and sluggish at the others.  Home Safety/Smoke Alarms: Feels safe in home. Smoke alarms in place.  Living environment; Lives alone in a 2 story home and stairs have hand rails on them. Seat Belt Safety/Bike Helmet: Wears seat belt.  Male:   CCS- Aged out    PSA- UTD Lab Results  Component Value Date   PSA 9.1 (H) 09/29/2017   PSA 6.3 (H) 07/03/2016   PSA 5.32 (H) 11/24/2013        Objective:    Vitals: There were no vitals taken for this visit.  There is no height or weight on file to calculate BMI.  Advanced Directives 05/23/2019 05/17/2018  Does Patient Have a Medical Advance Directive? No Yes  Type of Advance Directive - Living will  Does patient want to make changes to medical advance directive? - No - Patient declined  Would patient like information on creating a medical advance directive? Yes (MAU/Ambulatory/Procedural Areas - Information given) -    Tobacco Social History   Tobacco Use  Smoking Status Never Smoker  Smokeless Tobacco Never Used     Counseling given: Not Answered   Clinical  Intake:  Pre-visit preparation completed: Yes  Pain : No/denies pain     Nutritional Risks: None Diabetes: Yes CBG done?: No Did pt. bring in CBG monitor from home?: No  How often do you need to have someone help you when you read instructions, pamphlets, or other written materials from your doctor or pharmacy?: 1 - Never What is the last grade level you completed in school?: 16  Interpreter Needed?: No  Information entered by :: Orlie Dakin, LPN  Past Medical History:  Diagnosis Date  . BPH (benign prostatic hyperplasia)   . CHF (congestive heart failure) ([place])   . Diabetes mellitus ([place])    Type II. Diet controlled  . Family history of breast cancer   . Family history of prostate cancer   . Gout   . Hyperlipidemia   . Hypertension   . Nonischemic cardiomyopathy ([place])    a. s/p STJ CRTD  . Paroxysmal atrial fibrillation ([place]) 12/25/15  . Prostate cancer [facility])    Past Surgical History:  Procedure Laterality Date  . CARDIAC DEFIBRILLATOR PLACEMENT  Jan 2014   SJM Quadra Assura implanted by Dr [name] in [place]   Family History  Problem Relation Age of Onset  . Diabetes Mother   . Hyperlipidemia Mother   . Heart attack Brother   . Breast cancer Sister 39  . Prostate cancer Maternal Uncle   . Prostate  cancer Cousin        [name] vet, agent orange exposure  . Prostate cancer Cousin        dx 28-30 yrs. - mat first cousin's son  . Prostate cancer Cousin   . Prostate cancer Cousin   . Diabetes Brother   . Throat cancer Brother 20       [place], ? due to agent orange exposure   Social History   Socioeconomic History  . Marital status: Widowed    Spouse name: Not on file  . Number of children: 1  . Years of education: masters  . Highest education level: Master's degree (e.g., MA, MS, MEng, MEd, MSW, MBA)  Occupational History  . Occupation: retired    Comment: [employer]  Social Needs  . Financial resource strain: Not hard at all  .  Food insecurity    Worry: Never true    Inability: Never true  . Transportation needs    Medical: No    Non-medical: No  Tobacco Use  . Smoking status: Never Smoker  . Smokeless tobacco: Never Used  Substance and Sexual Activity  . Alcohol use: Yes    Comment: Occasional  . Drug use: No  . Sexual activity: Yes  Lifestyle  . Physical activity    Days per week: 4 days    Minutes per session: 40 min  . Stress: Not at all  Relationships  . Social connections    Talks on phone: More than three times a week    Gets together: Never    Attends religious service: Never    Active member of club or organization: No    Attends meetings of clubs or organizations: Never    Relationship status: Widowed  Other Topics Concern  . Not on file  Social History Narrative   Previously lived in [place] before moving to [place].  He has recently relocated to [facility]. 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    Outpatient Encounter Medications as of 05/23/2019  Medication Sig  . apixaban (ELIQUIS) 5 MG TABS tablet Take 1 tablet (5 mg total) by mouth 2 (two) times daily.  [name] atorvastatin (LIPITOR) 80 MG tablet Take 1 tablet (80 mg total) by mouth daily.  . carvedilol (COREG) 25 MG tablet Take 1 tablet (25 mg total) by mouth 2 (two) times daily with a meal. MUST KEEP APPOINTMENT 05/15/19 WITH DR [name] FOR FUTURE REFILLS  . Dulaglutide (TRULICITY) A999333 0000000 SOPN Inject 0.75 mg into the skin once a week.  . Empagliflozin-metFORMIN HCl ER (SYNJARDY XR) 03-999 MG TB24 Take 1 tablet by mouth daily.  [name] ENTRESTO 97-103 MG Take 1 tablet by mouth twice daily  . furosemide (LASIX) 40 MG tablet Take 1 tablet (40 mg total) by mouth daily.  . sennosides-docusate sodium (SENOKOT-S) 8.6-50 MG tablet Take 1 tablet by mouth daily as needed. For constipation  . sildenafil (VIAGRA) 100 MG tablet TAKE 1 TABLET BY MOUTH AS NEEDED FOR  ERECTILE  DYSFUNCTION  . spironolactone (ALDACTONE) 25 MG tablet Take 0.5  tablets (12.5 mg total) by mouth daily.   No facility-administered encounter medications on file as of 05/23/2019.     Activities of Daily Living In your present state of health, do you have any difficulty performing the following activities: 05/23/2019  Hearing? N  Vision? N  Difficulty concentrating or making decisions? N  Walking or climbing stairs? Y  Comment gets a little short of breath  Dressing or bathing? N  Doing errands, shopping? N  Preparing Food and eating ? N  Using the Toilet? N  In the past six months, have you accidently leaked urine? N  Do you have problems with loss of bowel control? N  Managing your Medications? N  Managing your Finances? N  Housekeeping or managing your Housekeeping? N  Some recent data might be hidden    Patient Care Team: Lavada Mesi as PCP - General (Family Medicine) [name] Denice Bors, MD as PCP - Cardiology (Cardiology) Patsey Berthold, NP as Nurse Practitioner (Cardiology)   Assessment:   This is a routine wellness examination for [place].Physical assessment deferred to PCP.   Exercise Activities and Dietary recommendations Current Exercise Habits: Home exercise routine, Type of exercise: strength training/weights;walking(stationary bike), Time (Minutes): 40, Frequency (Times/Week): 4, Weekly Exercise (Minutes/Week): 160, Intensity: Moderate Diet  Breakfast: Lunch:  Dinner:       Goals   None     Fall Risk Fall Risk  05/23/2019 05/16/2019 05/17/2018 01/31/2018 06/03/2017  Falls in the past year? 0 0 0 No No  Comment - - - - [place]i Telephone Survey: data to providers prior to load  Number falls in past yr: 0 0 - - -  Injury with Fall? 0 0 - - -  Follow up Falls prevention discussed Falls evaluation completed - - -   Is the patient's home free of loose throw rugs in walkways, pet beds, electrical cords, etc?   yes      Grab bars in the bathroom? no      Handrails on the stairs?   yes      Adequate lighting?    yes   Depression Screen PHQ 2/9 Scores 05/16/2019 05/17/2018 01/31/2018 03/05/2017  PHQ - 2 Score 1 0 0 0  PHQ- 9 Score 3 - - -    Cognitive Function     6CIT Screen 05/17/2018  What Year? 0 points  What month? 0 points  What time? 0 points  Count back from 20 0 points  Months in reverse 0 points  Repeat phrase 2 points  Total Score 2    Immunization History  Administered Date(s) Administered  . Fluad Quad(high Dose 65+) 05/16/2019  . Pneumococcal Conjugate-13 08/07/2015  . Pneumococcal Polysaccharide-23 08/26/2018  . Tdap 11/02/2014    Screening Tests Health Maintenance  Topic Date Due  . OPHTHALMOLOGY EXAM  10/28/1950  . URINE MICROALBUMIN  07/28/2019  . FOOT EXAM  07/30/2019  . HEMOGLOBIN A1C  11/13/2019  . TETANUS/TDAP  11/01/2024  . INFLUENZA VACCINE  Completed  . PNA vac Low Risk Adult  Completed        Plan:     I have personally reviewed and noted the following in the patient's chart:   . Medical and social history . Use of alcohol, tobacco or illicit drugs  . Current medications and supplements . Functional ability and status . Nutritional status . Physical activity . Advanced directives . List of other physicians . Hospitalizations, surgeries, and ER visits in previous 12 months . Vitals . Screenings to include cognitive, depression, and falls . Referrals and appointments  In addition, I have reviewed and discussed with patient certain preventive protocols, quality metrics, and best practice recommendations. A written personalized care plan for preventive services as well as general preventive health recommendations were provided to patient.    Exam was stopped early due to syncope episode.   Joanne Chars, LPN  [phone]

## 2019-05-23 ENCOUNTER — Ambulatory Visit (INDEPENDENT_AMBULATORY_CARE_PROVIDER_SITE_OTHER): Payer: Medicare Other | Admitting: Physician Assistant

## 2019-05-23 VITALS — BP 126/64 | HR 77 | Temp 98.2°F | Ht 70.0 in | Wt 243.0 lb

## 2019-05-23 DIAGNOSIS — R55 Syncope and collapse: Secondary | ICD-10-CM | POA: Diagnosis not present

## 2019-05-23 DIAGNOSIS — I255 Ischemic cardiomyopathy: Secondary | ICD-10-CM | POA: Diagnosis not present

## 2019-05-23 DIAGNOSIS — I480 Paroxysmal atrial fibrillation: Secondary | ICD-10-CM | POA: Diagnosis not present

## 2019-05-24 ENCOUNTER — Other Ambulatory Visit: Payer: Self-pay

## 2019-05-24 ENCOUNTER — Telehealth: Payer: Self-pay | Admitting: Emergency Medicine

## 2019-05-24 ENCOUNTER — Encounter: Payer: Self-pay | Admitting: Physician Assistant

## 2019-05-24 DIAGNOSIS — R55 Syncope and collapse: Secondary | ICD-10-CM | POA: Insufficient documentation

## 2019-05-24 LAB — COMPLETE METABOLIC PANEL WITH GFR
AG Ratio: 1.6 (calc) (ref 1.0–2.5)
ALT: 20 U/L (ref 9–46)
AST: 24 U/L (ref 10–35)
Albumin: 4.4 g/dL (ref 3.6–5.1)
Alkaline phosphatase (APISO): 78 U/L (ref 35–144)
BUN/Creatinine Ratio: 17 (calc) (ref 6–22)
BUN: 21 mg/dL (ref 7–25)
CO2: 26 mmol/L (ref 20–32)
Calcium: 9.9 mg/dL (ref 8.6–10.3)
Chloride: 108 mmol/L (ref 98–110)
Creat: 1.22 mg/dL — ABNORMAL HIGH (ref 0.70–1.18)
GFR, Est African American: 65 mL/min/{1.73_m2} (ref >=60)
GFR, Est Non African American: 56 mL/min/{1.73_m2} — ABNORMAL LOW (ref >=60)
Globulin: 2.8 g/dL (calc) (ref 1.9–3.7)
Glucose, Bld: 120 mg/dL — ABNORMAL HIGH (ref 65–99)
Potassium: 4.3 mmol/L (ref 3.5–5.3)
Sodium: 145 mmol/L (ref 135–146)
Total Bilirubin: 0.7 mg/dL (ref 0.2–1.2)
Total Protein: 7.2 g/dL (ref 6.1–8.1)

## 2019-05-24 LAB — CBC
HCT: 48.8 % (ref 38.5–50.0)
Hemoglobin: 16.3 g/dL (ref 13.2–17.1)
MCH: 30.4 pg (ref 27.0–33.0)
MCHC: 33.4 g/dL (ref 32.0–36.0)
MCV: 90.9 fL (ref 80.0–100.0)
MPV: 11.4 fL (ref 7.5–12.5)
Platelets: 217 10*3/uL (ref 140–400)
RBC: 5.37 10*6/uL (ref 4.20–5.80)
RDW: 14.8 % (ref 11.0–15.0)
WBC: 6.7 10*3/uL (ref 3.8–10.8)

## 2019-05-24 NOTE — Telephone Encounter (Addendum)
Patient contacted due to alerts received for multiple episodes of SVT, VT   that were treated between 1332 to 1407. He reports he felt dizzy for a few seconds while getting into his car to go to a  health assessment appointment at his PCP office around 1330. He reports no CP, SOB, palpitations or dizziness after that. He reports feeling hot and asked for the heat to be turned down in the PCP office. He then had a syncopal episode around 1400 during the visit with the LPN and was evaluated after the episode by the PCP.At 1354 he received successful ATP x1 for VT. At 1407 the event started as 1:1  SVT until transitioned into VT , self terminated, 1:1 SVT resumed. Device redetected as VT   then initiated therapies- ATP X 3 which  accelerated the SVT into VT in  the VF zone and was successfully terminated by 1 shock @ 845 V. He reports not feeling the shock and a few minutes after syncope  felt no symptoms. EKG , and labs were done and patient was allowed to drive home.  Reviewed shock plan and ED precautions with patient, as well as [place] DMV driving restrictions due to treatment by device.

## 2019-05-24 NOTE — Telephone Encounter (Signed)
Opened in error

## 2019-05-24 NOTE — Progress Notes (Signed)
Labs stable and look good. At least drink 32 oz of fluid a day and no more than 64oz.

## 2019-05-24 NOTE — Telephone Encounter (Signed)
LMOM that Dr Rayann Heman would like to see patient in office on 06/08/19. Industry to be present to look at discriminators and possible reprogramming.

## 2019-05-24 NOTE — Progress Notes (Signed)
   Subjective:    Patient ID: Earl Hatcher Sr., male    DOB: Nov 12, 1940, 78 y.o.   MRN: [id number]  HPI Patient is a 78 year old obese male who presented to the clinic for a Medicare wellness exam.  During the exam.  Per nurse in the middle of answering questions his eyes rolled in the back of his head and his body collapse.  She immediately ran to him and he was easily aroused within 5 to 10 seconds.  Nurse came and got me after he was aroused and stable.  Patient does have a rather significant cardiovascular history with congestive dilated cardiomyopathy, hypertension, paroxysmal atrial fibrillation.  He also has ongoing diabetes that is controlled.  Patient has a pacemaker and defibrillator in place.  There is no evidence of defibrillator was activated.  Patient to sitting in the garage a little too long while his car was running and smelling a lot of gas.  He also admits when he got to the clinic feeling thirsty.  When he got into the room there was a heater on for the nurse and he also felt a little hot.  He denies any chest pains, palpitations, headaches, vision changes, swelling.  He has only had 1 cup of water and 1 cup of coffee this morning.   Review of Systems    See HPI.  Objective:   Physical Exam Vitals signs reviewed.  Constitutional:      Appearance: Normal appearance. He is obese.  Cardiovascular:     Rate and Rhythm: Normal rate and regular rhythm.  Pulmonary:     Effort: Pulmonary effort is normal.     Breath sounds: Normal breath sounds. No wheezing or rhonchi.  Musculoskeletal:     Right lower leg: No edema.     Left lower leg: No edema.  Skin:    Findings: No erythema or rash.  Neurological:     General: No focal deficit present.     Mental Status: He is alert and oriented to person, place, and time.  Psychiatric:        Mood and Affect: Mood normal.           Assessment & Plan:  [name] KitchenMarland KitchenJak was seen today for [employer].  Diagnoses and all  orders for this visit:  Syncope and collapse -     CBC -     COMPLETE METABOLIC PANEL WITH GFR  By the time I got to the patient he had improved a lot.  He was oriented.  He continues to feel a little clammy.  Vitals were checked at that time and unremarkable.   Unclear etiology of syncope.  He has not drank a lot today and he is on diuretics.  He could be a little dry.  We will check CBC and CMP.  We did have him drink a cup of water in office he did feel better.  No signs of infection. No signs of fluid overload. No evidence of defibrillation activated.   EKG- no acute changes. Pacemaker is working.   Pts vitals were checked again and look great. Pt remains asymptomatic and feels "great".   Reviewed plan with Dr. Madilyn Fireman supervising physician and agrees with plan to let him leave and drive. Seems likely it was overheated and maybe some dehydration. meds stay the same for now. Make sure drinking and eating regularly. If having any dizziness, CP, palpitations, headaches, weakness go to ER.

## 2019-05-24 NOTE — Telephone Encounter (Signed)
Appears to have had SVT with inappropriate therapy.  Continue coreg.  Labs reviewed  Follow-up with me on my next DOD day for discriminator reprogramming.  Thompson Grayer MD, [place] 05/24/2019 1:44 PM

## 2019-05-30 NOTE — Telephone Encounter (Signed)
Pt scheduled for appointment 06/08/19 at 11:00 with Dr. Rayann Heman. Industry aware and will be present.

## 2019-05-31 NOTE — Addendum Note (Signed)
Addended by: Clemetine Marker A on: 05/31/2019 10:13 AM   Modules accepted: Orders

## 2019-06-02 ENCOUNTER — Ambulatory Visit (INDEPENDENT_AMBULATORY_CARE_PROVIDER_SITE_OTHER): Payer: Medicare Other

## 2019-06-02 DIAGNOSIS — Z95810 Presence of automatic (implantable) cardiac defibrillator: Secondary | ICD-10-CM | POA: Diagnosis not present

## 2019-06-02 DIAGNOSIS — I5022 Chronic systolic (congestive) heart failure: Secondary | ICD-10-CM

## 2019-06-02 NOTE — Progress Notes (Signed)
EPIC Encounter for ICM Monitoring  Patient Name: Earl Fick Sr. is a 78 y.o. male Date: 06/02/2019 Primary Care Physican: Lavada Mesi Primary Cardiologist:Crenshaw Electrophysiologist: Allred Bi-V Pacing: 94% 9/8/2020Weight:232lbs   Spoke with patient. Discussed low salt diet and advised to limit to 2000 mg daily. Encouraged to review food labels for salt content  CorVue thoracic impedancenormal but suggestive of possible fluid accumulation from 11/22 - 12/1.  Prescribed:Furosemide 40 mg1tablet (40 mg total) daily.  Labs: 12/02/2018 Creatinine 1.26, BUN 24, Potassium 4.7, Sodium 142, GFR 54-63 07/27/2018 Creatinine 1.30, BUN 28, Potassium 4.5, Sodium 143 07/13/2018 Creatinine 1.37, BUN 24, Potassium 4.8, Sodium 142  Recommendations: Recommendation to limit salt intake to 2000 mg daily and fluid intake to 64 oz daily.  Encouraged to call if experiencing any fluid symptoms.   Follow-up plan: ICM clinic phone appointment on 07/03/2019.   91 day device clinic remote transmission 06/22/2019.  Office appt  06/08/2019 with Dr. Rayann Heman.    Copy of ICM check sent to Dr. Rayann Heman.   3 month ICM trend: 06/02/2019    1 Year ICM trend:       Rosalene Billings, RN 06/02/2019 4:50 PM

## 2019-06-08 ENCOUNTER — Other Ambulatory Visit: Payer: Self-pay

## 2019-06-08 ENCOUNTER — Ambulatory Visit (INDEPENDENT_AMBULATORY_CARE_PROVIDER_SITE_OTHER): Payer: Medicare Other | Admitting: Internal Medicine

## 2019-06-08 ENCOUNTER — Encounter: Payer: Self-pay | Admitting: Internal Medicine

## 2019-06-08 VITALS — BP 134/70 | HR 50 | Ht 70.0 in | Wt 244.0 lb

## 2019-06-08 DIAGNOSIS — I255 Ischemic cardiomyopathy: Secondary | ICD-10-CM | POA: Diagnosis not present

## 2019-06-08 DIAGNOSIS — I480 Paroxysmal atrial fibrillation: Secondary | ICD-10-CM | POA: Diagnosis not present

## 2019-06-08 DIAGNOSIS — I420 Dilated cardiomyopathy: Secondary | ICD-10-CM | POA: Diagnosis not present

## 2019-06-08 DIAGNOSIS — I5022 Chronic systolic (congestive) heart failure: Secondary | ICD-10-CM | POA: Insufficient documentation

## 2019-06-08 DIAGNOSIS — I471 Supraventricular tachycardia: Secondary | ICD-10-CM

## 2019-06-08 DIAGNOSIS — Z95810 Presence of automatic (implantable) cardiac defibrillator: Secondary | ICD-10-CM | POA: Diagnosis not present

## 2019-06-08 LAB — CUP PACEART INCLINIC DEVICE CHECK
Date Time Interrogation Session: 20201210183925
Implantable Lead Implant Date: 20140117
Implantable Lead Implant Date: 20140117
Implantable Lead Implant Date: 20140117
Implantable Lead Location: 753858
Implantable Lead Location: 753859
Implantable Lead Location: 753860
Implantable Pulse Generator Implant Date: 20140117
Pulse Gen Serial Number: 7070427

## 2019-06-08 NOTE — Patient Instructions (Signed)
Medication Instructions:  Your physician recommends that you continue on your current medications as directed. Please refer to the Current Medication list given to you today.  Labwork: None ordered.  Testing/Procedures: None ordered.  Follow-Up: Your physician wants you to follow-up in: one year with Dr. Rayann Heman.   You will receive a reminder letter in the mail two months in advance. If you don't receive a letter, please call our office to schedule the follow-up appointment.  Remote monitoring is used to monitor your ICD from home. This monitoring reduces the number of office visits required to check your device to one time per year. It allows [place] to keep an eye on the functioning of your device to ensure it is working properly. You are scheduled for a device check from home on 06/22/2019. You may send your transmission at any time that day. If you have a wireless device, the transmission will be sent automatically. After your physician reviews your transmission, you will receive a postcard with your next transmission date.  Any Other Special Instructions Will Be Listed Below (If Applicable).  If you need a refill on your cardiac medications before your next appointment, please call your pharmacy.

## 2019-06-08 NOTE — Progress Notes (Signed)
PCP: Donella Stade, PA-C Primary Cardiologist: Dr [name] Primary EP: Dr Rayann Heman  Earl Hatcher Sr. is a 78 y.o. male who presents today for routine electrophysiology followup.  Since last being seen in our clinic, the patient reports doing reasonably well.  He is excited about a recent Cadillac that he purchased. He is brought in today for further evaluation of a recent arrhythmia.  He had abrupt onset of tachypalpitations on 05/23/2019 at 1:53 bpm.  He reports having a chest fullness with diaphoresis and feeling "hot".  He did not have CP or SOB.  He then had syncope followed by an ICD shock.  He was evaluated by primary care.  He has done well since, without any further issues.  In retrospect, he had been noncompliant with his medicines for several days.  Today, he denies symptoms of palpitations, chest pain, shortness of breath,  lower extremity edema, dizziness, presyncope, syncope, or ICD shocks.  The patient is otherwise without complaint today.   Past Medical History:  Diagnosis Date  . BPH (benign prostatic hyperplasia)   . CHF (congestive heart failure) ([place])   . Diabetes mellitus ([place])    Type II. Diet controlled  . Family history of breast cancer   . Family history of prostate cancer   . Gout   . Hyperlipidemia   . Hypertension   . Nonischemic cardiomyopathy ([place])    a. s/p STJ CRTD  . Paroxysmal atrial fibrillation ([place]) 12/25/15  . Prostate cancer [facility])    Past Surgical History:  Procedure Laterality Date  . CARDIAC DEFIBRILLATOR PLACEMENT  Jan 2014   SJM Quadra Assura implanted by Dr [name] in [place] are reviewed and negative except as per HPI above  Current Outpatient Medications  Medication Sig Dispense Refill  . apixaban (ELIQUIS) 5 MG TABS tablet Take 1 tablet (5 mg total) by mouth 2 (two) times daily. 60 tablet 6  . atorvastatin (LIPITOR) 80 MG tablet Take 1 tablet (80 mg total) by mouth daily. 90 tablet 3  . carvedilol  (COREG) 25 MG tablet Take 1 tablet (25 mg total) by mouth 2 (two) times daily with a meal. MUST KEEP APPOINTMENT 05/15/19 WITH DR CRENSHAW FOR FUTURE REFILLS 180 tablet 0  . Dulaglutide (TRULICITY) A999333 0000000 SOPN Inject 0.75 mg into the skin once a week. 4 pen 2  . Empagliflozin-metFORMIN HCl ER (SYNJARDY XR) 03-999 MG TB24 Take 1 tablet by mouth daily. 30 tablet 2  . ENTRESTO 97-103 MG Take 1 tablet by mouth twice daily 60 tablet 4  . furosemide (LASIX) 40 MG tablet Take 1 tablet (40 mg total) by mouth daily. 60 tablet 2  . sennosides-docusate sodium (SENOKOT-S) 8.6-50 MG tablet Take 1 tablet by mouth daily as needed. For constipation    . sildenafil (VIAGRA) 100 MG tablet TAKE 1 TABLET BY MOUTH AS NEEDED FOR  ERECTILE  DYSFUNCTION 10 tablet 0  . spironolactone (ALDACTONE) 25 MG tablet Take 0.5 tablets (12.5 mg total) by mouth daily. 30 tablet 6   No current facility-administered medications for this visit.    Physical Exam: Vitals:   06/08/19 1115  BP: 134/70  Pulse: (!) 50  SpO2: 93%  Weight: 244 lb (110.7 kg)  Height: 5\' 10"  (1.778 m)    GEN- The patient is well appearing, alert and oriented x 3 today.   Head- normocephalic, atraumatic Eyes-  Sclera clear, conjunctiva pink Ears- hearing intact Oropharynx- clear Lungs-  normal work of breathing  Chest- ICD pocket is well healed Heart- Regular rate and rhythm  GI- soft  Extremities- no clubbing, cyanosis, or edema  ICD interrogation- reviewed in detail today,  See PACEART report    Wt Readings from Last 3 Encounters:  06/08/19 244 lb (110.7 kg)  05/23/19 243 lb (110.2 kg)  05/16/19 244 lb (110.7 kg)    Assessment and Plan:  1.  Chronic systolic dysfunction euvolemic today Stable on an appropriate medical regimen Normal ICD function See Pace Art report No changes today he is not device dependant today followed in ICM device clinic  2. SVT On careful review of his arrhythmia event, he appears to have had an  atrial tachycardia (as more As than Vs at times) at 160 bpm for which his ICD initially determined appropriately to be SVT.  During the event, he had transient NSVT with subsequent reclassification of his arrhythmia as VT.  Multiple ATP spins were unsuccessfully however, ATP did degenerate his arrhythmia into VF for which he was then successfully defibrillated to sinus. As he does not appear to have had prior appropriate therapy, I have raised his VT1 zone from 148->181 bpm to reduce the likelihood of inappropriate therapy.  I have encouraged compliance with home therapy.  I feel like we can hold off on consideration of ablation at this time.  3. Obesity Lifestyle modification is encouraged  4. HTN Stable No change required today  5. Paroxysmal atrial fibrillation Previously seen on ICD interrogations No episodes by interrogation today since last interrogation  Thompson Grayer MD, [facility] 06/08/2019 11:53 AM

## 2019-06-22 ENCOUNTER — Ambulatory Visit (INDEPENDENT_AMBULATORY_CARE_PROVIDER_SITE_OTHER): Payer: Medicare Other | Admitting: *Deleted

## 2019-06-22 DIAGNOSIS — I5022 Chronic systolic (congestive) heart failure: Secondary | ICD-10-CM

## 2019-06-24 LAB — CUP PACEART REMOTE DEVICE CHECK
Battery Remaining Longevity: 19 mo
Battery Remaining Percentage: 20 %
Battery Voltage: 2.77 V
Brady Statistic AP VP Percent: 92 %
Brady Statistic AP VS Percent: 5.9 %
Brady Statistic AS VP Percent: 1 %
Brady Statistic AS VS Percent: 1 %
Brady Statistic RA Percent Paced: 96 %
Date Time Interrogation Session: 20201225033505
HighPow Impedance: 45 Ohm
HighPow Impedance: 45 Ohm
Implantable Lead Implant Date: 20140117
Implantable Lead Implant Date: 20140117
Implantable Lead Implant Date: 20140117
Implantable Lead Location: 753858
Implantable Lead Location: 753859
Implantable Lead Location: 753860
Implantable Pulse Generator Implant Date: 20140117
Lead Channel Impedance Value: 480 Ohm
Lead Channel Impedance Value: 490 Ohm
Lead Channel Impedance Value: 930 Ohm
Lead Channel Pacing Threshold Amplitude: 0.75 V
Lead Channel Pacing Threshold Amplitude: 0.875 V
Lead Channel Pacing Threshold Amplitude: 1.125 V
Lead Channel Pacing Threshold Pulse Width: 0.5 ms
Lead Channel Pacing Threshold Pulse Width: 0.5 ms
Lead Channel Pacing Threshold Pulse Width: 0.5 ms
Lead Channel Sensing Intrinsic Amplitude: 11.6 mV
Lead Channel Sensing Intrinsic Amplitude: 5 mV
Lead Channel Setting Pacing Amplitude: 1.75 V
Lead Channel Setting Pacing Amplitude: 2 V
Lead Channel Setting Pacing Amplitude: 2.125
Lead Channel Setting Pacing Pulse Width: 0.5 ms
Lead Channel Setting Pacing Pulse Width: 0.5 ms
Lead Channel Setting Sensing Sensitivity: 0.5 mV
Pulse Gen Serial Number: 7070427

## 2019-06-27 ENCOUNTER — Other Ambulatory Visit: Payer: Self-pay | Admitting: Neurology

## 2019-06-27 DIAGNOSIS — E1122 Type 2 diabetes mellitus with diabetic chronic kidney disease: Secondary | ICD-10-CM

## 2019-06-27 MED ORDER — SYNJARDY XR 10-1000 MG PO TB24: 1.0000 | ORAL_TABLET | Freq: Every day | ORAL | 0 refills | Status: DC

## 2019-07-05 ENCOUNTER — Other Ambulatory Visit: Payer: Self-pay | Admitting: Cardiology

## 2019-07-10 ENCOUNTER — Other Ambulatory Visit: Payer: Self-pay | Admitting: Cardiology

## 2019-07-10 DIAGNOSIS — I5023 Acute on chronic systolic (congestive) heart failure: Secondary | ICD-10-CM

## 2019-07-10 DIAGNOSIS — I420 Dilated cardiomyopathy: Secondary | ICD-10-CM

## 2019-07-10 DIAGNOSIS — I10 Essential (primary) hypertension: Secondary | ICD-10-CM

## 2019-07-11 ENCOUNTER — Ambulatory Visit (INDEPENDENT_AMBULATORY_CARE_PROVIDER_SITE_OTHER): Payer: Medicare Other

## 2019-07-11 DIAGNOSIS — Z95810 Presence of automatic (implantable) cardiac defibrillator: Secondary | ICD-10-CM | POA: Diagnosis not present

## 2019-07-11 DIAGNOSIS — I5022 Chronic systolic (congestive) heart failure: Secondary | ICD-10-CM | POA: Diagnosis not present

## 2019-07-14 ENCOUNTER — Telehealth: Payer: Self-pay

## 2019-07-14 NOTE — Progress Notes (Signed)
EPIC Encounter for ICM Monitoring  Patient Name: Earl White. is a 79 y.o. male Date: 07/14/2019 Primary Care Physican: Lavada Mesi Primary Cardiologist:Crenshaw Electrophysiologist: Allred Bi-V Pacing: 92% 12/10/2020Weight:244lbs(office weight)   Attempted call to patient and unable to reach. Transmission reviewed.   CorVue thoracic impedance normal per updated trend viewer through 07/14/2019.  Prescribed:Furosemide 40 mg1tablet (40 mg total) daily.  Labs: 05/23/2019 Creatinine 1.22, BUN 21, Potassium 4.3, Sodium 145, GFR 56-65 12/02/2018 Creatinine 1.26, BUN 24, Potassium 4.7, Sodium 142, GFR 54-63 07/27/2018 Creatinine 1.30, BUN 28, Potassium 4.5, Sodium 143 07/13/2018 Creatinine 1.37, BUN 24, Potassium 4.8, Sodium 142  Recommendations: Unable to reach.    Follow-up plan: ICM clinic phone appointment on 08/14/2019.   91 day device clinic remote transmission 09/21/2019.    Copy of ICM check sent to Dr. Rayann Heman.   Direct Trend Viewer:   3 month ICM trend: 07/11/2019    1 Year ICM trend:       Rosalene Billings, RN 07/14/2019 3:42 PM

## 2019-07-14 NOTE — Telephone Encounter (Signed)
Remote ICM transmission received.  Attempted call to patient regarding ICM remote transmission and no answer.  

## 2019-08-03 ENCOUNTER — Telehealth: Payer: Self-pay

## 2019-08-03 NOTE — Telephone Encounter (Signed)
ICD alert received, Pt had VF episode last night around 2230, treated successfully with ATP.  At time of transmission pt in Atrial Rhythm 0548 this AM.  Pt with history of known AF on [place].  Current medications include Carvedilol 25mg  BID and Entresto 97-103mg  BID.  Pt has a history of SVT with inappropriate therapy 04/2019, device reprogrammed 06/08/2019 increasing VT zone to prevent unnecessary shocks.  Reviewed with ASeiler, NP, she advised to discuss symptoms with pt for both last night and currently.  If pt feels ok now, forward to MD for review, otherwise schedule APP visit.    Spoke with pt.  At time of VF episode last night he was carrying a heavy bag upstairs, he suddenly felt dizzy.  He recalls he was trying to get to his chair to sit down when he fell face first into chair hitting his mouth on the chair.  Pt current condition is that he feels "fair"  The only complaint he has is his mouth hurts.  No upcoming visits scheduled.    Educated pt on [facility] regulations for no driving 6 months following treated episode.  Will forward info to MD for review/ recommendations.

## 2019-08-11 DIAGNOSIS — Z23 Encounter for immunization: Secondary | ICD-10-CM | POA: Diagnosis not present

## 2019-08-14 ENCOUNTER — Ambulatory Visit (INDEPENDENT_AMBULATORY_CARE_PROVIDER_SITE_OTHER): Payer: Medicare Other

## 2019-08-14 DIAGNOSIS — I5022 Chronic systolic (congestive) heart failure: Secondary | ICD-10-CM

## 2019-08-14 DIAGNOSIS — Z95810 Presence of automatic (implantable) cardiac defibrillator: Secondary | ICD-10-CM

## 2019-08-16 ENCOUNTER — Other Ambulatory Visit: Payer: Self-pay

## 2019-08-16 ENCOUNTER — Ambulatory Visit (INDEPENDENT_AMBULATORY_CARE_PROVIDER_SITE_OTHER): Payer: Medicare Other | Admitting: Physician Assistant

## 2019-08-16 VITALS — BP 126/57 | HR 62 | Ht 70.0 in | Wt 241.0 lb

## 2019-08-16 DIAGNOSIS — I4901 Ventricular fibrillation: Secondary | ICD-10-CM | POA: Diagnosis not present

## 2019-08-16 DIAGNOSIS — L0100 Impetigo, unspecified: Secondary | ICD-10-CM

## 2019-08-16 DIAGNOSIS — E1122 Type 2 diabetes mellitus with diabetic chronic kidney disease: Secondary | ICD-10-CM

## 2019-08-16 DIAGNOSIS — W19XXXA Unspecified fall, initial encounter: Secondary | ICD-10-CM

## 2019-08-16 DIAGNOSIS — S0031XA Abrasion of nose, initial encounter: Secondary | ICD-10-CM | POA: Diagnosis not present

## 2019-08-16 DIAGNOSIS — S01511A Laceration without foreign body of lip, initial encounter: Secondary | ICD-10-CM | POA: Diagnosis not present

## 2019-08-16 DIAGNOSIS — R55 Syncope and collapse: Secondary | ICD-10-CM

## 2019-08-16 LAB — POCT GLYCOSYLATED HEMOGLOBIN (HGB A1C): Hemoglobin A1C: 7.3 % — AB (ref 4.0–5.6)

## 2019-08-16 LAB — POCT UA - MICROALBUMIN
Albumin/Creatinine Ratio, Urine, POC: <30
Creatinine, POC: 100 mg/dL
Microalbumin Ur, POC: 30 mg/L

## 2019-08-16 MED ORDER — SYNJARDY XR 10-1000 MG PO TB24: 1.0000 | ORAL_TABLET | Freq: Every day | ORAL | 0 refills | Status: DC

## 2019-08-16 MED ORDER — CLINDAMYCIN HCL 300 MG PO CAPS: 300.0000 mg | ORAL_CAPSULE | Freq: Two times a day (BID) | ORAL | 0 refills | Status: DC

## 2019-08-16 MED ORDER — TRULICITY 1.5 MG/0.5ML ~~LOC~~ SOAJ: 1.5000 mg | SUBCUTANEOUS | 2 refills | Status: DC

## 2019-08-16 NOTE — Progress Notes (Signed)
Subjective:    Patient ID: Earl Hatcher Sr., male    DOB: [birth year], 79 y.o.   MRN: [id number]  HPI Pt is a 79 yo male with T2DM, cardiomyopathy, chronic heart failure, afib who presents to the clinic for 3 month follow up.   He had an episode of dizziness, syncope, and collapse on feb 3rd. Looking back at records shows his ICD was alerted after detecting VF episode. He has not followed up with cardiology. He has not had any other events. He did hit is nose and bit his lip. He broke his dentures.   He is not checking his sugars. He denies any hypoglycemic events. He admits he is not taking trulicity like he should. He has tried to limit sugars/carbs. He is taking synjardy. No open sores or wounds.   .. Active Ambulatory Problems    Diagnosis Date Noted  . Essential hypertension, benign 04/28/2013  . BPH (benign prostatic hyperplasia) 04/28/2013  . Gout 04/28/2013  . Congestive dilated cardiomyopathy ([place]) 05/10/2013  . ICD (implantable cardioverter-defibrillator) in place 05/10/2013  . Elevated PSA 11/24/2013  . Systolic dysfunction with acute on chronic heart failure ([place]) 09/03/2014  . Morbid obesity ([place]) 09/03/2014  . Erectile dysfunction 03/10/2015  . Lower extremity edema 09/29/2017  . Abnormal ultrasound of lower extremity 09/29/2017  . Malignant neoplasm of prostate ([place]) 02/03/2018  . Family history of cancer 02/03/2018  . Family history of prostate cancer   . Family history of breast cancer   . Genetic testing 03/25/2018  . Hypotension 05/23/2018  . Paroxysmal atrial fibrillation ([place]) 12/25/2015  . History of gout 08/01/2018  . Type 2 diabetes mellitus with chronic kidney disease, 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[place]) 08/01/2018  . Acute left-sided low back pain without sciatica 05/16/2019  . Syncope and collapse 05/24/2019  . Chronic systolic heart failure ([place]) 06/08/2019  . 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 Fall 08/16/2019  . Impetigo  08/18/2019   Resolved Ambulatory Problems    Diagnosis Date Noted  . Diabetes mellitus due to underlying condition, controlled, without complication, 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[place]) 04/28/2013   Past Medical History:  Diagnosis Date  . CHF (congestive heart failure) ([place])   . Diabetes mellitus ([place])   . Hyperlipidemia   . Hypertension   . Nonischemic cardiomyopathy ([place])   . Prostate cancer [facility])     Review of Systems   see HPI.  Objective:   Physical Exam Vitals reviewed.  Constitutional:      Appearance: Normal appearance.  HENT:     Head: Normocephalic.     Right Ear: Tympanic membrane normal.     Left Ear: Tympanic membrane normal.     Nose:     Comments: Scabbed over abrasion on exterior nose.  Slight deviated septum to the right.     Mouth/Throat:     Comments: Left lower lip noticeable laceration that is starting to scab over. enitre lower lip swollen with honey crusting on top. Painful to touch.  Eyes:     General:        Right eye: No discharge.     Conjunctiva/sclera: Conjunctivae normal.  Cardiovascular:     Rate and Rhythm: Normal rate.     Heart sounds: Murmur present.  Pulmonary:     Effort: Pulmonary effort is normal.     Breath sounds: Normal breath sounds.  Neurological:     General: No focal deficit present.     Mental Status: He is  alert and oriented to person, place, and time.  Psychiatric:        Mood and Affect: Mood normal.           Assessment & Plan:  [name] KitchenMarland KitchenHabeeb was seen today for diabetes.  Diagnoses and all orders for this visit:  Type 2 diabetes mellitus with chronic kidney disease, without long-term current use of insulin, unspecified CKD stage (HCC) -     POCT glycosylated hemoglobin (Hb A1C) -     Dulaglutide (TRULICITY) 1.5 0000000 SOPN; Inject 1.5 mg into the skin once a week. -     Empagliflozin-metFORMIN HCl ER (SYNJARDY XR) 03-999 MG TB24; Take 1 tablet by mouth daily. -     POCT UA -  Microalbumin  Impetigo -     clindamycin (CLEOCIN) 300 MG capsule; Take 1 capsule (300 mg total) by mouth 2 (two) times daily with a meal.  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  Abrasion of nose, initial encounter   .[name] Results for orders placed or performed in visit on 08/16/19  POCT glycosylated hemoglobin (Hb A1C)  Result Value Ref Range   Hemoglobin A1C 7.3 (A) 4.0 - 5.6 %   HbA1c POC (<> result, manual entry)     HbA1c, POC (prediabetic range)     HbA1c, POC (controlled diabetic range)    POCT UA - Microalbumin  Result Value Ref Range   Microalbumin Ur, POC 30 mg/L   Creatinine, POC 100 mg/dL   Albumin/Creatinine Ratio, Urine, POC <30    A1C is not to goal.  Needs to start taking trulicity. Explained importance.  Continue synjardy.  On ACE/ARB. BP to goal.  microalbumin negative.  UTD eye exam.  Pneumonia and flu UTD.  .. Diabetic Foot Exam - Simple   Simple Foot Form Diabetic Foot exam was performed with the following findings: Yes 08/16/2019  2:26 PM  Visual Inspection See comments: Yes Sensation Testing Intact to touch and monofilament testing bilaterally: Yes See comments: Yes Pulse Check Posterior Tibialis and Dorsalis pulse intact bilaterally: Yes Comments No feeling to monofilament testing on bilateral heels. Nails are thick.     Follow up in 3 months.   Strongly encouraged close follow up with cardiology.   He certainly could have used stitches for lip after fall. Lip appears infected with honey crusted lesions. Sent clindamycin. Use cool compresses for swelling. Nose healing well.

## 2019-08-16 NOTE — Patient Instructions (Signed)
t

## 2019-08-17 NOTE — Telephone Encounter (Signed)
VT noted with V>A Also has longstanding atrial flutter.  Continue current management for now.

## 2019-08-18 ENCOUNTER — Encounter: Payer: Self-pay | Admitting: Physician Assistant

## 2019-08-18 DIAGNOSIS — L0100 Impetigo, unspecified: Secondary | ICD-10-CM | POA: Insufficient documentation

## 2019-08-18 NOTE — Progress Notes (Signed)
EPIC Encounter for ICM Monitoring  Patient Name: Earl Czarny Sr. is a 79 y.o. male Date: 08/18/2019 Primary Care Physican: Lavada Mesi Primary Cardiologist:Crenshaw Electrophysiologist: Allred Bi-V Pacing: 90% 2/17/2021Office Weight:241 lbs   Transmission reviewed.   CorVue thoracic impedance normal.  Prescribed:Furosemide 40 mg1tablet (40 mg total) daily.  Labs: 05/23/2019 Creatinine 1.22, BUN 21, Potassium 4.3, Sodium 145, GFR 56-65 12/02/2018 Creatinine 1.26, BUN 24, Potassium 4.7, Sodium 142, GFR 54-63 07/27/2018 Creatinine 1.30, BUN 28, Potassium 4.5, Sodium 143 07/13/2018 Creatinine 1.37, BUN 24, Potassium 4.8, Sodium 142  Recommendations: None  Follow-up plan: ICM clinic phone appointment on 09/22/2019.   91 day device clinic remote transmission 09/21/2019.    Copy of ICM check sent to Dr. Rayann Heman.   3 month ICM trend: 08/14/2019    1 Year ICM trend:       Rosalene Billings, RN 08/18/2019 4:45 PM

## 2019-09-05 ENCOUNTER — Other Ambulatory Visit: Payer: Self-pay | Admitting: Physician Assistant

## 2019-09-05 DIAGNOSIS — I5023 Acute on chronic systolic (congestive) heart failure: Secondary | ICD-10-CM

## 2019-09-05 DIAGNOSIS — I420 Dilated cardiomyopathy: Secondary | ICD-10-CM

## 2019-09-05 NOTE — Telephone Encounter (Signed)
Im pretty sure cardiology has been refilling it. Could we call pharmacy and confirm he has been getting monthy? If so ok to refill.

## 2019-09-05 NOTE — Telephone Encounter (Signed)
Medication last written 06/2018, has an end date of 10/2018? Should he still be taking?

## 2019-09-08 DIAGNOSIS — Z23 Encounter for immunization: Secondary | ICD-10-CM | POA: Diagnosis not present

## 2019-09-21 ENCOUNTER — Ambulatory Visit (INDEPENDENT_AMBULATORY_CARE_PROVIDER_SITE_OTHER): Payer: Medicare Other | Admitting: *Deleted

## 2019-09-21 DIAGNOSIS — I5022 Chronic systolic (congestive) heart failure: Secondary | ICD-10-CM

## 2019-09-21 LAB — CUP PACEART REMOTE DEVICE CHECK
Battery Remaining Longevity: 14 mo
Battery Remaining Percentage: 16 %
Battery Voltage: 2.74 V
Brady Statistic AP VP Percent: 91 %
Brady Statistic AP VS Percent: 6 %
Brady Statistic AS VP Percent: 1.3 %
Brady Statistic AS VS Percent: 1 %
Brady Statistic RA Percent Paced: 93 %
Date Time Interrogation Session: 20210325020022
HighPow Impedance: 42 Ohm
HighPow Impedance: 43 Ohm
Implantable Lead Implant Date: 20140117
Implantable Lead Implant Date: 20140117
Implantable Lead Implant Date: 20140117
Implantable Lead Location: 753858
Implantable Lead Location: 753859
Implantable Lead Location: 753860
Implantable Pulse Generator Implant Date: 20140117
Lead Channel Impedance Value: 410 Ohm
Lead Channel Impedance Value: 440 Ohm
Lead Channel Impedance Value: 930 Ohm
Lead Channel Pacing Threshold Amplitude: 0.75 V
Lead Channel Pacing Threshold Amplitude: 1 V
Lead Channel Pacing Threshold Amplitude: 1 V
Lead Channel Pacing Threshold Pulse Width: 0.5 ms
Lead Channel Pacing Threshold Pulse Width: 0.5 ms
Lead Channel Pacing Threshold Pulse Width: 0.5 ms
Lead Channel Sensing Intrinsic Amplitude: 10.3 mV
Lead Channel Sensing Intrinsic Amplitude: 5 mV
Lead Channel Setting Pacing Amplitude: 1.75 V
Lead Channel Setting Pacing Amplitude: 2 V
Lead Channel Setting Pacing Amplitude: 2 V
Lead Channel Setting Pacing Pulse Width: 0.5 ms
Lead Channel Setting Pacing Pulse Width: 0.5 ms
Lead Channel Setting Sensing Sensitivity: 0.5 mV
Pulse Gen Serial Number: 7070427

## 2019-09-22 ENCOUNTER — Ambulatory Visit (INDEPENDENT_AMBULATORY_CARE_PROVIDER_SITE_OTHER): Payer: Medicare Other

## 2019-09-22 DIAGNOSIS — Z95810 Presence of automatic (implantable) cardiac defibrillator: Secondary | ICD-10-CM | POA: Diagnosis not present

## 2019-09-22 DIAGNOSIS — I5022 Chronic systolic (congestive) heart failure: Secondary | ICD-10-CM

## 2019-09-22 NOTE — Progress Notes (Signed)
ICD Remote  

## 2019-09-25 ENCOUNTER — Telehealth: Payer: Self-pay

## 2019-09-25 NOTE — Progress Notes (Signed)
EPIC Encounter for ICM Monitoring  Patient Name: Earl Thwaites Sr. is a 80 y.o. male Date: 09/25/2019 Primary Care Physican: Lavada Mesi Primary Cardiologist:Crenshaw Electrophysiologist: Allred Bi-V Pacing: 90% 2/17/2021Office Weight:241 lbs  AT/AF Burden 2.2%   Attempted call to patient and unable to reach.  Left detailed message per DPR regarding transmission. Transmission reviewed.   CorVue thoracic impedancenormal but suggesting possible fluid accumulation 2/16 - 3/1 and 3/13 - 3/23.  Prescribed:Furosemide 40 mg1tablet (40 mg total) daily.  Labs: 05/23/2019 Creatinine 1.22, BUN 21, Potassium 4.3, Sodium 145, GFR 56-65 12/02/2018 Creatinine 1.26, BUN 24, Potassium 4.7, Sodium 142, GFR 54-63 07/27/2018 Creatinine 1.30, BUN 28, Potassium 4.5, Sodium 143 07/13/2018 Creatinine 1.37, BUN 24, Potassium 4.8, Sodium 142  Recommendations:Left voice mail with ICM number and encouraged to call if experiencing any fluid symptoms.  Follow-up plan: ICM clinic phone appointment on4/26/2021. 91 day device clinic remote transmission 12/21/2019.   Copy of ICM check sent to Dr.Allred.  3 month ICM trend: 09/21/2019    1 Year ICM trend:       Rosalene Billings, RN 09/25/2019 2:23 PM

## 2019-09-25 NOTE — Telephone Encounter (Signed)
Remote ICM transmission received.  Attempted call to patient regarding ICM remote transmission and left detailed message per DPR.  Advised to return call for any fluid symptoms or questions. Next ICM remote transmission scheduled 10/23/2019.

## 2019-09-30 ENCOUNTER — Other Ambulatory Visit: Payer: Self-pay | Admitting: Cardiology

## 2019-09-30 DIAGNOSIS — I420 Dilated cardiomyopathy: Secondary | ICD-10-CM

## 2019-10-23 ENCOUNTER — Ambulatory Visit (INDEPENDENT_AMBULATORY_CARE_PROVIDER_SITE_OTHER): Payer: Medicare Other

## 2019-10-23 ENCOUNTER — Telehealth: Payer: Self-pay

## 2019-10-23 DIAGNOSIS — Z95810 Presence of automatic (implantable) cardiac defibrillator: Secondary | ICD-10-CM | POA: Diagnosis not present

## 2019-10-23 DIAGNOSIS — I5022 Chronic systolic (congestive) heart failure: Secondary | ICD-10-CM

## 2019-10-23 NOTE — Progress Notes (Signed)
EPIC Encounter for ICM Monitoring  Patient Name: Earl Oscarson Sr. is a 78 y.o. male Date: 10/23/2019 Primary Care Physican: Lavada Mesi Primary Cardiologist:Crenshaw Electrophysiologist: Allred Bi-V Pacing: 90% 2/17/2021OfficeWeight:241lbs  AT/AF Burden 1/7% (takes Eliquis)   Attempted call to patient and unable to reach.  Left detailed message per DPR regarding transmission. Transmission reviewed.   CorVue thoracic impedancesuggesting possible fluid accumulation 10/17/2019.  Prescribed:Furosemide 40 mg1tablet (40 mg total) daily.  Labs: 05/23/2019 Creatinine 1.22, BUN 21, Potassium 4.3, Sodium 145, GFR 56-65 12/02/2018 Creatinine 1.26, BUN 24, Potassium 4.7, Sodium 142, GFR 54-63 07/27/2018 Creatinine 1.30, BUN 28, Potassium 4.5, Sodium 143 07/13/2018 Creatinine 1.37, BUN 24, Potassium 4.8, Sodium 142  Recommendations:Left voice mail with ICM number and encouraged to call if experiencing any fluid symptoms.  Follow-up plan: ICM clinic phone appointment on5/09/2019 to recheck fluid levels. 91 day device clinic remote transmission 12/21/2019.   Copy of ICM check sent to Dr.Allred and Dr [name].  3 month ICM trend: 10/23/2019    1 Year ICM trend:       Earl Billings, RN 10/23/2019 10:35 AM

## 2019-10-23 NOTE — Telephone Encounter (Signed)
Remote ICM transmission received.  Attempted call to patient regarding ICM remote transmission and left detailed message per DPR.  Advised to return call for any fluid symptoms or questions. Next ICM remote transmission scheduled 10/31/2019.

## 2019-10-31 ENCOUNTER — Ambulatory Visit (INDEPENDENT_AMBULATORY_CARE_PROVIDER_SITE_OTHER): Payer: Medicare Other

## 2019-10-31 DIAGNOSIS — Z95810 Presence of automatic (implantable) cardiac defibrillator: Secondary | ICD-10-CM

## 2019-10-31 DIAGNOSIS — I5022 Chronic systolic (congestive) heart failure: Secondary | ICD-10-CM

## 2019-11-01 NOTE — Progress Notes (Signed)
EPIC Encounter for ICM Monitoring  Patient Name: Earl Barrus Sr. is a 79 y.o. male Date: 11/01/2019 Primary Care Physican: Lavada Mesi Primary Cardiologist:Crenshaw Electrophysiologist: Allred Bi-V Pacing: 90% 2/17/2021OfficeWeight:241lbs  AT/AF Burden 1.6%     Transmission reviewed.   CorVue thoracic impedancereturned to normal since 10/23/2019 remote transmission.  Prescribed:  Furosemide 40 mg1tablet (40 mg total) daily.  Spironolactone 25 mg take 0.5 tablet (12.5 mg total) daily  Eliquis 5 mg take 1 tablet twice a day.  Labs: 05/23/2019 Creatinine 1.22, BUN 21, Potassium 4.3, Sodium 145, GFR 56-65 12/02/2018 Creatinine 1.26, BUN 24, Potassium 4.7, Sodium 142, GFR 54-63 07/27/2018 Creatinine 1.30, BUN 28, Potassium 4.5, Sodium 143 07/13/2018 Creatinine 1.37, BUN 24, Potassium 4.8, Sodium 142  Recommendations: None  Follow-up plan: ICM clinic phone appointment on6/12/2019. 91 day device clinic remote transmission6/24/2021.   Copy of ICM check sent to Dr.Allred.  3 month ICM trend: 10/31/2019    1 Year ICM trend:       Earl Billings, RN 11/01/2019 8:47 AM

## 2019-11-07 ENCOUNTER — Other Ambulatory Visit: Payer: Self-pay | Admitting: Physician Assistant

## 2019-11-07 DIAGNOSIS — N521 Erectile dysfunction due to diseases classified elsewhere: Secondary | ICD-10-CM

## 2019-11-08 NOTE — Telephone Encounter (Signed)
Last filled 10/10/18 #10 with no refills

## 2019-11-13 ENCOUNTER — Ambulatory Visit: Payer: Medicare Other | Admitting: Physician Assistant

## 2019-11-28 ENCOUNTER — Telehealth: Payer: Self-pay

## 2019-11-28 NOTE — Telephone Encounter (Signed)
Merlin alert received 11/25/2019 for VT zone met 200 bpm requiring ATP x3. Longest episode was 14 seconds. Per med list, + Coreg 25 mg BID, Entresto 97-103 mg BID.  Presenting rhythm APBiv pacing 60's.   Called patient to assess, check medication compliance, review driving restrictions and shock plan. No answer. LMOVM per DPR.   Will forward to MD for review and recommendations.

## 2019-11-30 NOTE — Telephone Encounter (Signed)
Called patients alternate number. Patient reports during time of episode he was climbing a flight is stairs and became short of breath when the episode started. Patient states he took some deep breaths and started to feel better. Patient reports of medication compliance. Patient education complete regarding shock plan. Also advised patient per  DMV patient should not drive for the next 6 months starting date 11/25/19. Patient verbalizes understanding. Patient given ED precautions if he is symptomatic and feels he needs to go. Patient advised to call DC back if he has any further questions or concerns. Verbalizes understating. Direct number provided.

## 2019-11-30 NOTE — Telephone Encounter (Signed)
Attempted to call patient. No answer, LMOVM. 

## 2019-12-04 ENCOUNTER — Ambulatory Visit (INDEPENDENT_AMBULATORY_CARE_PROVIDER_SITE_OTHER): Payer: BC Managed Care – PPO

## 2019-12-04 DIAGNOSIS — I5022 Chronic systolic (congestive) heart failure: Secondary | ICD-10-CM

## 2019-12-04 DIAGNOSIS — Z95810 Presence of automatic (implantable) cardiac defibrillator: Secondary | ICD-10-CM | POA: Diagnosis not present

## 2019-12-05 ENCOUNTER — Telehealth: Payer: Self-pay

## 2019-12-05 NOTE — Telephone Encounter (Signed)
Left message for patient to remind of missed remote transmission.  

## 2019-12-06 ENCOUNTER — Telehealth: Payer: Self-pay

## 2019-12-06 NOTE — Telephone Encounter (Signed)
Remote ICM transmission received.  Attempted call to patient regarding ICM remote transmission and no answer or answering machine. 

## 2019-12-06 NOTE — Progress Notes (Signed)
EPIC Encounter for ICM Monitoring  Patient Name: Earl Bostwick Sr. is a 79 y.o. male Date: 12/06/2019 Primary Care Physican: Lavada Mesi Primary Cardiologist:Crenshaw Electrophysiologist: Allred Bi-V Pacing: 91% 2/17/2021OfficeWeight:241lbs  AT/AF Burden1.4%    Attempted call to patient and unable to reach.   Transmission reviewed.   CorVue thoracic impedance normal.  Prescribed:  Furosemide 40 mg1tablet (40 mg total) daily.  Spironolactone 25 mg take 0.5 tablet (12.5 mg total) daily  Eliquis 5 mg take 1 tablet twice a day.  Labs: 05/23/2019 Creatinine 1.22, BUN 21, Potassium 4.3, Sodium 145, GFR 56-65 12/02/2018 Creatinine 1.26, BUN 24, Potassium 4.7, Sodium 142, GFR 54-63 07/27/2018 Creatinine 1.30, BUN 28, Potassium 4.5, Sodium 143 07/13/2018 Creatinine 1.37, BUN 24, Potassium 4.8, Sodium 142  Recommendations:Unable to reach.    Follow-up plan: ICM clinic phone appointment on7/05/2020.91 day device clinic remote transmission6/24/2021. Last office visit with Dr [name] was 07/13/2018  Copy of ICM check sent to Dr.Allred.  3 month ICM trend: 12/06/2019    1 Year ICM trend:       Rosalene Billings, RN 12/06/2019 10:06 AM

## 2019-12-10 NOTE — Telephone Encounter (Signed)
Continue current plan.

## 2019-12-11 ENCOUNTER — Other Ambulatory Visit: Payer: Self-pay

## 2019-12-11 ENCOUNTER — Encounter: Payer: Self-pay | Admitting: Physician Assistant

## 2019-12-11 ENCOUNTER — Ambulatory Visit (INDEPENDENT_AMBULATORY_CARE_PROVIDER_SITE_OTHER): Payer: Medicare Other | Admitting: Physician Assistant

## 2019-12-11 VITALS — BP 110/70 | HR 61 | Ht 70.0 in | Wt 226.0 lb

## 2019-12-11 DIAGNOSIS — C61 Malignant neoplasm of prostate: Secondary | ICD-10-CM

## 2019-12-11 DIAGNOSIS — I5022 Chronic systolic (congestive) heart failure: Secondary | ICD-10-CM

## 2019-12-11 DIAGNOSIS — Z1159 Encounter for screening for other viral diseases: Secondary | ICD-10-CM | POA: Diagnosis not present

## 2019-12-11 DIAGNOSIS — I420 Dilated cardiomyopathy: Secondary | ICD-10-CM

## 2019-12-11 DIAGNOSIS — E1122 Type 2 diabetes mellitus with diabetic chronic kidney disease: Secondary | ICD-10-CM

## 2019-12-11 DIAGNOSIS — Z23 Encounter for immunization: Secondary | ICD-10-CM

## 2019-12-11 DIAGNOSIS — E785 Hyperlipidemia, unspecified: Secondary | ICD-10-CM

## 2019-12-11 DIAGNOSIS — Z0000 Encounter for general adult medical examination without abnormal findings: Secondary | ICD-10-CM | POA: Diagnosis not present

## 2019-12-11 DIAGNOSIS — I480 Paroxysmal atrial fibrillation: Secondary | ICD-10-CM

## 2019-12-11 DIAGNOSIS — E782 Mixed hyperlipidemia: Secondary | ICD-10-CM | POA: Insufficient documentation

## 2019-12-11 DIAGNOSIS — K625 Hemorrhage of anus and rectum: Secondary | ICD-10-CM | POA: Diagnosis not present

## 2019-12-11 LAB — POCT GLYCOSYLATED HEMOGLOBIN (HGB A1C): Hemoglobin A1C: 6.2 % — AB (ref 4.0–5.6)

## 2019-12-11 MED ORDER — AMBULATORY NON FORMULARY MEDICATION: 0 refills | Status: DC

## 2019-12-11 NOTE — Patient Instructions (Signed)
Get eye exam. Referral made.  Get labs when fasting(nothing to eat for 8 hours) Referral placed for gastroenterology for bleeding.  Need to follow up with urology.

## 2019-12-11 NOTE — Progress Notes (Signed)
Subjective:    Patient ID: Earl Hatcher Sr., male    DOB: 1941-06-11, 80 y.o.   MRN: [id number]  HPI  Pt is a 79 yo male with CHF, PAF, HTN, T2DM, Prostate Cancer, HLD who presents to the clinic for annual physical.   He sees cardiology for CHF, ICD, PAF, HTN. He has not seen them since Jan 2021. In last 3 months he has not had ICD activated. He continues on lasix, spiroactolone, entrestro, carvediol and eliqus. No syncope, CP, SOB, palpitations recently.   He has started to have intermittent blood in stool about once a week for the last 2 months. Describes as bright red. Denies any constipation or hemorrhoids. Denies any associated abdominal pain. Not sure when last colonoscopy was. No fever, chills, weight loss, night sweats.   He also has not followed up with urology for prostate cancer since pandemic. Per pt no increased problems with urinary flow or symptoms.   .. Active Ambulatory Problems    Diagnosis Date Noted  . Essential hypertension, benign 04/28/2013  . BPH (benign prostatic hyperplasia) 04/28/2013  . Gout 04/28/2013  . Congestive dilated cardiomyopathy ([place]) 05/10/2013  . ICD (implantable cardioverter-defibrillator) in place 05/10/2013  . Elevated PSA 11/24/2013  . Systolic dysfunction with acute on chronic heart failure ([place]) 09/03/2014  . Morbid obesity ([place]) 09/03/2014  . Erectile dysfunction 03/10/2015  . Lower extremity edema 09/29/2017  . Abnormal ultrasound of lower extremity 09/29/2017  . Malignant neoplasm of prostate ([place]) 02/03/2018  . Family history of cancer 02/03/2018  . Family history of prostate cancer   . Family history of breast cancer   . Genetic testing 03/25/2018  . Hypotension 05/23/2018  . Paroxysmal atrial fibrillation ([place]) 12/25/2015  . History of gout 08/01/2018  . Type 2 diabetes mellitus with chronic kidney disease, 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[place]) 08/01/2018  . Acute left-sided low back pain without sciatica 05/16/2019   . Syncope and collapse 05/24/2019  . Chronic systolic heart failure ([place]) 06/08/2019  . 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 Fall 08/16/2019  . Impetigo 08/18/2019  . Rectal bleeding 12/11/2019  . Hyperlipidemia LDL goal <70 12/11/2019   Resolved Ambulatory Problems    Diagnosis Date Noted  . Diabetes mellitus due to underlying condition, controlled, without complication, 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[place]) 04/28/2013   Past Medical History:  Diagnosis Date  . CHF (congestive heart failure) ([place])   . Diabetes mellitus ([place])   . Hyperlipidemia   . Hypertension   . Nonischemic cardiomyopathy ([place])   . Prostate cancer ([place])    .[name] Social History   Socioeconomic History  . Marital status: Widowed    Spouse name: Not on file  . Number of children: 1  . Years of education: masters  . Highest education level: Master's degree (e.g., MA, MS, MEng, MEd, MSW, MBA)  Occupational History  . Occupation: retired    Comment: [employer]  Tobacco Use  . Smoking status: Never Smoker  . Smokeless tobacco: Never Used  Vaping Use  . Vaping Use: Never used  Substance and Sexual Activity  . Alcohol use: Yes    Comment: Occasional  . Drug use: No  . Sexual activity: Yes  Other Topics Concern  . Not on file  Social History Narrative   Previously lived in [place] before moving to [place].  He has recently relocated to [facility]. 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   Social Determinants of Health   Financial Resource Strain:   .  Difficulty of Paying Living Expenses:   Food Insecurity:   . Worried About Charity fundraiser in the Last Year:   . Arboriculturist in the Last Year:   Transportation Needs:   . Film/video editor (Medical):   [name] Lack of Transportation (Non-Medical):   Physical Activity: Sufficiently Active  . Days of Exercise per Week: 4 days  . Minutes of Exercise per Session: 40 min  Stress:   . Feeling of Stress :   Social Connections:  Unknown  . Frequency of Communication with Friends and Family: Not on file  . Frequency of Social Gatherings with Friends and Family: Never  . Attends Religious Services: Not on file  . Active Member of Clubs or Organizations: Not on file  . Attends Archivist Meetings: Not on file  . Marital Status: Not on file  Intimate Partner Violence:   . Fear of Current or Ex-Partner:   . Emotionally Abused:   [name] Physically Abused:   . Sexually Abused:    .[name] Family History  Problem Relation Age of Onset  . Diabetes Mother   . Hyperlipidemia Mother   . Heart attack Brother   . Breast cancer Sister 29  . Prostate cancer Maternal Uncle   . Prostate cancer Cousin        [name] vet, agent orange exposure  . Prostate cancer Cousin        dx 28-30 yrs. - mat first cousin's son  . Prostate cancer Cousin   . Prostate cancer Cousin   . Diabetes Brother   . Throat cancer Brother 63       [place] vet, ? due to agent orange exposure       Review of Systems  All other systems reviewed and are negative.      Objective:   Physical Exam BP 110/70   Pulse 61   Ht 5\' 10"  (1.778 m)   Wt 226 lb (102.5 kg)   SpO2 93%   BMI 32.43 kg/m   General Appearance:    Alert, cooperative, no distress, appears stated age  Head:    Normocephalic, without obvious abnormality, atraumatic  Eyes:    PERRL, conjunctiva/corneas clear, EOM's intact, fundi    benign, both eyes       Ears:    Normal TM's and external ear canals, both ears  Nose:   Nares normal, septum midline, mucosa normal, no drainage    or sinus tenderness  Throat:   Lips, mucosa, and tongue normal; teeth and gums normal  Neck:   Supple, symmetrical, trachea midline, no adenopathy;       thyroid:  No enlargement/tenderness/nodules; no carotid   bruit or JVD  Back:     Symmetric, no curvature, ROM normal, no CVA tenderness  Lungs:     Clear to auscultation bilaterally, respirations unlabored  Chest wall:    No tenderness or  deformity  Heart:    Regular rate and rhythm, S1 and S2 normal, systolic murmur 7/0.WC rub   or gallop  Abdomen:     Soft, non-tender, bowel sounds active all four quadrants,    no masses, no organomegaly        Extremities:   Extremities normal, atraumatic, no cyanosis or edema  Pulses:   2+ and symmetric all extremities  Skin:   Skin color, texture, turgor normal, no rashes or lesions  Lymph nodes:   Cervical, supraclavicular, and axillary nodes normal  Neurologic:   CNII-XII  intact. Normal strength, sensation and reflexes      throughout     .[name] Depression screen [facility] 2/9 12/11/2019 05/16/2019 05/17/2018 01/31/2018 03/05/2017  Decreased Interest 0 1 0 0 0  Down, Depressed, Hopeless 0 0 0 0 0  PHQ - 2 Score 0 1 0 0 0  Altered sleeping 0 1 - - -  Tired, decreased energy 1 0 - - -  Change in appetite 0 1 - - -  Feeling bad or failure about yourself  0 0 - - -  Trouble concentrating 0 0 - - -  Moving slowly or fidgety/restless 1 0 - - -  Suicidal thoughts 0 0 - - -  PHQ-9 Score 2 3 - - -  Difficult doing work/chores Not difficult at all Not difficult at all - - -  Some recent data might be hidden   .[name] GAD 7 : Generalized Anxiety Score 12/11/2019 05/16/2019  Nervous, Anxious, on Edge 0 0  Control/stop worrying 0 0  Worry too much - different things 0 0  Trouble relaxing 0 1  Restless 0 0  Easily annoyed or irritable 0 1  Afraid - awful might happen 0 0  Total GAD 7 Score 0 2  Anxiety Difficulty Not difficult at all Not difficult at all          Assessment & Plan:  [name] KitchenMarland KitchenEdmon was seen today for annual exam.  Diagnoses and all orders for this visit:  Routine physical examination  Type 2 diabetes mellitus with chronic kidney disease, without long-term current use of insulin, unspecified CKD stage (HCC) -     POCT glycosylated hemoglobin (Hb A1C) -     Ambulatory referral to Ophthalmology -     COMPLETE METABOLIC PANEL WITH GFR -     Dulaglutide (TRULICITY) 1.5 LE/7.5TZ  SOPN; Inject 0.5 mLs (1.5 mg total) into the skin once a week. -     Empagliflozin-metFORMIN HCl ER (SYNJARDY XR) 03-999 MG TB24; Take 1 tablet by mouth daily.  Chronic systolic heart failure (HCC) -     COMPLETE METABOLIC PANEL WITH GFR  Congestive dilated cardiomyopathy (HCC) -     COMPLETE METABOLIC PANEL WITH GFR  Paroxysmal atrial fibrillation (HCC) -     COMPLETE METABOLIC PANEL WITH GFR  Malignant neoplasm of prostate (HCC) -     PSA -     COMPLETE METABOLIC PANEL WITH GFR -     Ambulatory referral to Urology  Hyperlipidemia LDL goal <70 -     Lipid Panel w/reflex Direct LDL  Encounter for hepatitis C screening test for low risk patient -     Hepatitis C Antibody  Rectal bleeding -     CBC -     Ambulatory referral to Gastroenterology  Need for shingles vaccine -     AMBULATORY NON FORMULARY MEDICATION; shingrx 2 doses to prevent shingles.   Start a regular exercise program and make sure you are eating a healthy diet Try to eat 4 servings of dairy a day or take a calcium supplement (500mg  twice a day). Need shingles vaccine. rx printed.  Fasting labs ordered.   .. Results for orders placed or performed in visit on 12/11/19  POCT glycosylated hemoglobin (Hb A1C)  Result Value Ref Range   Hemoglobin A1C 6.2 (A) 4.0 - 5.6 %   HbA1c POC (<> result, manual entry)     HbA1c, POC (prediabetic range)     HbA1c, POC (controlled diabetic range)     A1C  is stable.  Continue same medications.  On ACE. BP to goal.  On statin.  Foot exam UTD.  Pneumonia vaccine UTD.  Needs eye exam.  CHF-managed by cardiology. Controlled on medications. No increased swelling or SOB today.   Rectal bleeding-referral made to GI. He is on eliquis. No constipation. No hemorrhoids.    Prostate cancer no follow up since 2019. Will make referral and recheck PSA.   Follow up in 3 months.

## 2019-12-13 DIAGNOSIS — Z1159 Encounter for screening for other viral diseases: Secondary | ICD-10-CM | POA: Diagnosis not present

## 2019-12-13 DIAGNOSIS — E1122 Type 2 diabetes mellitus with diabetic chronic kidney disease: Secondary | ICD-10-CM | POA: Diagnosis not present

## 2019-12-13 DIAGNOSIS — E785 Hyperlipidemia, unspecified: Secondary | ICD-10-CM | POA: Diagnosis not present

## 2019-12-13 DIAGNOSIS — I420 Dilated cardiomyopathy: Secondary | ICD-10-CM | POA: Diagnosis not present

## 2019-12-13 DIAGNOSIS — I5022 Chronic systolic (congestive) heart failure: Secondary | ICD-10-CM | POA: Diagnosis not present

## 2019-12-13 DIAGNOSIS — I480 Paroxysmal atrial fibrillation: Secondary | ICD-10-CM | POA: Diagnosis not present

## 2019-12-13 DIAGNOSIS — K625 Hemorrhage of anus and rectum: Secondary | ICD-10-CM | POA: Diagnosis not present

## 2019-12-13 DIAGNOSIS — C61 Malignant neoplasm of prostate: Secondary | ICD-10-CM | POA: Diagnosis not present

## 2019-12-13 MED ORDER — SYNJARDY XR 10-1000 MG PO TB24: 1.0000 | ORAL_TABLET | Freq: Every day | ORAL | 0 refills | Status: DC

## 2019-12-13 MED ORDER — TRULICITY 1.5 MG/0.5ML ~~LOC~~ SOAJ: 1.5000 mg | SUBCUTANEOUS | 2 refills | Status: DC

## 2019-12-14 LAB — COMPLETE METABOLIC PANEL WITH GFR
AG Ratio: 1.8 (calc) (ref 1.0–2.5)
ALT: 17 U/L (ref 9–46)
AST: 18 U/L (ref 10–35)
Albumin: 4.6 g/dL (ref 3.6–5.1)
Alkaline phosphatase (APISO): 66 U/L (ref 35–144)
BUN/Creatinine Ratio: 23 (calc) — ABNORMAL HIGH (ref 6–22)
BUN: 28 mg/dL — ABNORMAL HIGH (ref 7–25)
CO2: 27 mmol/L (ref 20–32)
Calcium: 9.6 mg/dL (ref 8.6–10.3)
Chloride: 107 mmol/L (ref 98–110)
Creat: 1.22 mg/dL — ABNORMAL HIGH (ref 0.70–1.18)
GFR, Est African American: 65 mL/min/{1.73_m2} (ref >=60)
GFR, Est Non African American: 56 mL/min/{1.73_m2} — ABNORMAL LOW (ref >=60)
Globulin: 2.5 g/dL (calc) (ref 1.9–3.7)
Glucose, Bld: 94 mg/dL (ref 65–99)
Potassium: 4.5 mmol/L (ref 3.5–5.3)
Sodium: 141 mmol/L (ref 135–146)
Total Bilirubin: 0.7 mg/dL (ref 0.2–1.2)
Total Protein: 7.1 g/dL (ref 6.1–8.1)

## 2019-12-14 LAB — HEPATITIS C ANTIBODY
Hepatitis C Ab: NONREACTIVE
SIGNAL TO CUT-OFF: 0.18 (ref <1.00)

## 2019-12-14 LAB — CBC
HCT: 41.4 % (ref 38.5–50.0)
Hemoglobin: 13.6 g/dL (ref 13.2–17.1)
MCH: 29.8 pg (ref 27.0–33.0)
MCHC: 32.9 g/dL (ref 32.0–36.0)
MCV: 90.6 fL (ref 80.0–100.0)
MPV: 11.8 fL (ref 7.5–12.5)
Platelets: 194 10*3/uL (ref 140–400)
RBC: 4.57 10*6/uL (ref 4.20–5.80)
RDW: 14.6 % (ref 11.0–15.0)
WBC: 5.1 10*3/uL (ref 3.8–10.8)

## 2019-12-14 LAB — LIPID PANEL W/REFLEX DIRECT LDL
Cholesterol: 136 mg/dL (ref <200)
HDL: 38 mg/dL — ABNORMAL LOW (ref >=40)
LDL Cholesterol (Calc): 80 mg/dL (calc)
Non-HDL Cholesterol (Calc): 98 mg/dL (calc) (ref <130)
Total CHOL/HDL Ratio: 3.6 (calc) (ref <5.0)
Triglycerides: 96 mg/dL (ref <150)

## 2019-12-14 LAB — PSA: PSA: 12 ng/mL — ABNORMAL HIGH (ref <=4.0)

## 2019-12-15 NOTE — Progress Notes (Signed)
Call pt:   Hep C negative. PSA has gone up since 2019. I made referral to get back in with urology. Have you heard from them? You need to get back in.  Renal function stable.  Cholesterol stable.

## 2019-12-21 ENCOUNTER — Ambulatory Visit (INDEPENDENT_AMBULATORY_CARE_PROVIDER_SITE_OTHER): Payer: Medicare Other | Admitting: *Deleted

## 2019-12-21 DIAGNOSIS — I255 Ischemic cardiomyopathy: Secondary | ICD-10-CM | POA: Diagnosis not present

## 2019-12-21 LAB — CUP PACEART REMOTE DEVICE CHECK
Battery Remaining Longevity: 10 mo
Battery Remaining Percentage: 10 %
Battery Voltage: 2.68 V
Brady Statistic AP VP Percent: 91 %
Brady Statistic AP VS Percent: 5.9 %
Brady Statistic AS VP Percent: 1.1 %
Brady Statistic AS VS Percent: 1 %
Brady Statistic RA Percent Paced: 93 %
Date Time Interrogation Session: 20210624033019
HighPow Impedance: 39 Ohm
HighPow Impedance: 40 Ohm
Implantable Lead Implant Date: 20140117
Implantable Lead Implant Date: 20140117
Implantable Lead Implant Date: 20140117
Implantable Lead Location: 753858
Implantable Lead Location: 753859
Implantable Lead Location: 753860
Implantable Pulse Generator Implant Date: 20140117
Lead Channel Impedance Value: 440 Ohm
Lead Channel Impedance Value: 440 Ohm
Lead Channel Impedance Value: 930 Ohm
Lead Channel Pacing Threshold Amplitude: 0.75 V
Lead Channel Pacing Threshold Amplitude: 1 V
Lead Channel Pacing Threshold Amplitude: 1.375 V
Lead Channel Pacing Threshold Pulse Width: 0.5 ms
Lead Channel Pacing Threshold Pulse Width: 0.5 ms
Lead Channel Pacing Threshold Pulse Width: 0.5 ms
Lead Channel Sensing Intrinsic Amplitude: 11.6 mV
Lead Channel Sensing Intrinsic Amplitude: 5 mV
Lead Channel Setting Pacing Amplitude: 1.75 V
Lead Channel Setting Pacing Amplitude: 2 V
Lead Channel Setting Pacing Amplitude: 2.375
Lead Channel Setting Pacing Pulse Width: 0.5 ms
Lead Channel Setting Pacing Pulse Width: 0.5 ms
Lead Channel Setting Sensing Sensitivity: 0.5 mV
Pulse Gen Serial Number: 7070427

## 2019-12-22 NOTE — Progress Notes (Signed)
Remote ICD transmission.   

## 2020-01-08 ENCOUNTER — Ambulatory Visit (INDEPENDENT_AMBULATORY_CARE_PROVIDER_SITE_OTHER): Payer: Medicare Other

## 2020-01-08 ENCOUNTER — Other Ambulatory Visit: Payer: Self-pay | Admitting: Physician Assistant

## 2020-01-08 DIAGNOSIS — I420 Dilated cardiomyopathy: Secondary | ICD-10-CM

## 2020-01-08 DIAGNOSIS — E782 Mixed hyperlipidemia: Secondary | ICD-10-CM

## 2020-01-08 DIAGNOSIS — Z95810 Presence of automatic (implantable) cardiac defibrillator: Secondary | ICD-10-CM

## 2020-01-08 DIAGNOSIS — I5023 Acute on chronic systolic (congestive) heart failure: Secondary | ICD-10-CM

## 2020-01-08 DIAGNOSIS — I10 Essential (primary) hypertension: Secondary | ICD-10-CM

## 2020-01-08 DIAGNOSIS — I5022 Chronic systolic (congestive) heart failure: Secondary | ICD-10-CM | POA: Diagnosis not present

## 2020-01-09 ENCOUNTER — Telehealth: Payer: Self-pay

## 2020-01-09 NOTE — Telephone Encounter (Signed)
Remote ICM transmission received.  Attempted call to patient regarding ICM remote transmission and no answer or answering machine. 

## 2020-01-09 NOTE — Progress Notes (Signed)
EPIC Encounter for ICM Monitoring  Patient Name: Earl Schweitzer Sr. is a 79 y.o. male Date: 01/09/2020 Primary Care Physican: Lavada Mesi Primary Cardiologist:Crenshaw Electrophysiologist: Allred Bi-V Pacing: 92% 6/14/2021OfficeWeight:226lbs  AT/AF Burden1.2%  Battery ERI: Approximately 9.5 months   Attempted call to patient and unable to reach.   Transmission reviewed.   CorVue thoracic impedance normal.  Prescribed:  Furosemide 40 mg1tablet (40 mg total) daily.  Spironolactone 25 mg take 0.5 tablet (12.5 mg total) daily  Eliquis 5 mg take 1 tablet twice a day.  Labs: 12/11/2019 Creatinine 1.22, BUN 23, Potassium 4.5, Sodium 141, GFR 56-65 A complete set of results can be found in Results Review.  Recommendations:Unable to reach.    Follow-up plan: ICM clinic phone appointment on8/16/2021.91 day device clinic remote transmission9/23/2021.    EP/Cardiology Office Visits: Recall 06/07/2020 with Dr. Rayann Heman.  Last office visit with Dr [name] was 07/13/2018   Copy of ICM check sent to Dr. Rayann Heman.   3 month ICM trend: 01/08/2020    1 Year ICM trend:       Rosalene Billings, RN 01/09/2020 11:01 AM

## 2020-01-23 ENCOUNTER — Other Ambulatory Visit: Payer: Self-pay | Admitting: Physician Assistant

## 2020-01-23 DIAGNOSIS — I420 Dilated cardiomyopathy: Secondary | ICD-10-CM

## 2020-01-23 DIAGNOSIS — I5023 Acute on chronic systolic (congestive) heart failure: Secondary | ICD-10-CM

## 2020-01-26 ENCOUNTER — Other Ambulatory Visit: Payer: Self-pay | Admitting: Internal Medicine

## 2020-01-26 NOTE — Telephone Encounter (Signed)
Prescription refill request for Eliquis received.  Last office visit: Allred, 06/18/2019 Scr:  1.22, 12/13/2019 Age: 79 y.o. Weight: 102.5 kg   Prescription refill sent.

## 2020-02-12 ENCOUNTER — Ambulatory Visit (INDEPENDENT_AMBULATORY_CARE_PROVIDER_SITE_OTHER): Payer: Medicare Other

## 2020-02-12 DIAGNOSIS — Z95810 Presence of automatic (implantable) cardiac defibrillator: Secondary | ICD-10-CM

## 2020-02-12 DIAGNOSIS — I5022 Chronic systolic (congestive) heart failure: Secondary | ICD-10-CM

## 2020-02-14 NOTE — Progress Notes (Signed)
EPIC Encounter for ICM Monitoring  Patient Name: Earl Mclaren Sr. is a 79 y.o. male Date: 02/14/2020 Primary Care Physican: Lavada Mesi Primary Cardiologist:Crenshaw Electrophysiologist: Allred Bi-V Pacing: 92% 6/14/2021OfficeWeight:226lbs  AT/AF Burden1.0%  Battery ERI: Approximately 8.2 months   Spoke with patient and reports feeling well at this time.  Denies fluid symptoms.  .  CorVue thoracic impedance trending slightly below baseline normal.  Prescribed:  Furosemide 40 mg1tablet (40 mg total) daily.  Spironolactone 25 mg take 0.5 tablet (12.5 mg total) daily  Eliquis 5 mg take 1 tablet twice a day.  Labs: 12/11/2019 Creatinine 1.22, BUN 23, Potassium 4.5, Sodium 141, GFR 56-65 A complete set of results can be found in Results Review.  Recommendations:Recommendation to limit salt intake.  Encouraged to call if experiencing any fluid symptoms.   Follow-up plan: ICM clinic phone appointment on9/24/2021.91 day device clinic remote transmission9/23/2021.    EP/Cardiology Office Visits: Recall 06/07/2020 with Dr. Rayann Heman.  Last office visit with Dr [name] was 07/13/2018   Copy of ICM check sent to Dr. Rayann Heman.   3 month ICM trend: 02/12/2020    1 Year ICM trend:       Rosalene Billings, RN 02/14/2020 1:42 PM

## 2020-03-15 ENCOUNTER — Other Ambulatory Visit: Payer: Self-pay | Admitting: Physician Assistant

## 2020-03-15 DIAGNOSIS — I5023 Acute on chronic systolic (congestive) heart failure: Secondary | ICD-10-CM

## 2020-03-15 DIAGNOSIS — I420 Dilated cardiomyopathy: Secondary | ICD-10-CM

## 2020-03-15 DIAGNOSIS — I10 Essential (primary) hypertension: Secondary | ICD-10-CM

## 2020-03-18 NOTE — Telephone Encounter (Signed)
Unclear if he should still be on medication.  Last filled 01/08/2020 #60 no refills Last seen 12/11/2019

## 2020-03-21 ENCOUNTER — Ambulatory Visit (INDEPENDENT_AMBULATORY_CARE_PROVIDER_SITE_OTHER): Payer: Medicare Other | Admitting: Emergency Medicine

## 2020-03-21 DIAGNOSIS — I5022 Chronic systolic (congestive) heart failure: Secondary | ICD-10-CM

## 2020-03-22 ENCOUNTER — Ambulatory Visit (INDEPENDENT_AMBULATORY_CARE_PROVIDER_SITE_OTHER): Payer: Medicare Other

## 2020-03-22 DIAGNOSIS — Z95810 Presence of automatic (implantable) cardiac defibrillator: Secondary | ICD-10-CM | POA: Diagnosis not present

## 2020-03-22 DIAGNOSIS — I5022 Chronic systolic (congestive) heart failure: Secondary | ICD-10-CM

## 2020-03-22 LAB — CUP PACEART REMOTE DEVICE CHECK
Battery Remaining Longevity: 7 mo
Battery Remaining Longevity: 7 mo
Battery Remaining Percentage: 7 %
Battery Remaining Percentage: 7 %
Battery Voltage: 2.65 V
Battery Voltage: 2.65 V
Brady Statistic AP VP Percent: 92 %
Brady Statistic AP VP Percent: 91 %
Brady Statistic AP VS Percent: 5.6 %
Brady Statistic AP VS Percent: 5.6 %
Brady Statistic AS VP Percent: 1 %
Brady Statistic AS VP Percent: 1 %
Brady Statistic AS VS Percent: 1 %
Brady Statistic AS VS Percent: 1 %
Brady Statistic RA Percent Paced: 94 %
Brady Statistic RA Percent Paced: 94 %
Date Time Interrogation Session: 20210923020018
Date Time Interrogation Session: 20210924175413
HighPow Impedance: 42 Ohm
HighPow Impedance: 42 Ohm
HighPow Impedance: 44 Ohm
HighPow Impedance: 44 Ohm
Implantable Lead Implant Date: 20140117
Implantable Lead Implant Date: 20140117
Implantable Lead Implant Date: 20140117
Implantable Lead Implant Date: 20140117
Implantable Lead Implant Date: 20140117
Implantable Lead Implant Date: 20140117
Implantable Lead Location: 753858
Implantable Lead Location: 753859
Implantable Lead Location: 753860
Implantable Lead Location: 753858
Implantable Lead Location: 753859
Implantable Lead Location: 753860
Implantable Pulse Generator Implant Date: 20140117
Implantable Pulse Generator Implant Date: 20140117
Lead Channel Impedance Value: 430 Ohm
Lead Channel Impedance Value: 450 Ohm
Lead Channel Impedance Value: 910 Ohm
Lead Channel Impedance Value: 410 Ohm
Lead Channel Impedance Value: 440 Ohm
Lead Channel Impedance Value: 940 Ohm
Lead Channel Pacing Threshold Amplitude: 0.75 V
Lead Channel Pacing Threshold Amplitude: 0.875 V
Lead Channel Pacing Threshold Amplitude: 1.25 V
Lead Channel Pacing Threshold Amplitude: 0.75 V
Lead Channel Pacing Threshold Amplitude: 0.875 V
Lead Channel Pacing Threshold Amplitude: 1.25 V
Lead Channel Pacing Threshold Pulse Width: 0.5 ms
Lead Channel Pacing Threshold Pulse Width: 0.5 ms
Lead Channel Pacing Threshold Pulse Width: 0.5 ms
Lead Channel Pacing Threshold Pulse Width: 0.5 ms
Lead Channel Pacing Threshold Pulse Width: 0.5 ms
Lead Channel Pacing Threshold Pulse Width: 0.5 ms
Lead Channel Sensing Intrinsic Amplitude: 10 mV
Lead Channel Sensing Intrinsic Amplitude: 5 mV
Lead Channel Sensing Intrinsic Amplitude: 11.6 mV
Lead Channel Sensing Intrinsic Amplitude: 5 mV
Lead Channel Setting Pacing Amplitude: 1.75 V
Lead Channel Setting Pacing Amplitude: 2 V
Lead Channel Setting Pacing Amplitude: 2.25 V
Lead Channel Setting Pacing Amplitude: 1.75 V
Lead Channel Setting Pacing Amplitude: 2 V
Lead Channel Setting Pacing Amplitude: 2.25 V
Lead Channel Setting Pacing Pulse Width: 0.5 ms
Lead Channel Setting Pacing Pulse Width: 0.5 ms
Lead Channel Setting Pacing Pulse Width: 0.5 ms
Lead Channel Setting Pacing Pulse Width: 0.5 ms
Lead Channel Setting Sensing Sensitivity: 0.5 mV
Lead Channel Setting Sensing Sensitivity: 0.5 mV
Pulse Gen Serial Number: 7070427
Pulse Gen Serial Number: 7070427

## 2020-03-22 NOTE — Progress Notes (Signed)
EPIC Encounter for ICM Monitoring  Patient Name: Earl White Sr. is a 79 y.o. male Date: 03/22/2020 Primary Care Physican: Lavada Mesi Primary Cardiologist:Crenshaw Electrophysiologist: Allred Bi-V Pacing: 92% 9/24/2021Weight:218lbs  AT/AF Burden<1.0%  Battery ERI: Approximately 7.1 months   Spoke with patient and reports feeling well at this time.  Denies fluid symptoms. He has been working in the yard and around the house.  Eating more fruits and vegetables.  CorVue thoracic impedance normal.  Prescribed:  Furosemide 40 mg1tablet (40 mg total) daily.  Spironolactone 25 mg take 0.5 tablet (12.5 mg total) daily  Eliquis 5 mg take 1 tablet twice a day.  Labs: 12/11/2019 Creatinine1.22, BUN23, Potassium4.5, [id number], [id number] A complete set of results can be found in Results Review.  Recommendations:  Encouraged to call if experiencing any fluid symptoms.   Follow-up plan: ICM clinic phone appointment on10/25/2021.91 day device clinic remote transmissionnot scheduled yet.  EP/Cardiology Office Visits:Recall 06/07/2020 with Dr.Allred.Last office visit with Dr [name] was 07/13/2018  Copy of ICM check sent to Dr.Allred.   3 month ICM trend: 03/21/2020    1 Year ICM trend:       Rosalene Billings, RN 03/22/2020 3:24 PM

## 2020-03-25 ENCOUNTER — Other Ambulatory Visit: Payer: Self-pay | Admitting: Physician Assistant

## 2020-03-25 DIAGNOSIS — E1122 Type 2 diabetes mellitus with diabetic chronic kidney disease: Secondary | ICD-10-CM

## 2020-03-26 ENCOUNTER — Other Ambulatory Visit: Payer: Self-pay | Admitting: Physician Assistant

## 2020-03-26 DIAGNOSIS — I420 Dilated cardiomyopathy: Secondary | ICD-10-CM

## 2020-03-26 DIAGNOSIS — I5023 Acute on chronic systolic (congestive) heart failure: Secondary | ICD-10-CM

## 2020-03-26 NOTE — Progress Notes (Signed)
Remote ICD transmission.   

## 2020-04-03 ENCOUNTER — Other Ambulatory Visit: Payer: Self-pay | Admitting: Physician Assistant

## 2020-04-03 DIAGNOSIS — I420 Dilated cardiomyopathy: Secondary | ICD-10-CM

## 2020-04-03 DIAGNOSIS — I5023 Acute on chronic systolic (congestive) heart failure: Secondary | ICD-10-CM

## 2020-04-06 ENCOUNTER — Other Ambulatory Visit: Payer: Self-pay | Admitting: Physician Assistant

## 2020-04-06 DIAGNOSIS — I5023 Acute on chronic systolic (congestive) heart failure: Secondary | ICD-10-CM

## 2020-04-06 DIAGNOSIS — I420 Dilated cardiomyopathy: Secondary | ICD-10-CM

## 2020-04-15 ENCOUNTER — Other Ambulatory Visit: Payer: Self-pay | Admitting: Physician Assistant

## 2020-04-15 DIAGNOSIS — E1122 Type 2 diabetes mellitus with diabetic chronic kidney disease: Secondary | ICD-10-CM

## 2020-04-15 NOTE — Telephone Encounter (Signed)
Patient PAST due for diabetes follow up with Jade.   Please call and get scheduled so that we can refill meds

## 2020-04-16 NOTE — Telephone Encounter (Signed)
Diabetic f/u scheduled with Jade for next Friday, 04/26/2020. AM

## 2020-04-22 ENCOUNTER — Ambulatory Visit (INDEPENDENT_AMBULATORY_CARE_PROVIDER_SITE_OTHER): Payer: Medicare Other

## 2020-04-22 DIAGNOSIS — Z95810 Presence of automatic (implantable) cardiac defibrillator: Secondary | ICD-10-CM

## 2020-04-22 DIAGNOSIS — I5022 Chronic systolic (congestive) heart failure: Secondary | ICD-10-CM | POA: Diagnosis not present

## 2020-04-24 NOTE — Progress Notes (Signed)
EPIC Encounter for ICM Monitoring  Patient Name: Earl Klemp Sr. is a 79 y.o. male Date: 04/24/2020 Primary Care Physican: Lavada Mesi Primary Cardiologist:Crenshaw Electrophysiologist: Allred Bi-V Pacing: 91% 9/24/2021Weight:218lbs  AT/AF Burden<1%  Battery ERI: Approximately6.9 months   Spoke with patient and reports feeling well at this time.  Denies fluid symptoms.    CorVue thoracic impedance normal but was suggesting possible fluid accumulation from 10/5-10/13.  Prescribed:  Furosemide 40 mg1tablet (40 mg total) daily.  Spironolactone 25 mg take 0.5 tablet (12.5 mg total) daily  Eliquis 5 mg take 1 tablet twice a day.  Labs: 12/11/2019 Creatinine1.22, BUN23, Potassium4.5, [id number], [id number] A complete set of results can be found in Results Review.  Recommendations: Encouraged to call if experiencing any fluid symptoms.   Follow-up plan: ICM clinic phone appointment on11/29/2021.91 day device clinic remote transmission1/27/2022.  EP/Cardiology Office Visits:Recall 06/07/2020 with Dr.Allred.Provided office number to call Dr [name] to schedule an appointment.   Copy of ICM check sent to Dr.Allred.   3 month ICM trend: 04/22/2020    1 Year ICM trend:       Rosalene Billings, RN 04/24/2020 2:21 PM

## 2020-04-25 DIAGNOSIS — Z23 Encounter for immunization: Secondary | ICD-10-CM | POA: Diagnosis not present

## 2020-04-26 ENCOUNTER — Ambulatory Visit (INDEPENDENT_AMBULATORY_CARE_PROVIDER_SITE_OTHER): Payer: Medicare Other | Admitting: Physician Assistant

## 2020-04-26 ENCOUNTER — Encounter: Payer: Self-pay | Admitting: Physician Assistant

## 2020-04-26 VITALS — BP 106/67 | HR 60 | Temp 99.1°F | Wt 235.0 lb

## 2020-04-26 DIAGNOSIS — E1122 Type 2 diabetes mellitus with diabetic chronic kidney disease: Secondary | ICD-10-CM

## 2020-04-26 DIAGNOSIS — I10 Essential (primary) hypertension: Secondary | ICD-10-CM | POA: Diagnosis not present

## 2020-04-26 DIAGNOSIS — I5022 Chronic systolic (congestive) heart failure: Secondary | ICD-10-CM | POA: Diagnosis not present

## 2020-04-26 DIAGNOSIS — I420 Dilated cardiomyopathy: Secondary | ICD-10-CM | POA: Diagnosis not present

## 2020-04-26 DIAGNOSIS — Z23 Encounter for immunization: Secondary | ICD-10-CM

## 2020-04-26 DIAGNOSIS — E785 Hyperlipidemia, unspecified: Secondary | ICD-10-CM | POA: Diagnosis not present

## 2020-04-26 DIAGNOSIS — I480 Paroxysmal atrial fibrillation: Secondary | ICD-10-CM | POA: Diagnosis not present

## 2020-04-26 DIAGNOSIS — I255 Ischemic cardiomyopathy: Secondary | ICD-10-CM

## 2020-04-26 LAB — POCT GLYCOSYLATED HEMOGLOBIN (HGB A1C): Hemoglobin A1C: 6.5 % — AB (ref 4.0–5.6)

## 2020-04-26 MED ORDER — SYNJARDY XR 10-1000 MG PO TB24: 1.0000 | ORAL_TABLET | Freq: Every day | ORAL | 0 refills | Status: DC

## 2020-04-26 MED ORDER — TRULICITY 1.5 MG/0.5ML ~~LOC~~ SOAJ: SUBCUTANEOUS | 0 refills | Status: DC

## 2020-04-26 NOTE — Progress Notes (Signed)
Subjective:    Patient ID: Earl Hatcher Sr., male    DOB: [birth year], 79 y.o.   MRN: [id number]  HPI  Pt is a 79 yo male with T2DM, cardiomyopathy, CHF, A.fib who presents to the clinic for 3 month follow up.   He is doing well. No CP, palpitations, headaches, dizziness. SOB is stable. No extremity weakness or swelling.  Doing well on medications and compliant.   He is not checking sugars. He denies any hypoglycemic events. Taking trulicty and synjardy like he should. No open sores or wounds.   .. Active Ambulatory Problems    Diagnosis Date Noted  . Essential hypertension, benign 04/28/2013  . BPH (benign prostatic hyperplasia) 04/28/2013  . Gout 04/28/2013  . Congestive dilated cardiomyopathy ([place]) 05/10/2013  . ICD (implantable cardioverter-defibrillator) in place 05/10/2013  . Elevated PSA 11/24/2013  . Systolic dysfunction with acute on chronic heart failure ([place]) 09/03/2014  . Morbid obesity ([place]) 09/03/2014  . Erectile dysfunction 03/10/2015  . Lower extremity edema 09/29/2017  . Abnormal ultrasound of lower extremity 09/29/2017  . Malignant neoplasm of prostate ([place]) 02/03/2018  . Family history of cancer 02/03/2018  . Family history of prostate cancer   . Family history of breast cancer   . Genetic testing 03/25/2018  . Hypotension 05/23/2018  . Paroxysmal atrial fibrillation ([place]) 12/25/2015  . History of gout 08/01/2018  . Type 2 diabetes mellitus with chronic kidney disease, 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[place]) 08/01/2018  . Acute left-sided low back pain without sciatica 05/16/2019  . Syncope and collapse 05/24/2019  . Chronic systolic heart failure ([place]) 06/08/2019  . 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 Fall 08/16/2019  . Impetigo 08/18/2019  . Rectal bleeding 12/11/2019  . Hyperlipidemia LDL goal <70 12/11/2019   Resolved Ambulatory Problems    Diagnosis Date Noted  . Diabetes mellitus due to underlying condition, controlled, without  complication, 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[place]) 04/28/2013   Past Medical History:  Diagnosis Date  . CHF (congestive heart failure) ([place])   . Diabetes mellitus ([place])   . Hyperlipidemia   . Hypertension   . Nonischemic cardiomyopathy ([place])   . Prostate cancer [facility])         Review of Systems  All other systems reviewed and are negative.      Objective:   Physical Exam Vitals reviewed.  Constitutional:      Appearance: Normal appearance. He is obese.  HENT:     Head: Normocephalic.  Neck:     Vascular: No carotid bruit.  Cardiovascular:     Rate and Rhythm: Normal rate and regular rhythm.     Pulses: Normal pulses.     Heart sounds: Murmur heard.   Pulmonary:     Effort: Pulmonary effort is normal.     Breath sounds: Normal breath sounds.  Musculoskeletal:     Right lower leg: No edema.     Left lower leg: No edema.  Neurological:     General: No focal deficit present.     Mental Status: He is alert and oriented to person, place, and time.  Psychiatric:        Mood and Affect: Mood normal.           Assessment & Plan:  [name] KitchenMarland KitchenFount was seen today for diabetes.  Diagnoses and all orders for this visit:  Type 2 diabetes mellitus with chronic kidney disease, without long-term current use of insulin, unspecified CKD stage ([place]) -  POCT HgB A1C -     Empagliflozin-metFORMIN HCl ER (SYNJARDY XR) 03-999 MG TB24; Take 1 tablet by mouth daily. -     Dulaglutide (TRULICITY) 1.5 RW/4.3XV SOPN; INJECT 1.5 MG SUBCUTANEOUSLY ONCE A WEEK . -     Ambulatory referral to Ophthalmology  Need for influenza vaccination -     Flu Vaccine QUAD High Dose(Fluad)  Chronic systolic heart failure (HCC)  Congestive dilated cardiomyopathy ([place])  Essential hypertension, benign  Paroxysmal atrial fibrillation (HCC)  Hyperlipidemia LDL goal <70   .[name] Lab Results  Component Value Date   HGBA1C 6.5 (A) 04/26/2020    A!C to goal. A little up from 3 months  ago.  Discussed better DM diet.  BP to goal. On ACE.  On statin.  Continue trulicity and synjardy.  Foot exam UTD.  Ordered DM eye exam.  Covid/pneumonia/flu shot UTD.  Labs UTD.  Follow up in 3 months.   Continue to be followed by cardiology for cardiomyopathy/CHF. Seems to be controlled today.

## 2020-04-28 ENCOUNTER — Other Ambulatory Visit: Payer: Self-pay | Admitting: Cardiology

## 2020-04-28 DIAGNOSIS — I420 Dilated cardiomyopathy: Secondary | ICD-10-CM

## 2020-04-28 DIAGNOSIS — I10 Essential (primary) hypertension: Secondary | ICD-10-CM

## 2020-04-28 DIAGNOSIS — I5023 Acute on chronic systolic (congestive) heart failure: Secondary | ICD-10-CM

## 2020-04-29 ENCOUNTER — Encounter: Payer: Self-pay | Admitting: Physician Assistant

## 2020-04-29 ENCOUNTER — Telehealth: Payer: Self-pay

## 2020-04-29 LAB — CUP PACEART REMOTE DEVICE CHECK
Battery Remaining Longevity: 7 mo
Battery Remaining Percentage: 7 %
Battery Voltage: 2.65 V
Brady Statistic AP VP Percent: 91 %
Brady Statistic AP VS Percent: 5.9 %
Brady Statistic AS VP Percent: 1 %
Brady Statistic AS VS Percent: 1 %
Brady Statistic RA Percent Paced: 94 %
Date Time Interrogation Session: 20211025020028
HighPow Impedance: 40 Ohm
HighPow Impedance: 40 Ohm
Implantable Lead Implant Date: 20140117
Implantable Lead Implant Date: 20140117
Implantable Lead Implant Date: 20140117
Implantable Lead Location: 753858
Implantable Lead Location: 753859
Implantable Lead Location: 753860
Implantable Pulse Generator Implant Date: 20140117
Lead Channel Impedance Value: 410 Ohm
Lead Channel Impedance Value: 430 Ohm
Lead Channel Impedance Value: 860 Ohm
Lead Channel Pacing Threshold Amplitude: 0.75 V
Lead Channel Pacing Threshold Amplitude: 0.875 V
Lead Channel Pacing Threshold Amplitude: 1.25 V
Lead Channel Pacing Threshold Pulse Width: 0.5 ms
Lead Channel Pacing Threshold Pulse Width: 0.5 ms
Lead Channel Pacing Threshold Pulse Width: 0.5 ms
Lead Channel Sensing Intrinsic Amplitude: 5 mV
Lead Channel Sensing Intrinsic Amplitude: 7.7 mV
Lead Channel Setting Pacing Amplitude: 1.75 V
Lead Channel Setting Pacing Amplitude: 2 V
Lead Channel Setting Pacing Amplitude: 2.25 V
Lead Channel Setting Pacing Pulse Width: 0.5 ms
Lead Channel Setting Pacing Pulse Width: 0.5 ms
Lead Channel Setting Sensing Sensitivity: 0.5 mV
Pulse Gen Serial Number: 7070427

## 2020-04-29 NOTE — Telephone Encounter (Signed)
Merlin alert received 04/28/20 for 2 NSVT 180's and 1 VF event. 04/27/20 @ 6:45 PM 1 VF event occurred, 203 bpm, duration 22 seconds with 1 successful shock. Patient called. Reports compliance with all medications including Coreg 25 mg BID, Eliquis 5 mg BID, Entresto 97-103 mg BID, Lasix 40 mg daily.Patient reports he was helping his son move that day and over exerted himself. States he was not feel well during moving, took 2 baby ASA. Reports when he was in the car riding he felt the shock and suddenly felt much better. Patient denies any complaints at this time.  Shock plan/driving restrictions reviewed with patient. Verbalizes understanding.  Advised patient I will forward to Dr. Rayann Heman for review and recommendations. We will call with any changes. Verbalized understanding.

## 2020-05-11 NOTE — Telephone Encounter (Signed)
He is due for EP follow-up in early December  [place], please schedule

## 2020-05-21 ENCOUNTER — Other Ambulatory Visit: Payer: Self-pay | Admitting: Physician Assistant

## 2020-05-21 DIAGNOSIS — E1122 Type 2 diabetes mellitus with diabetic chronic kidney disease: Secondary | ICD-10-CM

## 2020-05-27 ENCOUNTER — Ambulatory Visit (INDEPENDENT_AMBULATORY_CARE_PROVIDER_SITE_OTHER): Payer: BC Managed Care – PPO

## 2020-05-27 ENCOUNTER — Ambulatory Visit (INDEPENDENT_AMBULATORY_CARE_PROVIDER_SITE_OTHER): Payer: Medicare Other

## 2020-05-27 DIAGNOSIS — I255 Ischemic cardiomyopathy: Secondary | ICD-10-CM

## 2020-05-27 DIAGNOSIS — I5022 Chronic systolic (congestive) heart failure: Secondary | ICD-10-CM

## 2020-05-27 DIAGNOSIS — Z95810 Presence of automatic (implantable) cardiac defibrillator: Secondary | ICD-10-CM | POA: Diagnosis not present

## 2020-05-27 LAB — CUP PACEART REMOTE DEVICE CHECK
Battery Remaining Longevity: 6 mo
Battery Remaining Percentage: 6 %
Battery Voltage: 2.63 V
Brady Statistic AP VP Percent: 91 %
Brady Statistic AP VS Percent: 5.6 %
Brady Statistic AS VP Percent: 1 %
Brady Statistic AS VS Percent: 1 %
Brady Statistic RA Percent Paced: 94 %
Date Time Interrogation Session: 20211129031231
HighPow Impedance: 39 Ohm
HighPow Impedance: 39 Ohm
Implantable Lead Implant Date: 20140117
Implantable Lead Implant Date: 20140117
Implantable Lead Implant Date: 20140117
Implantable Lead Location: 753858
Implantable Lead Location: 753859
Implantable Lead Location: 753860
Implantable Pulse Generator Implant Date: 20140117
Lead Channel Impedance Value: 400 Ohm
Lead Channel Impedance Value: 410 Ohm
Lead Channel Impedance Value: 810 Ohm
Lead Channel Pacing Threshold Amplitude: 0.75 V
Lead Channel Pacing Threshold Amplitude: 0.875 V
Lead Channel Pacing Threshold Amplitude: 1.5 V
Lead Channel Pacing Threshold Pulse Width: 0.5 ms
Lead Channel Pacing Threshold Pulse Width: 0.5 ms
Lead Channel Pacing Threshold Pulse Width: 0.5 ms
Lead Channel Sensing Intrinsic Amplitude: 11.6 mV
Lead Channel Sensing Intrinsic Amplitude: 5 mV
Lead Channel Setting Pacing Amplitude: 1.75 V
Lead Channel Setting Pacing Amplitude: 2 V
Lead Channel Setting Pacing Amplitude: 2.5 V
Lead Channel Setting Pacing Pulse Width: 0.5 ms
Lead Channel Setting Pacing Pulse Width: 0.5 ms
Lead Channel Setting Sensing Sensitivity: 0.5 mV
Pulse Gen Serial Number: 7070427

## 2020-05-31 ENCOUNTER — Telehealth: Payer: Self-pay

## 2020-05-31 NOTE — Telephone Encounter (Signed)
Remote ICM transmission received.  Attempted call to patient regarding ICM remote transmission and no answer or voice mail option.  

## 2020-05-31 NOTE — Progress Notes (Signed)
EPIC Encounter for ICM Monitoring  Patient Name: Earl Hochstetler Sr. is a 79 y.o. male Date: 05/31/2020 Primary Care Physican: Lavada Mesi Primary Cardiologist:Crenshaw Electrophysiologist: Allred Bi-V Pacing: 92% 9/24/2021Weight:218lbs  AT/AF Burden<1%  Battery ERI: Approximately5.76months   Attempted call to patient and unable to reach.  Transmission reviewed.   CorVue thoracic impedance normal.  Prescribed:  Furosemide 40 mg1tablet (40 mg total) daily.  Spironolactone 25 mg take 0.5 tablet (12.5 mg total) daily  Eliquis 5 mg take 1 tablet twice a day.  Labs: 12/11/2019 Creatinine1.22, BUN23, Potassium4.5, [id number], [id number] A complete set of results can be found in Results Review.  Recommendations: Unable to reach.    Follow-up plan: ICM clinic phone appointment on1/09/2020.91 day device clinic remote transmission1/27/2022.  EP/Cardiology Office Visits:06/20/2020 with Dr.Allred.Provided office number to call Dr [name] to schedule an appointment.   Copy of ICM check sent to Dr.Allred.   3 month ICM trend: 05/27/2020    1 Year ICM trend:       Rosalene Billings, RN 05/31/2020 10:43 AM

## 2020-06-03 NOTE — Progress Notes (Signed)
Remote ICD transmission.   

## 2020-06-12 LAB — HM DIABETES EYE EXAM

## 2020-06-13 ENCOUNTER — Encounter: Payer: Self-pay | Admitting: Neurology

## 2020-06-20 ENCOUNTER — Ambulatory Visit (INDEPENDENT_AMBULATORY_CARE_PROVIDER_SITE_OTHER): Payer: Medicare Other | Admitting: Internal Medicine

## 2020-06-20 ENCOUNTER — Encounter: Payer: Self-pay | Admitting: Internal Medicine

## 2020-06-20 ENCOUNTER — Other Ambulatory Visit: Payer: Self-pay

## 2020-06-20 VITALS — BP 142/84 | HR 62 | Ht 70.0 in | Wt 240.8 lb

## 2020-06-20 DIAGNOSIS — I472 Ventricular tachycardia, unspecified: Secondary | ICD-10-CM

## 2020-06-20 DIAGNOSIS — I10 Essential (primary) hypertension: Secondary | ICD-10-CM | POA: Diagnosis not present

## 2020-06-20 DIAGNOSIS — I480 Paroxysmal atrial fibrillation: Secondary | ICD-10-CM | POA: Diagnosis not present

## 2020-06-20 DIAGNOSIS — I255 Ischemic cardiomyopathy: Secondary | ICD-10-CM | POA: Diagnosis not present

## 2020-06-20 DIAGNOSIS — D6869 Other thrombophilia: Secondary | ICD-10-CM | POA: Diagnosis not present

## 2020-06-20 DIAGNOSIS — I5022 Chronic systolic (congestive) heart failure: Secondary | ICD-10-CM | POA: Diagnosis not present

## 2020-06-20 DIAGNOSIS — Z95810 Presence of automatic (implantable) cardiac defibrillator: Secondary | ICD-10-CM | POA: Diagnosis not present

## 2020-06-20 LAB — CUP PACEART REMOTE DEVICE CHECK
Battery Remaining Longevity: 4 mo
Battery Remaining Percentage: 4 %
Battery Voltage: 2.62 V
Brady Statistic AP VP Percent: 91 %
Brady Statistic AP VS Percent: 5.4 %
Brady Statistic AS VP Percent: 1.1 %
Brady Statistic AS VS Percent: 1 %
Brady Statistic RA Percent Paced: 94 %
Date Time Interrogation Session: 20211223020018
HighPow Impedance: 41 Ohm
HighPow Impedance: 41 Ohm
Implantable Lead Implant Date: 20140117
Implantable Lead Implant Date: 20140117
Implantable Lead Implant Date: 20140117
Implantable Lead Location: 753858
Implantable Lead Location: 753859
Implantable Lead Location: 753860
Implantable Pulse Generator Implant Date: 20140117
Lead Channel Impedance Value: 430 Ohm
Lead Channel Impedance Value: 440 Ohm
Lead Channel Impedance Value: 860 Ohm
Lead Channel Pacing Threshold Amplitude: 0.75 V
Lead Channel Pacing Threshold Amplitude: 0.875 V
Lead Channel Pacing Threshold Amplitude: 1.375 V
Lead Channel Pacing Threshold Pulse Width: 0.5 ms
Lead Channel Pacing Threshold Pulse Width: 0.5 ms
Lead Channel Pacing Threshold Pulse Width: 0.5 ms
Lead Channel Sensing Intrinsic Amplitude: 11.6 mV
Lead Channel Sensing Intrinsic Amplitude: 5 mV
Lead Channel Setting Pacing Amplitude: 1.75 V
Lead Channel Setting Pacing Amplitude: 2 V
Lead Channel Setting Pacing Amplitude: 2.375
Lead Channel Setting Pacing Pulse Width: 0.5 ms
Lead Channel Setting Pacing Pulse Width: 0.5 ms
Lead Channel Setting Sensing Sensitivity: 0.5 mV
Pulse Gen Serial Number: 7070427

## 2020-06-20 NOTE — Progress Notes (Signed)
PCP: Donella Stade, PA-C Primary Cardiologist: Dr [name] Primary EP: Dr Rayann Heman  Earl Hatcher Sr. is a 79 y.o. male who presents today for routine electrophysiology followup.  Since last being seen in our clinic, the patient reports doing very well.  He has had several appropriate ICD shocks for VT since I saw him last.  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  He feels clear that he "over did it".  Today, he denies symptoms of palpitations, chest pain, shortness of breath,  lower extremity edema, or dizziness,.  The patient is otherwise without complaint today.   Past Medical History:  Diagnosis Date   BPH (benign prostatic hyperplasia)    CHF (congestive heart failure) ([place])    Diabetes mellitus ([place])    Type II. Diet controlled   Family history of breast cancer    Family history of prostate cancer    Gout    Hyperlipidemia    Hypertension    Nonischemic cardiomyopathy ([place])    a. s/p STJ CRTD   Paroxysmal atrial fibrillation ([place]) 12/25/15   Prostate cancer [facility])    Past Surgical History:  Procedure Laterality Date   CARDIAC DEFIBRILLATOR PLACEMENT  Jan 2014   SJM Quadra Assura implanted by Dr [name] in [place] are reviewed and negative except as per HPI above  Current Outpatient Medications  Medication Sig Dispense Refill   AMBULATORY NON FORMULARY MEDICATION shingrx 2 doses to prevent shingles. 2 application 0   atorvastatin (LIPITOR) 80 MG tablet Take 1 tablet by mouth once daily 90 tablet 3   carvedilol (COREG) 25 MG tablet TAKE 1 TABLET BY MOUTH TWICE DAILY WITH MEALS 60 tablet 1   Dulaglutide (TRULICITY) 1.5 QQ/5.9DG SOPN INJECT 1.5 MG SUBCUTANEOUSLY ONCE A WEEK . 6 mL 0   ELIQUIS 5 MG TABS tablet Take 1 tablet by mouth twice daily 180 tablet 1   Empagliflozin-metFORMIN HCl ER (SYNJARDY XR) 03-999 MG TB24 Take 1 tablet by mouth daily. 90 tablet 0   ENTRESTO 97-103 MG Take 1 tablet by mouth  twice daily 180 tablet 3   furosemide (LASIX) 40 MG tablet Take 1 tablet by mouth once daily 90 tablet 0   sennosides-docusate sodium (SENOKOT-S) 8.6-50 MG tablet Take 1 tablet by mouth daily as needed. For constipation     sildenafil (VIAGRA) 100 MG tablet TAKE 1 TABLET BY MOUTH AS NEEDED FOR  ERECTILE  DYSFUNCTION 10 tablet 2   spironolactone (ALDACTONE) 25 MG tablet Take 0.5 tablets (12.5 mg total) by mouth once for 1 dose. Needs appt 45 tablet 0   No current facility-administered medications for this visit.    Physical Exam: Vitals:   06/20/20 1517  BP: (!) 142/84  Pulse: 62  SpO2: 90%  Weight: 240 lb 12.8 oz (109.2 kg)  Height: 5\' 10"  (1.778 m)    GEN- The patient is well appearing, alert and oriented x 3 today.   Head- normocephalic, atraumatic Eyes-  Sclera clear, conjunctiva pink Ears- hearing intact Oropharynx- clear Lungs- Clear to ausculation bilaterally, normal work of breathing Chest- ICD pocket is well healed Heart- Regular rate and rhythm, no murmurs, rubs or gallops, PMI not laterally displaced GI- soft, NT, ND, + BS Extremities- no clubbing, cyanosis, or edema  ICD interrogation- reviewed in detail today,  See PACEART report  ekg tracing ordered today is personally reviewed and shows atrial paced, biv paced  Wt Readings from Last 3 Encounters:  06/20/20  240 lb 12.8 oz (109.2 kg)  04/26/20 235 lb (106.6 kg)  12/11/19 226 lb (102.5 kg)    Assessment and Plan:  1.  Chronic systolic dysfunction/ ischemic CM euvolemic today No ischemic symptoms He has not had a prior cath.  Follows with Dr [name]. Stable on an appropriate medical regimen Normal ICD function See Pace Art report No changes today he is not device dependant today followed in ICM device clinic Continue current medical therapy Echo to evaluate EF He is approaching ERI.  We will consider BiV optimization with his new device.  2. VT Episodes have occurred over the past year with  heavy exertion.  We discussed at length. Continue coreg May need to consider AAD if episodes increase In May, he had VT below the detection at 163 bpm.  I have lowered detection today to 160 bpm He is about 4 months to ERI. Risks, benefits, and alternatives to BiV ICD pulse generator replacement were discussed in detail today.  The patient understands that risks include but are not limited to bleeding, infection, pneumothorax, perforation, tamponade, vascular damage, renal failure, MI, stroke, death, inappropriate shocks, damage to his existing leads, and lead dislodgement and wishes to proceed once he is at [facility]. We will hold eliquis 24 hours prior to the procedure and proceed once at [facility].  3. Paroxysmal atrial fibrillation On eliquis <1% AF burden by ICD Hold eliquis for 24 hours prior to BiV ICD gen change as above  4. HTN Stable No change required today  5. HL Continue statin  6. Overweight Body mass index is 34.55 kg/m. lifestyle modification advised  Risks, benefits and potential toxicities for medications prescribed and/or refilled reviewed with patient today.   Thompson Grayer MD, [facility] 06/20/2020 5:15 PM

## 2020-06-20 NOTE — Patient Instructions (Addendum)
Medication Instructions:  Your physician recommends that you continue on your current medications as directed. Please refer to the Current Medication list given to you today.  Labwork: None ordered.  Testing/Procedures: Your physician has requested that you have an echocardiogram. Echocardiography is a painless test that uses sound waves to create images of your heart. It provides your doctor with information about the size and shape of your heart and how well your heart's chambers and valves are working. This procedure takes approximately one hour. There are no restrictions for this procedure.  Please schedule for ECHO  Follow-Up: Your physician wants you to follow-up in: 6 months with Dr. Rayann Heman.     Remote monitoring is used to monitor your ICD from home. This monitoring reduces the number of office visits required to check your device to one time per year. It allows [place] to keep an eye on the functioning of your device to ensure it is working properly. You are scheduled for a device check from home on 07/02/2020. You may send your transmission at any time that day. If you have a wireless device, the transmission will be sent automatically. After your physician reviews your transmission, you will receive a postcard with your next transmission date.  Any Other Special Instructions Will Be Listed Below (If Applicable).  If you need a refill on your cardiac medications before your next appointment, please call your pharmacy.

## 2020-06-28 ENCOUNTER — Other Ambulatory Visit: Payer: Self-pay | Admitting: Physician Assistant

## 2020-06-28 ENCOUNTER — Other Ambulatory Visit: Payer: Self-pay | Admitting: Cardiology

## 2020-06-28 DIAGNOSIS — I420 Dilated cardiomyopathy: Secondary | ICD-10-CM

## 2020-06-28 DIAGNOSIS — I5023 Acute on chronic systolic (congestive) heart failure: Secondary | ICD-10-CM

## 2020-06-28 DIAGNOSIS — I10 Essential (primary) hypertension: Secondary | ICD-10-CM

## 2020-07-01 ENCOUNTER — Other Ambulatory Visit: Payer: Self-pay | Admitting: Cardiology

## 2020-07-01 DIAGNOSIS — I10 Essential (primary) hypertension: Secondary | ICD-10-CM

## 2020-07-01 DIAGNOSIS — I420 Dilated cardiomyopathy: Secondary | ICD-10-CM

## 2020-07-01 DIAGNOSIS — I5023 Acute on chronic systolic (congestive) heart failure: Secondary | ICD-10-CM

## 2020-07-02 ENCOUNTER — Ambulatory Visit (INDEPENDENT_AMBULATORY_CARE_PROVIDER_SITE_OTHER): Payer: Medicare Other

## 2020-07-02 DIAGNOSIS — I5022 Chronic systolic (congestive) heart failure: Secondary | ICD-10-CM

## 2020-07-02 DIAGNOSIS — Z95810 Presence of automatic (implantable) cardiac defibrillator: Secondary | ICD-10-CM | POA: Diagnosis not present

## 2020-07-05 ENCOUNTER — Telehealth: Payer: Self-pay

## 2020-07-05 NOTE — Telephone Encounter (Signed)
Remote ICM transmission received.  Attempted call to patient regarding ICM remote transmission and no answer. Next ICM remote transmission scheduled 08/06/2020.

## 2020-07-05 NOTE — Progress Notes (Signed)
EPIC Encounter for ICM Monitoring  Patient Name: Earl Geno Sr. is a 80 y.o. male Date: 07/05/2020 Primary Care Physican: Lavada Mesi Primary Cardiologist:Crenshaw Electrophysiologist: Allred Bi-V Pacing: 92% 12/32/2021 OfficeWeight:240lbs  AT/AF Burden<1%  Battery ERI: Approximately4.89months per 06/20/20 report   Attempted call to patient and unable to reach.  Transmission reviewed.   CorVue thoracic impedance normal.  Prescribed:  Furosemide 40 mg1tablet (40 mg total) daily.  Spironolactone 25 mg take 0.5 tablet (12.5 mg total) daily  Eliquis 5 mg take 1 tablet twice a day.  Labs: 12/11/2019 Creatinine1.22, BUN23, Potassium4.5, [id number], [id number] A complete set of results can be found in Results Review.  Recommendations: Unable to reach.    Follow-up plan: ICM clinic phone appointment on2/01/2021.91 day device clinic remote transmission1/27/2022.  EP/Cardiology Office Visits:12/23/2020 with Dr Rayann Heman  Copy of ICM check sent to Dr.Allred.  3 month ICM trend: 07/05/2020.    Rosalene Billings, RN 07/05/2020 12:32 PM

## 2020-07-17 ENCOUNTER — Other Ambulatory Visit (HOSPITAL_COMMUNITY): Payer: BC Managed Care – PPO

## 2020-07-19 ENCOUNTER — Other Ambulatory Visit: Payer: Self-pay | Admitting: Physician Assistant

## 2020-07-19 DIAGNOSIS — I5023 Acute on chronic systolic (congestive) heart failure: Secondary | ICD-10-CM

## 2020-07-19 DIAGNOSIS — I10 Essential (primary) hypertension: Secondary | ICD-10-CM

## 2020-07-19 DIAGNOSIS — I420 Dilated cardiomyopathy: Secondary | ICD-10-CM

## 2020-07-25 ENCOUNTER — Ambulatory Visit (INDEPENDENT_AMBULATORY_CARE_PROVIDER_SITE_OTHER): Payer: BC Managed Care – PPO

## 2020-07-25 DIAGNOSIS — I255 Ischemic cardiomyopathy: Secondary | ICD-10-CM

## 2020-07-25 LAB — CUP PACEART REMOTE DEVICE CHECK
Battery Remaining Longevity: 4 mo
Battery Remaining Percentage: 4 %
Battery Voltage: 2.62 V
Brady Statistic AP VP Percent: 94 %
Brady Statistic AP VS Percent: 1.9 %
Brady Statistic AS VP Percent: 2.7 %
Brady Statistic AS VS Percent: 1 %
Brady Statistic RA Percent Paced: 95 %
Date Time Interrogation Session: 20220127020016
HighPow Impedance: 43 Ohm
HighPow Impedance: 44 Ohm
Implantable Lead Implant Date: 20140117
Implantable Lead Implant Date: 20140117
Implantable Lead Implant Date: 20140117
Implantable Lead Location: 753858
Implantable Lead Location: 753859
Implantable Lead Location: 753860
Implantable Pulse Generator Implant Date: 20140117
Lead Channel Impedance Value: 440 Ohm
Lead Channel Impedance Value: 440 Ohm
Lead Channel Impedance Value: 890 Ohm
Lead Channel Pacing Threshold Amplitude: 0.75 V
Lead Channel Pacing Threshold Amplitude: 0.875 V
Lead Channel Pacing Threshold Amplitude: 1.375 V
Lead Channel Pacing Threshold Pulse Width: 0.5 ms
Lead Channel Pacing Threshold Pulse Width: 0.5 ms
Lead Channel Pacing Threshold Pulse Width: 0.5 ms
Lead Channel Sensing Intrinsic Amplitude: 11.6 mV
Lead Channel Sensing Intrinsic Amplitude: 5 mV
Lead Channel Setting Pacing Amplitude: 1.75 V
Lead Channel Setting Pacing Amplitude: 2 V
Lead Channel Setting Pacing Amplitude: 2.375
Lead Channel Setting Pacing Pulse Width: 0.5 ms
Lead Channel Setting Pacing Pulse Width: 0.5 ms
Lead Channel Setting Sensing Sensitivity: 0.5 mV
Pulse Gen Serial Number: 7070427

## 2020-07-26 ENCOUNTER — Telehealth: Payer: Self-pay

## 2020-07-26 NOTE — Telephone Encounter (Signed)
Called patient to advise of increase in monthly battery checks. No answer.  Unable to leave message on VM d/t continuously ringing.

## 2020-07-29 ENCOUNTER — Ambulatory Visit (INDEPENDENT_AMBULATORY_CARE_PROVIDER_SITE_OTHER): Payer: Medicare Other | Admitting: Physician Assistant

## 2020-07-29 ENCOUNTER — Other Ambulatory Visit: Payer: Self-pay

## 2020-07-29 VITALS — BP 136/62 | HR 61 | Ht 70.0 in | Wt 239.0 lb

## 2020-07-29 DIAGNOSIS — I10 Essential (primary) hypertension: Secondary | ICD-10-CM

## 2020-07-29 DIAGNOSIS — E1122 Type 2 diabetes mellitus with diabetic chronic kidney disease: Secondary | ICD-10-CM

## 2020-07-29 DIAGNOSIS — I5022 Chronic systolic (congestive) heart failure: Secondary | ICD-10-CM | POA: Diagnosis not present

## 2020-07-29 DIAGNOSIS — I480 Paroxysmal atrial fibrillation: Secondary | ICD-10-CM | POA: Diagnosis not present

## 2020-07-29 DIAGNOSIS — I5023 Acute on chronic systolic (congestive) heart failure: Secondary | ICD-10-CM

## 2020-07-29 DIAGNOSIS — I420 Dilated cardiomyopathy: Secondary | ICD-10-CM | POA: Diagnosis not present

## 2020-07-29 LAB — POCT GLYCOSYLATED HEMOGLOBIN (HGB A1C): Hemoglobin A1C: 6.1 % — AB (ref 4.0–5.6)

## 2020-07-29 MED ORDER — CARVEDILOL 25 MG PO TABS: ORAL_TABLET | ORAL | 1 refills | Status: DC

## 2020-07-29 MED ORDER — TRULICITY 1.5 MG/0.5ML ~~LOC~~ SOAJ
SUBCUTANEOUS | 0 refills | Status: DC
Start: 2020-07-29 — End: 2020-08-28

## 2020-07-29 NOTE — Telephone Encounter (Signed)
Attempted to call patient on home phone with no answer or option to leave message. Attempted to call patient on mobile phone with no answer and no option to leave voicemail.

## 2020-07-29 NOTE — Progress Notes (Signed)
Subjective:    Patient ID: Earl Hatcher Sr., male    DOB: 26-Aug-1940, 80 y.o.   MRN: [id number]  HPI  Patient is a 80 year old obese male with chronic systolic heart failure, cardiomyopathy, hypertension, paroxysmal atrial fibrillation, type 2 diabetes, hyperlipidemia who presents to the clinic for his 84-month follow-up.  Overall patient is doing very well.  He is compliant with his medications.  He denies any significant chest pain, dizziness, syncope.  He does continue to have shortness of breath with walking up stairs and certain physical tasks.  He sees cardiology every 6 months. Patient denies any lower extremity swelling.  Patient does not check his sugars regularly but is taking Synjardy and Trulicity.  He questions the price of Trulicity and would like his medication sent to a mail order pharmacy.  He is not aware of the mail order pharmacy which his insurance is partnered with.  Denies any open sores or wounds.  He denies any hypoglycemic events.  .. Active Ambulatory Problems    Diagnosis Date Noted  . Essential hypertension, benign 04/28/2013  . BPH (benign prostatic hyperplasia) 04/28/2013  . Gout 04/28/2013  . Congestive dilated cardiomyopathy ([place]) 05/10/2013  . ICD (implantable cardioverter-defibrillator) in place 05/10/2013  . Elevated PSA 11/24/2013  . Systolic dysfunction with acute on chronic heart failure ([place]) 09/03/2014  . Morbid obesity ([place]) 09/03/2014  . Erectile dysfunction 03/10/2015  . Lower extremity edema 09/29/2017  . Abnormal ultrasound of lower extremity 09/29/2017  . Malignant neoplasm of prostate ([place]) 02/03/2018  . Family history of cancer 02/03/2018  . Family history of prostate cancer   . Family history of breast cancer   . Genetic testing 03/25/2018  . Hypotension 05/23/2018  . Paroxysmal atrial fibrillation ([place]) 12/25/2015  . History of gout 08/01/2018  . Type 2 diabetes mellitus with chronic kidney disease, without long-term current use  of insulin ([place]) 08/01/2018  . Acute left-sided low back pain without sciatica 05/16/2019  . Syncope and collapse 05/24/2019  . Chronic systolic heart failure ([place]) 06/08/2019  . 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 Fall 08/16/2019  . Impetigo 08/18/2019  . Rectal bleeding 12/11/2019  . Hyperlipidemia LDL goal <70 12/11/2019   Resolved Ambulatory Problems    Diagnosis Date Noted  . Diabetes mellitus due to underlying condition, controlled, without complication, 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[place]) 04/28/2013   Past Medical History:  Diagnosis Date  . CHF (congestive heart failure) ([place])   . Diabetes mellitus ([place])   . Hyperlipidemia   . Hypertension   . Nonischemic cardiomyopathy ([place])   . Prostate cancer [facility])        Review of Systems  All other systems reviewed and are negative.      Objective:   Physical Exam Vitals reviewed.  Constitutional:      Appearance: Normal appearance. He is obese.  HENT:     Head: Normocephalic.  Neck:     Vascular: No carotid bruit.  Cardiovascular:     Rate and Rhythm: Normal rate and regular rhythm.     Pulses: Normal pulses.  Pulmonary:     Effort: Pulmonary effort is normal.     Breath sounds: Normal breath sounds.  Musculoskeletal:     Right lower leg: No edema.     Left lower leg: No edema.  Neurological:     General: No focal deficit present.     Mental Status: He is alert and oriented to person, place, and time.  Psychiatric:  Mood and Affect: Mood normal.       .. Lab Results  Component Value Date   HGBA1C 6.1 (A) 07/29/2020       Assessment & Plan:  [name] KitchenMarland KitchenTerrence was seen today for diabetes.  Diagnoses and all orders for this visit:  Type 2 diabetes mellitus with chronic kidney disease, without long-term current use of insulin, unspecified CKD stage (HCC) -     POCT glycosylated hemoglobin (Hb A1C) -     COMPLETE METABOLIC PANEL WITH GFR -     Dulaglutide (TRULICITY) 1.5 EH/2.0NO  SOPN; INJECT 1.5 MG SUBCUTANEOUSLY ONCE A WEEK .  Chronic systolic heart failure (HCC) -     COMPLETE METABOLIC PANEL WITH GFR  Essential hypertension, benign -     carvedilol (COREG) 25 MG tablet; TAKE 1 TABLET BY MOUTH TWICE DAILY WITH MEALS  Paroxysmal atrial fibrillation (HCC)  Congestive dilated cardiomyopathy (HCC) -     carvedilol (COREG) 25 MG tablet; TAKE 1 TABLET BY MOUTH TWICE DAILY WITH MEALS  Systolic dysfunction with acute on chronic heart failure (HCC) -     carvedilol (COREG) 25 MG tablet; TAKE 1 TABLET BY MOUTH TWICE DAILY WITH MEALS   His A1c is 6.1 which is well controlled. Continue on same medications.  Patient was instructed to find out which mail order pharmacy his insurance is paired with and I will send it to that pharmacy. On ACE.  Blood pressure to goal. On statin. Foot exam UTD.  Eye exam UTD.  Covid/flu/pneumonia UTD.  Follow up in 3 months.   Continue to follow-up with cardiology for his maintenance of ICD defibrillator and chronic systolic heart failure.

## 2020-07-30 ENCOUNTER — Encounter: Payer: Self-pay | Admitting: Physician Assistant

## 2020-07-30 MED ORDER — SPIRONOLACTONE 25 MG PO TABS: ORAL_TABLET | ORAL | 0 refills | Status: DC

## 2020-07-30 NOTE — Telephone Encounter (Signed)
Attempted to contact patient about scheduled monthly battery checks. No answer on home phone. Unable to contact patient on mobile phone and no option to leave messages or voicemail at either number listed.

## 2020-08-05 ENCOUNTER — Other Ambulatory Visit: Payer: Self-pay

## 2020-08-05 ENCOUNTER — Ambulatory Visit (HOSPITAL_COMMUNITY): Payer: Medicare Other | Attending: Cardiovascular Disease

## 2020-08-05 DIAGNOSIS — I472 Ventricular tachycardia, unspecified: Secondary | ICD-10-CM

## 2020-08-05 DIAGNOSIS — I5022 Chronic systolic (congestive) heart failure: Secondary | ICD-10-CM | POA: Insufficient documentation

## 2020-08-05 LAB — ECHOCARDIOGRAM COMPLETE
AR max vel: 1.44 cm2
AV Area VTI: 1.5 cm2
AV Area mean vel: 1.53 cm2
AV Mean grad: 12 mmHg
AV Peak grad: 23 mmHg
Ao pk vel: 2.4 m/s
Area-P 1/2: 4.17 cm2
S' Lateral: 5.8 cm

## 2020-08-05 NOTE — Telephone Encounter (Signed)
Attempted to call patient with no answer. Phone continuously rang. Unable to leave a message.

## 2020-08-05 NOTE — Addendum Note (Signed)
Addended by: Cheri Kearns A on: 08/05/2020 09:19 AM   Modules accepted: Level of Service

## 2020-08-05 NOTE — Progress Notes (Signed)
Remote ICD transmission.   

## 2020-08-06 ENCOUNTER — Other Ambulatory Visit: Payer: Self-pay | Admitting: Internal Medicine

## 2020-08-06 ENCOUNTER — Ambulatory Visit (INDEPENDENT_AMBULATORY_CARE_PROVIDER_SITE_OTHER): Payer: Medicare Other

## 2020-08-06 DIAGNOSIS — Z95810 Presence of automatic (implantable) cardiac defibrillator: Secondary | ICD-10-CM

## 2020-08-06 DIAGNOSIS — I5022 Chronic systolic (congestive) heart failure: Secondary | ICD-10-CM

## 2020-08-06 NOTE — Telephone Encounter (Signed)
Prescription refill request for Eliquis received.  Indication: Afib Last office visit: Allred, 06/20/2020 Scr: 1.22, 12/13/2019 Age: 80 yo  Weight: 108.4 kg   Prescription refill sent.

## 2020-08-06 NOTE — Telephone Encounter (Signed)
Certified letter sent 08/06/2020

## 2020-08-12 NOTE — Progress Notes (Signed)
EPIC Encounter for ICM Monitoring  Patient Name: Earl Nakamura Sr. is a 80 y.o. male Date: 08/12/2020 Primary Care Physican: Lavada Mesi Primary Cardiologist:Crenshaw Electrophysiologist: Allred Bi-V Pacing: 98% 12/32/2021 OfficeWeight:240lbs  AT/AF Burden<1%  Battery ERI: Approximately2.27months    Transmission reviewed.  CorVue thoracic impedance normal.  Prescribed:  Furosemide 40 mg1tablet (40 mg total) daily.  Spironolactone 25 mg take 0.5 tablet (12.5 mg total) daily  Eliquis 5 mg take 1 tablet twice a day.  Labs: 12/11/2019 Creatinine1.22, BUN23, Potassium4.5, [id number], [id number] A complete set of results can be found in Results Review.  Recommendations: No changes.  Follow-up plan: ICM clinic phone appointment on3/14/2022.91 day device clinic remote transmission5/07/2020.  EP/Cardiology Office Visits:12/23/2020 with Dr Rayann Heman  Copy of ICM check sent to Dr.Allred.   3 month ICM trend: 08/09/2020.    1 Year ICM trend:       Rosalene Billings, RN 08/12/2020 4:48 PM

## 2020-08-19 NOTE — Telephone Encounter (Signed)
I spoke with the patient and let him know he has 2.9 months on his battery. The nurse was giving him a call to let him know we wanted to do monthly battery checks. I let him know that his next remote battery check is on 08-26-2020 and that is with his home  Bedside monitor.

## 2020-08-26 ENCOUNTER — Ambulatory Visit (INDEPENDENT_AMBULATORY_CARE_PROVIDER_SITE_OTHER): Payer: BC Managed Care – PPO

## 2020-08-26 DIAGNOSIS — I255 Ischemic cardiomyopathy: Secondary | ICD-10-CM

## 2020-08-28 ENCOUNTER — Other Ambulatory Visit: Payer: Self-pay | Admitting: Physician Assistant

## 2020-08-28 DIAGNOSIS — E1122 Type 2 diabetes mellitus with diabetic chronic kidney disease: Secondary | ICD-10-CM

## 2020-08-28 LAB — CUP PACEART REMOTE DEVICE CHECK
Battery Remaining Longevity: 3 mo
Battery Remaining Percentage: 3 %
Battery Voltage: 2.6 V
Brady Statistic AP VP Percent: 96 %
Brady Statistic AP VS Percent: 1.2 %
Brady Statistic AS VP Percent: 2.3 %
Brady Statistic AS VS Percent: 1 %
Brady Statistic RA Percent Paced: 96 %
Date Time Interrogation Session: 20220228020015
HighPow Impedance: 43 Ohm
HighPow Impedance: 43 Ohm
Implantable Lead Implant Date: 20140117
Implantable Lead Implant Date: 20140117
Implantable Lead Implant Date: 20140117
Implantable Lead Location: 753858
Implantable Lead Location: 753859
Implantable Lead Location: 753860
Implantable Pulse Generator Implant Date: 20140117
Lead Channel Impedance Value: 450 Ohm
Lead Channel Impedance Value: 490 Ohm
Lead Channel Impedance Value: 900 Ohm
Lead Channel Pacing Threshold Amplitude: 0.75 V
Lead Channel Pacing Threshold Amplitude: 0.875 V
Lead Channel Pacing Threshold Amplitude: 1.375 V
Lead Channel Pacing Threshold Pulse Width: 0.5 ms
Lead Channel Pacing Threshold Pulse Width: 0.5 ms
Lead Channel Pacing Threshold Pulse Width: 0.5 ms
Lead Channel Sensing Intrinsic Amplitude: 11.6 mV
Lead Channel Sensing Intrinsic Amplitude: 5 mV
Lead Channel Setting Pacing Amplitude: 1.75 V
Lead Channel Setting Pacing Amplitude: 2 V
Lead Channel Setting Pacing Amplitude: 2.375
Lead Channel Setting Pacing Pulse Width: 0.5 ms
Lead Channel Setting Pacing Pulse Width: 0.5 ms
Lead Channel Setting Sensing Sensitivity: 0.5 mV
Pulse Gen Serial Number: 7070427

## 2020-09-03 NOTE — Addendum Note (Signed)
Addended by: Cheri Kearns A on: 09/03/2020 03:43 PM   Modules accepted: Level of Service

## 2020-09-03 NOTE — Progress Notes (Signed)
Remote ICD transmission.   

## 2020-09-09 ENCOUNTER — Ambulatory Visit (INDEPENDENT_AMBULATORY_CARE_PROVIDER_SITE_OTHER): Payer: Medicare Other

## 2020-09-09 DIAGNOSIS — I5022 Chronic systolic (congestive) heart failure: Secondary | ICD-10-CM | POA: Diagnosis not present

## 2020-09-09 DIAGNOSIS — Z95810 Presence of automatic (implantable) cardiac defibrillator: Secondary | ICD-10-CM | POA: Diagnosis not present

## 2020-09-10 ENCOUNTER — Telehealth: Payer: Self-pay

## 2020-09-10 NOTE — Telephone Encounter (Signed)
Remote ICM transmission received.  Attempted call to patient regarding ICM remote transmission and no answer or voice mail option.  

## 2020-09-10 NOTE — Progress Notes (Signed)
EPIC Encounter for ICM Monitoring  Patient Name: Earl Cutler Sr. is a 80 y.o. male Date: 09/10/2020 Primary Care Physican: Lavada Mesi Primary Cardiologist:Crenshaw Electrophysiologist: Allred Bi-V Pacing: 98% 07/29/2020 OfficeWeight:239lbs  AT/AF Burden<1%  Battery ERI: Approximately2.59months     Attempted call to patient and unable to reach.   Transmission reviewed.   CorVue thoracic impedance normal.  Prescribed:  Furosemide 40 mg1tablet (40 mg total) daily.  Spironolactone 25 mg take 0.5 tablet (12.5 mg total) daily  Eliquis 5 mg take 1 tablet twice a day.  Labs: 12/11/2019 Creatinine1.22, BUN23, Potassium4.5, [id number], [id number] A complete set of results can be found in Results Review.  Recommendations: Unable to reach.    Follow-up plan: ICM clinic phone appointment on4/18/2022.91 day device clinic remote transmission5/07/2020.  EP/Cardiology Office Visits:12/23/2020 with Dr Rayann Heman  Copy of ICM check sent to Dr.Allred.   3 month ICM trend: 09/09/2020.    1 Year ICM trend:       Rosalene Billings, RN 09/10/2020 9:51 AM

## 2020-09-16 ENCOUNTER — Telehealth: Payer: Self-pay

## 2020-09-16 DIAGNOSIS — I255 Ischemic cardiomyopathy: Secondary | ICD-10-CM

## 2020-09-16 DIAGNOSIS — Z95810 Presence of automatic (implantable) cardiac defibrillator: Secondary | ICD-10-CM

## 2020-09-16 DIAGNOSIS — I5022 Chronic systolic (congestive) heart failure: Secondary | ICD-10-CM

## 2020-09-16 NOTE — Telephone Encounter (Signed)
Carelink alert received for device ERI reached on 09/12/20. Unsuccessful telephone encounter to patient to discuss. Unfortunately there is no answer or VM set-up on either given numbers. Noted device change out was discussed with patient at last EP OV 06/20/20 per EMR documentation. Patient not dependent at that time with underlying rhythm SB 50. Will continue telephone outreach attempts.

## 2020-09-16 NOTE — Telephone Encounter (Signed)
Successful telephone encounter to Earl White to follow up on [facility] alert for device ERI reached 09/12/20. Patient denies vibratory alerts. States he is doing well from a device standpoint. Acknowledges he is aware that his devices was nearing ERI and had discussed generator change out with Dr. Rayann Heman during last appointment 06/20/20. Patient is eager to schedule appointment for change out. He is informed that he would be contacted with appointment time and date. Patient has no additional concerns or questions at this time and appreciative of call. Will route note to Dr. Jackalyn Lombard nurse for scheduling.

## 2020-09-20 ENCOUNTER — Other Ambulatory Visit (HOSPITAL_COMMUNITY): Payer: BC Managed Care – PPO

## 2020-09-20 ENCOUNTER — Other Ambulatory Visit: Payer: Self-pay | Admitting: Physician Assistant

## 2020-09-20 DIAGNOSIS — I5023 Acute on chronic systolic (congestive) heart failure: Secondary | ICD-10-CM

## 2020-09-20 DIAGNOSIS — I420 Dilated cardiomyopathy: Secondary | ICD-10-CM

## 2020-09-20 NOTE — Telephone Encounter (Signed)
Set up procedure, labs and covid screening. To meet with patient on day of lab work to give instruction and scrub.  Patient verbalized understanding.

## 2020-09-20 NOTE — Addendum Note (Signed)
Addended by: Darrell Jewel on: 09/20/2020 12:28 PM   Modules accepted: Orders

## 2020-09-23 ENCOUNTER — Encounter: Payer: Self-pay | Admitting: *Deleted

## 2020-09-24 ENCOUNTER — Other Ambulatory Visit: Payer: Self-pay | Admitting: Physician Assistant

## 2020-09-24 DIAGNOSIS — E1122 Type 2 diabetes mellitus with diabetic chronic kidney disease: Secondary | ICD-10-CM

## 2020-09-26 ENCOUNTER — Ambulatory Visit (INDEPENDENT_AMBULATORY_CARE_PROVIDER_SITE_OTHER): Payer: BC Managed Care – PPO

## 2020-09-26 DIAGNOSIS — I420 Dilated cardiomyopathy: Secondary | ICD-10-CM

## 2020-09-26 LAB — CUP PACEART REMOTE DEVICE CHECK
Battery Remaining Longevity: 0 mo
Battery Voltage: 2.59 V
Brady Statistic AP VP Percent: 95 %
Brady Statistic AP VS Percent: 1.1 %
Brady Statistic AS VP Percent: 3.2 %
Brady Statistic AS VS Percent: 1 %
Brady Statistic RA Percent Paced: 95 %
Date Time Interrogation Session: 20220331020033
HighPow Impedance: 44 Ohm
HighPow Impedance: 44 Ohm
Implantable Lead Implant Date: 20140117
Implantable Lead Implant Date: 20140117
Implantable Lead Implant Date: 20140117
Implantable Lead Location: 753858
Implantable Lead Location: 753859
Implantable Lead Location: 753860
Implantable Pulse Generator Implant Date: 20140117
Lead Channel Impedance Value: 440 Ohm
Lead Channel Impedance Value: 440 Ohm
Lead Channel Impedance Value: 850 Ohm
Lead Channel Pacing Threshold Amplitude: 0.75 V
Lead Channel Pacing Threshold Amplitude: 0.875 V
Lead Channel Pacing Threshold Amplitude: 1.25 V
Lead Channel Pacing Threshold Pulse Width: 0.5 ms
Lead Channel Pacing Threshold Pulse Width: 0.5 ms
Lead Channel Pacing Threshold Pulse Width: 0.5 ms
Lead Channel Sensing Intrinsic Amplitude: 11.6 mV
Lead Channel Sensing Intrinsic Amplitude: 5 mV
Lead Channel Setting Pacing Amplitude: 1.75 V
Lead Channel Setting Pacing Amplitude: 2 V
Lead Channel Setting Pacing Amplitude: 2.25 V
Lead Channel Setting Pacing Pulse Width: 0.5 ms
Lead Channel Setting Pacing Pulse Width: 0.5 ms
Lead Channel Setting Sensing Sensitivity: 0.5 mV
Pulse Gen Serial Number: 7070427

## 2020-10-02 ENCOUNTER — Other Ambulatory Visit: Payer: Self-pay | Admitting: Physician Assistant

## 2020-10-02 DIAGNOSIS — E1122 Type 2 diabetes mellitus with diabetic chronic kidney disease: Secondary | ICD-10-CM

## 2020-10-09 NOTE — Progress Notes (Signed)
Remote ICD transmission.   

## 2020-10-11 ENCOUNTER — Other Ambulatory Visit: Payer: Self-pay

## 2020-10-11 ENCOUNTER — Other Ambulatory Visit: Payer: Medicare Other | Admitting: *Deleted

## 2020-10-11 DIAGNOSIS — Z95810 Presence of automatic (implantable) cardiac defibrillator: Secondary | ICD-10-CM | POA: Diagnosis not present

## 2020-10-11 DIAGNOSIS — I255 Ischemic cardiomyopathy: Secondary | ICD-10-CM

## 2020-10-11 DIAGNOSIS — I5022 Chronic systolic (congestive) heart failure: Secondary | ICD-10-CM

## 2020-10-12 LAB — BASIC METABOLIC PANEL
BUN/Creatinine Ratio: 14 (ref 10–24)
BUN: 18 mg/dL (ref 8–27)
CO2: 23 mmol/L (ref 20–29)
Calcium: 9.9 mg/dL (ref 8.6–10.2)
Chloride: 106 mmol/L (ref 96–106)
Creatinine, Ser: 1.32 mg/dL — ABNORMAL HIGH (ref 0.76–1.27)
Glucose: 96 mg/dL (ref 65–99)
Potassium: 5 mmol/L (ref 3.5–5.2)
Sodium: 144 mmol/L (ref 134–144)
eGFR: 55 mL/min/{1.73_m2} — ABNORMAL LOW (ref >59)

## 2020-10-12 LAB — CBC WITH DIFFERENTIAL/PLATELET
Basophils Absolute: 0.1 10*3/uL (ref 0.0–0.2)
Basos: 1 %
EOS (ABSOLUTE): 0.2 10*3/uL (ref 0.0–0.4)
Eos: 3 %
Hematocrit: 48 % (ref 37.5–51.0)
Hemoglobin: 15.3 g/dL (ref 13.0–17.7)
Immature Grans (Abs): 0 10*3/uL (ref 0.0–0.1)
Immature Granulocytes: 0 %
Lymphocytes Absolute: 1.9 10*3/uL (ref 0.7–3.1)
Lymphs: 29 %
MCH: 28.5 pg (ref 26.6–33.0)
MCHC: 31.9 g/dL (ref 31.5–35.7)
MCV: 90 fL (ref 79–97)
Monocytes Absolute: 0.6 10*3/uL (ref 0.1–0.9)
Monocytes: 10 %
Neutrophils Absolute: 3.6 10*3/uL (ref 1.4–7.0)
Neutrophils: 57 %
Platelets: 208 10*3/uL (ref 150–450)
RBC: 5.36 x10E6/uL (ref 4.14–5.80)
RDW: 15.3 % (ref 11.6–15.4)
WBC: 6.4 10*3/uL (ref 3.4–10.8)

## 2020-10-14 ENCOUNTER — Ambulatory Visit (INDEPENDENT_AMBULATORY_CARE_PROVIDER_SITE_OTHER): Payer: Medicare Other

## 2020-10-14 DIAGNOSIS — Z95810 Presence of automatic (implantable) cardiac defibrillator: Secondary | ICD-10-CM

## 2020-10-14 DIAGNOSIS — I5022 Chronic systolic (congestive) heart failure: Secondary | ICD-10-CM

## 2020-10-18 ENCOUNTER — Telehealth: Payer: Self-pay

## 2020-10-18 NOTE — Telephone Encounter (Signed)
Remote ICM transmission received.  Attempted call to patient regarding ICM remote transmission and no answer or voice mail message.   

## 2020-10-18 NOTE — Progress Notes (Signed)
EPIC Encounter for ICM Monitoring  Patient Name: Earl Amer Sr. is a 80 y.o. male Date: 10/18/2020 Primary Care Physican: Lavada Mesi Primary Cardiologist:Crenshaw Electrophysiologist: Allred Bi-V Pacing: 98% 07/29/2020 OfficeWeight:239lbs  AT/AF Burden<1%     Attempted call to patient and unable to reach.  Transmission reviewed. .  Device battery replacement scheduled for 10/22/2020.  CorVue thoracic impedance normal.    Prescribed:  Furosemide 40 mg1tablet (40 mg total) daily.  Spironolactone 25 mg take 0.5 tablet (12.5 mg total) daily  Eliquis 5 mg take 1 tablet twice a day.  Labs: 10/11/2020 Creatinine 1.32, BUN 18, Potassium 5.0, Sodium 144, GFR 55 A complete set of results can be found in Results Review.  Recommendations: Unable to reach.    Follow-up plan: ICM clinic phone appointment on 12/09/2020.91 day device clinic remote transmission5/07/2020.  EP/Cardiology Office Visits:12/23/2020 with Dr Rayann Heman  Copy of ICM check sent to Dr.Allred.   3 month ICM trend: 10/14/2020.    1 Year ICM trend:       Rosalene Billings, RN 10/18/2020 9:12 AM

## 2020-10-21 ENCOUNTER — Other Ambulatory Visit (HOSPITAL_COMMUNITY)
Admission: RE | Admit: 2020-10-21 | Discharge: 2020-10-21 | Disposition: A | Discharge status: Home / Self Care | Payer: Medicare Other | Source: Ambulatory Visit | Attending: Internal Medicine | Admitting: Internal Medicine

## 2020-10-21 DIAGNOSIS — Z01812 Encounter for preprocedural laboratory examination: Secondary | ICD-10-CM | POA: Insufficient documentation

## 2020-10-21 DIAGNOSIS — Z20822 Contact with and (suspected) exposure to covid-19: Secondary | ICD-10-CM | POA: Diagnosis not present

## 2020-10-21 NOTE — Pre-Procedure Instructions (Signed)
Instructed patient on the following items: Arrival time 1130 Nothing to eat or drink after midnight No meds AM of procedure Responsible person to drive you home and stay with you for 24 hrs Wash with special soap night before and morning of procedure If on anti-coagulant drug instructions Eliquis- last dose 4/25 AM

## 2020-10-22 ENCOUNTER — Other Ambulatory Visit: Payer: Self-pay

## 2020-10-22 ENCOUNTER — Ambulatory Visit (HOSPITAL_COMMUNITY)
Admission: RE | Admit: 2020-10-22 | Discharge: 2020-10-22 | Disposition: A | Discharge status: Home / Self Care | Payer: Medicare Other | Attending: Internal Medicine | Admitting: Internal Medicine

## 2020-10-22 ENCOUNTER — Encounter (HOSPITAL_COMMUNITY)
Admission: RE | Disposition: A | Discharge status: Home / Self Care | Payer: BC Managed Care – PPO | Source: Home / Self Care | Attending: Internal Medicine

## 2020-10-22 DIAGNOSIS — I509 Heart failure, unspecified: Secondary | ICD-10-CM | POA: Insufficient documentation

## 2020-10-22 DIAGNOSIS — I472 Ventricular tachycardia: Secondary | ICD-10-CM | POA: Insufficient documentation

## 2020-10-22 DIAGNOSIS — Z79899 Other long term (current) drug therapy: Secondary | ICD-10-CM | POA: Insufficient documentation

## 2020-10-22 DIAGNOSIS — I480 Paroxysmal atrial fibrillation: Secondary | ICD-10-CM | POA: Diagnosis not present

## 2020-10-22 DIAGNOSIS — Z7901 Long term (current) use of anticoagulants: Secondary | ICD-10-CM | POA: Diagnosis not present

## 2020-10-22 DIAGNOSIS — I4892 Unspecified atrial flutter: Secondary | ICD-10-CM | POA: Insufficient documentation

## 2020-10-22 DIAGNOSIS — I255 Ischemic cardiomyopathy: Secondary | ICD-10-CM | POA: Diagnosis not present

## 2020-10-22 DIAGNOSIS — I428 Other cardiomyopathies: Secondary | ICD-10-CM | POA: Diagnosis not present

## 2020-10-22 DIAGNOSIS — Z4502 Encounter for adjustment and management of automatic implantable cardiac defibrillator: Secondary | ICD-10-CM | POA: Insufficient documentation

## 2020-10-22 HISTORY — PX: BIV ICD GENERATOR CHANGEOUT: EP1194

## 2020-10-22 LAB — GLUCOSE, CAPILLARY: Glucose-Capillary: 92 mg/dL (ref 70–99)

## 2020-10-22 LAB — SARS CORONAVIRUS 2 (TAT 6-24 HRS): SARS Coronavirus 2: NEGATIVE

## 2020-10-22 SURGERY — BIV ICD GENERATOR CHANGEOUT

## 2020-10-22 MED ORDER — ONDANSETRON HCL 4 MG/2ML IJ SOLN: 4.0000 mg | Freq: Four times a day (QID) | INTRAMUSCULAR | Status: DC | PRN

## 2020-10-22 MED ORDER — LIDOCAINE HCL (PF) 1 % IJ SOLN
INTRAMUSCULAR | Status: AC
  Filled 2020-10-22: qty 60

## 2020-10-22 MED ORDER — SODIUM CHLORIDE 0.9% FLUSH: 3.0000 mL | Freq: Two times a day (BID) | INTRAVENOUS | Status: DC

## 2020-10-22 MED ORDER — LIDOCAINE HCL (PF) 1 % IJ SOLN
INTRAMUSCULAR | Status: DC | PRN
  Administered 2020-10-22: 60 mL

## 2020-10-22 MED ORDER — SODIUM CHLORIDE 0.9 % IV SOLN
80.0000 mg | INTRAVENOUS | Status: AC
  Administered 2020-10-22: 80 mg

## 2020-10-22 MED ORDER — SODIUM CHLORIDE 0.9 % IV SOLN: INTRAVENOUS | Status: DC

## 2020-10-22 MED ORDER — SODIUM CHLORIDE 0.9% FLUSH: 3.0000 mL | INTRAVENOUS | Status: DC | PRN

## 2020-10-22 MED ORDER — CEFAZOLIN SODIUM-DEXTROSE 2-4 GM/100ML-% IV SOLN
2.0000 g | INTRAVENOUS | Status: AC
  Administered 2020-10-22: 2 g via INTRAVENOUS

## 2020-10-22 MED ORDER — ACETAMINOPHEN 325 MG PO TABS
325.0000 mg | ORAL_TABLET | ORAL | Status: DC | PRN
Start: 2020-10-22 — End: 2020-10-22
  Filled 2020-10-22: qty 2

## 2020-10-22 MED ORDER — POVIDONE-IODINE 10 % EX SWAB
2.0000 "application " | Freq: Once | CUTANEOUS | Status: AC
  Administered 2020-10-22: 2 via TOPICAL

## 2020-10-22 MED ORDER — SODIUM CHLORIDE 0.9 % IV SOLN: 250.0000 mL | INTRAVENOUS | Status: DC | PRN

## 2020-10-22 MED ORDER — CHLORHEXIDINE GLUCONATE 4 % EX LIQD
4.0000 "application " | Freq: Once | CUTANEOUS | Status: DC
  Filled 2020-10-22: qty 60

## 2020-10-22 MED ORDER — SODIUM CHLORIDE 0.9 % IV SOLN
INTRAVENOUS | Status: AC
  Filled 2020-10-22: qty 2

## 2020-10-22 MED ORDER — CEFAZOLIN SODIUM-DEXTROSE 2-4 GM/100ML-% IV SOLN
INTRAVENOUS | Status: AC
  Filled 2020-10-22: qty 100

## 2020-10-22 SURGICAL SUPPLY — 5 items
CABLE SURGICAL S-101-97-12 (CABLE) ×2 IMPLANT
ICD GALLANT HFCRTD CDHFA500Q (ICD Generator) ×2 IMPLANT
PAD PRO RADIOLUCENT 2001M-C (PAD) ×2 IMPLANT
POUCH AIGIS-R ANTIBACT ICD (Mesh General) ×2 IMPLANT
TRAY PACEMAKER INSERTION (PACKS) ×2 IMPLANT

## 2020-10-22 NOTE — H&P (Signed)
PCP: Donella Stade, PA-C Primary Cardiologist: Dr [name] Primary EP: Dr Annamarie Major Sr. is a 80 y.o. male who presents today for BiV ICD generator change.  Since last being seen in our clinic, the patient reports doing very well.  Today, he denies symptoms of palpitations, chest pain, shortness of breath,  lower extremity edema, or dizziness,.  The patient is otherwise without complaint today.       Past Medical History:  Diagnosis Date  . BPH (benign prostatic hyperplasia)   . CHF (congestive heart failure) ([place])   . Diabetes mellitus ([place])    Type II. Diet controlled  . Family history of breast cancer   . Family history of prostate cancer   . Gout   . Hyperlipidemia   . Hypertension   . Nonischemic cardiomyopathy ([place])    a. s/p STJ CRTD  . Paroxysmal atrial fibrillation ([place]) 12/25/15  . Prostate cancer [facility])         Past Surgical History:  Procedure Laterality Date  . CARDIAC DEFIBRILLATOR PLACEMENT  Jan 2014   SJM Quadra Assura implanted by Dr [name] in [place] are reviewed and negative except as per HPI above        Current Outpatient Medications  Medication Sig Dispense Refill  . AMBULATORY NON FORMULARY MEDICATION shingrx 2 doses to prevent shingles. 2 application 0  . atorvastatin (LIPITOR) 80 MG tablet Take 1 tablet by mouth once daily 90 tablet 3  . carvedilol (COREG) 25 MG tablet TAKE 1 TABLET BY MOUTH TWICE DAILY WITH MEALS 60 tablet 1  . Dulaglutide (TRULICITY) 1.5 IO/9.7DZ SOPN INJECT 1.5 MG SUBCUTANEOUSLY ONCE A WEEK . 6 mL 0  . ELIQUIS 5 MG TABS tablet Take 1 tablet by mouth twice daily 180 tablet 1  . Empagliflozin-metFORMIN HCl ER (SYNJARDY XR) 03-999 MG TB24 Take 1 tablet by mouth daily. 90 tablet 0  . ENTRESTO 97-103 MG Take 1 tablet by mouth twice daily 180 tablet 3  . furosemide (LASIX) 40 MG tablet Take 1 tablet by mouth once daily 90 tablet 0  . sennosides-docusate sodium (SENOKOT-S)  8.6-50 MG tablet Take 1 tablet by mouth daily as needed. For constipation    . sildenafil (VIAGRA) 100 MG tablet TAKE 1 TABLET BY MOUTH AS NEEDED FOR  ERECTILE  DYSFUNCTION 10 tablet 2  . spironolactone (ALDACTONE) 25 MG tablet Take 0.5 tablets (12.5 mg total) by mouth once for 1 dose. Needs appt 45 tablet 0   No current facility-administered medications for this visit.    Physical Exam: Vitals:   10/22/20 1146  BP: (!) 149/81  Pulse: 60  Temp: 98.2 F (36.8 C)  SpO2: 93%   GEN- The patient is well appearing, alert and oriented x 3 today.   Head- normocephalic, atraumatic Eyes-  Sclera clear, conjunctiva pink Ears- hearing intact Oropharynx- clear Lungs-   normal work of breathing Chest- ICD pocket is well healed Heart- Regular rate and rhythm  GI- soft,  Extremities- no clubbing, cyanosis, or edema   Assessment and Plan:  1.  Chronic systolic dysfunction/ ischemic CM/ VT euvolemic today Device is at ERI.  ICD Criteria  Current LVEF:25%. Within 12 months prior to implant: Yes   Heart failure history: Yes, Class III  Cardiomyopathy history: Yes, Non-Ischemic Cardiomyopathy.  Atrial Fibrillation/Atrial Flutter: Yes, Paroxysmal.  Ventricular tachycardia history: Yes, Hemodynamic instability present. VT Type: Sustained Ventricular Tachycardia - Monomorphic.  Cardiac arrest history: No.  History of  syndromes with risk of sudden death: No.  Previous ICD: Yes, Reason for ICD:  Primary prevention.  Current ICD indication: Secondary  PPM indication: Yes, Bi ventricular pacing  Class I or II Bradycardia indication present: Yes  Beta Blocker therapy for 3 or more months: Yes, prescribed.   Ace Inhibitor/ARB therapy for 3 or more months: Yes, prescribed.    I have seen Fin Hupp Sr. is a 80 y.o. malepre-procedural for consideration of BiV ICD generator change for secondary prevention of sudden death.  The patient's chart has been reviewed and they meet  criteria for BiV ICD generator change.  I have had a thorough discussion with the patient reviewing options.  The patient has had opportunities to ask questions and have them answered. The patient and I have decided together through the [employer] Decision Support Tool to proceed with BiV ICD generator change at this time.  Risks, benefits, alternatives to BiV ICD generator change were discussed in detail with the patient today. The patient  understands that the risks include but are not limited to bleeding, infection, pneumothorax, perforation, tamponade, vascular damage, renal failure, MI, stroke, death, inappropriate shocks, and lead dislodgement and wishes to proceed.    Thompson Grayer MD, [place] 10/22/2020 1:23 PM

## 2020-10-22 NOTE — Discharge Instructions (Signed)
Resume eliquis 10/28/2019 (Sunday) with AM dose. Remove guaze dressing and tegaderm dressing in AM. Do NOT remove steri strips.  Implantable Cardiac Device Battery Change, Care After  This sheet gives you information about how to care for yourself after your procedure. Your health care provider may also give you more specific instructions. If you have problems or questions, contact your health care provider. What can I expect after the procedure? After your procedure, it is common to have:  Pain or soreness at the site where the cardiac device was inserted.  Swelling at the site where the cardiac device was inserted.  You should received an information card for your new device in 4-8 weeks. Follow these instructions at home: Incision care   Keep the incision clean and dry. ? Do not take baths, swim, or use a hot tub until after your wound check.  ? Do not shower for at least 7 days, or as directed by your health care provider. ? Pat the area dry with a clean towel. Do not rub the area. This may cause bleeding.  Follow instructions from your health care provider about how to take care of your incision. Make sure you: ? Leave stitches (sutures), skin glue, or adhesive strips in place. These skin closures may need to stay in place for 2 weeks or longer. If adhesive strip edges start to loosen and curl up, you may trim the loose edges. Do not remove adhesive strips completely unless your health care provider tells you to do that.  Check your incision area every day for signs of infection. Check for: ? More redness, swelling, or pain. ? More fluid or blood. ? Warmth. ? Pus or a bad smell. Activity  Do not lift anything that is heavier than 10 lb (4.5 kg) until your health care provider says it is okay to do so.  For the first week, or as long as told by your health care provider: ? Avoid lifting your affected arm higher than your shoulder. ? After 1 week, Be gentle when you move your  arms over your head. It is okay to raise your arm to comb your hair. ? Avoid strenuous exercise.  Ask your health care provider when it is okay to: ? Resume your normal activities. ? Return to work or school. ? Resume sexual activity. Eating and drinking  Eat a heart-healthy diet. This should include plenty of fresh fruits and vegetables, whole grains, low-fat dairy products, and lean protein like chicken and fish.  Limit alcohol intake to no more than 1 drink a day for non-pregnant women and 2 drinks a day for men. One drink equals 12 oz of beer, 5 oz of wine, or 1 oz of hard liquor.  Check ingredients and nutrition facts on packaged foods and beverages. Avoid the following types of food: ? Food that is high in salt (sodium). ? Food that is high in saturated fat, like full-fat dairy or red meat. ? Food that is high in trans fat, like fried food. ? Food and drinks that are high in sugar. Lifestyle  Do not use any products that contain nicotine or tobacco, such as cigarettes and e-cigarettes. If you need help quitting, ask your health care provider.  Take steps to manage and control your weight.  Once cleared, get regular exercise. Aim for 150 minutes of moderate-intensity exercise (such as walking or yoga) or 75 minutes of vigorous exercise (such as running or swimming) each week.  Manage other health problems, such  as diabetes or high blood pressure. Ask your health care provider how you can manage these conditions. General instructions  Do not drive for 24 hours after your procedure if you were given a medicine to help you relax (sedative).  Take over-the-counter and prescription medicines only as told by your health care provider.  Avoid putting pressure on the area where the cardiac device was placed.  If you need an MRI after your cardiac device has been placed, be sure to tell the health care provider who orders the MRI that you have a cardiac device.  Avoid close and  prolonged exposure to electrical devices that have strong magnetic fields. These include: ? Cell phones. Avoid keeping them in a pocket near the cardiac device, and try using the ear opposite the cardiac device. ? MP3 players. ? Household appliances, like microwaves. ? Metal detectors. ? Electric generators. ? High-tension wires.  Keep all follow-up visits as directed by your health care provider. This is important. Contact a health care provider if:  You have pain at the incision site that is not relieved by over-the-counter or prescription medicines.  You have any of these around your incision site or coming from it: ? More redness, swelling, or pain. ? Fluid or blood. ? Warmth to the touch. ? Pus or a bad smell.  You have a fever.  You feel brief, occasional palpitations, light-headedness, or any symptoms that you think might be related to your heart. Get help right away if:  You experience chest pain that is different from the pain at the cardiac device site.  You develop a red streak that extends above or below the incision site.  You experience shortness of breath.  You have palpitations or an irregular heartbeat.  You have light-headedness that does not go away quickly.  You faint or have dizzy spells.  Your pulse suddenly drops or increases rapidly and does not return to normal.  You begin to gain weight and your legs and ankles swell. Summary  After your procedure, it is common to have pain, soreness, and some swelling where the cardiac device was inserted.  Make sure to keep your incision clean and dry. Follow instructions from your health care provider about how to take care of your incision.  Check your incision every day for signs of infection, such as more pain or swelling, pus or a bad smell, warmth, or leaking fluid and blood.  Avoid strenuous exercise and lifting your left arm higher than your shoulder for 2 weeks, or as long as told by your health care  provider. This information is not intended to replace advice given to you by your health care provider. Make sure you discuss any questions you have with your health care provider.

## 2020-10-23 ENCOUNTER — Ambulatory Visit (INDEPENDENT_AMBULATORY_CARE_PROVIDER_SITE_OTHER): Payer: Medicare Other | Admitting: Physician Assistant

## 2020-10-23 ENCOUNTER — Encounter (HOSPITAL_COMMUNITY): Payer: Self-pay | Admitting: Internal Medicine

## 2020-10-23 ENCOUNTER — Other Ambulatory Visit: Payer: Self-pay

## 2020-10-23 DIAGNOSIS — I5023 Acute on chronic systolic (congestive) heart failure: Secondary | ICD-10-CM | POA: Diagnosis not present

## 2020-10-23 DIAGNOSIS — I420 Dilated cardiomyopathy: Secondary | ICD-10-CM | POA: Diagnosis not present

## 2020-10-23 DIAGNOSIS — E1122 Type 2 diabetes mellitus with diabetic chronic kidney disease: Secondary | ICD-10-CM

## 2020-10-23 DIAGNOSIS — I255 Ischemic cardiomyopathy: Secondary | ICD-10-CM

## 2020-10-23 MED ORDER — SYNJARDY XR 10-1000 MG PO TB24: 1.0000 | ORAL_TABLET | Freq: Every day | ORAL | 0 refills | Status: DC

## 2020-10-23 MED ORDER — SPIRONOLACTONE 25 MG PO TABS: ORAL_TABLET | ORAL | 0 refills | Status: DC

## 2020-10-23 MED ORDER — TRULICITY 1.5 MG/0.5ML ~~LOC~~ SOAJ: SUBCUTANEOUS | 0 refills | Status: DC

## 2020-10-23 NOTE — Progress Notes (Signed)
Subjective:    Patient ID: Earl Hatcher Sr., male    DOB: [birth year], 80 y.o.   MRN: [id number]  HPI  Pt is 80 yo male with CHF, T2DM, HTN, PAF who presents to the clinic for 3 month follow up.   He is doing really well have ICD replacement yesterday. He feels "the best he has in last year".   He is not checking sugars at home.  He is compliant with his Trulicity and Synjardy.  He denies any open sores or wounds.  He denies any chest pain, shortness of breath, palpitations, open sores or wounds.  He has not had any hypoglycemic events.  He is watching his diet and staying active.  . Active Ambulatory Problems    Diagnosis Date Noted  . Essential hypertension, benign 04/28/2013  . BPH (benign prostatic hyperplasia) 04/28/2013  . Gout 04/28/2013  . Congestive dilated cardiomyopathy ([place]) 05/10/2013  . ICD (implantable cardioverter-defibrillator) in place 05/10/2013  . Elevated PSA 11/24/2013  . Systolic dysfunction with acute on chronic heart failure ([place]) 09/03/2014  . Morbid obesity ([place]) 09/03/2014  . Erectile dysfunction 03/10/2015  . Lower extremity edema 09/29/2017  . Abnormal ultrasound of lower extremity 09/29/2017  . Malignant neoplasm of prostate ([place]) 02/03/2018  . Family history of cancer 02/03/2018  . Family history of prostate cancer   . Family history of breast cancer   . Genetic testing 03/25/2018  . Hypotension 05/23/2018  . Paroxysmal atrial fibrillation ([place]) 12/25/2015  . History of gout 08/01/2018  . Type 2 diabetes mellitus with chronic kidney disease, 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[place]) 08/01/2018  . Acute left-sided low back pain without sciatica 05/16/2019  . Syncope and collapse 05/24/2019  . Chronic systolic heart failure ([place]) 06/08/2019  . Ventricular fibrillation ([place]) 08/16/2019  . Fall 08/16/2019  . Impetigo 08/18/2019  . Rectal bleeding 12/11/2019  . Hyperlipidemia LDL goal <70 12/11/2019   Resolved Ambulatory Problems     Diagnosis Date Noted  . Diabetes mellitus due to underlying condition, controlled, without complication, 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[place]) 04/28/2013   Past Medical History:  Diagnosis Date  . CHF (congestive heart failure) ([place])   . Diabetes mellitus ([place])   . Hyperlipidemia   . Hypertension   . Nonischemic cardiomyopathy ([place])   . Prostate cancer [facility])     Review of Systems See HPI.     Objective:   Physical Exam Vitals reviewed.  Constitutional:      Appearance: Normal appearance. He is obese.  HENT:     Head: Normocephalic.     Right Ear: Tympanic membrane normal.     Left Ear: Tympanic membrane normal.  Cardiovascular:     Rate and Rhythm: Normal rate and regular rhythm.     Pulses: Normal pulses.     Heart sounds: Murmur heard.    Pulmonary:     Effort: Pulmonary effort is normal.     Breath sounds: Normal breath sounds.  Musculoskeletal:     Right lower leg: No edema.     Left lower leg: No edema.  Lymphadenopathy:     Cervical: No cervical adenopathy.  Neurological:     General: No focal deficit present.     Mental Status: He is alert and oriented to person, place, and time.  Psychiatric:        Mood and Affect: Mood normal.    .. Results for orders placed or performed during the hospital encounter of 10/22/20  Glucose, capillary  Result Value Ref Range   Glucose-Capillary 92 70 - 99 mg/dL    .[name] Lab Results  Component Value Date   HGBA1C 6.1 (A) 07/29/2020         Assessment & Plan:  [name] KitchenMarland KitchenKodiak was seen today for diabetes.  Diagnoses and all orders for this visit:  Type 2 diabetes mellitus with chronic kidney disease, without long-term current use of insulin, unspecified CKD stage (HCC) -     Dulaglutide (TRULICITY) 1.5 ZH/0.8MV SOPN; 1 Rowe injection weekly. -     Empagliflozin-metFORMIN HCl ER (SYNJARDY XR) 03-999 MG TB24; Take 1 tablet by mouth daily.  Congestive dilated cardiomyopathy (HCC) -     spironolactone (ALDACTONE)  25 MG tablet; TAKE 1/2 (ONE-HALF) TABLET BY MOUTH ONCE DAILY .  Systolic dysfunction with acute on chronic heart failure (HCC) -     spironolactone (ALDACTONE) 25 MG tablet; TAKE 1/2 (ONE-HALF) TABLET BY MOUTH ONCE DAILY .   Sugars staying low at 92 yesterday.  Refilled medications.  On ACE/ARB. bP to goal.  On STATIN.  Foot/eye UTD.  Covid/flu/pneumonia vaccine UTD.  Follow up in 3 months.   No SOB or edema. Follow up cardiology for CHF. Refilled spirolactone.

## 2020-10-28 ENCOUNTER — Encounter: Payer: Self-pay | Admitting: Physician Assistant

## 2020-11-04 ENCOUNTER — Other Ambulatory Visit: Payer: Self-pay

## 2020-11-04 ENCOUNTER — Ambulatory Visit (INDEPENDENT_AMBULATORY_CARE_PROVIDER_SITE_OTHER): Payer: Medicare Other | Admitting: Student

## 2020-11-04 ENCOUNTER — Encounter: Payer: Self-pay | Admitting: Student

## 2020-11-04 VITALS — Ht 70.0 in

## 2020-11-04 DIAGNOSIS — I5022 Chronic systolic (congestive) heart failure: Secondary | ICD-10-CM

## 2020-11-04 LAB — CUP PACEART INCLINIC DEVICE CHECK
Battery Remaining Longevity: 91 mo
Brady Statistic RA Percent Paced: 99 %
Brady Statistic RV Percent Paced: 98 %
Date Time Interrogation Session: 20220509134232
HighPow Impedance: 44.1118
Implantable Lead Implant Date: 20140117
Implantable Lead Implant Date: 20140117
Implantable Lead Implant Date: 20140117
Implantable Lead Location: 753858
Implantable Lead Location: 753859
Implantable Lead Location: 753860
Implantable Pulse Generator Implant Date: 20220426
Lead Channel Impedance Value: 437.5 Ohm
Lead Channel Impedance Value: 437.5 Ohm
Lead Channel Impedance Value: 962.5 Ohm
Lead Channel Pacing Threshold Amplitude: 0.875 V
Lead Channel Pacing Threshold Amplitude: 0.875 V
Lead Channel Pacing Threshold Amplitude: 1.5 V
Lead Channel Pacing Threshold Pulse Width: 0.5 ms
Lead Channel Pacing Threshold Pulse Width: 0.5 ms
Lead Channel Pacing Threshold Pulse Width: 0.5 ms
Lead Channel Sensing Intrinsic Amplitude: 12 mV
Lead Channel Sensing Intrinsic Amplitude: 5 mV
Lead Channel Setting Pacing Amplitude: 1.375
Lead Channel Setting Pacing Amplitude: 1.875
Lead Channel Setting Pacing Amplitude: 2.5 V
Lead Channel Setting Pacing Pulse Width: 0.5 ms
Lead Channel Setting Pacing Pulse Width: 0.5 ms
Lead Channel Setting Sensing Sensitivity: 0.5 mV
Pulse Gen Serial Number: 810027402

## 2020-11-04 NOTE — Patient Instructions (Signed)
Medication Instructions:  Your physician recommends that you continue on your current medications as directed. Please refer to the Current Medication list given to you today.  *If you need a refill on your cardiac medications before your next appointment, please call your pharmacy*   Lab Work: None If you have labs (blood work) drawn today and your tests are completely normal, you will receive your results only by: MyChart Message (if you have MyChart) OR A paper copy in the mail If you have any lab test that is abnormal or we need to change your treatment, we will call you to review the results.   Follow-Up: At [facility], you and your health needs are our priority.  As part of our continuing mission to provide you with exceptional heart care, we have created designated Provider Care Teams.  These Care Teams include your primary Cardiologist (physician) and Advanced Practice Providers (APPs -  Physician Assistants and Nurse Practitioners) who all work together to provide you with the care you need, when you need it.  We recommend signing up for the patient portal called "MyChart".  Sign up information is provided on this After Visit Summary.  MyChart is used to connect with patients for Virtual Visits (Telemedicine).  Patients are able to view lab/test results, encounter notes, upcoming appointments, etc.  Non-urgent messages can be sent to your provider as well.   To learn more about what you can do with MyChart, go to [email or web address].    Your next appointment:   As scheduled 

## 2020-11-04 NOTE — Progress Notes (Signed)
Wound check appointment. Steri-strips removed. Wound without redness or edema. Incision edges approximated, wound well healed. Normal device function. Thresholds, sensing, and impedances consistent with implant measurements. Device programmed at appropriate safety margins for chronic lead.   Histogram with most HRs in 60 range despite DDDR. Slope and threshold adjusted today for more response. No mode switches or ventricular arrhythmias noted. Patient educated about wound care, arm mobility, lifting restrictions, shock plan.   V-V optimization attempted. Pt has no good options for Multi-Site pacing with poor thresholds apart from vectors involving P1 or unipolar. P1-M2 and P1-M3 are decent alternatives, but P1-M4 remains best option. LV->RV 80 ms recommended today (from 70 ms) with slightly improvement in QRS.   ROV in 3 months with implanting physician.

## 2020-11-05 ENCOUNTER — Ambulatory Visit: Payer: BC Managed Care – PPO

## 2020-11-07 ENCOUNTER — Other Ambulatory Visit: Payer: Self-pay | Admitting: Internal Medicine

## 2020-11-07 DIAGNOSIS — I420 Dilated cardiomyopathy: Secondary | ICD-10-CM

## 2020-11-16 ENCOUNTER — Other Ambulatory Visit: Payer: Self-pay | Admitting: Physician Assistant

## 2020-11-16 DIAGNOSIS — I10 Essential (primary) hypertension: Secondary | ICD-10-CM

## 2020-11-16 DIAGNOSIS — I420 Dilated cardiomyopathy: Secondary | ICD-10-CM

## 2020-11-16 DIAGNOSIS — I5023 Acute on chronic systolic (congestive) heart failure: Secondary | ICD-10-CM

## 2020-11-18 ENCOUNTER — Other Ambulatory Visit: Payer: Self-pay | Admitting: Physician Assistant

## 2020-11-18 DIAGNOSIS — I5023 Acute on chronic systolic (congestive) heart failure: Secondary | ICD-10-CM

## 2020-11-18 DIAGNOSIS — I420 Dilated cardiomyopathy: Secondary | ICD-10-CM

## 2020-11-18 DIAGNOSIS — I10 Essential (primary) hypertension: Secondary | ICD-10-CM

## 2020-11-26 ENCOUNTER — Telehealth: Payer: Self-pay | Admitting: Emergency Medicine

## 2020-11-26 NOTE — Telephone Encounter (Signed)
Patient reviewed ATP therapy on May 29th at 12:35 pm. Alert received questions possible inappropriate therapy. Transmission reviewed by Dr. Curt Bears who suspects that the arrhythmia is atrial driven. Dr. Curt Bears suggest this be sent to Dr. Rayann Heman for review.

## 2020-11-28 ENCOUNTER — Other Ambulatory Visit: Payer: Self-pay | Admitting: Physician Assistant

## 2020-11-28 DIAGNOSIS — E1122 Type 2 diabetes mellitus with diabetic chronic kidney disease: Secondary | ICD-10-CM

## 2020-12-01 NOTE — Telephone Encounter (Signed)
Arrhythmia event noted. Lets get him back in to see Jonni Sanger for follow-up.

## 2020-12-03 ENCOUNTER — Other Ambulatory Visit: Payer: Self-pay

## 2020-12-03 ENCOUNTER — Ambulatory Visit (INDEPENDENT_AMBULATORY_CARE_PROVIDER_SITE_OTHER): Payer: Medicare Other | Admitting: Student

## 2020-12-03 ENCOUNTER — Encounter: Payer: Self-pay | Admitting: Student

## 2020-12-03 VITALS — BP 120/60 | HR 65 | Ht 70.0 in | Wt 236.0 lb

## 2020-12-03 DIAGNOSIS — I471 Supraventricular tachycardia: Secondary | ICD-10-CM | POA: Diagnosis not present

## 2020-12-03 DIAGNOSIS — Z95810 Presence of automatic (implantable) cardiac defibrillator: Secondary | ICD-10-CM | POA: Diagnosis not present

## 2020-12-03 DIAGNOSIS — I472 Ventricular tachycardia, unspecified: Secondary | ICD-10-CM

## 2020-12-03 DIAGNOSIS — I480 Paroxysmal atrial fibrillation: Secondary | ICD-10-CM | POA: Diagnosis not present

## 2020-12-03 DIAGNOSIS — I5022 Chronic systolic (congestive) heart failure: Secondary | ICD-10-CM

## 2020-12-03 DIAGNOSIS — I255 Ischemic cardiomyopathy: Secondary | ICD-10-CM

## 2020-12-03 LAB — CUP PACEART INCLINIC DEVICE CHECK
Battery Remaining Longevity: 88 mo
Brady Statistic RA Percent Paced: 98 %
Brady Statistic RV Percent Paced: 97 %
Date Time Interrogation Session: 20220607111544
HighPow Impedance: 40.7608
Implantable Lead Implant Date: 20140117
Implantable Lead Implant Date: 20140117
Implantable Lead Implant Date: 20140117
Implantable Lead Location: 753858
Implantable Lead Location: 753859
Implantable Lead Location: 753860
Implantable Pulse Generator Implant Date: 20220426
Lead Channel Impedance Value: 425 Ohm
Lead Channel Impedance Value: 425 Ohm
Lead Channel Impedance Value: 937.5 Ohm
Lead Channel Pacing Threshold Amplitude: 0.75 V
Lead Channel Pacing Threshold Amplitude: 0.875 V
Lead Channel Pacing Threshold Amplitude: 1.25 V
Lead Channel Pacing Threshold Pulse Width: 0.5 ms
Lead Channel Pacing Threshold Pulse Width: 0.5 ms
Lead Channel Pacing Threshold Pulse Width: 0.5 ms
Lead Channel Sensing Intrinsic Amplitude: 12 mV
Lead Channel Sensing Intrinsic Amplitude: 5 mV
Lead Channel Setting Pacing Amplitude: 1.25 V
Lead Channel Setting Pacing Amplitude: 1.875
Lead Channel Setting Pacing Amplitude: 2.25 V
Lead Channel Setting Pacing Pulse Width: 0.5 ms
Lead Channel Setting Pacing Pulse Width: 0.5 ms
Lead Channel Setting Sensing Sensitivity: 0.5 mV
Pulse Gen Serial Number: 810027402

## 2020-12-03 LAB — COMPREHENSIVE METABOLIC PANEL
ALT: 32 IU/L (ref 0–44)
AST: 31 IU/L (ref 0–40)
Albumin/Globulin Ratio: 2 (ref 1.2–2.2)
Albumin: 4.3 g/dL (ref 3.7–4.7)
Alkaline Phosphatase: 71 IU/L (ref 44–121)
BUN/Creatinine Ratio: 15 (ref 10–24)
BUN: 18 mg/dL (ref 8–27)
Bilirubin Total: 0.6 mg/dL (ref 0.0–1.2)
CO2: 23 mmol/L (ref 20–29)
Calcium: 9.5 mg/dL (ref 8.6–10.2)
Chloride: 106 mmol/L (ref 96–106)
Creatinine, Ser: 1.21 mg/dL (ref 0.76–1.27)
Globulin, Total: 2.2 g/dL (ref 1.5–4.5)
Glucose: 98 mg/dL (ref 65–99)
Potassium: 5.1 mmol/L (ref 3.5–5.2)
Sodium: 142 mmol/L (ref 134–144)
Total Protein: 6.5 g/dL (ref 6.0–8.5)
eGFR: 61 mL/min/{1.73_m2} (ref >59)

## 2020-12-03 LAB — MAGNESIUM: Magnesium: 2 mg/dL (ref 1.6–2.3)

## 2020-12-03 NOTE — Patient Instructions (Signed)
Medication Instructions:  Your physician recommends that you continue on your current medications as directed. Please refer to the Current Medication list given to you today.  *If you need a refill on your cardiac medications before your next appointment, please call your pharmacy*   Lab Work: TODAY: CMET, Mg  If you have labs (blood work) drawn today and your tests are completely normal, you will receive your results only by: [name] MyChart Message (if you have MyChart) OR . A paper copy in the mail If you have any lab test that is abnormal or we need to change your treatment, we will call you to review the results.   Follow-Up: At [facility], you and your health needs are our priority.  As part of our continuing mission to provide you with exceptional heart care, we have created designated Provider Care Teams.  These Care Teams include your primary Cardiologist (physician) and Advanced Practice Providers (APPs -  Physician Assistants and Nurse Practitioners) who all work together to provide you with the care you need, when you need it.  We recommend signing up for the patient portal called "MyChart".  Sign up information is provided on this After Visit Summary.  MyChart is used to connect with patients for Virtual Visits (Telemedicine).  Patients are able to view lab/test results, encounter notes, upcoming appointments, etc.  Non-urgent messages can be sent to your provider as well.   To learn more about what you can do with MyChart, go to [email or web address].    Your next appointment:   As scheduled

## 2020-12-03 NOTE — Progress Notes (Signed)
Electrophysiology Office Note Date: 12/03/2020  ID:  Earl Hatcher Sr., DOB 04-Jul-1940, MRN [id number]  PCP: Donella Stade, PA-C Primary Cardiologist: Kirk Ruths, MD Electrophysiologist: Dr. Rayann Heman  CC: Routine ICD follow-up  Earl Hatcher Sr. is a 80 y.o. male seen today for Dr. Rayann Heman for acute visit due to [facility] on device transmission.  Since last being seen in our clinic the patient reports doing very well. He did not notice his fast HRs that day, 11/24/2020. He had left town on Saturday and left most of his medications at home. He took them as scheduled when he got home that evening, and has had no further events by his device. He denies chest pain, palpitations, dyspnea, PND, orthopnea, nausea, vomiting, dizziness, syncope, edema, weight gain, or early satiety. He has not had ICD shocks.   Device History: STJ CRTD implanted 2014, gen change 10/22/20 for NICM and CHF History of appropriate therapy: Yes History of AAD therapy: No  Past Medical History:  Diagnosis Date  . BPH (benign prostatic hyperplasia)   . CHF (congestive heart failure) ([place])   . Diabetes mellitus ([place])    Type II. Diet controlled  . Family history of breast cancer   . Family history of prostate cancer   . Gout   . Hyperlipidemia   . Hypertension   . Nonischemic cardiomyopathy ([place])    a. s/p STJ CRTD  . Paroxysmal atrial fibrillation ([place]) 12/25/15  . Prostate cancer [facility])    Past Surgical History:  Procedure Laterality Date  . BIV ICD GENERATOR CHANGEOUT N/A 10/22/2020   Procedure: BIV ICD GENERATOR CHANGEOUT;  Surgeon: Thompson Grayer, MD;  Location: [place] CV LAB;  Service: Cardiovascular;  Laterality: N/A;  . CARDIAC DEFIBRILLATOR PLACEMENT  Jan 2014   SJM Quadra Assura implanted by Dr [name] in [place]    Current Outpatient Medications  Medication Sig Dispense Refill  . AMBULATORY NON FORMULARY MEDICATION shingrx 2 doses to prevent shingles. 2 application 0  . atorvastatin  (LIPITOR) 80 MG tablet Take 1 tablet by mouth once daily 90 tablet 3  . carvedilol (COREG) 25 MG tablet TAKE 1 TABLET BY MOUTH TWICE DAILY WITH MEALS 180 tablet 1  . Dulaglutide (TRULICITY) 1.5 MH/9.6QI SOPN Inject 1.5 mg into the skin once a week. 6 mL 0  . ELIQUIS 5 MG TABS tablet Take 1 tablet by mouth twice daily 180 tablet 1  . Empagliflozin-metFORMIN HCl ER (SYNJARDY XR) 03-999 MG TB24 Take 1 tablet by mouth daily. 90 tablet 0  . ENTRESTO 97-103 MG Take 1 tablet by mouth twice daily 60 tablet 0  . furosemide (LASIX) 40 MG tablet Take 1 tablet by mouth once daily 90 tablet 0  . sennosides-docusate sodium (SENOKOT-S) 8.6-50 MG tablet Take 1 tablet by mouth daily as needed. For constipation    . sildenafil (VIAGRA) 100 MG tablet TAKE 1 TABLET BY MOUTH AS NEEDED FOR  ERECTILE  DYSFUNCTION 10 tablet 2  . spironolactone (ALDACTONE) 25 MG tablet TAKE 1/2 (ONE-HALF) TABLET BY MOUTH ONCE DAILY . 45 tablet 0   No current facility-administered medications for this visit.    Allergies:   Patient has no known allergies.   Social History: Social History   Socioeconomic History  . Marital status: Widowed    Spouse name: Not on file  . Number of children: 1  . Years of education: masters  . Highest education level: Master's degree (e.g., MA, MS, MEng, MEd, MSW, MBA)  Occupational History  .  Occupation: retired    Comment: [employer]  Tobacco Use  . Smoking status: Never Smoker  . Smokeless tobacco: Never Used  Vaping Use  . Vaping Use: Never used  Substance and Sexual Activity  . Alcohol use: Yes    Comment: Occasional  . Drug use: No  . Sexual activity: Yes  Other Topics Concern  . Not on file  Social History Narrative   Previously lived in [place] before moving to [place].  He has recently relocated to [facility]. 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   Social Determinants of Health   Financial Resource Strain: Not on file  Food Insecurity: Not on file  Transportation  Needs: Not on file  Physical Activity: Not on file  Stress: Not on file  Social Connections: Not on file  Intimate Partner Violence: Not on file    Family History: Family History  Problem Relation Age of Onset  . Diabetes Mother   . Hyperlipidemia Mother   . Heart attack Brother   . Breast cancer Sister 70  . Prostate cancer Maternal Uncle   . Prostate cancer Cousin        [name] vet, agent orange exposure  . Prostate cancer Cousin        dx 28-30 yrs. - mat first cousin's son  . Prostate cancer Cousin   . Prostate cancer Cousin   . Diabetes Brother   . Throat cancer Brother 31       [place] vet, ? due to agent orange exposure    Review of Systems: All other systems reviewed and are otherwise negative except as noted above.   Physical Exam: Vitals:   12/03/20 1054  BP: 120/60  Pulse: 65  SpO2: 90%  Weight: 236 lb (107 kg)  Height: 5\' 10"  (1.778 m)     GEN- The patient is well appearing, alert and oriented x 3 today.   HEENT: normocephalic, atraumatic; sclera clear, conjunctiva pink; hearing intact; oropharynx clear; neck supple, no JVP Lymph- no cervical lymphadenopathy Lungs- Clear to ausculation bilaterally, normal work of breathing.  No wheezes, rales, rhonchi Heart- Regular rate and rhythm, no murmurs, rubs or gallops, PMI not laterally displaced GI- soft, non-tender, non-distended, bowel sounds present, no hepatosplenomegaly Extremities- no clubbing or cyanosis. No edema; DP/PT/radial pulses 2+ bilaterally MS- no significant deformity or atrophy Skin- warm and dry, no rash or lesion; ICD pocket well healed Psych- euthymic mood, full affect Neuro- strength and sensation are intact  ICD interrogation- reviewed in detail today,  See PACEART report  EKG:  EKG is not ordered today.  Recent Labs: 12/13/2019: ALT 17 10/11/2020: BUN 18; Creatinine, Ser 1.32; Hemoglobin 15.3; Platelets 208; Potassium 5.0; Sodium 144   Wt Readings from Last 3 Encounters:   12/03/20 236 lb (107 kg)  10/23/20 239 lb (108.4 kg)  10/22/20 235 lb (106.6 kg)     Other studies Reviewed: Additional studies/ records that were reviewed today include: Previous EP office notes and recent transmission   Assessment and Plan:  1.  Chronic systolic dysfunction s/p St. Jude CRT-D  euvolemic today Stable on an appropriate medical regimen Normal ICD function See Pace Art report No changes today  2. VT Continue coreg. Stressed importance of compliance Consider AAD if episodes increase ATP 11/24/2020 was inappropriate as tachycardia appears atrially driven. He has previously had VT below detection, so no programming changes were made today.   3. Paroxysmal atrial fibrillation On Eliquis for CHA2DS2VASC of at least 6.  Low burden overall. His recent arrhythmias were all on 5/29 in setting of missing several doses of medication. Stressed importance of compliance.  4. HTN Continue Entresto 97/103 mg BID and coreg 25 mg BID Labs today  Current medicines are reviewed at length with the patient today.   The patient does not have concerns regarding his medicines.  The following changes were made today:  none  Labs/ tests ordered today include:  Orders Placed This Encounter  Procedures  . Comprehensive metabolic panel  . Magnesium  . CUP PACEART INCLINIC DEVICE CHECK     Disposition:   Follow up with Dr. Rayann Heman as scheduled for 91 day post gen change f/u.   Jacalyn Lefevre, PA-C  12/03/2020 12:53 PM  [address] [place]  [phone] (office) [phone] (fax)

## 2020-12-08 ENCOUNTER — Other Ambulatory Visit: Payer: Self-pay | Admitting: Internal Medicine

## 2020-12-08 DIAGNOSIS — I420 Dilated cardiomyopathy: Secondary | ICD-10-CM

## 2020-12-13 ENCOUNTER — Telehealth: Payer: Self-pay

## 2020-12-13 NOTE — Telephone Encounter (Signed)
ICM Call to patient. Advised after new device battery was implanted the CorVue Monitoring was not switched to on but the device is working correctly.   Explained unable to see any fluid level recordings.  He agreed to device clinic appointment to turn CorVue on.   Message sent to device clinic to call patient to set device clinic appointment to turn on [place].

## 2020-12-16 ENCOUNTER — Telehealth: Payer: Self-pay

## 2020-12-16 NOTE — Telephone Encounter (Signed)
-----   Message from Rosalene Billings, RN sent at 12/13/2020  4:05 PM EDT ----- Regarding: Device appt to turn on Corvue Hey,  Can you call patient to make a device clinic appointment to turn on Corvue?  It was not turned on after his battery was replaced in April.   His next appointment is not until the end of July.  He is willing to come to [address] office.    Thanks,  Margarita Grizzle

## 2020-12-16 NOTE — Telephone Encounter (Signed)
Device Clinic apt. Made 12/19/20 @ 4:00 pm. Location, date and time discussed with patient.

## 2020-12-19 ENCOUNTER — Other Ambulatory Visit: Payer: Self-pay

## 2020-12-19 ENCOUNTER — Ambulatory Visit (INDEPENDENT_AMBULATORY_CARE_PROVIDER_SITE_OTHER): Payer: Medicare Other

## 2020-12-19 DIAGNOSIS — I4901 Ventricular fibrillation: Secondary | ICD-10-CM

## 2020-12-19 LAB — CUP PACEART INCLINIC DEVICE CHECK
Date Time Interrogation Session: 20220623163355
Implantable Lead Implant Date: 20140117
Implantable Lead Implant Date: 20140117
Implantable Lead Implant Date: 20140117
Implantable Lead Location: 753858
Implantable Lead Location: 753859
Implantable Lead Location: 753860
Implantable Pulse Generator Implant Date: 20220426
Pulse Gen Serial Number: 810027402

## 2020-12-19 NOTE — Progress Notes (Signed)
Patient seen in device clinic. CoreVue programmed "ON".

## 2020-12-20 NOTE — Progress Notes (Signed)
No ICM remote transmission received for 12/09/2020 due to CorVue diagnostics were not turned on and no recordings.  Pt seen in device clinic 12/19/2020 and CorVue programmed on and next ICM transmission scheduled for 01/27/2021.

## 2020-12-23 ENCOUNTER — Ambulatory Visit: Payer: BC Managed Care – PPO | Admitting: Internal Medicine

## 2020-12-29 ENCOUNTER — Other Ambulatory Visit: Payer: Self-pay | Admitting: Physician Assistant

## 2020-12-29 DIAGNOSIS — E1122 Type 2 diabetes mellitus with diabetic chronic kidney disease: Secondary | ICD-10-CM

## 2021-01-05 ENCOUNTER — Other Ambulatory Visit: Payer: Self-pay | Admitting: Physician Assistant

## 2021-01-05 DIAGNOSIS — E1122 Type 2 diabetes mellitus with diabetic chronic kidney disease: Secondary | ICD-10-CM

## 2021-01-13 ENCOUNTER — Other Ambulatory Visit: Payer: Self-pay | Admitting: Physician Assistant

## 2021-01-13 DIAGNOSIS — E782 Mixed hyperlipidemia: Secondary | ICD-10-CM

## 2021-01-15 ENCOUNTER — Other Ambulatory Visit: Payer: Self-pay | Admitting: Cardiology

## 2021-01-15 DIAGNOSIS — I5023 Acute on chronic systolic (congestive) heart failure: Secondary | ICD-10-CM

## 2021-01-15 DIAGNOSIS — I420 Dilated cardiomyopathy: Secondary | ICD-10-CM

## 2021-01-19 DIAGNOSIS — Z23 Encounter for immunization: Secondary | ICD-10-CM | POA: Diagnosis not present

## 2021-01-19 DIAGNOSIS — U071 COVID-19: Secondary | ICD-10-CM | POA: Diagnosis not present

## 2021-01-22 ENCOUNTER — Ambulatory Visit (INDEPENDENT_AMBULATORY_CARE_PROVIDER_SITE_OTHER): Payer: Medicare Other | Admitting: Physician Assistant

## 2021-01-22 ENCOUNTER — Other Ambulatory Visit: Payer: Self-pay

## 2021-01-22 ENCOUNTER — Encounter: Payer: Self-pay | Admitting: Physician Assistant

## 2021-01-22 VITALS — Ht 70.0 in | Wt 237.0 lb

## 2021-01-22 DIAGNOSIS — I959 Hypotension, unspecified: Secondary | ICD-10-CM | POA: Diagnosis not present

## 2021-01-22 DIAGNOSIS — E1122 Type 2 diabetes mellitus with diabetic chronic kidney disease: Secondary | ICD-10-CM

## 2021-01-22 DIAGNOSIS — I255 Ischemic cardiomyopathy: Secondary | ICD-10-CM | POA: Diagnosis not present

## 2021-01-22 LAB — POCT GLYCOSYLATED HEMOGLOBIN (HGB A1C): HbA1c, POC (controlled diabetic range): 6.6 % (ref 0.0–7.0)

## 2021-01-22 MED ORDER — TRULICITY 1.5 MG/0.5ML ~~LOC~~ SOAJ: 1.5000 mg | SUBCUTANEOUS | 0 refills | Status: DC

## 2021-01-22 MED ORDER — SYNJARDY XR 10-1000 MG PO TB24: 1.0000 | ORAL_TABLET | Freq: Every day | ORAL | 0 refills | Status: DC

## 2021-01-22 NOTE — Progress Notes (Signed)
Diabetes Follow-Up Visit Chief Complaint:   Chief Complaint  Patient presents with   Diabetes     Exam Regularity:  WNL Edema:  none  Polyuria:  frequent urination due to diuretics  Polydipsia:  none Polyphagia:  none  BMI:  Body mass index is 34.01 kg/m.   Weight changes:  1lb in one month General Appearance:  alert, oriented, no acute distress Mood/Affect:  normal  HPI:  Pt is taking medication regularly. He did have some swelling of lower ext on Monday and took 2 lasix. Today he was  Low fat/carbohydrate diet?  Yes Nicotine Abuse?  No Medication Compliance?  Yes Exercise?  Pt tries to stay active but does not exercise.  Alcohol Abuse?  Yes  Home BG Monitoring:  Checking 0 times a day.  .. Active Ambulatory Problems    Diagnosis Date Noted   Essential hypertension, benign 04/28/2013   BPH (benign prostatic hyperplasia) 04/28/2013   Gout 04/28/2013   Congestive dilated cardiomyopathy ([place]) 05/10/2013   ICD (implantable cardioverter-defibrillator) in place 05/10/2013   Elevated PSA [id number]   Systolic dysfunction with acute on chronic heart failure ([place]) 09/03/2014   Morbid obesity ([place]) 09/03/2014   Erectile dysfunction 03/10/2015   Lower extremity edema 09/29/2017   Abnormal ultrasound of lower extremity 09/29/2017   Malignant neoplasm of prostate ([place]) 02/03/2018   Family history of cancer 02/03/2018   Family history of prostate cancer    Family history of breast cancer    Genetic testing 03/25/2018   Hypotension 05/23/2018   Paroxysmal atrial fibrillation ([place]) 12/25/2015   History of gout 08/01/2018   Type 2 diabetes mellitus with chronic kidney disease, 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[place]) 08/01/2018   Acute left-sided low back pain without sciatica 05/16/2019   Syncope and collapse [id number]   Chronic systolic heart failure ([place]) 06/08/2019   Ventricular fibrillation ([place]) 08/16/2019   Fall 08/16/2019   Impetigo 08/18/2019   Rectal bleeding  12/11/2019   Hyperlipidemia LDL goal <70 12/11/2019   Resolved Ambulatory Problems    Diagnosis Date Noted   Diabetes mellitus due to underlying condition, controlled, without complication, 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[place]) 04/28/2013   Past Medical History:  Diagnosis Date   CHF (congestive heart failure) ([place])    Diabetes mellitus ([place])    Hyperlipidemia    Hypertension    Nonischemic cardiomyopathy ([place])    Prostate cancer ([place])       Lab Results  Component Value Date   HGBA1C 6.1 (A) 07/29/2020    Lab Results  Component Value Date   MICROALBUR 30 08/16/2019    Lab Results  Component Value Date   CHOL 136 12/13/2019   HDL 38 (L) 12/13/2019   LDLCALC 80 12/13/2019   TRIG 96 12/13/2019   CHOLHDL 3.6 12/13/2019    .[name] Results for orders placed or performed in visit on 01/22/21  POCT glycosylated hemoglobin (Hb A1C)  Result Value Ref Range   Hemoglobin A1C     HbA1c POC (<> result, manual entry)     HbA1c, POC (prediabetic range)     HbA1c, POC (controlled diabetic range) 6.6 0.0 - 7.0 %     Assessment: 1.  Diabetes sugars are up from last visit but under 7. Refilled trulicity and synjardy today.  2.  Blood Pressure.  Hypotensive today. Could be that he is not drinking enough or medications needs to be adjusted. Increase hydration when BP that low and eat something a little salty. Follow up  with cardiology tomorrow.  3.  Lipids. To goal. 4.  Foot Care.  UTD 5.  Dental Care.  Pt does not go to dentist. 6.  Eye Care/Exam.  UTD.  CHF- does not appear fluid overloaded today. Could be dehydrated as cause of hypotension.   Recommendations: 1.  Patient is counseled on appropriate foot care.  2.  BP goal < 130/80. 3.  LDL goal of < 100, HDL > 40 and TG < 150. 4.  Eye Exam yearly and Dental Exam every 6 months. 5.  Dietary recommendations:  low salt watch fluid intake. Do not drink sugary beverages.  6.   Medication recommendations at this time are as  follows:  see med list.  7.   Return to clinic in 3-4 months   Time spent counseling patient:  53  Physician time spent with patient:  [place], PA-C       Patient ID: Earl Stamp Sr., male   DOB: [birth year], 80 y.o.   MRN: [id number]

## 2021-01-23 ENCOUNTER — Encounter (INDEPENDENT_AMBULATORY_CARE_PROVIDER_SITE_OTHER): Payer: Self-pay

## 2021-01-23 ENCOUNTER — Encounter: Payer: Self-pay | Admitting: Internal Medicine

## 2021-01-23 ENCOUNTER — Ambulatory Visit (INDEPENDENT_AMBULATORY_CARE_PROVIDER_SITE_OTHER): Payer: Medicare Other | Admitting: Internal Medicine

## 2021-01-23 VITALS — BP 124/76 | HR 68 | Ht 70.0 in | Wt 240.8 lb

## 2021-01-23 DIAGNOSIS — I5022 Chronic systolic (congestive) heart failure: Secondary | ICD-10-CM | POA: Diagnosis not present

## 2021-01-23 DIAGNOSIS — I480 Paroxysmal atrial fibrillation: Secondary | ICD-10-CM | POA: Diagnosis not present

## 2021-01-23 DIAGNOSIS — E785 Hyperlipidemia, unspecified: Secondary | ICD-10-CM | POA: Diagnosis not present

## 2021-01-23 DIAGNOSIS — I10 Essential (primary) hypertension: Secondary | ICD-10-CM | POA: Diagnosis not present

## 2021-01-23 DIAGNOSIS — I255 Ischemic cardiomyopathy: Secondary | ICD-10-CM | POA: Diagnosis not present

## 2021-01-23 DIAGNOSIS — I471 Supraventricular tachycardia: Secondary | ICD-10-CM | POA: Diagnosis not present

## 2021-01-23 LAB — CUP PACEART INCLINIC DEVICE CHECK
Battery Remaining Longevity: 87 mo
Brady Statistic RA Percent Paced: 98 %
Brady Statistic RV Percent Paced: 97 %
Date Time Interrogation Session: 20220728145546
HighPow Impedance: 41.0431
Implantable Lead Implant Date: 20140117
Implantable Lead Implant Date: 20140117
Implantable Lead Implant Date: 20140117
Implantable Lead Location: 753858
Implantable Lead Location: 753859
Implantable Lead Location: 753860
Implantable Pulse Generator Implant Date: 20220426
Lead Channel Impedance Value: 412.5 Ohm
Lead Channel Impedance Value: 425 Ohm
Lead Channel Impedance Value: 950 Ohm
Lead Channel Pacing Threshold Amplitude: 0.875 V
Lead Channel Pacing Threshold Amplitude: 1 V
Lead Channel Pacing Threshold Amplitude: 1.25 V
Lead Channel Pacing Threshold Pulse Width: 0.5 ms
Lead Channel Pacing Threshold Pulse Width: 0.5 ms
Lead Channel Pacing Threshold Pulse Width: 0.5 ms
Lead Channel Sensing Intrinsic Amplitude: 12 mV
Lead Channel Sensing Intrinsic Amplitude: 5 mV
Lead Channel Setting Pacing Amplitude: 1.375
Lead Channel Setting Pacing Amplitude: 2 V
Lead Channel Setting Pacing Amplitude: 2.25 V
Lead Channel Setting Pacing Pulse Width: 0.5 ms
Lead Channel Setting Pacing Pulse Width: 0.5 ms
Lead Channel Setting Sensing Sensitivity: 0.5 mV
Pulse Gen Serial Number: 810027402

## 2021-01-23 NOTE — Progress Notes (Signed)
PCP: Donella Stade, PA-C   Primary EP: Dr Annamarie Major Sr. is a 80 y.o. male who presents today for routine electrophysiology followup.  Since last being seen in our clinic, the patient reports doing very well.  Today, he denies symptoms of palpitations, chest pain, shortness of breath,  lower extremity edema, dizziness, presyncope, syncope, or ICD shocks.  The patient is otherwise without complaint today.   Past Medical History:  Diagnosis Date   BPH (benign prostatic hyperplasia)    CHF (congestive heart failure) ([place])    Diabetes mellitus ([place])    Type II. Diet controlled   Family history of breast cancer    Family history of prostate cancer    Gout    Hyperlipidemia    Hypertension    Nonischemic cardiomyopathy ([place])    a. s/p STJ CRTD   Paroxysmal atrial fibrillation ([place]) 12/25/15   Prostate cancer [facility])    Past Surgical History:  Procedure Laterality Date   BIV ICD GENERATOR CHANGEOUT N/A 10/22/2020   Procedure: BIV ICD GENERATOR CHANGEOUT;  Surgeon: Thompson Grayer, MD;  Location: [place] CV LAB;  Service: Cardiovascular;  Laterality: N/A;   CARDIAC DEFIBRILLATOR PLACEMENT  Jan 2014   SJM Quadra Assura implanted by Dr [name] in [place] are reviewed and negative except as per HPI above  Current Outpatient Medications  Medication Sig Dispense Refill   AMBULATORY NON FORMULARY MEDICATION shingrx 2 doses to prevent shingles. 2 application 0   atorvastatin (LIPITOR) 80 MG tablet Take 1 tablet by mouth once daily/ LABS FOR FURTHER REFILLS 90 tablet 0   carvedilol (COREG) 25 MG tablet TAKE 1 TABLET BY MOUTH TWICE DAILY WITH MEALS 180 tablet 1   Dulaglutide (TRULICITY) 1.5 0000000 SOPN Inject 1.5 mg into the skin once a week. 6 mL 0   ELIQUIS 5 MG TABS tablet Take 1 tablet by mouth twice daily 180 tablet 1   Empagliflozin-metFORMIN HCl ER (SYNJARDY XR) 03-999 MG TB24 Take 1 tablet by mouth daily. 90 tablet 0   ENTRESTO 97-103  MG Take 1 tablet by mouth twice daily 60 tablet 11   furosemide (LASIX) 40 MG tablet Take 1 tablet by mouth once daily 90 tablet 0   sennosides-docusate sodium (SENOKOT-S) 8.6-50 MG tablet Take 1 tablet by mouth daily as needed. For constipation     sildenafil (VIAGRA) 100 MG tablet TAKE 1 TABLET BY MOUTH AS NEEDED FOR  ERECTILE  DYSFUNCTION 10 tablet 2   spironolactone (ALDACTONE) 25 MG tablet Take 1/2 (one-half) tablet by mouth once daily 45 tablet 0   No current facility-administered medications for this visit.    Physical Exam: Vitals:   01/23/21 1433  BP: 124/76  Pulse: 68  SpO2: 92%  Weight: 240 lb 12.8 oz (109.2 kg)  Height: '5\' 10"'$  (1.778 m)    GEN- The patient is well appearing, alert and oriented x 3 today.   Head- normocephalic, atraumatic Eyes-  Sclera clear, conjunctiva pink Ears- hearing intact Oropharynx- clear Lungs- Clear to ausculation bilaterally, normal work of breathing Chest- ICD pocket is well healed Heart- Regular rate and rhythm, no murmurs, rubs or gallops, PMI not laterally displaced GI- soft, NT, ND, + BS Extremities- no clubbing, cyanosis, or edema  ICD interrogation- reviewed in detail today,  See PACEART report  ekg tracing ordered today is personally reviewed and shows a paced, BiV paced, PVCs  Wt Readings from Last 3 Encounters:  01/23/21 240 lb  12.8 oz (109.2 kg)  01/22/21 237 lb (107.5 kg)  12/03/20 236 lb (107 kg)    Assessment and Plan:  1.  Chronic systolic dysfunction/ ischemic CM euvolemic today Stable on an appropriate medical regimen Normal ICD function See Pace Art report No changes today he is not device dependant today followed in ICM device clinic On last visit, VV optimization was attempted, there were no good options for multisite pacing.  P1-M4 was felt to be the best option with LV-->RV by 80 msec as best QRS.  2. VT No recent episodes Continue coreg We could consider AAD if VT becomes more frequent  3.  Paroxysmal atrial fibrillation On eliquis Afib burden is <1% by device interrogation  4. HTN Continue entresto 97/'103mg'$  BID  Risks, benefits and potential toxicities for medications prescribed and/or refilled reviewed with patient today.   Return to see EP PA in 6 months  Thompson Grayer MD, [facility] 01/23/2021 2:46 PM

## 2021-01-23 NOTE — Patient Instructions (Addendum)
Medication Instructions:  Your physician recommends that you continue on your current medications as directed. Please refer to the Current Medication list given to you today.  Labwork: None ordered.  Testing/Procedures: None ordered.  Follow-Up: Your physician wants you to follow-up in: 6 months with Thompson Grayer, MD or one of the following Advanced Practice Providers on your designated Care Team:    Legrand Como "Jonni Sanger" Chalmers Cater, [place]   You will receive a reminder letter in the mail two months in advance. If you don't receive a letter, please call our office to schedule the follow-up appointment.  Remote monitoring is used to monitor your Pacemaker of ICD from home. This monitoring reduces the number of office visits required to check your device to one time per year. It allows [place] to keep an eye on the functioning of your device to ensure it is working properly. You are scheduled for a device check from home on 01/24/21. You may send your transmission at any time that day. If you have a wireless device, the transmission will be sent automatically. After your physician reviews your transmission, you will receive a postcard with your next transmission date.  Any Other Special Instructions Will Be Listed Below (If Applicable).  If you need a refill on your cardiac medications before your next appointment, please call your pharmacy.

## 2021-01-24 ENCOUNTER — Ambulatory Visit (INDEPENDENT_AMBULATORY_CARE_PROVIDER_SITE_OTHER): Payer: Medicare Other

## 2021-01-24 DIAGNOSIS — I255 Ischemic cardiomyopathy: Secondary | ICD-10-CM

## 2021-01-24 DIAGNOSIS — I5022 Chronic systolic (congestive) heart failure: Secondary | ICD-10-CM

## 2021-01-25 LAB — CUP PACEART REMOTE DEVICE CHECK
Battery Remaining Longevity: 89 mo
Battery Remaining Percentage: 94 %
Battery Voltage: 3.04 V
Brady Statistic AP VP Percent: 97 %
Brady Statistic AP VS Percent: 1.5 %
Brady Statistic AS VP Percent: 1 %
Brady Statistic AS VS Percent: 1 %
Brady Statistic RA Percent Paced: 98 %
Date Time Interrogation Session: 20220729020048
HighPow Impedance: 42 Ohm
Implantable Lead Implant Date: 20140117
Implantable Lead Implant Date: 20140117
Implantable Lead Implant Date: 20140117
Implantable Lead Location: 753858
Implantable Lead Location: 753859
Implantable Lead Location: 753860
Implantable Pulse Generator Implant Date: 20220426
Lead Channel Impedance Value: 390 Ohm
Lead Channel Impedance Value: 400 Ohm
Lead Channel Impedance Value: 950 Ohm
Lead Channel Pacing Threshold Amplitude: 0.875 V
Lead Channel Pacing Threshold Amplitude: 0.875 V
Lead Channel Pacing Threshold Amplitude: 1.25 V
Lead Channel Pacing Threshold Pulse Width: 0.5 ms
Lead Channel Pacing Threshold Pulse Width: 0.5 ms
Lead Channel Pacing Threshold Pulse Width: 0.5 ms
Lead Channel Sensing Intrinsic Amplitude: 12 mV
Lead Channel Sensing Intrinsic Amplitude: 5 mV
Lead Channel Setting Pacing Amplitude: 1.375
Lead Channel Setting Pacing Amplitude: 1.875
Lead Channel Setting Pacing Amplitude: 2.25 V
Lead Channel Setting Pacing Pulse Width: 0.5 ms
Lead Channel Setting Pacing Pulse Width: 0.5 ms
Lead Channel Setting Sensing Sensitivity: 0.5 mV
Pulse Gen Serial Number: 810027402

## 2021-01-27 ENCOUNTER — Ambulatory Visit (INDEPENDENT_AMBULATORY_CARE_PROVIDER_SITE_OTHER): Payer: Medicare Other

## 2021-01-27 DIAGNOSIS — I5022 Chronic systolic (congestive) heart failure: Secondary | ICD-10-CM | POA: Diagnosis not present

## 2021-01-27 DIAGNOSIS — Z95810 Presence of automatic (implantable) cardiac defibrillator: Secondary | ICD-10-CM | POA: Diagnosis not present

## 2021-01-29 NOTE — Progress Notes (Signed)
EPIC Encounter for ICM Monitoring  Patient Name: Earl Bertrand Sr. is a 80 y.o. male Date: 01/29/2021 Primary Care Physican: Donella Stade, PA-C Electrophysiologist: Allred Bi-V Pacing:  98% 01/23/2021 Office Weight: 240 lbs    AT/AF Burden 0%            Spoke with patient and heart failure questions reviewed.  Pt asymptomatic for fluid accumulation and feeling well.   CorVue thoracic impedance normal.     Prescribed:   Furosemide 40 mg 1 tablet (40 mg total) daily.    Spironolactone 25 mg take 0.5 tablet (12.5 mg total) daily Eliquis 5 mg take 1 tablet twice a day.   Labs: 12/03/2020 Creatinine 1.21, BUN 18, Potassium 5.1, Sodium 142, GFR 61 10/11/2020 Creatinine 1.32, BUN 18, Potassium 5.0, Sodium 144, GFR 55 A complete set of results can be found in Results Review.   Recommendations:  No changes and encouraged to call if experiencing any fluid symptoms.   Follow-up plan: ICM clinic phone appointment on 03/04/2021.   91 day device clinic remote transmission 04/25/2021.     EP/Cardiology Office Visits: None   Copy of ICM check sent to Dr. Rayann Heman.   3 month ICM trend: 01/27/2021.    1 Year ICM trend:       Rosalene Billings, RN 01/29/2021 3:46 PM

## 2021-02-19 NOTE — Progress Notes (Signed)
Remote ICD transmission.   

## 2021-02-21 ENCOUNTER — Other Ambulatory Visit: Payer: Self-pay | Admitting: Physician Assistant

## 2021-02-21 ENCOUNTER — Other Ambulatory Visit: Payer: Self-pay | Admitting: Internal Medicine

## 2021-02-21 DIAGNOSIS — I5023 Acute on chronic systolic (congestive) heart failure: Secondary | ICD-10-CM

## 2021-02-21 DIAGNOSIS — I10 Essential (primary) hypertension: Secondary | ICD-10-CM

## 2021-02-21 DIAGNOSIS — I420 Dilated cardiomyopathy: Secondary | ICD-10-CM

## 2021-02-21 NOTE — Telephone Encounter (Signed)
Prescription refill request for Eliquis received. Indication: afib Last office visit:01/23/2021, Allred Scr: 1.32, 10/11/2020 Age: 80 yo  Weight: 109.2 kg   Refill sent.

## 2021-03-04 ENCOUNTER — Ambulatory Visit (INDEPENDENT_AMBULATORY_CARE_PROVIDER_SITE_OTHER): Payer: Medicare Other

## 2021-03-04 DIAGNOSIS — Z95810 Presence of automatic (implantable) cardiac defibrillator: Secondary | ICD-10-CM | POA: Diagnosis not present

## 2021-03-04 DIAGNOSIS — I5022 Chronic systolic (congestive) heart failure: Secondary | ICD-10-CM

## 2021-03-05 NOTE — Progress Notes (Signed)
EPIC Encounter for ICM Monitoring  Patient Name: Earl Mare Sr. is a 80 y.o. male Date: 03/05/2021 Primary Care Physican: Donella Stade, PA-C Electrophysiologist: Allred Bi-V Pacing:  96% 01/23/2021 Office Weight: 240 lbs    AT/AF Burden 0%            Spoke with patient and heart failure questions reviewed.  Pt asymptomatic for fluid accumulation and feeling well.   CorVue thoracic impedance normal.     Prescribed:   Furosemide 40 mg 1 tablet (40 mg total) daily.    Spironolactone 25 mg take 0.5 tablet (12.5 mg total) daily Eliquis 5 mg take 1 tablet twice a day.   Labs: 12/03/2020 Creatinine 1.21, BUN 18, Potassium 5.1, Sodium 142, GFR 61 10/11/2020 Creatinine 1.32, BUN 18, Potassium 5.0, Sodium 144, GFR 55 A complete set of results can be found in Results Review.   Recommendations:  No changes and encouraged to call if experiencing any fluid symptoms.   Follow-up plan: ICM clinic phone appointment on 04/07/2021.   91 day device clinic remote transmission 04/25/2021.     EP/Cardiology Office Visits: None   Copy of ICM check sent to Dr. Rayann Heman.    3 month ICM trend: 03/04/2021.    1 Year ICM trend:       Rosalene Billings, RN 03/05/2021 3:06 PM

## 2021-03-11 ENCOUNTER — Other Ambulatory Visit: Payer: Self-pay | Admitting: Physician Assistant

## 2021-03-11 DIAGNOSIS — I420 Dilated cardiomyopathy: Secondary | ICD-10-CM

## 2021-03-11 DIAGNOSIS — I10 Essential (primary) hypertension: Secondary | ICD-10-CM

## 2021-03-11 DIAGNOSIS — I5023 Acute on chronic systolic (congestive) heart failure: Secondary | ICD-10-CM

## 2021-03-17 ENCOUNTER — Ambulatory Visit: Payer: Medicare Other

## 2021-03-17 ENCOUNTER — Other Ambulatory Visit: Payer: Self-pay

## 2021-03-24 ENCOUNTER — Ambulatory Visit (INDEPENDENT_AMBULATORY_CARE_PROVIDER_SITE_OTHER): Payer: Medicare Other | Admitting: Physician Assistant

## 2021-03-24 DIAGNOSIS — Z0000 Encounter for general adult medical examination without abnormal findings: Secondary | ICD-10-CM | POA: Diagnosis not present

## 2021-03-24 NOTE — Progress Notes (Signed)
MEDICARE ANNUAL WELLNESS VISIT  03/24/2021  Telephone Visit Disclaimer This Medicare AWV was conducted by telephone due to national recommendations for restrictions regarding the COVID-19 Pandemic (e.g. social distancing).  I verified, using two identifiers, that I am speaking with Earl Hatcher Sr. or their authorized healthcare agent. I discussed the limitations, risks, security, and privacy concerns of performing an evaluation and management service by telephone and the potential availability of an in-person appointment in the future. The patient expressed understanding and agreed to proceed.  Location of Patient: Home Location of Provider (nurse):  In the office.  Subjective:    Earl Maulden Sr. is a 80 y.o. male patient of Alden Hipp, Royetta Car, PA-C who had a Medicare Annual Wellness Visit today via telephone. Earl White is Retired and lives alone. he has 1 child. he reports that he is socially active and does interact with friends/family regularly. he is moderately physically active and enjoys traveling and sports.  Patient Care Team: Lavada Mesi as PCP - General (Family Medicine) [name] Denice Bors, MD as PCP - Cardiology (Cardiology) Patsey Berthold, NP as Nurse Practitioner (Cardiology)  Advanced Directives 03/24/2021 10/22/2020 05/23/2019 05/17/2018  Does Patient Have a Medical Advance Directive? Yes Yes No Yes  Type of Advance Directive Living will;Healthcare Power of Attorney Living will - Living will  Does patient want to make changes to medical advance directive? No - Patient declined Yes (MAU/Ambulatory/Procedural Areas - Information given) - No - Patient declined  Copy of [place] in Chart? No - copy requested - - -  Would patient like information on creating a medical advance directive? - - Yes (MAU/Ambulatory/Procedural Areas - Information given) -    Hospital Utilization Over the Past 12 Months: # of hospitalizations or ER visits: 0 # of  surgeries: 0  Review of Systems    Patient reports that his overall health is unchanged compared to last year.  History obtained from chart review and the patient  Patient Reported Readings (BP, Pulse, CBG, Weight, etc) none  Pain Assessment Pain : No/denies pain     Current Medications & Allergies (verified) Allergies as of 03/24/2021   No Known Allergies      Medication List        Accurate as of March 24, 2021  9:23 AM. If you have any questions, ask your nurse or doctor.          AMBULATORY NON FORMULARY MEDICATION shingrx 2 doses to prevent shingles.   atorvastatin 80 MG tablet Commonly known as: LIPITOR Take 1 tablet by mouth once daily/ LABS FOR FURTHER REFILLS   carvedilol 25 MG tablet Commonly known as: COREG TAKE 1 TABLET BY MOUTH TWICE DAILY WITH MEALS   Eliquis 5 MG Tabs tablet Generic drug: apixaban Take 1 tablet by mouth twice daily   Entresto 97-103 MG Generic drug: sacubitril-valsartan Take 1 tablet by mouth twice daily   furosemide 40 MG tablet Commonly known as: LASIX Take 1 tablet by mouth once daily   sennosides-docusate sodium 8.6-50 MG tablet Commonly known as: SENOKOT-S Take 1 tablet by mouth daily as needed. For constipation   sildenafil 100 MG tablet Commonly known as: VIAGRA TAKE 1 TABLET BY MOUTH AS NEEDED FOR  ERECTILE  DYSFUNCTION   spironolactone 25 MG tablet Commonly known as: ALDACTONE Take 1/2 (one-half) tablet by mouth once daily   Synjardy XR 03-999 MG Tb24 Generic drug: Empagliflozin-metFORMIN HCl ER Take 1 tablet by mouth daily.   Trulicity 1.5 JX/9.1YN Sopn  Generic drug: Dulaglutide Inject 1.5 mg into the skin once a week.        History (reviewed): Past Medical History:  Diagnosis Date   BPH (benign prostatic hyperplasia)    CHF (congestive heart failure) ([place])    Diabetes mellitus ([place])    Type II. Diet controlled   Family history of breast cancer    Family history of prostate cancer     Gout    Hyperlipidemia    Hypertension    Nonischemic cardiomyopathy ([place])    a. s/p STJ CRTD   Paroxysmal atrial fibrillation ([place]) 12/25/15   Prostate cancer [facility])    Past Surgical History:  Procedure Laterality Date   BIV ICD GENERATOR CHANGEOUT N/A 10/22/2020   Procedure: BIV ICD GENERATOR CHANGEOUT;  Surgeon: Thompson Grayer, MD;  Location: [place] [facility];  Service: Cardiovascular;  Laterality: N/A;   CARDIAC DEFIBRILLATOR PLACEMENT  Jan 2014   SJM Quadra Assura implanted by Dr [name] in [place]   Family History  Problem Relation Age of Onset   Diabetes Mother    Hyperlipidemia Mother    Heart attack Brother    Breast cancer Sister 70   Prostate cancer Maternal Uncle    Prostate cancer Cousin        [name] vet, agent orange exposure   Prostate cancer Cousin        dx 28-30 yrs. - mat first cousin's son   Prostate cancer Cousin    Prostate cancer Cousin    Diabetes Brother    Throat cancer Brother 37       v[place] vet, ? due to agent orange exposure   Social History   Socioeconomic History   Marital status: Widowed    Spouse name: Not on file   Number of children: 1   Years of education: Bachelor's degree`   Highest education level: Bachelor's degree (e.g., BA, AB, BS)  Occupational History   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 He has one child and one grandson. They live close by and visit often. He enjoys sports and travelling.   Social Determinants of Health   Financial Resource Strain: Low Risk    Difficulty of Paying Living Expenses: Not hard at all  Food Insecurity: No Food Insecurity   Worried About Charity fundraiser in the Last Year:  Never true   [place] in the Last Year: Never true  Transportation Needs: No Transportation Needs   Lack of Transportation (Medical): No   Lack of Transportation (Non-Medical): No  Physical Activity: Insufficiently Active   Days of Exercise per Week: 4 days   Minutes of Exercise per Session: 30 min  Stress: No Stress Concern Present   Feeling of Stress : Not at all  Social Connections: Moderately Isolated   Frequency of Communication with Friends and Family: More than three times a week   Frequency of Social Gatherings with Friends and Family: Twice a week   Attends Religious Services: More than 4 times per year   Active Member of [employer] or Organizations: No   Attends Club or  Organization Meetings: Never   Marital Status: Widowed    Activities of Daily Living In your present state of health, do you have any difficulty performing the following activities: 03/24/2021  Hearing? N  Vision? N  Difficulty concentrating or making decisions? N  Walking or climbing stairs? N  Comment climbing stairs. he takes his time but is able to do it.  Dressing or bathing? N  Doing errands, shopping? N  Preparing Food and eating ? N  Using the Toilet? N  In the past six months, have you accidently leaked urine? N  Do you have problems with loss of bowel control? N  Managing your Medications? N  Managing your Finances? N  Housekeeping or managing your Housekeeping? N  Some recent data might be hidden    Patient Education/ Literacy How often do you need to have someone help you when you read instructions, pamphlets, or other written materials from your doctor or pharmacy?: 1 - Never What is the last grade level you completed in school?: Bachelor's degree  Exercise Current Exercise Habits: Home exercise routine, Type of exercise: walking;strength training/weights, Time (Minutes): 30, Frequency (Times/Week): 4, Weekly Exercise (Minutes/Week): 120, Intensity: Moderate, Exercise limited by: None  identified  Diet Patient reports consuming 3 meals a day and 2 snack(s) a day Patient reports that his primary diet is: Regular Patient reports that he does not have regular access to food.   Depression Screen PHQ 2/9 Scores 03/24/2021 12/11/2019 05/16/2019 05/17/2018 01/31/2018 03/05/2017  PHQ - 2 Score 0 0 1 0 0 0  PHQ- 9 Score - 2 3 - - -     Fall Risk Fall Risk  03/24/2021 12/11/2019 05/24/2019 05/23/2019 05/16/2019  Falls in the past year? 0 1 0 0 0  Comment - - Emmi Telephone Survey: data to providers prior to load - -  Number falls in past yr: 0 0 - 0 0  Injury with Fall? 0 1 - 0 0  Risk for fall due to : No Fall Risks History of fall(s) - - -  Follow up Falls evaluation completed Falls evaluation completed - Falls prevention discussed Falls evaluation completed     Objective:  Earl Hatcher Sr. seemed alert and oriented and he participated appropriately during our telephone visit.  Blood Pressure Weight BMI  BP Readings from Last 3 Encounters:  01/23/21 124/76  12/03/20 120/60  10/23/20 121/64   Wt Readings from Last 3 Encounters:  01/23/21 240 lb 12.8 oz (109.2 kg)  01/22/21 237 lb (107.5 kg)  12/03/20 236 lb (107 kg)   BMI Readings from Last 1 Encounters:  01/23/21 34.55 kg/m    *Unable to obtain current vital signs, weight, and BMI due to telephone visit type  Hearing/Vision  Truddie Crumble did not seem to have difficulty with hearing/understanding during the telephone conversation Reports that he has had a formal eye exam by an eye care professional within the past year Reports that he has not had a formal hearing evaluation within the past year *Unable to fully assess hearing and vision during telephone visit type  Cognitive Function: 6CIT Screen 03/24/2021 05/23/2019 05/17/2018  What Year? 0 points 0 points 0 points  What month? 0 points 0 points 0 points  What time? 0 points 0 points 0 points  Count back from 20 0 points - 0 points  Months in reverse 0 points - 0  points  Repeat phrase 2 points - 2 points  Total Score 2 - 2   (Normal:0-7, Significant for  Dysfunction: >8)  Normal Cognitive Function Screening: Yes   Immunization & Health Maintenance Record Immunization History  Administered Date(s) Administered   Fluad Quad(high Dose 65+) 05/16/2019, 04/26/2020   Moderna SARS-COV2 Booster Vaccination 01/20/2021   Moderna Sars-Covid-2 Vaccination 08/26/2019, 09/23/2019, 04/25/2020   Pneumococcal Conjugate-13 08/07/2015   Pneumococcal Polysaccharide-23 08/26/2018   Tdap 11/02/2014    Health Maintenance  Topic Date Due   Zoster Vaccines- Shingrix (1 of 2) 04/24/2021 (Originally 10/28/1959)   INFLUENZA VACCINE  09/26/2021 (Originally 01/27/2021)   COVID-19 Vaccine (5 - Booster for Moderna series) 05/23/2021   OPHTHALMOLOGY EXAM  06/12/2021   HEMOGLOBIN A1C  07/25/2021   FOOT EXAM  01/22/2022   TETANUS/TDAP  11/01/2024   HPV VACCINES  Aged Out       Assessment  This is a routine wellness examination for Earl WhiteMarland Kitchen  Health Maintenance: Due or Overdue There are no preventive care reminders to display for this patient.   Earl Hatcher Sr. does not need a referral for Community Assistance: Care Management:   no Social Work:    no Prescription Assistance:  no Nutrition/Diabetes Education:  no   Plan:  Personalized Goals  Goals Addressed               This Visit's Progress     Patient Stated (pt-stated)        03/24/2021 AWV Goal: Exercise for General Health  Patient will verbalize understanding of the benefits of increased physical activity: Exercising regularly is important. It will improve your overall fitness, flexibility, and endurance. Regular exercise also will improve your overall health. It can help you control your weight, reduce stress, and improve your bone density. Over the next year, patient will increase physical activity as tolerated with a goal of at least 150 minutes of moderate physical activity per  week.  You can tell that you are exercising at a moderate intensity if your heart starts beating faster and you start breathing faster but can still hold a conversation. Moderate-intensity exercise ideas include: Walking 1 mile (1.6 km) in about 15 minutes Biking Hiking Golfing Dancing Water aerobics Patient will verbalize understanding of everyday activities that increase physical activity by providing examples like the following: Yard work, such as: Sales promotion account executive Gardening Washing windows or floors Patient will be able to explain general safety guidelines for exercising:  Before you start a new exercise program, talk with your health care provider. Do not exercise so much that you hurt yourself, feel dizzy, or get very short of breath. Wear comfortable clothes and wear shoes with good support. Drink plenty of water while you exercise to prevent dehydration or heat stroke. Work out until your breathing and your heartbeat get faster.        Personalized Health Maintenance & Screening Recommendations  Influenza vaccine Shingrix  Lung Cancer Screening Recommended: no (Low Dose CT Chest recommended if Age 43-80 years, 30 pack-year currently smoking OR have quit w/in past 15 years) Hepatitis C Screening recommended: no HIV Screening recommended: no  Advanced Directives: Written information was not prepared per patient's request.  Referrals & Orders No orders of the defined types were placed in this encounter.   Follow-up Plan Follow-up with Donella Stade, PA-C as planned Schedule your nurse visit for your flu shot. Shingrix vaccine at the pharmacy.  Medicare wellness visit in one year. AVS printed and mailed to the patient.   I have personally  reviewed and noted the following in the patient's chart:   Medical and social history Use of alcohol, tobacco or illicit drugs  Current  medications and supplements Functional ability and status Nutritional status Physical activity Advanced directives List of other physicians Hospitalizations, surgeries, and ER visits in previous 12 months Vitals Screenings to include cognitive, depression, and falls Referrals and appointments  In addition, I have reviewed and discussed with Earl Hatcher Sr. certain preventive protocols, quality metrics, and best practice recommendations. A written personalized care plan for preventive services as well as general preventive health recommendations is available and can be mailed to the patient at his request.      Tinnie Gens, RN  03/24/2021

## 2021-03-24 NOTE — Patient Instructions (Addendum)
[place] Maintenance Summary and Written Plan of Care  [name] ,  Thank you for allowing me to perform your Medicare Annual Wellness Visit and for your ongoing commitment to your health.   Health Maintenance & Immunization History Health Maintenance  Topic Date Due   Zoster Vaccines- Shingrix (1 of 2) 04/24/2021 (Originally 10/28/1959)   INFLUENZA VACCINE  09/26/2021 (Originally 01/27/2021)   COVID-19 Vaccine (5 - Booster for Moderna series) 05/23/2021   OPHTHALMOLOGY EXAM  06/12/2021   HEMOGLOBIN A1C  07/25/2021   FOOT EXAM  01/22/2022   TETANUS/TDAP  11/01/2024   HPV VACCINES  Aged Out   Immunization History  Administered Date(s) Administered   Fluad Quad(high Dose 65+) 05/16/2019, 04/26/2020   Moderna SARS-COV2 Booster Vaccination 01/20/2021   Moderna Sars-Covid-2 Vaccination 08/26/2019, 09/23/2019, 04/25/2020   Pneumococcal Conjugate-13 08/07/2015   Pneumococcal Polysaccharide-23 08/26/2018   Tdap 11/02/2014    These are the patient goals that we discussed:  Goals Addressed               This Visit's Progress     Patient Stated (pt-stated)        03/24/2021 AWV Goal: Exercise for General Health  Patient will verbalize understanding of the benefits of increased physical activity: Exercising regularly is important. It will improve your overall fitness, flexibility, and endurance. Regular exercise also will improve your overall health. It can help you control your weight, reduce stress, and improve your bone density. Over the next year, patient will increase physical activity as tolerated with a goal of at least 150 minutes of moderate physical activity per week.  You can tell that you are exercising at a moderate intensity if your heart starts beating faster and you start breathing faster but can still hold a conversation. Moderate-intensity exercise ideas include: Walking 1 mile (1.6 km) in about 15  minutes Biking Hiking Golfing Dancing Water aerobics Patient will verbalize understanding of everyday activities that increase physical activity by providing examples like the following: Yard work, such as: Sales promotion account executive Gardening Washing windows or floors Patient will be able to explain general safety guidelines for exercising:  Before you start a new exercise program, talk with your health care provider. Do not exercise so much that you hurt yourself, feel dizzy, or get very short of breath. Wear comfortable clothes and wear shoes with good support. Drink plenty of water while you exercise to prevent dehydration or heat stroke. Work out until your breathing and your heartbeat get faster.          This is a list of Health Maintenance Items that are overdue or due now: Influenza vaccine Shingrix  Orders/Referrals Placed Today: No orders of the defined types were placed in this encounter.  (Contact our referral department at [phone] if you have not spoken with someone about your referral appointment within the next 5 days)    Follow-up Plan Follow-up with Donella Stade, PA-C as planned Schedule your nurse visit for your flu shot. Shingrix vaccine at the pharmacy.  Medicare wellness visit in one year. AVS printed and mailed to the patient.     Health Maintenance, Male Adopting a healthy lifestyle and getting preventive care are important in promoting health and wellness. Ask your health care provider about: The right schedule for you to have regular tests and exams. Things you can do on your own to prevent diseases and keep yourself  healthy. What should I know about diet, weight, and exercise? Eat a healthy diet  Eat a diet that includes plenty of vegetables, fruits, low-fat dairy products, and lean protein. Do not eat a lot of foods that are high in solid fats, added sugars,  or sodium. Maintain a healthy weight Body mass index (BMI) is a measurement that can be used to identify possible weight problems. It estimates body fat based on height and weight. Your health care provider can help determine your BMI and help you achieve or maintain a healthy weight. Get regular exercise Get regular exercise. This is one of the most important things you can do for your health. Most adults should: Exercise for at least 150 minutes each week. The exercise should increase your heart rate and make you sweat (moderate-intensity exercise). Do strengthening exercises at least twice a week. This is in addition to the moderate-intensity exercise. Spend less time sitting. Even light physical activity can be beneficial. Watch cholesterol and blood lipids Have your blood tested for lipids and cholesterol at 80 years of age, then have this test every 5 years. You may need to have your cholesterol levels checked more often if: Your lipid or cholesterol levels are high. You are older than 80 years of age. You are at high risk for heart disease. What should I know about cancer screening? Many types of cancers can be detected early and may often be prevented. Depending on your health history and family history, you may need to have cancer screening at various ages. This may include screening for: Colorectal cancer. Prostate cancer. Skin cancer. Lung cancer. What should I know about heart disease, diabetes, and high blood pressure? Blood pressure and heart disease High blood pressure causes heart disease and increases the risk of stroke. This is more likely to develop in people who have high blood pressure readings, are of African descent, or are overweight. Talk with your health care provider about your target blood pressure readings. Have your blood pressure checked: Every 3-5 years if you are 54-32 years of age. Every year if you are 22 years old or older. If you are between the ages of  86 and 2 and are a current or former smoker, ask your health care provider if you should have a one-time screening for abdominal aortic aneurysm (AAA). Diabetes Have regular diabetes screenings. This checks your fasting blood sugar level. Have the screening done: Once every three years after age 73 if you are at a normal weight and have a low risk for diabetes. More often and at a younger age if you are overweight or have a high risk for diabetes. What should I know about preventing infection? Hepatitis B If you have a higher risk for hepatitis B, you should be screened for this virus. Talk with your health care provider to find out if you are at risk for hepatitis B infection. Hepatitis C Blood testing is recommended for: Everyone born from [birth year] through 1965. Anyone with known risk factors for hepatitis C. Sexually transmitted infections (STIs) You should be screened each year for STIs, including gonorrhea and chlamydia, if: You are sexually active and are younger than 80 years of age. You are older than 80 years of age and your health care provider tells you that you are at risk for this type of infection. Your sexual activity has changed since you were last screened, and you are at increased risk for chlamydia or gonorrhea. Ask your health care provider if you are  at risk. Ask your health care provider about whether you are at high risk for HIV. Your health care provider may recommend a prescription medicine to help prevent HIV infection. If you choose to take medicine to prevent HIV, you should first get tested for HIV. You should then be tested every 3 months for as long as you are taking the medicine. Follow these instructions at home: Lifestyle Do not use any products that contain nicotine or tobacco, such as cigarettes, e-cigarettes, and chewing tobacco. If you need help quitting, ask your health care provider. Do not use street drugs. Do not share needles. Ask your health care  provider for help if you need support or information about quitting drugs. Alcohol use Do not drink alcohol if your health care provider tells you not to drink. If you drink alcohol: Limit how much you have to 0-2 drinks a day. Be aware of how much alcohol is in your drink. In the [place] one drink equals one 12 oz bottle of beer (355 mL), one 5 oz glass of wine (148 mL), or one 1 oz glass of hard liquor (44 mL). General instructions Schedule regular health, dental, and eye exams. Stay current with your vaccines. Tell your health care provider if: You often feel depressed. You have ever been abused or do not feel safe at home. Summary Adopting a healthy lifestyle and getting preventive care are important in promoting health and wellness. Follow your health care provider's instructions about healthy diet, exercising, and getting tested or screened for diseases. Follow your health care provider's instructions on monitoring your cholesterol and blood pressure. This information is not intended to replace advice given to you by your health care provider. Make sure you discuss any questions you have with your health care provider. Document Revised: 08/23/2020 Document Reviewed: 06/08/2018 Elsevier Patient Education  2022 [employer].

## 2021-03-26 ENCOUNTER — Other Ambulatory Visit: Payer: Self-pay | Admitting: Cardiology

## 2021-03-26 DIAGNOSIS — I5023 Acute on chronic systolic (congestive) heart failure: Secondary | ICD-10-CM

## 2021-03-26 DIAGNOSIS — I420 Dilated cardiomyopathy: Secondary | ICD-10-CM

## 2021-03-28 MED ORDER — SPIRONOLACTONE 25 MG PO TABS: ORAL_TABLET | ORAL | 3 refills | Status: DC

## 2021-03-28 NOTE — Addendum Note (Signed)
Addended by: Orma Render on: 03/28/2021 04:13 PM   Modules accepted: Orders

## 2021-04-07 ENCOUNTER — Ambulatory Visit (INDEPENDENT_AMBULATORY_CARE_PROVIDER_SITE_OTHER): Payer: Medicare Other

## 2021-04-07 ENCOUNTER — Telehealth: Payer: Self-pay

## 2021-04-07 DIAGNOSIS — Z95810 Presence of automatic (implantable) cardiac defibrillator: Secondary | ICD-10-CM

## 2021-04-07 DIAGNOSIS — I5022 Chronic systolic (congestive) heart failure: Secondary | ICD-10-CM | POA: Diagnosis not present

## 2021-04-07 NOTE — Progress Notes (Signed)
EPIC Encounter for ICM Monitoring  Patient Name: Earl Kester Sr. is a 80 y.o. male Date: 04/07/2021 Primary Care Physican: Lavada Mesi Primary Cardiologist: [name] Electrophysiologist: Allred Bi-V Pacing:  96% 01/23/2021 Office Weight: 240 lbs    AT/AF Burden 0%            Attempted call to patient and unable to reach.  Transmission reviewed.    CorVue thoracic impedance suggesting possible fluid accumulation starting 10/7 and trending back toward baseline.     Prescribed:   Furosemide 40 mg 1 tablet (40 mg total) daily.    Spironolactone 25 mg take 0.5 tablet (12.5 mg total) daily Eliquis 5 mg take 1 tablet twice a day.   Labs: 12/03/2020 Creatinine 1.21, BUN 18, Potassium 5.1, Sodium 142, GFR 61 10/11/2020 Creatinine 1.32, BUN 18, Potassium 5.0, Sodium 144, GFR 55 A complete set of results can be found in Results Review.   Recommendations:  Unable to reach.     Follow-up plan: ICM clinic phone appointment on 04/15/2021 to recheck fluid levels.   91 day device clinic remote transmission 04/25/2021.     EP/Cardiology Office Visits:  Recall 08/05/2021 with Oda Kilts, PA.   Copy of ICM check sent to Dr. Rayann Heman. Will send to Dr [name] for review if patient is reached.   3 month ICM trend: 04/07/2021.    1 Year ICM trend:       Rosalene Billings, RN 04/07/2021 12:35 PM

## 2021-04-07 NOTE — Telephone Encounter (Signed)
Remote ICM transmission received.  Attempted call to patient regarding ICM remote transmission and no answer or voice mail option.  

## 2021-04-15 ENCOUNTER — Ambulatory Visit (INDEPENDENT_AMBULATORY_CARE_PROVIDER_SITE_OTHER): Payer: Medicare Other

## 2021-04-15 ENCOUNTER — Other Ambulatory Visit: Payer: Self-pay | Admitting: Physician Assistant

## 2021-04-15 DIAGNOSIS — E782 Mixed hyperlipidemia: Secondary | ICD-10-CM

## 2021-04-15 DIAGNOSIS — I5022 Chronic systolic (congestive) heart failure: Secondary | ICD-10-CM

## 2021-04-15 DIAGNOSIS — Z95810 Presence of automatic (implantable) cardiac defibrillator: Secondary | ICD-10-CM

## 2021-04-18 NOTE — Progress Notes (Signed)
EPIC Encounter for ICM Monitoring  Patient Name: Earl Tener Sr. is a 80 y.o. male Date: 04/18/2021 Primary Care Physican: Lavada Mesi Primary Cardiologist: [name] Electrophysiologist: Allred Bi-V Pacing:  96% 01/23/2021 Office Weight: 240 lbs    AT/AF Burden 0%            Transmission reviewed.    CorVue thoracic impedance suggesting possible fluid accumulation starting 10/7 and trending back toward baseline.     Prescribed:   Furosemide 40 mg 1 tablet (40 mg total) daily.    Spironolactone 25 mg take 0.5 tablet (12.5 mg total) daily Eliquis 5 mg take 1 tablet twice a day.   Labs: 12/03/2020 Creatinine 1.21, BUN 18, Potassium 5.1, Sodium 142, GFR 61 10/11/2020 Creatinine 1.32, BUN 18, Potassium 5.0, Sodium 144, GFR 55 A complete set of results can be found in Results Review.   Recommendations:  No changes   Follow-up plan: ICM clinic phone appointment on 05/12/2021.   91 day device clinic remote transmission 04/25/2021.     EP/Cardiology Office Visits:  Recall 08/05/2021 with Oda Kilts, PA.   Copy of ICM check sent to Dr. Rayann Heman.   3 month ICM trend: 04/15/2021.    1 Year ICM trend:       Rosalene Billings, RN 04/18/2021 2:06 PM

## 2021-04-24 ENCOUNTER — Encounter: Payer: Self-pay | Admitting: Physician Assistant

## 2021-04-24 ENCOUNTER — Ambulatory Visit (INDEPENDENT_AMBULATORY_CARE_PROVIDER_SITE_OTHER): Payer: Medicare Other | Admitting: Physician Assistant

## 2021-04-24 ENCOUNTER — Other Ambulatory Visit: Payer: Self-pay

## 2021-04-24 ENCOUNTER — Ambulatory Visit (INDEPENDENT_AMBULATORY_CARE_PROVIDER_SITE_OTHER): Payer: Medicare Other

## 2021-04-24 VITALS — BP 112/50 | HR 59 | Ht 70.0 in | Wt 245.0 lb

## 2021-04-24 DIAGNOSIS — Z23 Encounter for immunization: Secondary | ICD-10-CM

## 2021-04-24 DIAGNOSIS — R0602 Shortness of breath: Secondary | ICD-10-CM | POA: Diagnosis not present

## 2021-04-24 DIAGNOSIS — E1122 Type 2 diabetes mellitus with diabetic chronic kidney disease: Secondary | ICD-10-CM

## 2021-04-24 DIAGNOSIS — I255 Ischemic cardiomyopathy: Secondary | ICD-10-CM

## 2021-04-24 DIAGNOSIS — I517 Cardiomegaly: Secondary | ICD-10-CM | POA: Diagnosis not present

## 2021-04-24 DIAGNOSIS — I420 Dilated cardiomyopathy: Secondary | ICD-10-CM

## 2021-04-24 LAB — POCT GLYCOSYLATED HEMOGLOBIN (HGB A1C): Hemoglobin A1C: 6.3 % — AB (ref 4.0–5.6)

## 2021-04-24 NOTE — Progress Notes (Signed)
Subjective:    Patient ID: Earl Hatcher Sr., male    DOB: 1940-12-10, 80 y.o.   MRN: [id number]  HPI Pt is a 80 yo obese male with CHF, T2DM, HTN, PAF, HLD and has a ICD who presents to the clinic for 3 month follow up.   He is not checking his sugars. He is compliant with all medications. He has been a little more SOB for last 10 days and a little more swollen. Deneis any cough, ST, URI symptoms, headaches, fever, body aches. Not taking anything to make better. Denies any hypoglycemic events. Denies any CP or palpitations.   He has had no ICD activations.    .. Active Ambulatory Problems    Diagnosis Date Noted   Essential hypertension, benign 04/28/2013   BPH (benign prostatic hyperplasia) 04/28/2013   Gout 04/28/2013   Congestive dilated cardiomyopathy ([place]) 05/10/2013   ICD (implantable cardioverter-defibrillator) in place 05/10/2013   Elevated PSA 04/04/1218   Systolic dysfunction with acute on chronic heart failure ([place]) 09/03/2014   Morbid obesity ([place]) 09/03/2014   Erectile dysfunction 03/10/2015   Lower extremity edema 09/29/2017   Abnormal ultrasound of lower extremity 09/29/2017   Malignant neoplasm of prostate ([place]) 02/03/2018   Family history of cancer 02/03/2018   Family history of prostate cancer    Family history of breast cancer    Genetic testing 03/25/2018   Hypotension 05/23/2018   Paroxysmal atrial fibrillation ([place]) 12/25/2015   History of gout 08/01/2018   Type 2 diabetes mellitus with chronic kidney disease, 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[place]) 08/01/2018   Acute left-sided low back pain without sciatica 05/16/2019   Syncope and collapse 75/88/3254   Chronic systolic heart failure ([place]) 06/08/2019   Ventricular fibrillation ([place]) 08/16/2019   Fall 08/16/2019   Impetigo 08/18/2019   Rectal bleeding 12/11/2019   Hyperlipidemia LDL goal <70 12/11/2019   Resolved Ambulatory Problems    Diagnosis Date Noted   Diabetes mellitus due to  underlying condition, controlled, without complication, 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[place]) 04/28/2013   Past Medical History:  Diagnosis Date   CHF (congestive heart failure) ([place])    Diabetes mellitus ([place])    Hyperlipidemia    Hypertension    Nonischemic cardiomyopathy ([place])    Prostate cancer ([place])      Review of Systems    See HPI.  Objective:   Physical Exam Vitals reviewed.  Constitutional:      Appearance: Normal appearance. He is obese.  HENT:     Head: Normocephalic.  Cardiovascular:     Rate and Rhythm: Normal rate and regular rhythm.     Pulses: Normal pulses.     Heart sounds: No murmur heard. Pulmonary:     Effort: Pulmonary effort is normal.     Breath sounds: Normal breath sounds.  Musculoskeletal:     Right lower leg: Edema present.     Left lower leg: Edema present.     Comments: Scant 1+ edema of bilateral lower extremities.   Neurological:     General: No focal deficit present.     Mental Status: He is alert and oriented to person, place, and time.  Psychiatric:        Mood and Affect: Mood normal.      .. Results for orders placed or performed in visit on 04/24/21  CBC  Result Value Ref Range   WBC 5.9 3.8 - 10.8 Thousand/uL   RBC 5.15 4.20 - 5.80 Million/uL  Hemoglobin 15.6 13.2 - 17.1 g/dL   HCT 48.2 38.5 - 50.0 %   MCV 93.6 80.0 - 100.0 fL   MCH 30.3 27.0 - 33.0 pg   MCHC 32.4 32.0 - 36.0 g/dL   RDW 14.1 11.0 - 15.0 %   Platelets 231 140 - 400 Thousand/uL   MPV 11.3 7.5 - 12.5 fL  COMPLETE METABOLIC PANEL WITH GFR  Result Value Ref Range   Glucose, Bld 97 65 - 99 mg/dL   BUN 21 7 - 25 mg/dL   Creat 1.32 (H) 0.70 - 1.22 mg/dL   eGFR 55 (L) > OR = 60 mL/min/1.36m2   BUN/Creatinine Ratio 16 6 - 22 (calc)   Sodium 143 135 - 146 mmol/L   Potassium 4.6 3.5 - 5.3 mmol/L   Chloride 107 98 - 110 mmol/L   CO2 24 20 - 32 mmol/L   Calcium 9.8 8.6 - 10.3 mg/dL   Total Protein 6.8 6.1 - 8.1 g/dL   Albumin 4.2 3.6 - 5.1 g/dL    Globulin 2.6 1.9 - 3.7 g/dL (calc)   AG Ratio 1.6 1.0 - 2.5 (calc)   Total Bilirubin 0.7 0.2 - 1.2 mg/dL   Alkaline phosphatase (APISO) 76 35 - 144 U/L   AST 24 10 - 35 U/L   ALT 24 9 - 46 U/L  Fe+TIBC+Fer  Result Value Ref Range   Iron 58 50 - 180 mcg/dL   TIBC 277 250 - 425 mcg/dL (calc)   %SAT 21 20 - 48 % (calc)   Ferritin 56 24 - 380 ng/mL  Brain natriuretic peptide  Result Value Ref Range   Brain Natriuretic Peptide 123 (H) <100 pg/mL  POCT glycosylated hemoglobin (Hb A1C)  Result Value Ref Range   Hemoglobin A1C 6.3 (A) 4.0 - 5.6 %   HbA1c POC (<> result, manual entry)     HbA1c, POC (prediabetic range)     HbA1c, POC (controlled diabetic range)         Assessment & Plan:  Earl KitchenMarland White was seen today for diabetes.  Diagnoses and all orders for this visit:  SOB (shortness of breath) -     CBC -     COMPLETE METABOLIC PANEL WITH GFR -     Fe+TIBC+Fer -     DG Chest 2 View; Future -     Brain natriuretic peptide  Type 2 diabetes mellitus with chronic kidney disease, without long-term current use of insulin, unspecified CKD stage (HCC) -     POCT glycosylated hemoglobin (Hb A1C)  Flu vaccine need -     Flu Vaccine QUAD High Dose(Fluad)  Congestive dilated cardiomyopathy (HCC) -     DG Chest 2 View; Future -     Brain natriuretic peptide  Other orders -     Brain natriuretic peptide  A1c looks great today.  Continue same medications.  On statin.  BP low today. On BB, lasix, ARB, spirolactone.  Covid x3. Needs last booster.  Flu shot given today.   Concerned with a acute flare of CHF. Would increase diuretic but BP so low. Will get BNP to confirm some fluid overload.  Will get CXR to look for any other causes of SOB. No signs of infection.

## 2021-04-25 ENCOUNTER — Ambulatory Visit: Payer: Medicare Other | Admitting: Physician Assistant

## 2021-04-25 ENCOUNTER — Ambulatory Visit (INDEPENDENT_AMBULATORY_CARE_PROVIDER_SITE_OTHER): Payer: Medicare Other

## 2021-04-25 DIAGNOSIS — I255 Ischemic cardiomyopathy: Secondary | ICD-10-CM

## 2021-04-25 LAB — CUP PACEART REMOTE DEVICE CHECK
Battery Remaining Longevity: 88 mo
Battery Remaining Percentage: 92 %
Battery Voltage: 3.01 V
Brady Statistic AP VP Percent: 96 %
Brady Statistic AP VS Percent: 2.8 %
Brady Statistic AS VP Percent: 1 %
Brady Statistic AS VS Percent: 1 %
Brady Statistic RA Percent Paced: 97 %
Date Time Interrogation Session: 20221028020057
HighPow Impedance: 45 Ohm
Implantable Lead Implant Date: 20140117
Implantable Lead Implant Date: 20140117
Implantable Lead Implant Date: 20140117
Implantable Lead Location: 753858
Implantable Lead Location: 753859
Implantable Lead Location: 753860
Implantable Pulse Generator Implant Date: 20220426
Lead Channel Impedance Value: 410 Ohm
Lead Channel Impedance Value: 440 Ohm
Lead Channel Impedance Value: 940 Ohm
Lead Channel Pacing Threshold Amplitude: 0.75 V
Lead Channel Pacing Threshold Amplitude: 0.875 V
Lead Channel Pacing Threshold Amplitude: 1.25 V
Lead Channel Pacing Threshold Pulse Width: 0.5 ms
Lead Channel Pacing Threshold Pulse Width: 0.5 ms
Lead Channel Pacing Threshold Pulse Width: 0.5 ms
Lead Channel Sensing Intrinsic Amplitude: 12 mV
Lead Channel Sensing Intrinsic Amplitude: 5 mV
Lead Channel Setting Pacing Amplitude: 1.375
Lead Channel Setting Pacing Amplitude: 1.75 V
Lead Channel Setting Pacing Amplitude: 2.25 V
Lead Channel Setting Pacing Pulse Width: 0.5 ms
Lead Channel Setting Pacing Pulse Width: 0.5 ms
Lead Channel Setting Sensing Sensitivity: 0.5 mV
Pulse Gen Serial Number: 810027402

## 2021-04-25 LAB — IRON,TIBC AND FERRITIN PANEL
%SAT: 21 % (calc) (ref 20–48)
Ferritin: 56 ng/mL (ref 24–380)
Iron: 58 ug/dL (ref 50–180)
TIBC: 277 mcg/dL (calc) (ref 250–425)

## 2021-04-25 LAB — CBC
HCT: 48.2 % (ref 38.5–50.0)
Hemoglobin: 15.6 g/dL (ref 13.2–17.1)
MCH: 30.3 pg (ref 27.0–33.0)
MCHC: 32.4 g/dL (ref 32.0–36.0)
MCV: 93.6 fL (ref 80.0–100.0)
MPV: 11.3 fL (ref 7.5–12.5)
Platelets: 231 10*3/uL (ref 140–400)
RBC: 5.15 10*6/uL (ref 4.20–5.80)
RDW: 14.1 % (ref 11.0–15.0)
WBC: 5.9 10*3/uL (ref 3.8–10.8)

## 2021-04-25 LAB — COMPLETE METABOLIC PANEL WITH GFR
AG Ratio: 1.6 (calc) (ref 1.0–2.5)
ALT: 24 U/L (ref 9–46)
AST: 24 U/L (ref 10–35)
Albumin: 4.2 g/dL (ref 3.6–5.1)
Alkaline phosphatase (APISO): 76 U/L (ref 35–144)
BUN/Creatinine Ratio: 16 (calc) (ref 6–22)
BUN: 21 mg/dL (ref 7–25)
CO2: 24 mmol/L (ref 20–32)
Calcium: 9.8 mg/dL (ref 8.6–10.3)
Chloride: 107 mmol/L (ref 98–110)
Creat: 1.32 mg/dL — ABNORMAL HIGH (ref 0.70–1.22)
Globulin: 2.6 g/dL (calc) (ref 1.9–3.7)
Glucose, Bld: 97 mg/dL (ref 65–99)
Potassium: 4.6 mmol/L (ref 3.5–5.3)
Sodium: 143 mmol/L (ref 135–146)
Total Bilirubin: 0.7 mg/dL (ref 0.2–1.2)
Total Protein: 6.8 g/dL (ref 6.1–8.1)
eGFR: 55 mL/min/{1.73_m2} — ABNORMAL LOW (ref >=60)

## 2021-04-25 LAB — BRAIN NATRIURETIC PEPTIDE: Brain Natriuretic Peptide: 123 pg/mL — ABNORMAL HIGH (ref <100)

## 2021-04-25 NOTE — Progress Notes (Signed)
No acute findings with CXR. Heart enlarged known and stable with some congestion.

## 2021-04-28 NOTE — Progress Notes (Signed)
BNP is up some we do need him to increase the spirolactone to full tablet but concerned about BP drop. What are your BP readings at home?

## 2021-05-02 ENCOUNTER — Ambulatory Visit (INDEPENDENT_AMBULATORY_CARE_PROVIDER_SITE_OTHER): Payer: Medicare Other | Admitting: Physician Assistant

## 2021-05-02 ENCOUNTER — Encounter: Payer: Self-pay | Admitting: Physician Assistant

## 2021-05-02 VITALS — BP 127/61 | HR 63 | Ht 70.0 in | Wt 243.0 lb

## 2021-05-02 DIAGNOSIS — I5023 Acute on chronic systolic (congestive) heart failure: Secondary | ICD-10-CM | POA: Diagnosis not present

## 2021-05-02 DIAGNOSIS — I420 Dilated cardiomyopathy: Secondary | ICD-10-CM | POA: Diagnosis not present

## 2021-05-02 DIAGNOSIS — R0602 Shortness of breath: Secondary | ICD-10-CM

## 2021-05-02 DIAGNOSIS — I255 Ischemic cardiomyopathy: Secondary | ICD-10-CM | POA: Diagnosis not present

## 2021-05-02 MED ORDER — SPIRONOLACTONE 25 MG PO TABS: ORAL_TABLET | ORAL | 1 refills | Status: DC

## 2021-05-02 NOTE — Progress Notes (Signed)
Subjective:    Patient ID: Earl Hatcher Sr., male    DOB: 10/14/1940, 80 y.o.   MRN: [id number]  HPI Pt is a 80 yo obese male with CHF, cardiomyopathy, PAF with ICD who presents to the clinic to follow up on SOB.   He does feel better after increasing spirolactone to 25mg  daily. Denies dizziness or BP dropping. Lost 2lbs. Pulse ox at 93 percent. Lower ext edema about the same. No cough and never smoked. CXR showed vascular congestion. No fever, chills, body aches, headache, URI symptoms.   .. Active Ambulatory Problems    Diagnosis Date Noted   Essential hypertension, benign 04/28/2013   BPH (benign prostatic hyperplasia) 04/28/2013   Gout 04/28/2013   Congestive dilated cardiomyopathy ([place]) 05/10/2013   ICD (implantable cardioverter-defibrillator) in place 05/10/2013   Elevated PSA 66/29/4765   Systolic dysfunction with acute on chronic heart failure ([place]) 09/03/2014   Morbid obesity ([place]) 09/03/2014   Erectile dysfunction 03/10/2015   Lower extremity edema 09/29/2017   Abnormal ultrasound of lower extremity 09/29/2017   Malignant neoplasm of prostate ([place]) 02/03/2018   Family history of cancer 02/03/2018   Family history of prostate cancer    Family history of breast cancer    Genetic testing 03/25/2018   Hypotension 05/23/2018   Paroxysmal atrial fibrillation ([place]) 12/25/2015   History of gout 08/01/2018   Type 2 diabetes mellitus with chronic kidney disease, 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) 06/08/2019   Ventricular fibrillation ([place]) 08/16/2019   Fall 08/16/2019   Impetigo 08/18/2019   Rectal bleeding 12/11/2019   Hyperlipidemia LDL goal <70 12/11/2019   Resolved Ambulatory Problems    Diagnosis Date Noted   Diabetes mellitus due to underlying condition, controlled, without complication, without long-term  current use of insulin ([place]) 04/28/2013   Past Medical History:  Diagnosis Date   CHF (congestive heart failure) ([place])    Diabetes mellitus ([place])    Hyperlipidemia    Hypertension    Nonischemic cardiomyopathy ([place])    Prostate cancer ([place])        Review of Systems See HPI.     Objective:   Physical Exam Vitals reviewed.  Constitutional:      Appearance: Normal appearance.  HENT:     Head: Normocephalic.  Neck:     Vascular: No carotid bruit.  Cardiovascular:     Rate and Rhythm: Normal rate and regular rhythm.     Heart sounds: Murmur heard.  Pulmonary:     Effort: Pulmonary effort is normal.     Breath sounds: Normal breath sounds.  Musculoskeletal:     Right lower leg: Edema present.     Left lower leg: Edema present.     Comments: 1+ pitting edema bilateral legs.   Neurological:     General: No focal deficit present.     Mental Status: He is alert and oriented to person, place, and time.  Psychiatric:        Mood and Affect: Mood normal.          Assessment & Plan:  [name] KitchenMarland KitchenTauno was seen today for follow-up.  Diagnoses and all orders for this visit:  SOB (shortness of breath) -     Brain natriuretic peptide -     BASIC METABOLIC PANEL WITH GFR -     CT Chest Wo Contrast; Future  Congestive  dilated cardiomyopathy (HCC) -     spironolactone (ALDACTONE) 25 MG tablet; Take 1 tablet by mouth daily. -     Brain natriuretic peptide -     BASIC METABOLIC PANEL WITH GFR  Systolic dysfunction with acute on chronic heart failure (HCC) -     spironolactone (ALDACTONE) 25 MG tablet; Take 1 tablet by mouth daily. -     Brain natriuretic peptide -     BASIC METABOLIC PANEL WITH GFR  Pt has lost 2lbs and feels better.  25mg  of spirolactone has not caused BP to fall.  BP looks great today.  BNP and BMP ordered today.  Suspect SOB is all cardiac however,  Will get Chest CT to evaluate SOB.  Start lasix 60mg  daily. Continue to watch BP for dropping or symptoms  of hypotension.  Follow up in 1 week.

## 2021-05-02 NOTE — Patient Instructions (Addendum)
Chest CT of lungs Increase lasix to 1 and 1/2 tablet daily.  Keep spirolactone at 1 full tablet.  Recheck in 1 week.

## 2021-05-03 LAB — BASIC METABOLIC PANEL WITH GFR
BUN/Creatinine Ratio: 15 (calc) (ref 6–22)
BUN: 20 mg/dL (ref 7–25)
CO2: 27 mmol/L (ref 20–32)
Calcium: 9.9 mg/dL (ref 8.6–10.3)
Chloride: 106 mmol/L (ref 98–110)
Creat: 1.35 mg/dL — ABNORMAL HIGH (ref 0.70–1.22)
Glucose, Bld: 90 mg/dL (ref 65–99)
Potassium: 4.2 mmol/L (ref 3.5–5.3)
Sodium: 142 mmol/L (ref 135–146)
eGFR: 53 mL/min/{1.73_m2} — ABNORMAL LOW (ref >=60)

## 2021-05-03 LAB — BRAIN NATRIURETIC PEPTIDE: Brain Natriuretic Peptide: 287 pg/mL — ABNORMAL HIGH (ref <100)

## 2021-05-05 NOTE — Progress Notes (Signed)
Remote ICD transmission.   

## 2021-05-05 NOTE — Progress Notes (Signed)
Kidney function staying the same but BNP is rising. How are you tolerating the increase in lasix?

## 2021-05-08 ENCOUNTER — Ambulatory Visit: Payer: Medicare Other | Admitting: Physician Assistant

## 2021-05-09 ENCOUNTER — Other Ambulatory Visit: Payer: Self-pay

## 2021-05-09 ENCOUNTER — Ambulatory Visit (INDEPENDENT_AMBULATORY_CARE_PROVIDER_SITE_OTHER): Payer: Medicare Other

## 2021-05-09 DIAGNOSIS — Z8679 Personal history of other diseases of the circulatory system: Secondary | ICD-10-CM

## 2021-05-09 DIAGNOSIS — R0600 Dyspnea, unspecified: Secondary | ICD-10-CM | POA: Diagnosis not present

## 2021-05-09 DIAGNOSIS — R0602 Shortness of breath: Secondary | ICD-10-CM | POA: Diagnosis not present

## 2021-05-12 ENCOUNTER — Other Ambulatory Visit: Payer: Self-pay

## 2021-05-12 ENCOUNTER — Ambulatory Visit (INDEPENDENT_AMBULATORY_CARE_PROVIDER_SITE_OTHER): Payer: Medicare Other

## 2021-05-12 ENCOUNTER — Ambulatory Visit (INDEPENDENT_AMBULATORY_CARE_PROVIDER_SITE_OTHER): Payer: Medicare Other | Admitting: Physician Assistant

## 2021-05-12 VITALS — BP 116/49 | HR 63 | Ht 70.0 in | Wt 242.0 lb

## 2021-05-12 DIAGNOSIS — I288 Other diseases of pulmonary vessels: Secondary | ICD-10-CM | POA: Diagnosis not present

## 2021-05-12 DIAGNOSIS — Z95810 Presence of automatic (implantable) cardiac defibrillator: Secondary | ICD-10-CM

## 2021-05-12 DIAGNOSIS — I5023 Acute on chronic systolic (congestive) heart failure: Secondary | ICD-10-CM

## 2021-05-12 DIAGNOSIS — I5022 Chronic systolic (congestive) heart failure: Secondary | ICD-10-CM | POA: Diagnosis not present

## 2021-05-12 DIAGNOSIS — I255 Ischemic cardiomyopathy: Secondary | ICD-10-CM

## 2021-05-12 DIAGNOSIS — I480 Paroxysmal atrial fibrillation: Secondary | ICD-10-CM

## 2021-05-12 DIAGNOSIS — R0602 Shortness of breath: Secondary | ICD-10-CM | POA: Diagnosis not present

## 2021-05-12 DIAGNOSIS — I10 Essential (primary) hypertension: Secondary | ICD-10-CM | POA: Diagnosis not present

## 2021-05-12 DIAGNOSIS — I420 Dilated cardiomyopathy: Secondary | ICD-10-CM

## 2021-05-12 NOTE — Progress Notes (Signed)
Subjective:    Patient ID: Earl Hatcher Sr., male    DOB: 1940/11/02, 80 y.o.   MRN: [id number]  HPI Pt is a 80 yo obese male with T2DM, CHF, HLD, PAF who presents to the clinic to follow up on blood pressure and SOB.   He is feeling better on full tablet of spirolactone and lasix 60mg . BP is remaining stable. No dizziness. No increase in lower ext edema.   CT results:    IMPRESSION: 1. No acute intrathoracic findings. 2. Cardiomegaly. 3. 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 4. Aortic and coronary artery atherosclerosis (ICD10-I70.0).  .. Active Ambulatory Problems    Diagnosis Date Noted   Essential hypertension, benign 04/28/2013   BPH (benign prostatic hyperplasia) 04/28/2013   Gout 04/28/2013   Congestive dilated cardiomyopathy ([place]) 05/10/2013   ICD (implantable cardioverter-defibrillator) in place 05/10/2013   Elevated PSA 40/98/1191   Systolic dysfunction with acute on chronic heart failure ([place]) 09/03/2014   Morbid obesity ([place]) 09/03/2014   Erectile dysfunction 03/10/2015   Lower extremity edema 09/29/2017   Abnormal ultrasound of lower extremity 09/29/2017   Malignant neoplasm of prostate ([place]) 02/03/2018   Family history of cancer 02/03/2018   Family history of prostate cancer    Family history of breast cancer    Genetic testing 03/25/2018   Hypotension 05/23/2018   Paroxysmal atrial fibrillation ([place]) 12/25/2015   History of gout 08/01/2018   Type 2 diabetes mellitus with chronic kidney disease, 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[place]) 08/01/2018   Acute left-sided low back pain without sciatica 05/16/2019   Syncope and collapse 47/82/9562   Chronic systolic heart failure ([place]) 06/08/2019   Ventricular fibrillation ([place]) 08/16/2019   Fall 08/16/2019   Impetigo 08/18/2019   Rectal bleeding 12/11/2019   Hyperlipidemia LDL goal <70 12/11/2019   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     Objective:   Physical Exam Vitals reviewed.  Constitutional:      Appearance: Normal appearance. He is obese.  HENT:     Head: Normocephalic.  Cardiovascular:     Rate and Rhythm: Normal rate and regular rhythm.     Pulses: Normal pulses.     Heart sounds: Murmur heard.  Pulmonary:     Effort: Pulmonary effort is normal.     Breath sounds: Normal breath sounds.  Musculoskeletal:     Right lower leg: Edema present.     Left lower leg: Edema present.     Comments: 1+pitting bilateral lower extremity edema.   Neurological:     General: No focal deficit present.     Mental Status: He is alert and oriented to person, place, and time.  Psychiatric:        Mood and Affect: Mood normal.          Assessment & Plan:  [name] KitchenMarland KitchenTreg was seen today for follow-up.  Diagnoses and all orders for this visit:  SOB (shortness of breath)  Paroxysmal atrial fibrillation (HCC)  Essential hypertension,  benign  Congestive dilated cardiomyopathy (HCC)  Systolic dysfunction with acute on chronic heart failure (HCC)  Dilation of pulmonary artery (HCC)  Vitals stable.  CT shows clear lungs and know CAD, cardiomegaly and pulmonary artery dilation(4.7cm).  Will send note to cardiology for update.  Continue on spirolactone 25mg  and lasix 60mg .  Follow up as needed or in 2 months.

## 2021-05-12 NOTE — Progress Notes (Signed)
No acute changes.  Pulmonary artery is dilated but not uncommon with his cardiomyopathy.  His lasix increase should help with this.  Coronary artery plaque noted. Stay on statin.

## 2021-05-14 ENCOUNTER — Encounter: Payer: Self-pay | Admitting: Physician Assistant

## 2021-05-14 DIAGNOSIS — I288 Other diseases of pulmonary vessels: Secondary | ICD-10-CM | POA: Insufficient documentation

## 2021-05-16 ENCOUNTER — Other Ambulatory Visit: Payer: Self-pay | Admitting: Physician Assistant

## 2021-05-16 ENCOUNTER — Telehealth: Payer: Self-pay

## 2021-05-16 DIAGNOSIS — E782 Mixed hyperlipidemia: Secondary | ICD-10-CM

## 2021-05-16 NOTE — Progress Notes (Signed)
EPIC Encounter for ICM Monitoring  Patient Name: Earl Granada Sr. is a 80 y.o. male Date: 05/16/2021 Primary Care Physican: Lavada Mesi Primary Cardiologist: [name] Electrophysiologist: Allred Bi-V Pacing:  96% 01/23/2021 Office Weight: 240 lbs    AT/AF Burden 0%            Attempted call to patient and unable to reach.  Transmission reviewed.    CorVue thoracic impedance suggesting normal fluid levels.     Prescribed:   Furosemide 40 mg 1 tablet (40 mg total) daily.    Spironolactone 25 mg take 0.5 tablet (12.5 mg total) daily Eliquis 5 mg take 1 tablet twice a day.   Labs: 12/03/2020 Creatinine 1.21, BUN 18, Potassium 5.1, Sodium 142, GFR 61 10/11/2020 Creatinine 1.32, BUN 18, Potassium 5.0, Sodium 144, GFR 55 A complete set of results can be found in Results Review.   Recommendations:  Unable to reach.     Follow-up plan: ICM clinic phone appointment on 06/16/2021.   91 day device clinic remote transmission 07/25/2021.     EP/Cardiology Office Visits:  Recall 08/05/2021 with Oda Kilts, PA.   Copy of ICM check sent to Dr. Rayann Heman.   3 month ICM trend: 05/12/2021.    12-14 Month ICM trend:       Rosalene Billings, RN 05/16/2021 7:52 AM

## 2021-05-16 NOTE — Telephone Encounter (Signed)
Remote ICM transmission received.  Attempted call to patient regarding ICM remote transmission and no answer or voice mail option.  

## 2021-05-22 ENCOUNTER — Other Ambulatory Visit: Payer: Self-pay | Admitting: Physician Assistant

## 2021-05-22 DIAGNOSIS — I420 Dilated cardiomyopathy: Secondary | ICD-10-CM

## 2021-05-22 DIAGNOSIS — I5023 Acute on chronic systolic (congestive) heart failure: Secondary | ICD-10-CM

## 2021-05-22 DIAGNOSIS — I10 Essential (primary) hypertension: Secondary | ICD-10-CM

## 2021-05-23 ENCOUNTER — Other Ambulatory Visit: Payer: Self-pay | Admitting: Physician Assistant

## 2021-05-23 DIAGNOSIS — E1122 Type 2 diabetes mellitus with diabetic chronic kidney disease: Secondary | ICD-10-CM

## 2021-05-30 ENCOUNTER — Other Ambulatory Visit: Payer: Self-pay | Admitting: Physician Assistant

## 2021-05-30 DIAGNOSIS — E782 Mixed hyperlipidemia: Secondary | ICD-10-CM

## 2021-06-04 DIAGNOSIS — Z23 Encounter for immunization: Secondary | ICD-10-CM | POA: Diagnosis not present

## 2021-06-16 ENCOUNTER — Ambulatory Visit (INDEPENDENT_AMBULATORY_CARE_PROVIDER_SITE_OTHER): Payer: Medicare Other

## 2021-06-16 DIAGNOSIS — Z95810 Presence of automatic (implantable) cardiac defibrillator: Secondary | ICD-10-CM | POA: Diagnosis not present

## 2021-06-16 DIAGNOSIS — I5022 Chronic systolic (congestive) heart failure: Secondary | ICD-10-CM | POA: Diagnosis not present

## 2021-06-18 ENCOUNTER — Telehealth: Payer: Self-pay

## 2021-06-18 NOTE — Telephone Encounter (Signed)
Remote ICM transmission received.  Attempted call to patient regarding ICM remote transmission and no answer or voice mail option.  

## 2021-06-18 NOTE — Progress Notes (Signed)
EPIC Encounter for ICM Monitoring  Patient Name: Earl Paynter Sr. is a 80 y.o. male Date: 06/18/2021 Primary Care Physican: Donella Stade, PA-C Electrophysiologist: Allred Bi-V Pacing:  96% 05/12/2021 Office Weight: 242 lbs    AT/AF Burden 0%            Attempted call to patient and unable to reach.  Transmission reviewed.    CorVue thoracic impedance suggesting normal fluid levels but was suggesting possible fluid accumulation from 12/15//-12/18     Prescribed:   Furosemide 40 mg 1 tablet (40 mg total) daily.    Spironolactone 25 mg take 0.5 tablet (12.5 mg total) daily Eliquis 5 mg take 1 tablet twice a day.   Labs: 12/03/2020 Creatinine 1.21, BUN 18, Potassium 5.1, Sodium 142, GFR 61 10/11/2020 Creatinine 1.32, BUN 18, Potassium 5.0, Sodium 144, GFR 55 A complete set of results can be found in Results Review.   Recommendations:  Unable to reach.     Follow-up plan: ICM clinic phone appointment on 07/21/2021.   91 day device clinic remote transmission 07/25/2021.     EP/Cardiology Office Visits:  Recall 08/05/2021 with Oda Kilts, PA.   Copy of ICM check sent to Dr. Rayann Heman.   3 month ICM trend: 06/16/2021.    12-14 Month ICM trend:       Rosalene Billings, RN 06/18/2021 2:44 PM

## 2021-06-22 ENCOUNTER — Other Ambulatory Visit: Payer: Self-pay | Admitting: Cardiology

## 2021-06-22 DIAGNOSIS — I420 Dilated cardiomyopathy: Secondary | ICD-10-CM

## 2021-06-22 DIAGNOSIS — I10 Essential (primary) hypertension: Secondary | ICD-10-CM

## 2021-06-22 DIAGNOSIS — I5023 Acute on chronic systolic (congestive) heart failure: Secondary | ICD-10-CM

## 2021-06-28 ENCOUNTER — Other Ambulatory Visit: Payer: Self-pay | Admitting: Physician Assistant

## 2021-06-28 DIAGNOSIS — E782 Mixed hyperlipidemia: Secondary | ICD-10-CM

## 2021-07-02 ENCOUNTER — Other Ambulatory Visit: Payer: Self-pay | Admitting: Physician Assistant

## 2021-07-02 DIAGNOSIS — E782 Mixed hyperlipidemia: Secondary | ICD-10-CM

## 2021-07-15 ENCOUNTER — Other Ambulatory Visit: Payer: Self-pay | Admitting: Physician Assistant

## 2021-07-15 DIAGNOSIS — E1122 Type 2 diabetes mellitus with diabetic chronic kidney disease: Secondary | ICD-10-CM

## 2021-07-17 ENCOUNTER — Other Ambulatory Visit: Payer: Self-pay | Admitting: Physician Assistant

## 2021-07-17 DIAGNOSIS — E782 Mixed hyperlipidemia: Secondary | ICD-10-CM

## 2021-07-18 NOTE — Telephone Encounter (Signed)
Patient has his follow up with Jade scheduled for 07/25/2021.

## 2021-07-21 ENCOUNTER — Ambulatory Visit (INDEPENDENT_AMBULATORY_CARE_PROVIDER_SITE_OTHER): Payer: Medicare Other

## 2021-07-21 DIAGNOSIS — Z95810 Presence of automatic (implantable) cardiac defibrillator: Secondary | ICD-10-CM | POA: Diagnosis not present

## 2021-07-21 DIAGNOSIS — I5022 Chronic systolic (congestive) heart failure: Secondary | ICD-10-CM

## 2021-07-23 NOTE — Progress Notes (Signed)
EPIC Encounter for ICM Monitoring  Patient Name: Earl Obryan Sr. is a 81 y.o. male Date: 07/23/2021 Primary Care Physican: Donella Stade, PA-C Electrophysiologist: Allred Bi-V Pacing:  96% 07/23/2021 Weight: 235 lbs    AT/AF Burden 0%            Spoke with patient and heart failure questions reviewed.  Pt asymptomatic for fluid accumulation.  Reports feeling well at this time and voices no complaints.    CorVue thoracic impedance suggesting normal fluid levels.     Prescribed:   Furosemide 40 mg 1 tablet (40 mg total) daily.    Spironolactone 25 mg take 0.5 tablet (12.5 mg total) daily Eliquis 5 mg take 1 tablet twice a day.   Labs: 05/02/2021 Creatinine 1.35, BUN 20, Potassium 4.2, Sodium 142, GFR 53 04/24/2021 Creatinine 1.32, BUN 21, Potassium 4.6, Sodium 143, GFR 55 12/03/2020 Creatinine 1.21, BUN 18, Potassium 5.1, Sodium 142, GFR 61 10/11/2020 Creatinine 1.32, BUN 18, Potassium 5.0, Sodium 144, GFR 55 A complete set of results can be found in Results Review.   Recommendations:  No changes and encouraged to call if experiencing any fluid symptoms.    Follow-up plan: ICM clinic phone appointment on 08/25/2021.   91 day device clinic remote transmission 07/25/2021.     EP/Cardiology Office Visits:  Recall 08/05/2021 with Oda Kilts, PA.   Copy of ICM check sent to Dr. Rayann Heman.   3 month ICM trend: 07/21/2021.    12-14 Month ICM trend:     Rosalene Billings, RN 07/23/2021 8:33 AM

## 2021-07-25 ENCOUNTER — Other Ambulatory Visit: Payer: Self-pay | Admitting: Physician Assistant

## 2021-07-25 ENCOUNTER — Other Ambulatory Visit: Payer: Self-pay

## 2021-07-25 ENCOUNTER — Ambulatory Visit (INDEPENDENT_AMBULATORY_CARE_PROVIDER_SITE_OTHER): Payer: Medicare Other

## 2021-07-25 ENCOUNTER — Ambulatory Visit (INDEPENDENT_AMBULATORY_CARE_PROVIDER_SITE_OTHER): Payer: Medicare Other | Admitting: Physician Assistant

## 2021-07-25 ENCOUNTER — Encounter: Payer: Self-pay | Admitting: Physician Assistant

## 2021-07-25 VITALS — BP 129/51 | HR 64 | Ht 70.0 in | Wt 247.0 lb

## 2021-07-25 DIAGNOSIS — I255 Ischemic cardiomyopathy: Secondary | ICD-10-CM

## 2021-07-25 DIAGNOSIS — I5023 Acute on chronic systolic (congestive) heart failure: Secondary | ICD-10-CM | POA: Diagnosis not present

## 2021-07-25 DIAGNOSIS — Z79899 Other long term (current) drug therapy: Secondary | ICD-10-CM | POA: Diagnosis not present

## 2021-07-25 DIAGNOSIS — E782 Mixed hyperlipidemia: Secondary | ICD-10-CM

## 2021-07-25 DIAGNOSIS — E1122 Type 2 diabetes mellitus with diabetic chronic kidney disease: Secondary | ICD-10-CM | POA: Diagnosis not present

## 2021-07-25 DIAGNOSIS — Z23 Encounter for immunization: Secondary | ICD-10-CM

## 2021-07-25 DIAGNOSIS — I10 Essential (primary) hypertension: Secondary | ICD-10-CM

## 2021-07-25 DIAGNOSIS — I480 Paroxysmal atrial fibrillation: Secondary | ICD-10-CM

## 2021-07-25 DIAGNOSIS — I420 Dilated cardiomyopathy: Secondary | ICD-10-CM

## 2021-07-25 LAB — CUP PACEART REMOTE DEVICE CHECK
Battery Remaining Longevity: 85 mo
Battery Remaining Percentage: 88 %
Battery Voltage: 3.01 V
Brady Statistic AP VP Percent: 95 %
Brady Statistic AP VS Percent: 2.8 %
Brady Statistic AS VP Percent: 1 %
Brady Statistic AS VS Percent: 1 %
Brady Statistic RA Percent Paced: 96 %
Date Time Interrogation Session: 20230127020139
HighPow Impedance: 43 Ohm
Implantable Lead Implant Date: 20140117
Implantable Lead Implant Date: 20140117
Implantable Lead Implant Date: 20140117
Implantable Lead Location: 753858
Implantable Lead Location: 753859
Implantable Lead Location: 753860
Implantable Pulse Generator Implant Date: 20220426
Lead Channel Impedance Value: 400 Ohm
Lead Channel Impedance Value: 410 Ohm
Lead Channel Impedance Value: 990 Ohm
Lead Channel Pacing Threshold Amplitude: 0.75 V
Lead Channel Pacing Threshold Amplitude: 0.875 V
Lead Channel Pacing Threshold Amplitude: 1.125 V
Lead Channel Pacing Threshold Pulse Width: 0.5 ms
Lead Channel Pacing Threshold Pulse Width: 0.5 ms
Lead Channel Pacing Threshold Pulse Width: 0.5 ms
Lead Channel Sensing Intrinsic Amplitude: 12 mV
Lead Channel Sensing Intrinsic Amplitude: 5 mV
Lead Channel Setting Pacing Amplitude: 1.375
Lead Channel Setting Pacing Amplitude: 1.75 V
Lead Channel Setting Pacing Amplitude: 2.125
Lead Channel Setting Pacing Pulse Width: 0.5 ms
Lead Channel Setting Pacing Pulse Width: 0.5 ms
Lead Channel Setting Sensing Sensitivity: 0.5 mV
Pulse Gen Serial Number: 810027402

## 2021-07-25 LAB — POCT GLYCOSYLATED HEMOGLOBIN (HGB A1C): Hemoglobin A1C: 6.5 % — AB (ref 4.0–5.6)

## 2021-07-25 MED ORDER — ATORVASTATIN CALCIUM 80 MG PO TABS: ORAL_TABLET | ORAL | 3 refills | Status: DC

## 2021-07-25 MED ORDER — SYNJARDY XR 10-1000 MG PO TB24: 1.0000 | ORAL_TABLET | Freq: Every day | ORAL | 0 refills | Status: DC

## 2021-07-25 NOTE — Progress Notes (Signed)
Subjective:    Patient ID: Earl Hatcher Sr., male    DOB: 01-20-1941, 81 y.o.   MRN: [id number]  HPI Pt is a 81 yo obese male with T2DM, CHF, HLD, HTN, PAF who presents to the clinic for 3 month follow up.   Pt is doing better. His SOB is better than a few months ago. He has intermittent swelling of lower legs. No CP or palpitations. He has defibrillator but has not gone off.   He admits his diet has not been good. He is not exercising.   .. Active Ambulatory Problems    Diagnosis Date Noted   Essential hypertension, benign 04/28/2013   BPH (benign prostatic hyperplasia) 04/28/2013   Gout 04/28/2013   Congestive dilated cardiomyopathy ([place]) 05/10/2013   ICD (implantable cardioverter-defibrillator) in place 05/10/2013   Elevated PSA 29/93/7169   Systolic dysfunction with acute on chronic heart failure ([place]) 09/03/2014   Morbid obesity ([place]) 09/03/2014   Erectile dysfunction 03/10/2015   Lower extremity edema 09/29/2017   Abnormal ultrasound of lower extremity 09/29/2017   Malignant neoplasm of prostate ([place]) 02/03/2018   Family history of cancer 02/03/2018   Family history of prostate cancer    Family history of breast cancer    Genetic testing 03/25/2018   Hypotension 05/23/2018   Paroxysmal atrial fibrillation ([place]) 12/25/2015   History of gout 08/01/2018   Type 2 diabetes mellitus with chronic kidney disease, 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[place]) 08/01/2018   Acute left-sided low back pain without sciatica 05/16/2019   Syncope and collapse 67/89/3810   Chronic systolic heart failure ([place]) 06/08/2019   Ventricular fibrillation ([place]) 08/16/2019   Fall 08/16/2019   Impetigo 08/18/2019   Rectal bleeding 12/11/2019   Mixed hyperlipidemia 12/11/2019   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     Objective:   Physical Exam Vitals reviewed.  Constitutional:      Appearance: Normal appearance. He is obese.  HENT:     Head: Normocephalic.  Neck:     Vascular: No carotid bruit.  Cardiovascular:     Rate and Rhythm: Normal rate and regular rhythm.     Pulses: Normal pulses.     Heart sounds: Murmur heard.  Pulmonary:     Effort: Pulmonary effort is normal.     Breath sounds: Normal breath sounds.  Musculoskeletal:     Right lower leg: Edema present.     Left lower leg: Edema present.  Neurological:     General: No focal deficit present.     Mental Status: He is alert and oriented to person, place, and time.  Psychiatric:        Mood and Affect: Mood normal.    .. Results for orders placed or performed in visit on 07/25/21  COMPLETE METABOLIC PANEL WITH GFR  Result Value Ref Range   Glucose, Bld 102 (H) 65 - 99 mg/dL   BUN 27 (H) 7 - 25 mg/dL   Creat 1.25 (H) 0.70 - 1.22 mg/dL  eGFR 58 (L) > OR = 60 mL/min/1.35m   BUN/Creatinine Ratio 22 6 - 22 (calc)   Sodium 143 135 - 146 mmol/L   Potassium 4.1 3.5 - 5.3 mmol/L   Chloride 105 98 - 110 mmol/L   CO2 28 20 - 32 mmol/L   Calcium 9.9 8.6 - 10.3 mg/dL   Total Protein 6.5 6.1 - 8.1 g/dL   Albumin 4.0 3.6 - 5.1 g/dL   Globulin 2.5 1.9 - 3.7 g/dL (calc)   AG Ratio 1.6 1.0 - 2.5 (calc)   Total Bilirubin 0.7 0.2 - 1.2 mg/dL   Alkaline phosphatase (APISO) 73 35 - 144 U/L   AST 21 10 - 35 U/L   ALT 20 9 - 46 U/L  POCT HgB A1C  Result Value Ref Range   Hemoglobin A1C 6.5 (A) 4.0 - 5.6 %   HbA1c POC (<> result, manual entry)     HbA1c, POC (prediabetic range)     HbA1c, POC (controlled diabetic range)           Assessment & Plan:   .[name] KitchenMarland KitchenPhenixwas seen today for follow-up.  Diagnoses and all orders for this visit:  Type 2 diabetes mellitus with chronic kidney disease, without long-term current use of insulin, unspecified CKD stage (HCC) -     Empagliflozin-metFORMIN HCl ER (SYNJARDY XR) 03-999 MG TB24; Take 1 tablet by mouth daily.  Medication management -     COMPLETE METABOLIC PANEL WITH GFR -     POCT HgB A1C  Mixed hyperlipidemia -     atorvastatin (LIPITOR) 80 MG tablet; TAKE 1 TABLET BY MOUTH ONCE DAILY.  Systolic dysfunction with acute on chronic heart failure (HCC)  Need for influenza vaccination -     Flu vaccine HIGH DOSE PF (Fluzone High dose)  Congestive dilated cardiomyopathy (HCC)  Paroxysmal atrial fibrillation (HCC)  Essential hypertension, benign   A1C to goal.  Continue trulicity and synjardy Continue to walk 150 minutes a week BP to goal. Continue ACE/ARB combination Check CMP today. On statin.  Needs eye exam.  Needs shingrix. Instructed to go to pharmacy. Pneumonia vaccine UTD.  Covid vaccine UTD.  Flu vaccine UTD.  Follow up in 3 months.

## 2021-07-26 LAB — COMPLETE METABOLIC PANEL WITH GFR
AG Ratio: 1.6 (calc) (ref 1.0–2.5)
ALT: 20 U/L (ref 9–46)
AST: 21 U/L (ref 10–35)
Albumin: 4 g/dL (ref 3.6–5.1)
Alkaline phosphatase (APISO): 73 U/L (ref 35–144)
BUN/Creatinine Ratio: 22 (calc) (ref 6–22)
BUN: 27 mg/dL — ABNORMAL HIGH (ref 7–25)
CO2: 28 mmol/L (ref 20–32)
Calcium: 9.9 mg/dL (ref 8.6–10.3)
Chloride: 105 mmol/L (ref 98–110)
Creat: 1.25 mg/dL — ABNORMAL HIGH (ref 0.70–1.22)
Globulin: 2.5 g/dL (calc) (ref 1.9–3.7)
Glucose, Bld: 102 mg/dL — ABNORMAL HIGH (ref 65–99)
Potassium: 4.1 mmol/L (ref 3.5–5.3)
Sodium: 143 mmol/L (ref 135–146)
Total Bilirubin: 0.7 mg/dL (ref 0.2–1.2)
Total Protein: 6.5 g/dL (ref 6.1–8.1)
eGFR: 58 mL/min/{1.73_m2} — ABNORMAL LOW (ref >=60)

## 2021-07-28 NOTE — Progress Notes (Signed)
Electrolytes look good. Kidney function has improved some.

## 2021-07-29 ENCOUNTER — Encounter: Payer: Self-pay | Admitting: Physician Assistant

## 2021-07-31 ENCOUNTER — Other Ambulatory Visit: Payer: Self-pay | Admitting: Physician Assistant

## 2021-07-31 DIAGNOSIS — I10 Essential (primary) hypertension: Secondary | ICD-10-CM

## 2021-07-31 DIAGNOSIS — I420 Dilated cardiomyopathy: Secondary | ICD-10-CM

## 2021-07-31 DIAGNOSIS — I5023 Acute on chronic systolic (congestive) heart failure: Secondary | ICD-10-CM

## 2021-08-04 NOTE — Progress Notes (Signed)
Remote ICD transmission.   

## 2021-08-20 ENCOUNTER — Other Ambulatory Visit: Payer: Self-pay | Admitting: Physician Assistant

## 2021-08-20 DIAGNOSIS — I420 Dilated cardiomyopathy: Secondary | ICD-10-CM

## 2021-08-20 DIAGNOSIS — I10 Essential (primary) hypertension: Secondary | ICD-10-CM

## 2021-08-20 DIAGNOSIS — I5023 Acute on chronic systolic (congestive) heart failure: Secondary | ICD-10-CM

## 2021-08-25 ENCOUNTER — Ambulatory Visit (INDEPENDENT_AMBULATORY_CARE_PROVIDER_SITE_OTHER): Payer: Medicare Other

## 2021-08-25 DIAGNOSIS — Z95810 Presence of automatic (implantable) cardiac defibrillator: Secondary | ICD-10-CM

## 2021-08-25 DIAGNOSIS — I5022 Chronic systolic (congestive) heart failure: Secondary | ICD-10-CM | POA: Diagnosis not present

## 2021-08-27 ENCOUNTER — Other Ambulatory Visit: Payer: Self-pay | Admitting: Internal Medicine

## 2021-08-27 DIAGNOSIS — I480 Paroxysmal atrial fibrillation: Secondary | ICD-10-CM

## 2021-08-27 NOTE — Telephone Encounter (Signed)
Prescription refill request for Eliquis received. ?Indication: Afib  ?Last office visit:01/23/21 ([facility])  ?Scr: 1.25 (07/25/21)  ?Age: 81 ?Weight: 112kg ? ?Appropriate dose and refill sent to requested pharmacy.  ?

## 2021-08-29 NOTE — Progress Notes (Signed)
EPIC Encounter for ICM Monitoring  Patient Name: Earl Shouse Sr. is a 81 y.o. male Date: 08/29/2021 Primary Care Physican: Lavada Mesi Primary Cardiologist: [name] Electrophysiologist: [facility] Bi-V Pacing:  96% 07/23/2021 Weight: 235 lbs    AT/AF Burden 0%            Spoke with patient and heart failure questions reviewed.  Pt reports leg swelling during decreased impedance but is almost resolved..    CorVue thoracic impedance suggesting normal fluid levels. But was suggesting possible fluid accumulation from 2/17-2/24 & 2/7-2/10   Prescribed:   Furosemide 40 mg 1 tablet (40 mg total) daily.    Spironolactone 25 mg take 0.5 tablet (12.5 mg total) daily Eliquis 5 mg take 1 tablet twice a day.   Labs: 05/02/2021 Creatinine 1.35, BUN 20, Potassium 4.2, Sodium 142, GFR 53 04/24/2021 Creatinine 1.32, BUN 21, Potassium 4.6, Sodium 143, GFR 55 12/03/2020 Creatinine 1.21, BUN 18, Potassium 5.1, Sodium 142, GFR 61 10/11/2020 Creatinine 1.32, BUN 18, Potassium 5.0, Sodium 144, GFR 55 A complete set of results can be found in Results Review.   Recommendations:  Recommendation to limit salt intake to 2000 mg daily and fluid intake to 64 oz daily.  Encouraged to call if experiencing any fluid symptoms.     Follow-up plan: ICM clinic phone appointment on 09/29/2021.   91 day device clinic remote transmission 10/24/2021.     EP/Cardiology Office Visits:  Recall 08/05/2021 with Oda Kilts, PA.   Copy of ICM check sent to Dr. Rayann Heman.   3 month ICM trend: 08/25/2021.    12-14 Month ICM trend:     Rosalene Billings, RN 08/29/2021 12:44 PM

## 2021-09-29 ENCOUNTER — Ambulatory Visit (INDEPENDENT_AMBULATORY_CARE_PROVIDER_SITE_OTHER): Payer: Medicare Other

## 2021-09-29 DIAGNOSIS — I5022 Chronic systolic (congestive) heart failure: Secondary | ICD-10-CM | POA: Diagnosis not present

## 2021-09-29 DIAGNOSIS — Z95810 Presence of automatic (implantable) cardiac defibrillator: Secondary | ICD-10-CM | POA: Diagnosis not present

## 2021-10-03 NOTE — Progress Notes (Signed)
EPIC Encounter for ICM Monitoring ? ?Patient Name: Earl Apolinar Sr. is a 81 y.o. male ?Date: 10/03/2021 ?Primary Care Physican: Donella Stade, PA-C ?Primary Cardiologist: [name] ?Electrophysiologist: [facility] ?Bi-V Pacing:  96% ?07/23/2021 Weight: 235 lbs  ?10/03/2021 Weight: 235 lbs ?  ?AT/AF Burden 0%   ?   ?      Spoke with patient and heart failure questions reviewed.  Pt had a little let swelling earlier this week but now resolved.   ?  ?CorVue thoracic impedance suggesting normal fluid levels.  ?  ?Prescribed:   ?Furosemide 40 mg 1 tablet (40 mg total) daily.    ?Spironolactone 25 mg take 0.5 tablet (12.5 mg total) daily ?Eliquis 5 mg take 1 tablet twice a day. ?  ?Labs: ?05/02/2021 Creatinine 1.35, BUN 20, Potassium 4.2, Sodium 142, GFR 53 ?04/24/2021 Creatinine 1.32, BUN 21, Potassium 4.6, Sodium 143, GFR 55 ?12/03/2020 Creatinine 1.21, BUN 18, Potassium 5.1, Sodium 142, GFR 61 ?10/11/2020 Creatinine 1.32, BUN 18, Potassium 5.0, Sodium 144, GFR 55 ?A complete set of results can be found in Results Review. ?  ?Recommendations:  No changes and encouraged to call if experiencing any fluid symptoms.   ?  ?Follow-up plan: ICM clinic phone appointment on 11/03/2021.   91 day device clinic remote transmission 10/24/2021.   ?  ?EP/Cardiology Office Visits:  Recall 08/05/2021 with Oda Kilts, PA. ?  ?Copy of ICM check sent to Dr. Rayann Heman.  ? ?3 month ICM trend: 09/29/2021. ? ? ? ?12-14 Month ICM trend:  ? ? ? ?Rosalene Billings, RN ?10/03/2021 ?8:50 AM ? ?

## 2021-10-11 DIAGNOSIS — Z23 Encounter for immunization: Secondary | ICD-10-CM | POA: Diagnosis not present

## 2021-10-24 ENCOUNTER — Ambulatory Visit (INDEPENDENT_AMBULATORY_CARE_PROVIDER_SITE_OTHER): Payer: Medicare Other

## 2021-10-24 DIAGNOSIS — I5022 Chronic systolic (congestive) heart failure: Secondary | ICD-10-CM

## 2021-10-24 DIAGNOSIS — I255 Ischemic cardiomyopathy: Secondary | ICD-10-CM

## 2021-10-24 LAB — CUP PACEART REMOTE DEVICE CHECK
Battery Remaining Longevity: 82 mo
Battery Remaining Percentage: 85 %
Battery Voltage: 2.99 V
Brady Statistic AP VP Percent: 95 %
Brady Statistic AP VS Percent: 2.9 %
Brady Statistic AS VP Percent: 1 %
Brady Statistic AS VS Percent: 1 %
Brady Statistic RA Percent Paced: 97 %
Date Time Interrogation Session: 20230428020046
HighPow Impedance: 45 Ohm
Implantable Lead Implant Date: 20140117
Implantable Lead Implant Date: 20140117
Implantable Lead Implant Date: 20140117
Implantable Lead Location: 753858
Implantable Lead Location: 753859
Implantable Lead Location: 753860
Implantable Pulse Generator Implant Date: 20220426
Lead Channel Impedance Value: 380 Ohm
Lead Channel Impedance Value: 410 Ohm
Lead Channel Impedance Value: 800 Ohm
Lead Channel Pacing Threshold Amplitude: 0.75 V
Lead Channel Pacing Threshold Amplitude: 0.875 V
Lead Channel Pacing Threshold Amplitude: 1.125 V
Lead Channel Pacing Threshold Pulse Width: 0.5 ms
Lead Channel Pacing Threshold Pulse Width: 0.5 ms
Lead Channel Pacing Threshold Pulse Width: 0.5 ms
Lead Channel Sensing Intrinsic Amplitude: 12 mV
Lead Channel Sensing Intrinsic Amplitude: 5 mV
Lead Channel Setting Pacing Amplitude: 1.375
Lead Channel Setting Pacing Amplitude: 1.75 V
Lead Channel Setting Pacing Amplitude: 2.125
Lead Channel Setting Pacing Pulse Width: 0.5 ms
Lead Channel Setting Pacing Pulse Width: 0.5 ms
Lead Channel Setting Sensing Sensitivity: 0.5 mV
Pulse Gen Serial Number: 810027402

## 2021-11-03 ENCOUNTER — Ambulatory Visit (INDEPENDENT_AMBULATORY_CARE_PROVIDER_SITE_OTHER): Payer: Medicare Other | Admitting: Physician Assistant

## 2021-11-03 ENCOUNTER — Encounter: Payer: Self-pay | Admitting: Physician Assistant

## 2021-11-03 ENCOUNTER — Ambulatory Visit (INDEPENDENT_AMBULATORY_CARE_PROVIDER_SITE_OTHER): Payer: Medicare Other

## 2021-11-03 VITALS — BP 100/52 | HR 64 | Ht 70.0 in | Wt 243.0 lb

## 2021-11-03 DIAGNOSIS — I5023 Acute on chronic systolic (congestive) heart failure: Secondary | ICD-10-CM

## 2021-11-03 DIAGNOSIS — I10 Essential (primary) hypertension: Secondary | ICD-10-CM

## 2021-11-03 DIAGNOSIS — I420 Dilated cardiomyopathy: Secondary | ICD-10-CM

## 2021-11-03 DIAGNOSIS — I959 Hypotension, unspecified: Secondary | ICD-10-CM | POA: Diagnosis not present

## 2021-11-03 DIAGNOSIS — I255 Ischemic cardiomyopathy: Secondary | ICD-10-CM

## 2021-11-03 DIAGNOSIS — M7989 Other specified soft tissue disorders: Secondary | ICD-10-CM | POA: Diagnosis not present

## 2021-11-03 DIAGNOSIS — E1122 Type 2 diabetes mellitus with diabetic chronic kidney disease: Secondary | ICD-10-CM

## 2021-11-03 DIAGNOSIS — I480 Paroxysmal atrial fibrillation: Secondary | ICD-10-CM | POA: Diagnosis not present

## 2021-11-03 DIAGNOSIS — R0602 Shortness of breath: Secondary | ICD-10-CM

## 2021-11-03 DIAGNOSIS — I5022 Chronic systolic (congestive) heart failure: Secondary | ICD-10-CM | POA: Diagnosis not present

## 2021-11-03 DIAGNOSIS — Z95810 Presence of automatic (implantable) cardiac defibrillator: Secondary | ICD-10-CM

## 2021-11-03 DIAGNOSIS — E559 Vitamin D deficiency, unspecified: Secondary | ICD-10-CM | POA: Insufficient documentation

## 2021-11-03 LAB — POCT GLYCOSYLATED HEMOGLOBIN (HGB A1C): Hemoglobin A1C: 6.5 % — AB (ref 4.0–5.6)

## 2021-11-03 NOTE — Progress Notes (Signed)
? ?Established Patient Office Visit ? ?Subjective   ?Patient ID: Earl Palos Sr., male    DOB: 1940/10/04  Age: 81 y.o. MRN: [id number] ? ?Chief Complaint  ?Patient presents with  ? Follow-up  ? ? ?HPI ?Pt is a 81 yo obese male with CHF, HTN, PAF, T2DM,BPH who presents to the clinic for medication follow up.  ? ?He reports more swelling off and on and in lower extremity and shortness of breath. He noticed in grocery stores and just out and bout his energy tolerance is much lower. No fever, chills, body aches.  ? ?He is not checking his sugars. He is taking his medication daily. No open sores or wounds. No hypoglycemic events. No CP. He does have some palpitations from time to time.  ? ?ROS ?See HPI.  ?  ?Objective:  ?  ? ?BP (!) 100/52   Pulse 64   Ht _0  (1.778 m)   Wt 243 lb (110.2 kg)   SpO2 99%   BMI 34.87 kg/m?  ?BP Readings from Last 3 Encounters:  ?11/03/21 (!) 100/52  ?07/25/21 (!) 129/51  ?05/12/21 (!) 116/49  ? ?Wt Readings from Last 3 Encounters:  ?11/03/21 243 lb (110.2 kg)  ?07/25/21 247 lb (112 kg)  ?05/12/21 242 lb (109.8 kg)  ? ?  ? ?Physical Exam ?Vitals reviewed.  ?Constitutional:   ?   General: He is not in acute distress. ?   Appearance: Normal appearance. He is obese. He is not ill-appearing.  ?HENT:  ?   Head: Normocephalic.  ?   Right Ear: Tympanic membrane normal.  ?   Left Ear: Tympanic membrane normal.  ?   Nose: Nose normal.  ?   Mouth/Throat:  ?   Mouth: Mucous membranes are moist.  ?Eyes:  ?   Conjunctiva/sclera: Conjunctivae normal.  ?Cardiovascular:  ?   Rate and Rhythm: Normal rate and regular rhythm.  ?   Heart sounds: Murmur heard.  ?Pulmonary:  ?   Effort: Pulmonary effort is normal.  ?   Breath sounds: Wheezing present. No rhonchi.  ?Musculoskeletal:  ?   Cervical back: Normal range of motion and neck supple.  ?   Comments: Scant bilateral ankle edema  ?Lymphadenopathy:  ?   Cervical: No cervical adenopathy.  ?Neurological:  ?   General: No focal deficit present.  ?    Mental Status: He is alert and oriented to person, place, and time.  ?Psychiatric:     ?   Mood and Affect: Mood normal.  ? ? ? ?Results for orders placed or performed in visit on 11/03/21  ?B12 and Folate Panel  ?Result Value Ref Range  ? Vitamin B-12 333 200 - 1,100 pg/mL  ? Folate 12.9 ng/mL  ?VITAMIN D 25 Hydroxy (Vit-D Deficiency, Fractures)  ?Result Value Ref Range  ? Vit D, 25-Hydroxy 15 (L) 30 - 100 ng/mL  ?COMPLETE METABOLIC PANEL WITH GFR  ?Result Value Ref Range  ? Glucose, Bld 110 (H) 65 - 99 mg/dL  ? BUN 18 7 - 25 mg/dL  ? Creat 1.23 (H) 0.70 - 1.22 mg/dL  ? eGFR 59 (L) > OR = 60 mL/min/1.65m  ? BUN/Creatinine Ratio 15 6 - 22 (calc)  ? Sodium 143 135 - 146 mmol/L  ? Potassium 4.3 3.5 - 5.3 mmol/L  ? Chloride 109 98 - 110 mmol/L  ? CO2 25 20 - 32 mmol/L  ? Calcium 9.4 8.6 - 10.3 mg/dL  ? Total Protein 6.4 6.1 - 8.1 g/dL  ?  Albumin 3.9 3.6 - 5.1 g/dL  ? Globulin 2.5 1.9 - 3.7 g/dL (calc)  ? AG Ratio 1.6 1.0 - 2.5 (calc)  ? Total Bilirubin 0.4 0.2 - 1.2 mg/dL  ? Alkaline phosphatase (APISO) 79 35 - 144 U/L  ? AST 17 10 - 35 U/L  ? ALT 14 9 - 46 U/L  ?TSH  ?Result Value Ref Range  ? TSH 2.35 0.40 - 4.50 mIU/L  ?Fe+TIBC+Fer  ?Result Value Ref Range  ? Iron 57 50 - 180 mcg/dL  ? TIBC 270 250 - 425 mcg/dL (calc)  ? %SAT 21 20 - 48 % (calc)  ? Ferritin 63 24 - 380 ng/mL  ?CBC with Differential/Platelet  ?Result Value Ref Range  ? WBC 5.0 3.8 - 10.8 Thousand/uL  ? RBC 5.00 4.20 - 5.80 Million/uL  ? Hemoglobin 15.0 13.2 - 17.1 g/dL  ? HCT 46.0 38.5 - 50.0 %  ? MCV 92.0 80.0 - 100.0 fL  ? MCH 30.0 27.0 - 33.0 pg  ? MCHC 32.6 32.0 - 36.0 g/dL  ? RDW 14.4 11.0 - 15.0 %  ? Platelets 213 140 - 400 Thousand/uL  ? MPV 11.1 7.5 - 12.5 fL  ? Neutro Abs 2,815 1,500 - 7,800 cells/uL  ? Lymphs Abs 1,490 850 - 3,900 cells/uL  ? Absolute Monocytes 495 200 - 950 cells/uL  ? Eosinophils Absolute 160 15 - 500 cells/uL  ? Basophils Absolute 40 0 - 200 cells/uL  ? Neutrophils Relative % 56.3 %  ? Total Lymphocyte 29.8 %  ?  Monocytes Relative 9.9 %  ? Eosinophils Relative 3.2 %  ? Basophils Relative 0.8 %  ?Brain natriuretic peptide  ?Result Value Ref Range  ? Brain Natriuretic Peptide 230 (H) <100 pg/mL  ?POCT glycosylated hemoglobin (Hb A1C)  ?Result Value Ref Range  ? Hemoglobin A1C 6.5 (A) 4.0 - 5.6 %  ? HbA1c POC (<> result, manual entry)    ? HbA1c, POC (prediabetic range)    ? HbA1c, POC (controlled diabetic range)    ? ? ? ?Assessment & Plan:  ?..Earl White was seen today for follow-up. ? ?Diagnoses and all orders for this visit: ? ?Type 2 diabetes mellitus with chronic kidney disease, without long-term current use of insulin, unspecified CKD stage (HCC) ?-     POCT glycosylated hemoglobin (Hb A1C) ?-     B12 and Folate Panel ?-     VITAMIN D 25 Hydroxy (Vit-D Deficiency, Fractures) ?-     COMPLETE METABOLIC PANEL WITH GFR ?-     TSH ?-     Fe+TIBC+Fer ?-     CBC with Differential/Platelet ?-     Brain natriuretic peptide ?-     Empagliflozin-metFORMIN HCl ER (SYNJARDY XR) 03-999 MG TB24; Take 1 tablet by mouth daily. ? ?Essential hypertension, benign ?-     B12 and Folate Panel ?-     VITAMIN D 25 Hydroxy (Vit-D Deficiency, Fractures) ?-     COMPLETE METABOLIC PANEL WITH GFR ?-     TSH ?-     Fe+TIBC+Fer ?-     CBC with Differential/Platelet ?-     Brain natriuretic peptide ? ?Congestive dilated cardiomyopathy (HCC) ?-     B12 and Folate Panel ?-     VITAMIN D 25 Hydroxy (Vit-D Deficiency, Fractures) ?-     COMPLETE METABOLIC PANEL WITH GFR ?-     TSH ?-     Fe+TIBC+Fer ?-     CBC with Differential/Platelet ?-  Brain natriuretic peptide ? ?Systolic dysfunction with acute on chronic heart failure (HCC) ?-     B12 and Folate Panel ?-     VITAMIN D 25 Hydroxy (Vit-D Deficiency, Fractures) ?-     COMPLETE METABOLIC PANEL WITH GFR ?-     TSH ?-     Fe+TIBC+Fer ?-     CBC with Differential/Platelet ?-     Brain natriuretic peptide ? ?Paroxysmal atrial fibrillation (HCC) ?-     B12 and Folate Panel ?-     VITAMIN D 25 Hydroxy (Vit-D  Deficiency, Fractures) ?-     COMPLETE METABOLIC PANEL WITH GFR ?-     TSH ?-     Fe+TIBC+Fer ?-     CBC with Differential/Platelet ?-     Brain natriuretic peptide ? ?SOB (shortness of breath) ?-     B12 and Folate Panel ?-     VITAMIN D 25 Hydroxy (Vit-D Deficiency, Fractures) ?-     COMPLETE METABOLIC PANEL WITH GFR ?-     TSH ?-     Fe+TIBC+Fer ?-     CBC with Differential/Platelet ?-     Brain natriuretic peptide ? ?Localized swelling of both lower extremities ?-     B12 and Folate Panel ?-     VITAMIN D 25 Hydroxy (Vit-D Deficiency, Fractures) ?-     COMPLETE METABOLIC PANEL WITH GFR ?-     TSH ?-     Fe+TIBC+Fer ?-     CBC with Differential/Platelet ?-     Brain natriuretic peptide ? ?Vitamin D insufficiency ?-     B12 and Folate Panel ?-     VITAMIN D 25 Hydroxy (Vit-D Deficiency, Fractures) ?-     COMPLETE METABOLIC PANEL WITH GFR ?-     TSH ?-     Fe+TIBC+Fer ?-     CBC with Differential/Platelet ?-     Brain natriuretic peptide ? ?Hypotension, unspecified hypotension type ? ?a1C to goal ?Continue on same medications ?Needs eye exam ?Foot exam UTD ?On statin ?On ACE/ARB ?Covid x5 ?Pneumonia/flu shot UTD. ?Needs shingrix(please get at local pharmacy)  ? ?Hypotension ?Make sure drinking enough fluids  ?On hot days back off of lasix ?Consider compression stockings ? ?SOB ?Cbc/TSH/b12/vitamin D/CmP/ferritin/iron ?Ordered to rule out other causes of SOB. ?? Lungs will get spirometry, no hx of smoking ?? Heart recheck BNP ?Last echo 07/2020 may need to recheck follow up with cards ?? Some physical deconditioning. Encouraged to keep walking daily with good deep breathing ?Keep follow up with cardiology ? ?Return in about 3 months (around 02/03/2022) for spironmetry to sch and then 3 months DM check up.  ? ? ?[name], PA-C ? ?

## 2021-11-04 ENCOUNTER — Encounter: Payer: Self-pay | Admitting: Physician Assistant

## 2021-11-04 ENCOUNTER — Other Ambulatory Visit: Payer: Self-pay | Admitting: Physician Assistant

## 2021-11-04 DIAGNOSIS — R0602 Shortness of breath: Secondary | ICD-10-CM | POA: Insufficient documentation

## 2021-11-04 DIAGNOSIS — M7989 Other specified soft tissue disorders: Secondary | ICD-10-CM | POA: Insufficient documentation

## 2021-11-04 LAB — CBC WITH DIFFERENTIAL/PLATELET
Absolute Monocytes: 495 cells/uL (ref 200–950)
Basophils Absolute: 40 cells/uL (ref 0–200)
Basophils Relative: 0.8 %
Eosinophils Absolute: 160 cells/uL (ref 15–500)
Eosinophils Relative: 3.2 %
HCT: 46 % (ref 38.5–50.0)
Hemoglobin: 15 g/dL (ref 13.2–17.1)
Lymphs Abs: 1490 cells/uL (ref 850–3900)
MCH: 30 pg (ref 27.0–33.0)
MCHC: 32.6 g/dL (ref 32.0–36.0)
MCV: 92 fL (ref 80.0–100.0)
MPV: 11.1 fL (ref 7.5–12.5)
Monocytes Relative: 9.9 %
Neutro Abs: 2815 cells/uL (ref 1500–7800)
Neutrophils Relative %: 56.3 %
Platelets: 213 10*3/uL (ref 140–400)
RBC: 5 10*6/uL (ref 4.20–5.80)
RDW: 14.4 % (ref 11.0–15.0)
Total Lymphocyte: 29.8 %
WBC: 5 10*3/uL (ref 3.8–10.8)

## 2021-11-04 LAB — COMPLETE METABOLIC PANEL WITH GFR
AG Ratio: 1.6 (calc) (ref 1.0–2.5)
ALT: 14 U/L (ref 9–46)
AST: 17 U/L (ref 10–35)
Albumin: 3.9 g/dL (ref 3.6–5.1)
Alkaline phosphatase (APISO): 79 U/L (ref 35–144)
BUN/Creatinine Ratio: 15 (calc) (ref 6–22)
BUN: 18 mg/dL (ref 7–25)
CO2: 25 mmol/L (ref 20–32)
Calcium: 9.4 mg/dL (ref 8.6–10.3)
Chloride: 109 mmol/L (ref 98–110)
Creat: 1.23 mg/dL — ABNORMAL HIGH (ref 0.70–1.22)
Globulin: 2.5 g/dL (calc) (ref 1.9–3.7)
Glucose, Bld: 110 mg/dL — ABNORMAL HIGH (ref 65–99)
Potassium: 4.3 mmol/L (ref 3.5–5.3)
Sodium: 143 mmol/L (ref 135–146)
Total Bilirubin: 0.4 mg/dL (ref 0.2–1.2)
Total Protein: 6.4 g/dL (ref 6.1–8.1)
eGFR: 59 mL/min/{1.73_m2} — ABNORMAL LOW (ref >=60)

## 2021-11-04 LAB — BRAIN NATRIURETIC PEPTIDE: Brain Natriuretic Peptide: 230 pg/mL — ABNORMAL HIGH (ref <100)

## 2021-11-04 LAB — B12 AND FOLATE PANEL
Folate: 12.9 ng/mL
Vitamin B-12: 333 pg/mL (ref 200–1100)

## 2021-11-04 LAB — VITAMIN D 25 HYDROXY (VIT D DEFICIENCY, FRACTURES): Vit D, 25-Hydroxy: 15 ng/mL — ABNORMAL LOW (ref 30–100)

## 2021-11-04 LAB — TSH: TSH: 2.35 mIU/L (ref 0.40–4.50)

## 2021-11-04 LAB — IRON,TIBC AND FERRITIN PANEL
%SAT: 21 % (calc) (ref 20–48)
Ferritin: 63 ng/mL (ref 24–380)
Iron: 57 ug/dL (ref 50–180)
TIBC: 270 mcg/dL (calc) (ref 250–425)

## 2021-11-04 MED ORDER — VITAMIN D (ERGOCALCIFEROL) 1.25 MG (50000 UNIT) PO CAPS: 50000.0000 [IU] | ORAL_CAPSULE | ORAL | 3 refills | Status: AC

## 2021-11-04 MED ORDER — SYNJARDY XR 10-1000 MG PO TB24: 1.0000 | ORAL_TABLET | Freq: Every day | ORAL | 3 refills | Status: DC

## 2021-11-04 NOTE — Progress Notes (Signed)
Kidney function is stable.  ?Liver looks great.  ?B12 low normal. Start b12 1025mg daily.  ?Thyroid looks good.  ?Iron stable.  ?BNP better than 6 months ago but still elevated which can cause shortness of breath. This signals there is fluid pressure around heart.  ?Vitamin D very low. Will send high dose weekly to take with dairy.  ? ?Getting b12 and vitamin D up could help energy. When is next cardiology follow up?

## 2021-11-06 ENCOUNTER — Telehealth: Payer: Self-pay

## 2021-11-06 NOTE — Progress Notes (Signed)
EPIC Encounter for ICM Monitoring ? ?Patient Name: Earl Pernell Sr. is a 81 y.o. male ?Date: 11/06/2021 ?Primary Care Physican: Donella Stade, PA-C ?Primary Cardiologist: [name] ?Electrophysiologist: [facility] ?Bi-V Pacing:  96% ?07/23/2021 Weight: 235 lbs  ?10/03/2021 Weight: 235 lbs ?  ?AT/AF Burden 0%   ?   ?      Attempted call to patient and unable to reach. Transmission reviewed.  ?  ?CorVue thoracic impedance suggesting normal fluid levels.  ?  ?Prescribed:   ?Furosemide 40 mg 1 tablet (40 mg total) daily.    ?Spironolactone 25 mg take 0.5 tablet (12.5 mg total) daily ?Eliquis 5 mg take 1 tablet twice a day. ?  ?Labs: ?11/03/2021 Creatinine 1.23, BUN 18, Potassium 4.3, Sodium 143, GFR 59 ?07/25/2021 Creatinine 1.25, BUN 27, Potassium 4.1, Sodium 143, GFR 58  ?A complete set of results can be found in Results Review. ?  ?Recommendations:  Unable to reach.     ?  ?Follow-up plan: ICM clinic phone appointment on 12/08/2021.   91 day device clinic remote transmission 01/23/2022.   ?  ?EP/Cardiology Office Visits:  Recall 08/05/2021 with Oda Kilts, PA. ?  ?Copy of ICM check sent to Dr. Rayann Heman.  ?  ?3 month ICM trend: 11/03/2021. ? ? ? ?12-14 Month ICM trend:  ? ? ? ?Rosalene Billings, RN ?11/06/2021 ?2:00 PM ? ?

## 2021-11-06 NOTE — Telephone Encounter (Signed)
Remote ICM transmission received.  Attempted call to patient regarding ICM remote transmission and no answer or voice mail option.  

## 2021-11-07 NOTE — Progress Notes (Signed)
Remote ICD transmission.   

## 2021-11-17 ENCOUNTER — Ambulatory Visit (INDEPENDENT_AMBULATORY_CARE_PROVIDER_SITE_OTHER): Payer: Medicare Other | Admitting: Physician Assistant

## 2021-11-17 VITALS — BP 108/54 | HR 64 | Ht 70.0 in | Wt 240.0 lb

## 2021-11-17 DIAGNOSIS — R0602 Shortness of breath: Secondary | ICD-10-CM | POA: Diagnosis not present

## 2021-11-17 DIAGNOSIS — I288 Other diseases of pulmonary vessels: Secondary | ICD-10-CM

## 2021-11-17 DIAGNOSIS — I420 Dilated cardiomyopathy: Secondary | ICD-10-CM

## 2021-11-17 DIAGNOSIS — I255 Ischemic cardiomyopathy: Secondary | ICD-10-CM

## 2021-11-17 DIAGNOSIS — R942 Abnormal results of pulmonary function studies: Secondary | ICD-10-CM

## 2021-11-17 DIAGNOSIS — I959 Hypotension, unspecified: Secondary | ICD-10-CM | POA: Diagnosis not present

## 2021-11-17 MED ORDER — ALBUTEROL SULFATE HFA 108 (90 BASE) MCG/ACT IN AERS
4.0000 | INHALATION_SPRAY | Freq: Once | RESPIRATORY_TRACT | Status: AC
  Administered 2021-11-17: 4 via RESPIRATORY_TRACT

## 2021-11-17 NOTE — Patient Instructions (Signed)
Will make pulmonology referral.

## 2021-11-17 NOTE — Progress Notes (Signed)
Established Patient Office Visit  Subjective   Patient ID: Earl Churchwell Sr., male    DOB: 04-23-1941  Age: 81 y.o. MRN: [id number]  Chief Complaint  Patient presents with  . Shortness of Breath    HPI Pt is a 81 yo obese male with CHF, HTN, PAF, T2DM with ICD who presents to the clinic for spirometry for shortness of breath. SOB is not getting better but not getting worse. Denies any worsening peripheral edema. BNP was 230 at last check a week or so ago.   .. Active Ambulatory Problems    Diagnosis Date Noted  . Essential hypertension, benign 04/28/2013  . BPH (benign prostatic hyperplasia) 04/28/2013  . Gout 04/28/2013  . Congestive dilated cardiomyopathy ([place]) 05/10/2013  . ICD (implantable cardioverter-defibrillator) in place 05/10/2013  . Elevated PSA 11/24/2013  . Systolic dysfunction with acute on chronic heart failure ([place]) 09/03/2014  . Morbid obesity ([place]) 09/03/2014  . Erectile dysfunction 03/10/2015  . Lower extremity edema 09/29/2017  . Abnormal ultrasound of lower extremity 09/29/2017  . Malignant neoplasm of prostate ([place]) 02/03/2018  . Family history of cancer 02/03/2018  . Family history of prostate cancer   . Family history of breast cancer   . Genetic testing 03/25/2018  . Hypotension 05/23/2018  . Paroxysmal atrial fibrillation ([place]) 12/25/2015  . History of gout 08/01/2018  . Type 2 diabetes mellitus with chronic kidney disease, 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[place]) 08/01/2018  . Acute left-sided low back pain without sciatica 05/16/2019  . Syncope and collapse 05/24/2019  . Chronic systolic heart failure ([place]) 06/08/2019  . Ventricular fibrillation ([place]) 08/16/2019  . Fall 08/16/2019  . Impetigo 08/18/2019  . Rectal bleeding 12/11/2019  . Mixed hyperlipidemia 12/11/2019  . 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 Vitamin D deficiency 11/03/2021  . SOB (shortness of breath) 11/04/2021  . Localized swelling of both lower  extremities 11/04/2021   Resolved Ambulatory Problems    Diagnosis Date Noted  . Diabetes mellitus due to underlying condition, controlled, without complication, 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[place]) 04/28/2013   Past Medical History:  Diagnosis Date  . CHF (congestive heart failure) ([place])   . Diabetes mellitus ([place])   . Hyperlipidemia   . Hypertension   . Nonischemic cardiomyopathy ([place])   . Prostate cancer ([place])      ROS See HPI.    Objective:     BP (!) 108/54   Pulse 64   Ht '5\' 10"'$  (1.778 m)   Wt 240 lb (108.9 kg)   SpO2 95%   BMI 34.44 kg/m  BP Readings from Last 3 Encounters:  11/17/21 (!) 108/54  11/03/21 (!) 100/52  07/25/21 (!) 129/51   Wt Readings from Last 3 Encounters:  11/17/21 240 lb (108.9 kg)  11/03/21 243 lb (110.2 kg)  07/25/21 247 lb (112 kg)         Assessment & Plan:  [name] KitchenMarland KitchenZylan was seen today for shortness of breath.  Diagnoses and all orders for this visit:  SOB (shortness of breath) -     PR EVAL OF BRONCHOSPASM -     albuterol (VENTOLIN HFA) 108 (90 Base) MCG/ACT inhaler 4 puff  Hypotension, unspecified hypotension type  2nd BP check up some which is good No medication changes made today  FEV1 was 52 percent FVC was 57 percent FEF 25-75 was a change of -16 percent Restrictive pattern Likely due to CHF and some fluid overload with BNP 230.    No  follow-ups on file.    [name], PA-C

## 2021-11-18 ENCOUNTER — Encounter: Payer: Self-pay | Admitting: Physician Assistant

## 2021-11-18 DIAGNOSIS — R942 Abnormal results of pulmonary function studies: Secondary | ICD-10-CM | POA: Insufficient documentation

## 2021-11-21 ENCOUNTER — Other Ambulatory Visit: Payer: Self-pay | Admitting: Physician Assistant

## 2021-11-21 DIAGNOSIS — I420 Dilated cardiomyopathy: Secondary | ICD-10-CM

## 2021-11-21 DIAGNOSIS — I5023 Acute on chronic systolic (congestive) heart failure: Secondary | ICD-10-CM

## 2021-11-22 IMAGING — CT CT CHEST W/O CM
2 of 4 series · 15 of 36 positions shown, 18 images · non-contrast
Comparison: X-ray 04/24/2021

CLINICAL DATA: Dyspnea, history of CHF

EXAM:
CT CHEST WITHOUT CONTRAST
TECHNIQUE: Multidetector CT imaging of the chest was performed following the
standard protocol without IV contrast.

[Series 2: thorax · axial · 0.86mm/px · z∈[-326,-44]mm · 12 of 167 slices shown, 15 images]
[im 13/167  mediastinal]
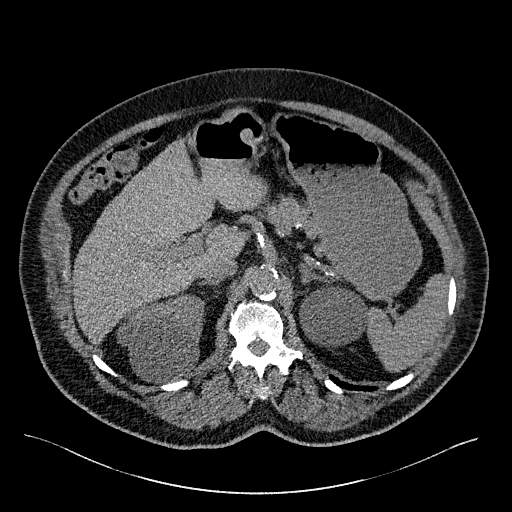
[im 13/167  lung]
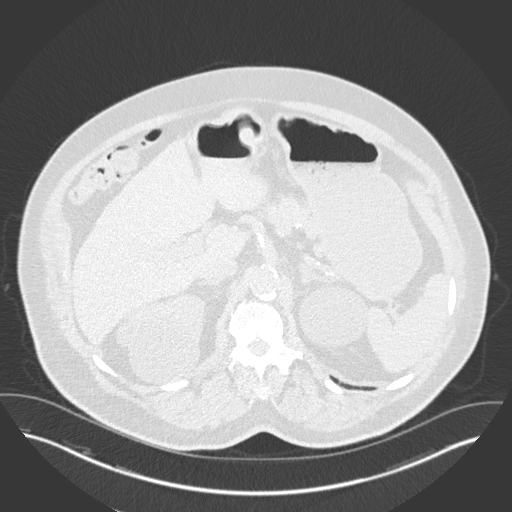
[im 26/167  lung]
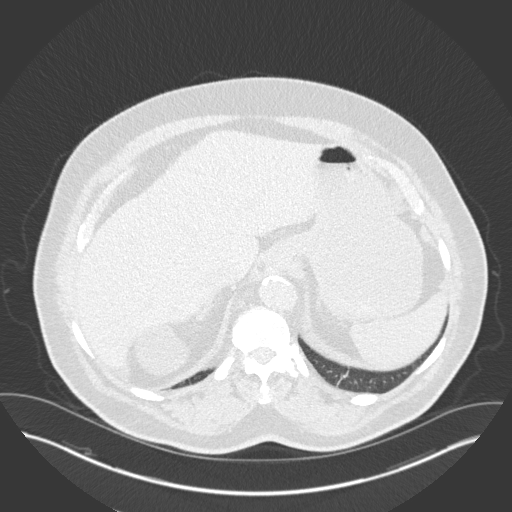
[im 39/167  lung]
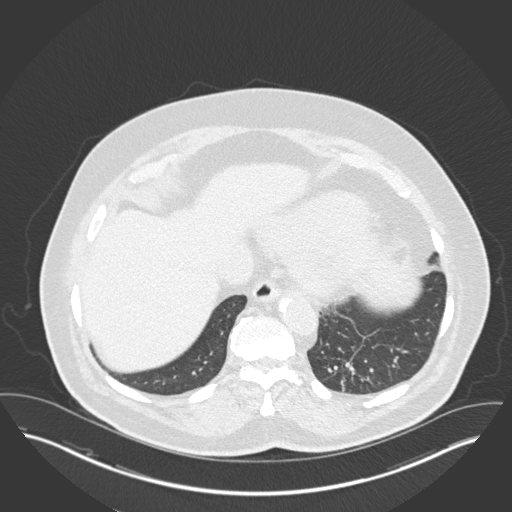
[im 52/167  lung]
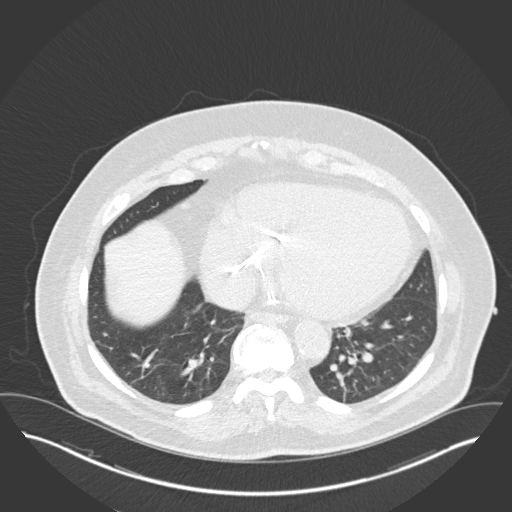
[im 64/167  mediastinal]
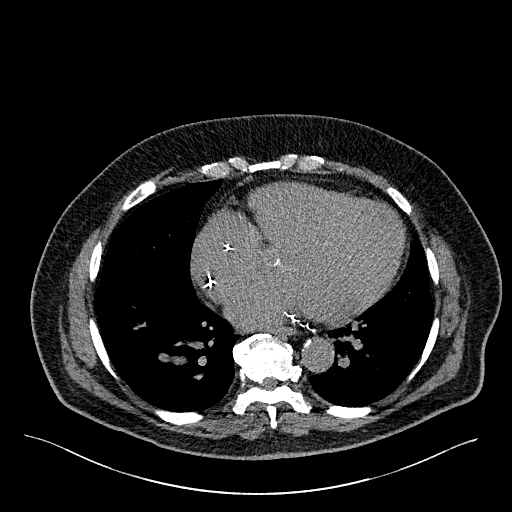
[im 64/167  lung]
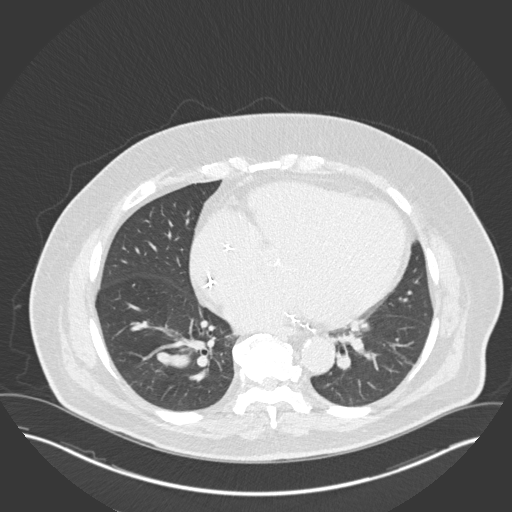
[im 77/167  lung]
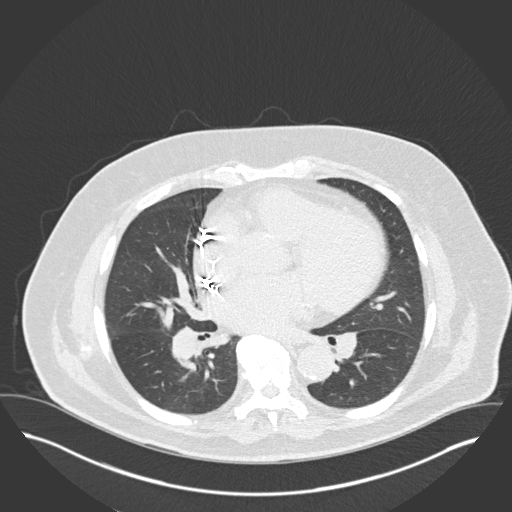
[im 90/167  lung]
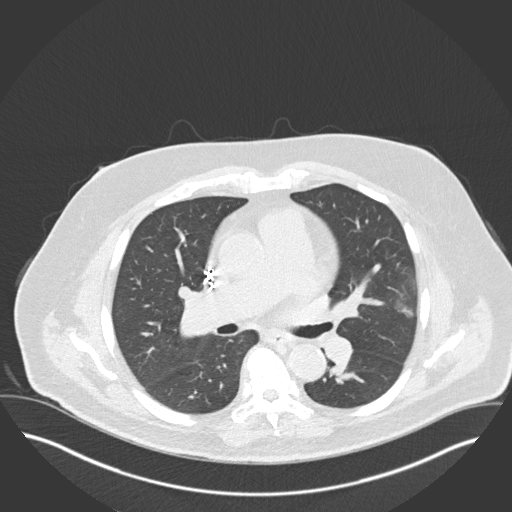
[im 103/167  lung]
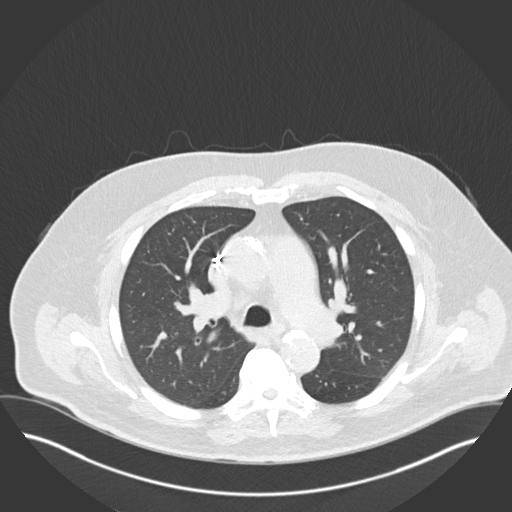
[im 115/167  mediastinal]
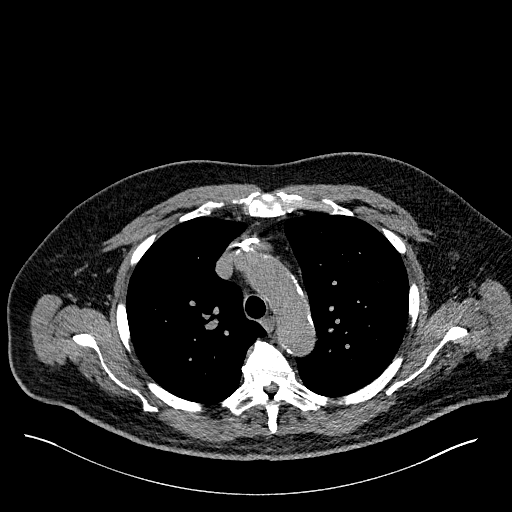
[im 115/167  lung]
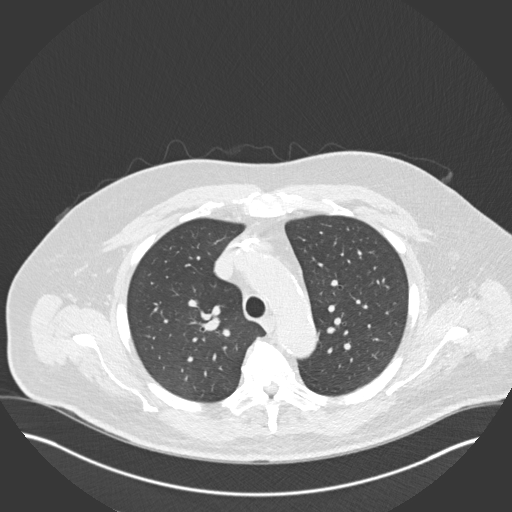
[im 128/167  lung]
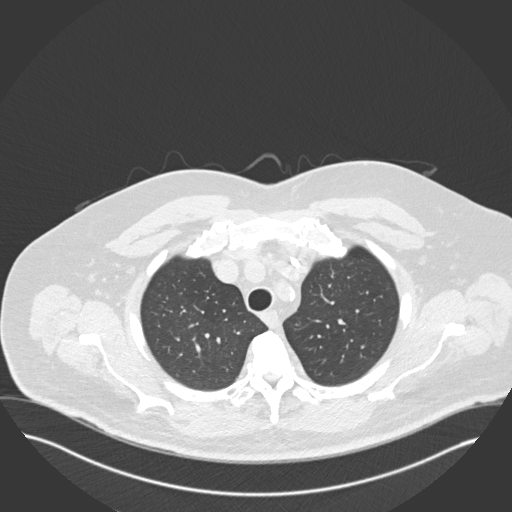
[im 141/167  lung]
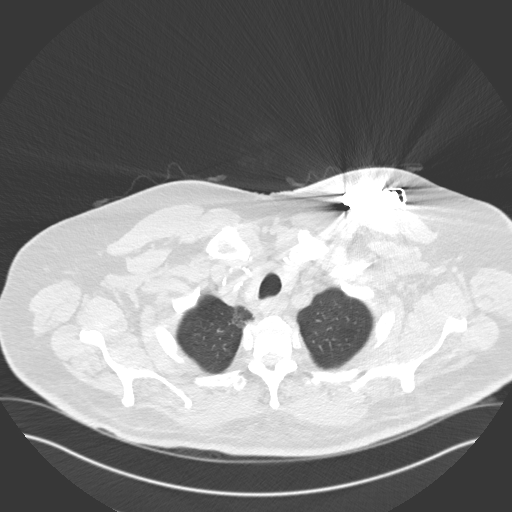
[im 154/167  lung]
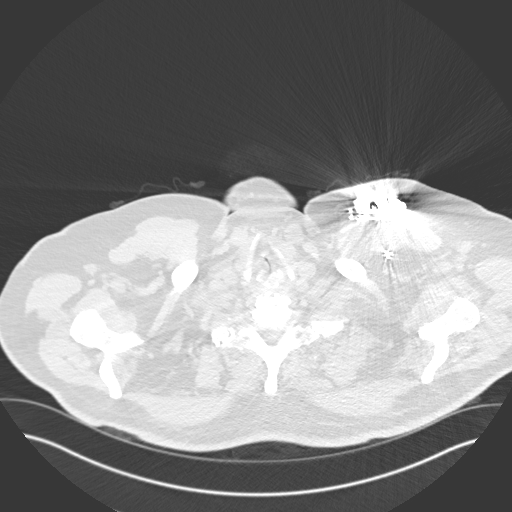

[Series 5: coronal · coronal · 0.67mm/px · 3 of 151 slices shown]
[im 31/151  lung]
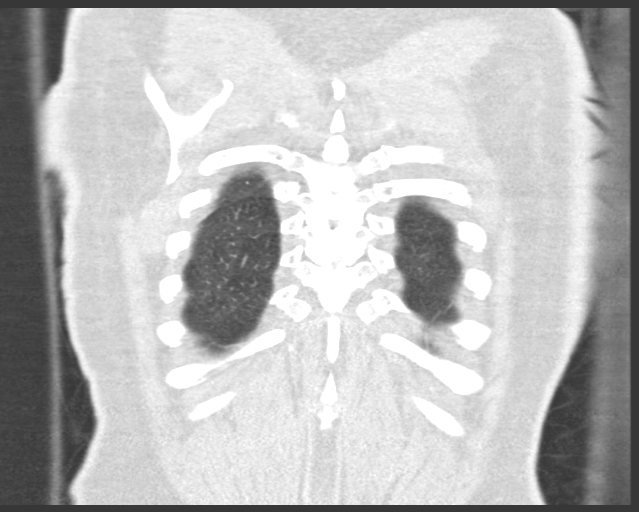
[im 61/151  lung]
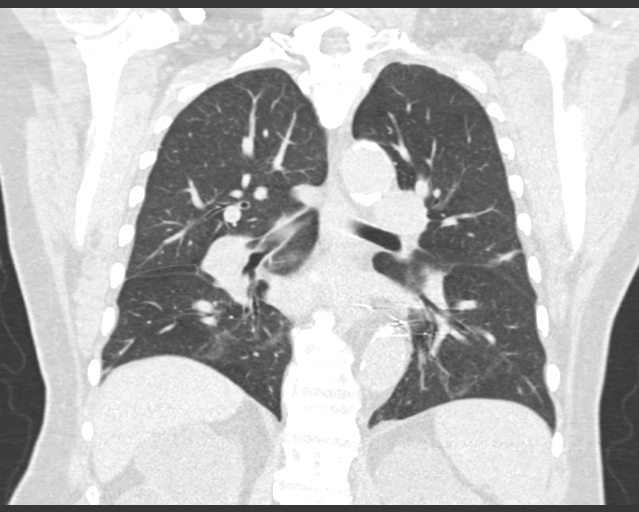
[im 91/151  lung]
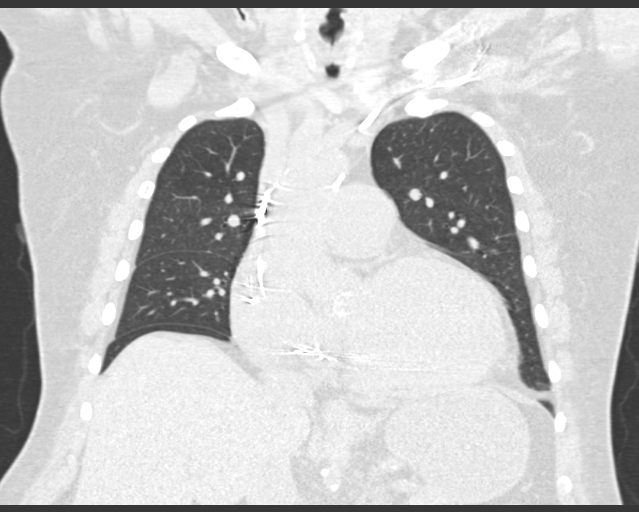

[15 of 36 positions shown; findings below may reference images not displayed]

FINDINGS: Cardiovascular: Left-sided implanted cardiac device in place.
Cardiomegaly. No pericardial effusion. Thoracic aorta is
nonaneurysmal. Atherosclerotic calcifications of the aorta and
coronary arteries. Main pulmonary artery is dilated measuring 4.7 cm
in diameter.

Mediastinum/Nodes: No enlarged mediastinal or axillary lymph nodes.
Thyroid gland, trachea, and esophagus demonstrate no significant
findings.

Lungs/Pleura: Lungs are clear. No pleural effusion or pneumothorax.

Upper Abdomen: Partially visualized bilateral renal cysts. No acute
findings within the included upper abdomen.

Musculoskeletal: Multilevel bridging anterior endplate osteophytes
throughout the thoracic spine suggesting diffuse idiopathic skeletal
hyperostosis. Intervertebral disc spaces are preserved. No acute
bony findings. No chest wall abnormality.
IMPRESSION: 1. No acute intrathoracic findings.
2. Cardiomegaly.
3. Dilated main pulmonary artery measuring 4.7 cm in diameter,
suggesting pulmonary arterial hypertension.
4. Aortic and coronary artery atherosclerosis ([id number]).

## 2021-11-27 ENCOUNTER — Other Ambulatory Visit: Payer: Self-pay | Admitting: Internal Medicine

## 2021-11-27 DIAGNOSIS — I480 Paroxysmal atrial fibrillation: Secondary | ICD-10-CM

## 2021-11-27 NOTE — Telephone Encounter (Signed)
Prescription refill request for Eliquis received. Indication: Afib  Last office visit: 01/23/21 ([facility])  Scr: 1.23 (11/03/21)  Age: 81 Weight: 108.9kg  Appropriate dose and refill sent to requested pharmacy.

## 2021-11-30 ENCOUNTER — Other Ambulatory Visit: Payer: Self-pay | Admitting: Physician Assistant

## 2021-11-30 DIAGNOSIS — I5023 Acute on chronic systolic (congestive) heart failure: Secondary | ICD-10-CM

## 2021-11-30 DIAGNOSIS — I10 Essential (primary) hypertension: Secondary | ICD-10-CM

## 2021-11-30 DIAGNOSIS — I420 Dilated cardiomyopathy: Secondary | ICD-10-CM

## 2021-12-08 ENCOUNTER — Ambulatory Visit (INDEPENDENT_AMBULATORY_CARE_PROVIDER_SITE_OTHER): Payer: Medicare Other

## 2021-12-08 DIAGNOSIS — I5022 Chronic systolic (congestive) heart failure: Secondary | ICD-10-CM | POA: Diagnosis not present

## 2021-12-08 DIAGNOSIS — Z95810 Presence of automatic (implantable) cardiac defibrillator: Secondary | ICD-10-CM | POA: Diagnosis not present

## 2021-12-12 ENCOUNTER — Encounter: Payer: Medicare Other | Admitting: Internal Medicine

## 2021-12-12 ENCOUNTER — Telehealth: Payer: Self-pay

## 2021-12-12 NOTE — Progress Notes (Signed)
EPIC Encounter for ICM Monitoring  Patient Name: Earl Trimpe Sr. is a 81 y.o. male Date: 12/12/2021 Primary Care Physican: Lavada Mesi Primary Cardiologist: [name] Electrophysiologist: Allred Bi-V Pacing:  96% 07/23/2021 Weight: 235 lbs  10/03/2021 Weight: 235 lbs   AT/AF Burden 0%            Attempted call to patient and unable to reach. Transmission reviewed.    CorVue thoracic impedance suggesting normal fluid levels.    Prescribed:   Furosemide 40 mg 1 tablet (40 mg total) daily.    Spironolactone 25 mg take 0.5 tablet (12.5 mg total) daily Eliquis 5 mg take 1 tablet twice a day.   Labs: 11/03/2021 Creatinine 1.23, BUN 18, Potassium 4.3, Sodium 143, GFR 59 07/25/2021 Creatinine 1.25, BUN 27, Potassium 4.1, Sodium 143, GFR 58  A complete set of results can be found in Results Review.   Recommendations:  Unable to reach.       Follow-up plan: ICM clinic phone appointment on 01/12/2022.   91 day device clinic remote transmission 01/23/2022.     EP/Cardiology Office Visits:  12/12/2021 with Dr Rayann Heman.  Recall 08/05/2021 with Oda Kilts, PA.   Copy of ICM check sent to Dr. Rayann Heman.    3 month ICM trend: 12/08/2021.    12-14 Month ICM trend:     Rosalene Billings, RN 12/12/2021 3:51 PM

## 2021-12-12 NOTE — Telephone Encounter (Signed)
Remote ICM transmission received.  Attempted call to patient regarding ICM remote transmission and no answer or voice mail message.   

## 2021-12-16 ENCOUNTER — Other Ambulatory Visit: Payer: Self-pay | Admitting: Physician Assistant

## 2021-12-16 DIAGNOSIS — E1122 Type 2 diabetes mellitus with diabetic chronic kidney disease: Secondary | ICD-10-CM

## 2021-12-18 ENCOUNTER — Institutional Professional Consult (permissible substitution): Payer: Medicare Other | Admitting: Student

## 2021-12-18 ENCOUNTER — Emergency Department (INDEPENDENT_AMBULATORY_CARE_PROVIDER_SITE_OTHER)
Admission: EM | Admit: 2021-12-18 | Discharge: 2021-12-18 | Disposition: A | Discharge status: Home / Self Care | Payer: Medicare Other | Source: Home / Self Care

## 2021-12-18 ENCOUNTER — Emergency Department (INDEPENDENT_AMBULATORY_CARE_PROVIDER_SITE_OTHER): Payer: Medicare Other

## 2021-12-18 DIAGNOSIS — R079 Chest pain, unspecified: Secondary | ICD-10-CM | POA: Diagnosis not present

## 2021-12-18 DIAGNOSIS — U071 COVID-19: Secondary | ICD-10-CM | POA: Diagnosis not present

## 2021-12-18 DIAGNOSIS — R0989 Other specified symptoms and signs involving the circulatory and respiratory systems: Secondary | ICD-10-CM | POA: Diagnosis not present

## 2021-12-18 DIAGNOSIS — J811 Chronic pulmonary edema: Secondary | ICD-10-CM | POA: Diagnosis not present

## 2021-12-18 DIAGNOSIS — R059 Cough, unspecified: Secondary | ICD-10-CM | POA: Diagnosis not present

## 2021-12-18 DIAGNOSIS — R509 Fever, unspecified: Secondary | ICD-10-CM | POA: Diagnosis not present

## 2021-12-18 LAB — POC SARS CORONAVIRUS 2 AG -  ED: SARS Coronavirus 2 Ag: POSITIVE — AB

## 2021-12-18 LAB — POCT INFLUENZA A/B
Influenza A, POC: NEGATIVE
Influenza B, POC: NEGATIVE

## 2021-12-18 MED ORDER — NIRMATRELVIR/RITONAVIR (PAXLOVID)TABLET: 3.0000 | ORAL_TABLET | Freq: Two times a day (BID) | ORAL | 0 refills | Status: AC

## 2021-12-18 MED ORDER — BENZONATATE 200 MG PO CAPS: 200.0000 mg | ORAL_CAPSULE | Freq: Three times a day (TID) | ORAL | 0 refills | Status: AC | PRN

## 2021-12-18 MED ORDER — HYDROCODONE BIT-HOMATROP MBR 5-1.5 MG/5ML PO SOLN: 5.0000 mL | Freq: Four times a day (QID) | ORAL | 0 refills | Status: DC | PRN

## 2021-12-18 NOTE — ED Triage Notes (Addendum)
Pt presents to Urgent Care with c/o sore throat, cough, and nasal congestion x 3 days. Low-grade fever reported. Has not done COVID test. Pt also c/o rib/chest pain when coughing.

## 2021-12-18 NOTE — ED Provider Notes (Signed)
[facility]    CSN: [id number] Arrival date & time: 12/18/21  1517      History   Chief Complaint Chief Complaint  Patient presents with   Sore Throat   Cough   Nasal Congestion    HPI Earl White Sr. is a 81 y.o. White.   HPI Earl 81 year old White presents with cough, nasal congestion, sore throat x 3 days.  Patient reports rib/chest pain when coughing.  PMH significant for paroxysmal atrial fibrillation, congestive dilated cardiomyopathy s/p ICD, prostate cancer, and T2DM with chronic kidney disease without current or long-term insulin use.  Patient is currently on Apixaban and denies any unusual bleeding.  Past Medical History:  Diagnosis Date   BPH (benign prostatic hyperplasia)    CHF (congestive heart failure) ([place])    Diabetes mellitus ([place])    Type II. Diet controlled   Family history of breast cancer    Family history of prostate cancer    Gout    Hyperlipidemia    Hypertension    Nonischemic cardiomyopathy ([place])    a. 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   Rectal bleeding 12/11/2019   Mixed hyperlipidemia 12/11/2019   Impetigo 08/18/2019   Ventricular fibrillation ([place]) 08/16/2019   Fall 02/58/5277   Chronic systolic heart failure ([place]) 06/08/2019   Syncope and collapse 05/24/2019   Acute left-sided low back pain without sciatica 05/16/2019   History of gout 08/01/2018   Type 2 diabetes mellitus with chronic kidney disease, 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[place]) 08/01/2018   Hypotension 05/23/2018   Genetic testing 03/25/2018   Family history of prostate cancer    Family  history of breast cancer    Malignant neoplasm of prostate ([place]) 02/03/2018   Family history of cancer 02/03/2018   Lower extremity edema 09/29/2017   Abnormal ultrasound of lower extremity 09/29/2017   Paroxysmal atrial fibrillation ([place]) 12/25/2015   Erectile dysfunction 82/42/3536   Systolic dysfunction with acute on chronic heart failure ([place]) 09/03/2014   Morbid obesity ([place]) 09/03/2014   Elevated PSA 11/24/2013   Congestive dilated cardiomyopathy ([place]) 05/10/2013   ICD (implantable cardioverter-defibrillator) in place 05/10/2013   Essential hypertension, benign 04/28/2013   BPH (benign prostatic hyperplasia) 04/28/2013   Gout 04/28/2013    Past Surgical History:  Procedure Laterality Date   BIV ICD GENERATOR CHANGEOUT N/A 10/22/2020   Procedure: BIV ICD GENERATOR CHANGEOUT;  Surgeon: Thompson Grayer, [place];  Location: [place] [facility];  Service: Cardiovascular;  Laterality: N/A;   CARDIAC DEFIBRILLATOR PLACEMENT  Jan 2014   SJM Quadra Assura implanted by Dr [name] in [place] Medications    Prior to Admission medications   Medication Sig Start Date End Date Taking? Authorizing Provider  benzonatate (TESSALON) 200 MG capsule Take 1 capsule (200 mg total) by mouth 3 (three) times daily as needed for up to 7 days for cough. 12/18/21 12/25/21 Yes Eliezer Lofts, FNP  HYDROcodone bit-homatropine (HYCODAN) 5-1.5 MG/5ML syrup Take 5 mLs by mouth every 6 (six) hours as needed for cough. 12/18/21  Yes Eliezer Lofts, FNP  nirmatrelvir/ritonavir EUA (PAXLOVID) 20  x 150 MG & 10 x '100MG'$  TABS Take 3 tablets by mouth 2 (two) times daily for 5 days. Patient GFR is 59. Take nirmatrelvir (150 mg) two tablets twice daily for 5 days and ritonavir (100 mg) one tablet twice daily for 5 days. 12/18/21 12/23/21 Yes Eliezer Lofts, FNP  apixaban (ELIQUIS) 5 MG TABS tablet Take 1 tablet by mouth twice daily 11/27/21   Allred, Jeneen Rinks, [place]  atorvastatin (LIPITOR) 80 MG tablet TAKE 1 TABLET BY  MOUTH ONCE DAILY. 07/25/21   Breeback, Jade L, PA-C  carvedilol (COREG) 25 MG tablet TAKE 1 TABLET BY MOUTH TWICE DAILY WITH MEALS . 06/24/21   Lelon Perla, MD  Empagliflozin-metFORMIN HCl ER (SYNJARDY XR) 03-999 MG TB24 Take 1 tablet by mouth daily. 11/04/21   Donella Stade, PA-C  ENTRESTO 97-103 MG Take 1 tablet by mouth twice daily 12/09/20   Allred, Jeneen Rinks, MD  furosemide (LASIX) 40 MG tablet Take 1 tablet by mouth once daily 12/01/21   Breeback, Jade L, PA-C  sennosides-docusate sodium (SENOKOT-S) 8.6-50 MG tablet Take 1 tablet by mouth daily as needed. For constipation    [provider]  sildenafil (VIAGRA) 100 MG tablet TAKE 1 TABLET BY MOUTH AS NEEDED FOR  ERECTILE  DYSFUNCTION 11/08/19   Early, Coralee Pesa, NP  spironolactone (ALDACTONE) 25 MG tablet Take 1 tablet by mouth once daily 11/21/21   Breeback, Jade L, PA-C  TRULICITY 1.5 VH/8.4ON SOPN INJECT 1.5 MG (0.5ML)  SUBCUTANEOUSLY ONCE A WEEK 12/16/21   Breeback, Jade L, PA-C  Vitamin D, Ergocalciferol, (DRISDOL) 1.25 MG (50000 UNIT) CAPS capsule Take 1 capsule (50,000 Units total) by mouth every 7 (seven) days. 11/04/21   Donella Stade, PA-C    Family History Family History  Problem Relation Age of Onset   Diabetes Mother    Hyperlipidemia Mother    Heart attack Brother    Breast cancer Sister 24   Prostate cancer Maternal Uncle    Prostate cancer Cousin        [name] vet, agent orange exposure   Prostate cancer Cousin        dx 28-30 yrs. - mat first cousin's son   Prostate cancer Cousin    Prostate cancer Cousin    Diabetes Brother    Throat cancer Brother 44       [employer] [employer], ? due to agent orange exposure    Social History Social History   Tobacco Use   Smoking status: Never   Smokeless tobacco: Never  Vaping Use   Vaping Use: Never used  Substance Use Topics   Alcohol use: Yes    Comment: Occasional   Drug use: No     Allergies   Patient has no known allergies.   Review of Systems Review  of Systems  HENT:  Positive for congestion and sore throat.   Respiratory:  Positive for cough.   All other systems reviewed and are negative.    Physical Exam Triage Vital Signs ED Triage Vitals  Enc Vitals Group     BP 12/18/21 1549 128/68     Pulse Rate 12/18/21 1549 63     Resp 12/18/21 1549 20     Temp 12/18/21 1549 99.1 F (37.3 C)     Temp Source 12/18/21 1549 Oral     SpO2 12/18/21 1549 92 %     Weight 12/18/21 1546 240 lb (108.9 kg)     Height 12/18/21 1546 '5\' 10"'$  (1.778 m)  Head Circumference --      Peak Flow --      Pain Score 12/18/21 1546 7     Pain Loc --      Pain Edu? --      Excl. in [place]? --    No data found.  Updated Vital Signs BP 128/68 (BP Location: Right Arm)   Pulse 63   Temp 99.1 F (37.3 C) (Oral)   Resp 20   Ht '5\' 10"'$  (1.778 m)   Wt 240 lb (108.9 kg)   SpO2 92%   BMI 34.44 kg/m       Physical Exam Vitals and nursing note reviewed.  Constitutional:      General: He is not in acute distress.    Appearance: Normal appearance. He is obese. He is not ill-appearing, toxic-appearing or diaphoretic.  HENT:     Head: Normocephalic and atraumatic.     Right Ear: Tympanic membrane and external ear normal.     Left Ear: Tympanic membrane and external ear normal.     Mouth/Throat:     Mouth: Mucous membranes are moist.     Pharynx: Oropharynx is clear.  Eyes:     Extraocular Movements: Extraocular movements intact.     Conjunctiva/sclera: Conjunctivae normal.     Pupils: Pupils are equal, round, and reactive to light.  Cardiovascular:     Rate and Rhythm: Normal rate and regular rhythm.     Pulses: Normal pulses.     Heart sounds: Normal heart sounds. No murmur heard.    Comments: ICD in place Pulmonary:     Effort: Pulmonary effort is normal.     Breath sounds: No wheezing, rhonchi or rales.     Comments: Diminished breath sounds noted throughout Chest:     Chest wall: No tenderness.  Musculoskeletal:     Cervical back: Normal  range of motion and neck supple. No tenderness.  Lymphadenopathy:     Cervical: No cervical adenopathy.  Skin:    General: Skin is warm and dry.  Neurological:     General: No focal deficit present.     Mental Status: He is alert and oriented to person, place, and time. Mental status is at baseline.      UC Treatments / Results  Labs (all labs ordered are listed, but only abnormal results are displayed) Labs Reviewed  POC SARS CORONAVIRUS 2 AG -  ED - Abnormal; Notable for the following components:      Result Value   SARS Coronavirus 2 Ag Positive (*)    All other components within normal limits  POCT INFLUENZA A/B    EKG   Radiology DG Chest 2 View  Result Date: 12/18/2021 CLINICAL DATA:  cough, nasal congestion and chest pain. Low-grade fever. EXAM: CHEST - 2 VIEW COMPARISON:  April 24, 2021 FINDINGS: Again seen is the dual lead left subclavian pacemaker, stable. Moderate cardiomegaly. Moderate central pulmonary vascular congestion without significant interval change. No consolidation, pleural effusion or pneumothorax. Moderate thoracic spondylosis. IMPRESSION: Moderate cardiomegaly and pulmonary vascular congestion without significant interval change. Electronically Signed   By: Frazier Richards M.D.   On: 12/18/2021 16:13    Procedures Procedures (including critical care time)  Medications Ordered in UC Medications - No data to display  Initial Impression / Assessment and Plan / UC Course  I have reviewed the triage vital signs and the nursing notes.  Pertinent labs & imaging results that were available during my care of the patient were reviewed  by me and considered in my medical decision making (see chart for details).     MDM: 1.  COVID-19-Rx'd Paxlovid; 2. Cough-CXR revealed above Rx'd Hycodan, Tessalon Perles. Instructed patient to take medication as directed with food to completion.  Advised patient may take Tessalon Perles daily or as needed for cough.  Advised  may use Hycodan cough syrup prior to sleep due to sedative effects.  Instructed patient to remain self quarantine for the next 5 days or until Tuesday, 12/23/2021.  Advised patient if symptoms worsen and/or unresolved please follow-up with PCP or here for further evaluation.  Discharged home, hemodynamically stable.  Final Clinical Impressions(s) / UC Diagnoses   Final diagnoses:  Cough, unspecified type  COVID-19     Discharge Instructions      Instructed patient not to use Viagra while taking Paxlovid.  Instructed patient to take medication as directed with food to completion.  Advised patient may take Tessalon Perles daily or as needed for cough.  Advised may use Hycodan cough syrup prior to sleep due to sedative effects.  Instructed patient to remain self quarantine for the next 5 days or until Tuesday, 12/23/2021.  Advised patient if symptoms worsen and/or unresolved please follow-up with PCP or here for further evaluation.     ED Prescriptions     Medication Sig Dispense Auth. Provider   nirmatrelvir/ritonavir EUA (PAXLOVID) 20 x 150 MG & 10 x '100MG'$  TABS Take 3 tablets by mouth 2 (two) times daily for 5 days. Patient GFR is 59. Take nirmatrelvir (150 mg) two tablets twice daily for 5 days and ritonavir (100 mg) one tablet twice daily for 5 days. 30 tablet Eliezer Lofts, FNP   benzonatate (TESSALON) 200 MG capsule Take 1 capsule (200 mg total) by mouth 3 (three) times daily as needed for up to 7 days for cough. 40 capsule Eliezer Lofts, FNP   HYDROcodone bit-homatropine (HYCODAN) 5-1.5 MG/5ML syrup Take 5 mLs by mouth every 6 (six) hours as needed for cough. 120 mL Eliezer Lofts, FNP      I have reviewed the PDMP during this encounter.   Eliezer Lofts, [place] 12/18/21 [phone]

## 2021-12-18 NOTE — Discharge Instructions (Addendum)
Instructed patient not to use Viagra while taking Paxlovid.  Instructed patient to take medication as directed with food to completion.  Advised patient may take Tessalon Perles daily or as needed for cough.  Advised may use Hycodan cough syrup prior to sleep due to sedative effects.  Instructed patient to remain self quarantine for the next 5 days or until Tuesday, 12/23/2021.  Advised patient if symptoms worsen and/or unresolved please follow-up with PCP or here for further evaluation.

## 2021-12-20 ENCOUNTER — Other Ambulatory Visit: Payer: Self-pay | Admitting: Internal Medicine

## 2021-12-20 ENCOUNTER — Other Ambulatory Visit: Payer: Self-pay | Admitting: Cardiology

## 2021-12-20 DIAGNOSIS — I420 Dilated cardiomyopathy: Secondary | ICD-10-CM

## 2021-12-20 DIAGNOSIS — I10 Essential (primary) hypertension: Secondary | ICD-10-CM

## 2021-12-20 DIAGNOSIS — I5023 Acute on chronic systolic (congestive) heart failure: Secondary | ICD-10-CM

## 2021-12-23 ENCOUNTER — Encounter: Payer: Self-pay | Admitting: Student

## 2021-12-23 ENCOUNTER — Ambulatory Visit (INDEPENDENT_AMBULATORY_CARE_PROVIDER_SITE_OTHER): Payer: Medicare Other | Admitting: Student

## 2021-12-23 VITALS — BP 128/72 | HR 64 | Temp 98.2°F | Ht 70.0 in | Wt 234.0 lb

## 2021-12-23 DIAGNOSIS — R0609 Other forms of dyspnea: Secondary | ICD-10-CM

## 2021-12-23 DIAGNOSIS — G4733 Obstructive sleep apnea (adult) (pediatric): Secondary | ICD-10-CM

## 2021-12-23 NOTE — Progress Notes (Signed)
Synopsis: Referred for dyspnea by Jomarie Longs, PA-C  Subjective:   PATIENT ID: Earl Real Sr. GENDER: male DOB: May 24, 1941, MRN: [id number]  Chief Complaint  Patient presents with   Pulmonary Consult    Referred by Edger House, PA for eval of ct chest. Pt c/o SOB and lack of energy for the past 6 months.     81yM with history of HFrEF due to NICM, pAF, VF, prostate ca, obesity, restrictive lung disease referred for dyspnea  Dyspnea, lack of energy over last 6 months. Weight is actually down a bit. He has no orthopnea. A month ago he had some BLE swelling - took an extra half dose of lasix for a while and that took care of the swelling - this didn't have an effect on his dyspnea. He doesn't really have much of a cough. He snores. He doesn't have PND. He has had no witnessed apneas. He had a sleep study 20 ya and did have OSA. He doesn't feel super sleepy during the day.   He tested positive for covid-19 12/18/21.    No history of asthma as a kid.   Otherwise pertinent review of systems is negative.  He has no family history of lung disease  No smoking, vaping, MJ. Worked in [employer], was Chartered loss adjuster. Has lived in [place], [place]. He has no pets at home.  Past Medical History:  Diagnosis Date   BPH (benign prostatic hyperplasia)    CHF (congestive heart failure) ([facility])    Diabetes mellitus ([facility])    Type II. Diet controlled   Family history of breast cancer    Family history of prostate cancer    Gout    Hyperlipidemia    Hypertension    Nonischemic cardiomyopathy ([facility])    a. s/p STJ CRTD   Paroxysmal atrial fibrillation ([facility]) 12/25/15   Prostate cancer ([facility])      Family History  Problem Relation Age of Onset   Diabetes Mother    Hyperlipidemia Mother    Emphysema Father        smoked pipe   Breast cancer Sister 50   Heart attack Brother    Diabetes Brother    Throat cancer Brother 41       [employer], ? due to agent orange exposure   Prostate cancer  Maternal Uncle    Prostate cancer Cousin        [place] [employer] vet, agent orange exposure   Prostate cancer Cousin        dx 28-30 yrs. - mat first cousin's son   Prostate cancer Cousin    Prostate cancer Cousin      Past Surgical History:  Procedure Laterality Date   BIV ICD GENERATOR CHANGEOUT N/A 10/22/2020   Procedure: BIV ICD GENERATOR CHANGEOUT;  Surgeon: Hillis Range, [place];  Location: [facility];  Service: Cardiovascular;  Laterality: N/A;   CARDIAC DEFIBRILLATOR PLACEMENT  Jan 2014   SJM Quadra Assura implanted by Dr Randel Books in [place]    Social History   Socioeconomic History   Marital status: Widowed    Spouse name: Not on file   Number of children: 1   Years of education: Bachelor's degree`   Highest education level: Bachelor's degree (e.g., BA, AB, BS)  Occupational History   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 He has one child and one grandson. They live close by and visit often. He enjoys sports and travelling.   Social Determinants of Health   Financial Resource Strain: Low Risk  (03/24/2021)   Overall Financial Resource Strain (CARDIA)    Difficulty of Paying Living Expenses: Not hard at all  Food Insecurity: No Food Insecurity (03/24/2021)   Hunger Vital Sign    Worried About Running Out of Food in the Last Year: Never true    Ran Out of Food in the Last Year: Never true  Transportation Needs: No Transportation Needs (03/24/2021)   PRAPARE - Administrator, Civil Service (Medical): No    Lack of Transportation (Non-Medical): No  Physical Activity: Insufficiently Active (03/24/2021)   Exercise Vital Sign    Days of Exercise per Week:  4 days    Minutes of Exercise per Session: 30 min  Stress: No Stress Concern Present (03/24/2021)   [employer] of Occupational Health - Occupational Stress Questionnaire    Feeling of Stress : Not at all  Social Connections: Moderately Isolated (03/24/2021)   Social Connection and Isolation Panel [NHANES]    Frequency of Communication with Friends and Family: More than three times a week    Frequency of Social Gatherings with Friends and Family: Twice a week    Attends Religious Services: More than 4 times per year    Active Member of [employer] or Organizations: No    Attends Banker Meetings: Never    Marital Status: Widowed  Intimate Partner Violence: Not At Risk (03/24/2021)   Humiliation, Afraid, Rape, and Kick questionnaire    Fear of Current or Ex-Partner: No    Emotionally Abused: No    Physically Abused: No    Sexually Abused: No     No Known Allergies   Outpatient Medications Prior to Visit  Medication Sig Dispense Refill   apixaban (ELIQUIS) 5 MG TABS tablet Take 1 tablet by mouth twice daily 180 tablet 1   atorvastatin (LIPITOR) 80 MG tablet TAKE 1 TABLET BY MOUTH ONCE DAILY. 90 tablet 3   benzonatate (TESSALON) 200 MG capsule Take 1 capsule (200 mg total) by mouth 3 (three) times daily as needed for up to 7 days for cough. 40 capsule 0   carvedilol (COREG) 25 MG tablet TAKE 1 TABLET BY MOUTH TWICE DAILY WITH MEALS 180 tablet 0   Empagliflozin-metFORMIN HCl ER (SYNJARDY XR) 03-999 MG TB24 Take 1 tablet by mouth daily. 90 tablet 3   ENTRESTO 97-103 MG Take 1 tablet by mouth twice daily 60 tablet 0   furosemide (LASIX) 40 MG tablet Take 1 tablet by mouth once daily 90 tablet 0   HYDROcodone bit-homatropine (HYCODAN) 5-1.5 MG/5ML syrup Take 5 mLs by mouth every 6 (six) hours as needed for cough. 120 mL 0   nirmatrelvir/ritonavir EUA (PAXLOVID) 20 x 150 MG & 10 x 100MG  TABS Take 3 tablets by mouth 2 (two) times daily for 5 days. Patient GFR is 59. Take  nirmatrelvir (150 mg) two tablets twice daily for 5 days and ritonavir (100 mg) one tablet twice daily for 5 days. 30 tablet 0   sennosides-docusate sodium (SENOKOT-S) 8.6-50 MG tablet Take 1 tablet by mouth daily as needed. For constipation  spironolactone (ALDACTONE) 25 MG tablet Take 1 tablet by mouth once daily 90 tablet 0   TRULICITY 1.5 MG/0.5ML SOPN INJECT 1.5 MG (0.5ML)  SUBCUTANEOUSLY ONCE A WEEK 12 mL 0   Vitamin D, Ergocalciferol, (DRISDOL) 1.25 MG (50000 UNIT) CAPS capsule Take 1 capsule (50,000 Units total) by mouth every 7 (seven) days. 12 capsule 3   sildenafil (VIAGRA) 100 MG tablet TAKE 1 TABLET BY MOUTH AS NEEDED FOR  ERECTILE  DYSFUNCTION 10 tablet 2   No facility-administered medications prior to visit.       Objective:   Physical Exam:  General appearance: 81 y.o., male, NAD, conversant  Eyes: anicteric sclerae; PERRL, tracking appropriately HENT: NCAT; MMM Neck: Trachea midline; no lymphadenopathy, no JVD Lungs: CTAB, no crackles, no wheeze, with normal respiratory effort CV: RRR, no murmur  Abdomen: Soft, non-tender; non-distended, BS present  Extremities: No peripheral edema, warm Skin: Normal turgor and texture; no rash Psych: Appropriate affect Neuro: Alert and oriented to person and place, no focal deficit     Vitals:   12/23/21 1557  BP: 128/72  Pulse: 64  Temp: 98.2 F (36.8 C)  TempSrc: Oral  SpO2: 95%  Weight: 234 lb (106.1 kg)  Height: 5\' 10"  (1.778 m)   95% on RA BMI Readings from Last 3 Encounters:  12/23/21 33.58 kg/m  12/18/21 34.44 kg/m  11/17/21 34.44 kg/m   Wt Readings from Last 3 Encounters:  12/23/21 234 lb (106.1 kg)  12/18/21 240 lb (108.9 kg)  11/17/21 240 lb (108.9 kg)     CBC    Component Value Date/Time   WBC 5.0 11/03/2021 0000   RBC 5.00 11/03/2021 0000   HGB 15.0 11/03/2021 0000   HGB 15.3 10/11/2020 1228   HCT 46.0 11/03/2021 0000   HCT 48.0 10/11/2020 1228   PLT 213 11/03/2021 0000   PLT 208  10/11/2020 1228   MCV 92.0 11/03/2021 0000   MCV 90 10/11/2020 1228   MCH 30.0 11/03/2021 0000   MCHC 32.6 11/03/2021 0000   RDW 14.4 11/03/2021 0000   RDW 15.3 10/11/2020 1228   LYMPHSABS 1,490 11/03/2021 0000   LYMPHSABS 1.9 10/11/2020 1228   EOSABS 160 11/03/2021 0000   EOSABS 0.2 10/11/2020 1228   BASOSABS 40 11/03/2021 0000   BASOSABS 0.1 10/11/2020 1228     Chest Imaging: CT Chest 05/09/21 reviewed by me with minor amount of scar in lower lobes, enlarged PA trunk, cardiomegaly  Pulmonary Functions Testing Results:     No data to display         Cleda Daub 11/17/21 reviewed by me with FVC 57%, normal ratio reportedly  Echocardiogram:   TTE 08/05/20:  1. Left ventricular ejection fraction, by estimation, is 25 to 30%. The  left ventricle has severely decreased function. The left ventricle  demonstrates global hypokinesis. The left ventricular internal cavity size  was moderately dilated. Left  ventricular diastolic parameters are consistent with Grade II diastolic  dysfunction (pseudonormalization).   2. Mild to moderate aortic stenosis is present. V max 2.4 m/s, MG 12  mmHG, AVA 1.50 cm2, DI 0.33. The aortic valve is tricuspid. There is mild  calcification of the aortic valve. There is mild thickening of the aortic  valve. Aortic valve regurgitation is   not visualized. Mild to moderate aortic valve stenosis.   3. Right ventricular systolic function is normal. The right ventricular  size is mildly enlarged. Tricuspid regurgitation signal is inadequate for  assessing PA pressure.   4. Left atrial size  was moderately dilated.   5. Right atrial size was mildly dilated.   6. The mitral valve is grossly normal. No evidence of mitral valve  regurgitation. No evidence of mitral stenosis.   7. The inferior vena cava is normal in size with greater than 50%  respiratory variability, suggesting right atrial pressure of 3 mmHg.      Assessment & Plan:   # DOE  Deconditioning,  untreated OSA, CHF seem like most likely issues. Does have spiro suggestive of restrictive defect but this could be due to body habitus and cardiomegaly.  # OSA not on CPAP  # 'twinge' over ICD generator  # Covid-19 infection Finished antiviral  Plan: - PFT with lung volumes and DLCO - in-lab split night sleep study - will reach out to cardiology to see if he needs device interrogation, ED precautions given   RTC 6 weeks   Omar Person, MD [facility] 12/23/2021 4:06 PM

## 2022-01-02 ENCOUNTER — Telehealth: Payer: Self-pay

## 2022-01-02 NOTE — Telephone Encounter (Signed)
-----   Message from Damian Leavell, RN sent at 01/02/2022  9:41 AM EDT ----- Regarding: RE: pt may need device interrogation I'm training in clinic today, but I will see if Truddie Crumble can give him a call. Sonia Baller  ----- Message ----- From: Thompson Grayer, MD Sent: 01/02/2022   9:39 AM EDT To: Damian Leavell, RN Subject: FW: pt may need device interrogation           Can you call and check on him?  Thanks!   ----- Message ----- From: Maryjane Hurter, MD Sent: 12/23/2021   4:31 PM EDT To: Thompson Grayer, MD Subject: pt may need device interrogation               Felt a 'twinge' near generator a couple times on this past Friday and Saturday. No presyncope/palpitations. Told him I'd send you a message to see if you guys can interrogate device.   Thanks!  Nate

## 2022-01-02 NOTE — Telephone Encounter (Signed)
Successful telephone encounter to patient to assess for c/o a "twinge" near ICD site. According to patient he felt a "slight vibration" a couple of times near his ICD on June 26 and 27. No recurrence to date. Manual transmission received 7/3 indicates normal device function. NSVT episodes previously noted on prior remote transmission. Of note patient has 6 months follow up with JA 01/07/22. Confirmed appointment with patient. Patient will contact device clinic at [phone] if vibratory sensation reoccurs.

## 2022-01-07 ENCOUNTER — Encounter: Payer: Medicare Other | Admitting: Internal Medicine

## 2022-01-07 ENCOUNTER — Ambulatory Visit (INDEPENDENT_AMBULATORY_CARE_PROVIDER_SITE_OTHER): Payer: Medicare Other | Admitting: Internal Medicine

## 2022-01-07 ENCOUNTER — Encounter: Payer: Self-pay | Admitting: Internal Medicine

## 2022-01-07 VITALS — BP 130/70 | HR 64 | Ht 70.0 in | Wt 238.7 lb

## 2022-01-07 DIAGNOSIS — I472 Ventricular tachycardia, unspecified: Secondary | ICD-10-CM

## 2022-01-07 DIAGNOSIS — Z95810 Presence of automatic (implantable) cardiac defibrillator: Secondary | ICD-10-CM

## 2022-01-07 DIAGNOSIS — I480 Paroxysmal atrial fibrillation: Secondary | ICD-10-CM | POA: Diagnosis not present

## 2022-01-07 DIAGNOSIS — I5022 Chronic systolic (congestive) heart failure: Secondary | ICD-10-CM | POA: Diagnosis not present

## 2022-01-07 DIAGNOSIS — I10 Essential (primary) hypertension: Secondary | ICD-10-CM

## 2022-01-07 DIAGNOSIS — I255 Ischemic cardiomyopathy: Secondary | ICD-10-CM | POA: Diagnosis not present

## 2022-01-07 NOTE — Progress Notes (Signed)
PCP: Donella Stade, PA-C   Primary EP: Dr Annamarie Major Sr. is a 81 y.o. male who presents today for routine electrophysiology followup.  Since last being seen in our clinic, the patient reports doing reasonably well.   He has occasional fatigue and weakness. Today, he denies symptoms of palpitations, chest pain, shortness of breath,  lower extremity edema, dizziness, presyncope, syncope, or ICD shocks.  The patient is otherwise without complaint today.   Past Medical History:  Diagnosis Date   BPH (benign prostatic hyperplasia)    CHF (congestive heart failure) ([place])    Diabetes mellitus ([place])    Type II. Diet controlled   Family history of breast cancer    Family history of prostate cancer    Gout    Hyperlipidemia    Hypertension    Nonischemic cardiomyopathy ([place])    a. s/p STJ CRTD   Paroxysmal atrial fibrillation ([place]) 12/25/15   Prostate cancer [facility])    Past Surgical History:  Procedure Laterality Date   BIV ICD GENERATOR CHANGEOUT N/A 10/22/2020   Procedure: BIV ICD GENERATOR CHANGEOUT;  Surgeon: Thompson Grayer, MD;  Location: [facility];  Service: Cardiovascular;  Laterality: N/A;   CARDIAC DEFIBRILLATOR PLACEMENT  Jan 2014   SJM Quadra Assura implanted by Dr [name] in [place] are reviewed and negative except as per HPI above  Current Outpatient Medications  Medication Sig Dispense Refill   apixaban (ELIQUIS) 5 MG TABS tablet Take 1 tablet by mouth twice daily 180 tablet 1   atorvastatin (LIPITOR) 80 MG tablet TAKE 1 TABLET BY MOUTH ONCE DAILY. 90 tablet 3   carvedilol (COREG) 25 MG tablet TAKE 1 TABLET BY MOUTH TWICE DAILY WITH MEALS 180 tablet 0   Empagliflozin-metFORMIN HCl ER (SYNJARDY XR) 03-999 MG TB24 Take 1 tablet by mouth daily. 90 tablet 3   ENTRESTO 97-103 MG Take 1 tablet by mouth twice daily 60 tablet 0   furosemide (LASIX) 40 MG tablet Take 1 tablet by mouth once daily 90 tablet 0   HYDROcodone  bit-homatropine (HYCODAN) 5-1.5 MG/5ML syrup Take 5 mLs by mouth every 6 (six) hours as needed for cough. 120 mL 0   sennosides-docusate sodium (SENOKOT-S) 8.6-50 MG tablet Take 1 tablet by mouth daily as needed. For constipation     spironolactone (ALDACTONE) 25 MG tablet Take 1 tablet by mouth once daily 90 tablet 0   TRULICITY 1.5 ME/2.6ST SOPN INJECT 1.5 MG (0.5ML)  SUBCUTANEOUSLY ONCE A WEEK 12 mL 0   Vitamin D, Ergocalciferol, (DRISDOL) 1.25 MG (50000 UNIT) CAPS capsule Take 1 capsule (50,000 Units total) by mouth every 7 (seven) days. 12 capsule 3   No current facility-administered medications for this visit.    Physical Exam: Vitals:   01/07/22 1430  BP: 130/70  Pulse: 64  SpO2: 95%  Weight: 238 lb 11 oz (108.3 kg)  Height: '5\' 10"'$  (1.778 m)    GEN- The patient is well appearing, alert and oriented x 3 today.   Head- normocephalic, atraumatic Eyes-  Sclera clear, conjunctiva pink Ears- hearing intact Oropharynx- clear Lungs- Clear to ausculation bilaterally, normal work of breathing Chest- ICD pocket is well healed Heart- Regular rate and rhythm, no murmurs, rubs or gallops, PMI not laterally displaced GI- soft, NT, ND, + BS Extremities- no clubbing, cyanosis, or edema  ICD interrogation- reviewed in detail today,  See PACEART report  ekg tracing ordered today is personally reviewed and shows  atrial and biv Paced  Wt Readings from Last 3 Encounters:  01/07/22 238 lb 11 oz (108.3 kg)  12/23/21 234 lb (106.1 kg)  12/18/21 240 lb (108.9 kg)    Assessment and Plan:  1.  Chronic systolic dysfunction/ ischemic CM/ CAD euvolemic today No ischemic symptoms Stable on an appropriate medical regimen Normal ICD function See Pace Art report No changes today he  is not device dependant today followed in ICM device clinic Update echo Overdue to see Dr [name]  2. VT Well controlled Continue coreg  3. HTN Stable No change required today  4. Paroxysmal atrial  fibrillation Burden <1% On eliquis  Risks, benefits and potential toxicities for medications prescribed and/or refilled reviewed with patient today.   Return to see EP APP in 6 months Overdue to see Dr Sunnie Nielsen MD, [facility] 01/07/2022 2:39 PM

## 2022-01-07 NOTE — Patient Instructions (Addendum)
Medication Instructions:  Your physician recommends that you continue on your current medications as directed. Please refer to the Current Medication list given to you today. *If you need a refill on your cardiac medications before your next appointment, please call your pharmacy*  Lab Work: None ordered. If you have labs (blood work) drawn today and your tests are completely normal, you will receive your results only by: [place] (if you have MyChart) OR A paper copy in the mail If you have any lab test that is abnormal or we need to change your treatment, we will call you to review the results.  Testing/Procedures:  Please schedule Echocardiogram   Please schedule, patient is overdue for visit with Dr. [name] in [facility] Remote check BiV ICD 01/12/2022   Follow-Up: At [facility], you and your health needs are our priority.  As part of our continuing mission to provide you with exceptional heart care, we have created designated Provider Care Teams.  These Care Teams include your primary Cardiologist (physician) and Advanced Practice Providers (APPs -  Physician Assistants and Nurse Practitioners) who all work together to provide you with the care you need, when you need it.  Your next appointment:   Your physician wants you to follow-up in: 6 months with Lollie Marrow, PA-C  Tommye Standard, PA-C Legrand Como "[place]" Ney, [place] You will receive a reminder letter in the mail two months in advance. If you don't receive a letter, please call our office to schedule the follow-up appointment.   Important Information About Sugar

## 2022-01-12 ENCOUNTER — Ambulatory Visit (INDEPENDENT_AMBULATORY_CARE_PROVIDER_SITE_OTHER): Payer: Medicare Other

## 2022-01-12 DIAGNOSIS — Z95810 Presence of automatic (implantable) cardiac defibrillator: Secondary | ICD-10-CM

## 2022-01-12 DIAGNOSIS — I5022 Chronic systolic (congestive) heart failure: Secondary | ICD-10-CM | POA: Diagnosis not present

## 2022-01-13 NOTE — Progress Notes (Signed)
EPIC Encounter for ICM Monitoring  Patient Name: Earl Huot Sr. is a 81 y.o. male Date: 01/13/2022 Primary Care Physican: Lavada Mesi Primary Cardiologist: [name] Electrophysiologist: Allred Bi-V Pacing:  96% 07/23/2021 Weight: 235 lbs  10/03/2021 Weight: 235 lbs 01/13/2022 Weight: 238 lbs   AT/AF Burden 0%            Spoke with patient and heart failure questions reviewed.  Pt asymptomatic for fluid accumulation.  Reports feeling well at this time and voices no complaints.  He ate restaurant foods a few times in the last week.   CorVue thoracic impedance suggesting possible fluid accumulation starting 7/12 and returned to baseline 7/17.    Prescribed:   Furosemide 40 mg 1 tablet (40 mg total) daily.    Spironolactone 25 mg take 0.5 tablet (12.5 mg total) daily Eliquis 5 mg take 1 tablet twice a day.   Labs: 11/03/2021 Creatinine 1.23, BUN 18, Potassium 4.3, Sodium 143, GFR 59 07/25/2021 Creatinine 1.25, BUN 27, Potassium 4.1, Sodium 143, GFR 58  A complete set of results can be found in Results Review.   Recommendations:  Recommendation to limit salt intake.  Encouraged to call if experiencing any fluid symptoms.       Follow-up plan: ICM clinic phone appointment on 02/16/2022.   91 day device clinic remote transmission 01/23/2022.     EP/Cardiology Office Visits:  05/08/2022 with Dr [name].  Recall 01/03/2023 with Oda Kilts, PA.   Copy of ICM check sent to Dr. Rayann Heman.   3 month ICM trend: 01/12/2022.    12-14 Month ICM trend:     Rosalene Billings, RN 01/13/2022 9:14 AM

## 2022-01-21 ENCOUNTER — Other Ambulatory Visit: Payer: Self-pay | Admitting: Internal Medicine

## 2022-01-21 DIAGNOSIS — I420 Dilated cardiomyopathy: Secondary | ICD-10-CM

## 2022-01-23 ENCOUNTER — Ambulatory Visit (INDEPENDENT_AMBULATORY_CARE_PROVIDER_SITE_OTHER): Payer: Medicare Other

## 2022-01-23 DIAGNOSIS — I255 Ischemic cardiomyopathy: Secondary | ICD-10-CM

## 2022-01-23 LAB — CUP PACEART REMOTE DEVICE CHECK
Battery Remaining Longevity: 79 mo
Battery Remaining Percentage: 83 %
Battery Voltage: 2.99 V
Brady Statistic AP VP Percent: 94 %
Brady Statistic AP VS Percent: 2 %
Brady Statistic AS VP Percent: 1.4 %
Brady Statistic AS VS Percent: 1.2 %
Brady Statistic RA Percent Paced: 95 %
Date Time Interrogation Session: 20230728020056
HighPow Impedance: 42 Ohm
Implantable Lead Implant Date: 20140117
Implantable Lead Implant Date: 20140117
Implantable Lead Implant Date: 20140117
Implantable Lead Location: 753858
Implantable Lead Location: 753859
Implantable Lead Location: 753860
Implantable Pulse Generator Implant Date: 20220426
Lead Channel Impedance Value: 400 Ohm
Lead Channel Impedance Value: 410 Ohm
Lead Channel Impedance Value: 940 Ohm
Lead Channel Pacing Threshold Amplitude: 0.625 V
Lead Channel Pacing Threshold Amplitude: 0.875 V
Lead Channel Pacing Threshold Amplitude: 1.125 V
Lead Channel Pacing Threshold Pulse Width: 0.5 ms
Lead Channel Pacing Threshold Pulse Width: 0.5 ms
Lead Channel Pacing Threshold Pulse Width: 0.5 ms
Lead Channel Sensing Intrinsic Amplitude: 12 mV
Lead Channel Sensing Intrinsic Amplitude: 5 mV
Lead Channel Setting Pacing Amplitude: 1.375
Lead Channel Setting Pacing Amplitude: 1.625
Lead Channel Setting Pacing Amplitude: 2.125
Lead Channel Setting Pacing Pulse Width: 0.5 ms
Lead Channel Setting Pacing Pulse Width: 0.5 ms
Lead Channel Setting Sensing Sensitivity: 0.5 mV
Pulse Gen Serial Number: 810027402

## 2022-01-27 ENCOUNTER — Ambulatory Visit (HOSPITAL_COMMUNITY): Payer: Medicare Other | Attending: Cardiovascular Disease

## 2022-01-27 DIAGNOSIS — Z95810 Presence of automatic (implantable) cardiac defibrillator: Secondary | ICD-10-CM | POA: Diagnosis not present

## 2022-01-27 DIAGNOSIS — I480 Paroxysmal atrial fibrillation: Secondary | ICD-10-CM | POA: Insufficient documentation

## 2022-01-27 DIAGNOSIS — I472 Ventricular tachycardia, unspecified: Secondary | ICD-10-CM | POA: Diagnosis not present

## 2022-01-27 DIAGNOSIS — I5022 Chronic systolic (congestive) heart failure: Secondary | ICD-10-CM | POA: Diagnosis not present

## 2022-01-27 DIAGNOSIS — I10 Essential (primary) hypertension: Secondary | ICD-10-CM | POA: Insufficient documentation

## 2022-01-27 LAB — ECHOCARDIOGRAM COMPLETE
AR max vel: 1.36 cm2
AV Area VTI: 1.29 cm2
AV Area mean vel: 1.55 cm2
AV Mean grad: 17 mmHg
AV Peak grad: 29.4 mmHg
Ao pk vel: 2.71 m/s
Area-P 1/2: 4.39 cm2
S' Lateral: 6.2 cm

## 2022-01-27 MED ORDER — PERFLUTREN LIPID MICROSPHERE
1.0000 mL | INTRAVENOUS | Status: AC | PRN
  Administered 2022-01-27: 1 mL via INTRAVENOUS

## 2022-02-03 ENCOUNTER — Ambulatory Visit (INDEPENDENT_AMBULATORY_CARE_PROVIDER_SITE_OTHER): Payer: Medicare Other | Admitting: Physician Assistant

## 2022-02-03 VITALS — BP 115/65 | HR 60 | Ht 70.0 in | Wt 231.0 lb

## 2022-02-03 DIAGNOSIS — I5023 Acute on chronic systolic (congestive) heart failure: Secondary | ICD-10-CM

## 2022-02-03 DIAGNOSIS — M25532 Pain in left wrist: Secondary | ICD-10-CM

## 2022-02-03 DIAGNOSIS — E1122 Type 2 diabetes mellitus with diabetic chronic kidney disease: Secondary | ICD-10-CM

## 2022-02-03 DIAGNOSIS — R2232 Localized swelling, mass and lump, left upper limb: Secondary | ICD-10-CM

## 2022-02-03 DIAGNOSIS — I10 Essential (primary) hypertension: Secondary | ICD-10-CM

## 2022-02-03 DIAGNOSIS — M7989 Other specified soft tissue disorders: Secondary | ICD-10-CM | POA: Diagnosis not present

## 2022-02-03 DIAGNOSIS — I255 Ischemic cardiomyopathy: Secondary | ICD-10-CM | POA: Diagnosis not present

## 2022-02-03 DIAGNOSIS — I420 Dilated cardiomyopathy: Secondary | ICD-10-CM | POA: Diagnosis not present

## 2022-02-03 LAB — POCT GLYCOSYLATED HEMOGLOBIN (HGB A1C): HbA1c, POC (controlled diabetic range): 6.4 % (ref 0.0–7.0)

## 2022-02-03 MED ORDER — HYDROCODONE-ACETAMINOPHEN 5-325 MG PO TABS: 1.0000 | ORAL_TABLET | Freq: Three times a day (TID) | ORAL | 0 refills | Status: AC | PRN

## 2022-02-03 MED ORDER — FUROSEMIDE 40 MG PO TABS: 40.0000 mg | ORAL_TABLET | Freq: Every day | ORAL | 1 refills | Status: DC

## 2022-02-03 MED ORDER — TRULICITY 1.5 MG/0.5ML ~~LOC~~ SOAJ: SUBCUTANEOUS | 0 refills | Status: DC

## 2022-02-03 MED ORDER — SPIRONOLACTONE 25 MG PO TABS: ORAL_TABLET | ORAL | 1 refills | Status: DC

## 2022-02-03 MED ORDER — CARVEDILOL 25 MG PO TABS
25.0000 mg | ORAL_TABLET | Freq: Two times a day (BID) | ORAL | 0 refills | Status: DC
Start: 2022-02-03 — End: 2022-07-02

## 2022-02-03 NOTE — Progress Notes (Signed)
Established Patient Office Visit  Subjective   Patient ID: Earl Kunesh Sr., male    DOB: 05-22-1941  Age: 81 y.o. MRN: [id number]  Chief Complaint  Patient presents with   Follow-up   Diabetes    HPI Pt is a 81 yo male with CHF, T2DM, HTN, HLD, BPH, Gout, PAF who presents to the clinic for 3 month follow up.   Pt is taking medications as directed. He denies any hypoglycemic events. Overall his SOB has been better. Recent echo done with cardiology and wonders about results.  Denies any CP or palpitations.   He does have some left ulnar wrist pain, redness, swelling, warmth. It has caused swelling in left hand and forearm. Pt denies any injuries. Hx of gout but never had in wrist. Rates 8/10 pain. Denies any alcohol. He has had a few pieces of fish.   .. Active Ambulatory Problems    Diagnosis Date Noted   Essential hypertension, benign 04/28/2013   BPH (benign prostatic hyperplasia) 04/28/2013   Gout 04/28/2013   Congestive dilated cardiomyopathy (HCC) 05/10/2013   ICD (implantable cardioverter-defibrillator) in place 05/10/2013   Elevated PSA 11/24/2013   Systolic dysfunction with acute on chronic heart failure (HCC) 09/03/2014   Morbid obesity (HCC) 09/03/2014   Erectile dysfunction 03/10/2015   Lower extremity edema 09/29/2017   Abnormal ultrasound of lower extremity 09/29/2017   Malignant neoplasm of prostate (HCC) 02/03/2018   Family history of cancer 02/03/2018   Family history of prostate cancer    Family history of breast cancer    Genetic testing 03/25/2018   Hypotension 05/23/2018   Paroxysmal atrial fibrillation (HCC) 12/25/2015   History of gout 08/01/2018   Type 2 diabetes mellitus with chronic kidney disease, 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HCC) 08/01/2018   Acute left-sided low back pain without sciatica 05/16/2019   Syncope and collapse 05/24/2019   Chronic systolic heart failure (HCC) 06/08/2019   Ventricular fibrillation (HCC) 08/16/2019    Fall 08/16/2019   Impetigo 08/18/2019   Rectal bleeding 12/11/2019   Mixed hyperlipidemia 12/11/2019   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   Vitamin D deficiency 11/03/2021   SOB (shortness of breath) 11/04/2021   Localized swelling of both lower extremities 11/04/2021   Restrictive pattern present on pulmonary function testing 11/18/2021   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    Objective:     BP 115/65   Pulse 60   Ht 5\' 10"  (1.778 m)   Wt 231 lb (104.8 kg)   SpO2 95%   BMI 33.15 kg/m  BP Readings from Last 3 Encounters:  02/03/22 115/65  01/07/22 130/70  12/23/21 128/72   Wt Readings from Last 3 Encounters:  02/03/22 231 lb (104.8 kg)  01/07/22 238 lb 11 oz (108.3 kg)  12/23/21 234 lb (106.1 kg)     .12/25/21 Results for orders placed or performed in visit on 02/03/22  Uric acid  Result Value Ref Range   Uric  Acid, Serum 7.0 4.0 - 8.0 mg/dL  CBC w/Diff/Platelet  Result Value Ref Range   WBC 6.8 3.8 - 10.8 Thousand/uL   RBC 5.18 4.20 - 5.80 Million/uL   Hemoglobin 15.4 13.2 - 17.1 g/dL   HCT 46.3 38.5 - 50.0 %   MCV 89.4 80.0 - 100.0 fL   MCH 29.7 27.0 - 33.0 pg   MCHC 33.3 32.0 - 36.0 g/dL   RDW 15.6 (H) 11.0 - 15.0 %   Platelets 253 140 - 400 Thousand/uL   MPV 10.8 7.5 - 12.5 fL   Neutro Abs 4,257 1,500 - 7,800 cells/uL   Lymphs Abs 1,639 850 - 3,900 cells/uL   Absolute Monocytes 673 200 - 950 cells/uL   Eosinophils Absolute 184 15 - 500 cells/uL   Basophils Absolute 48 0 -  200 cells/uL   Neutrophils Relative % 62.6 %   Total Lymphocyte 24.1 %   Monocytes Relative 9.9 %   Eosinophils Relative 2.7 %   Basophils Relative 0.7 %  BASIC METABOLIC PANEL WITH GFR  Result Value Ref Range   Glucose, Bld 89 65 - 99 mg/dL   BUN 18 7 - 25 mg/dL   Creat 1.34 (H) 0.70 - 1.22 mg/dL   eGFR 53 (L) > OR = 60 mL/min/1.44m2   BUN/Creatinine Ratio 13 6 - 22 (calc)   Sodium 143 135 - 146 mmol/L   Potassium 5.3 3.5 - 5.3 mmol/L   Chloride 105 98 - 110 mmol/L   CO2 26 20 - 32 mmol/L   Calcium 10.2 8.6 - 10.3 mg/dL  ANA Screen,IFA,Reflex Titer/Pattern,Reflex Mplx 11 Ab Cascade with IdentRA  Result Value Ref Range   Anti Nuclear Antibody (ANA) POSITIVE (A) NEGATIVE   Rhuematoid fact SerPl-aCnc <42 <70 IU/mL   Cyclic Citrullin Peptide Ab <16 UNITS  Sed Rate (ESR)  Result Value Ref Range   Sed Rate 2 0 - 20 mm/h  C-reactive protein  Result Value Ref Range   CRP 37.9 (H) <8.0 mg/L  Anti-nuclear ab-titer (ANA titer)  Result Value Ref Range   ANA Titer 1 1:40 (H) titer   ANA Pattern 1 Nuclear, Speckled (A)   POCT glycosylated hemoglobin (Hb A1C)  Result Value Ref Range   Hemoglobin A1C     HbA1c POC (<> result, manual entry)     HbA1c, POC (prediabetic range)     HbA1c, POC (controlled diabetic range) 6.4 0.0 - 7.0 %    Physical Exam Constitutional:      Appearance: Normal appearance.  HENT:     Head: Normocephalic.  Cardiovascular:     Rate and Rhythm: Normal rate and regular rhythm.     Heart sounds: Murmur heard.  Pulmonary:     Effort: Pulmonary effort is normal.     Breath sounds: Normal breath sounds.  Musculoskeletal:     Right lower leg: No edema.     Left lower leg: No edema.     Comments: Left ulnar wrist very tender to touch with warmth and swelling extending into left hand and up left forearm.  Neurological:     General: No focal deficit present.     Mental Status: He is alert and oriented to person, place, and time.  Psychiatric:        Mood  and Affect: Mood normal.          Assessment & Plan:  [name] KitchenMarland KitchenTra was seen today for follow-up and diabetes.  Diagnoses and all orders for this visit:  Type 2 diabetes mellitus  with chronic kidney disease, without long-term current use of insulin, unspecified CKD stage (HCC) -     POCT glycosylated hemoglobin (Hb A1C) -     Dulaglutide (TRULICITY) 1.5 HD/6.2IW SOPN; INJECT 1.5 MG (0.5ML)  SUBCUTANEOUSLY ONCE A WEEK  Left wrist pain -     Uric acid -     CBC w/Diff/Platelet -     BASIC METABOLIC PANEL WITH GFR -     HYDROcodone-acetaminophen (NORCO/VICODIN) 5-325 MG tablet; Take 1 tablet by mouth every 8 (eight) hours as needed for up to 5 days for moderate pain. -     ANA Screen,IFA,Reflex Titer/Pattern,Reflex Mplx 11 Ab Cascade with IdentRA -     Sed Rate (ESR) -     C-reactive protein -     Rheumatoid factor -     Cyclic citrul peptide antibody, IgG -     DG Wrist Complete Left; Future -     Anti-nuclear ab-titer (ANA titer) -     TIER 1  Left arm swelling -     Uric acid -     CBC w/Diff/Platelet -     BASIC METABOLIC PANEL WITH GFR -     HYDROcodone-acetaminophen (NORCO/VICODIN) 5-325 MG tablet; Take 1 tablet by mouth every 8 (eight) hours as needed for up to 5 days for moderate pain. -     ANA Screen,IFA,Reflex Titer/Pattern,Reflex Mplx 11 Ab Cascade with IdentRA -     Sed Rate (ESR) -     C-reactive protein -     Rheumatoid factor -     Cyclic citrul peptide antibody, IgG -     DG Wrist Complete Left; Future -     Anti-nuclear ab-titer (ANA titer) -     TIER 1  Localized swelling on left hand -     Uric acid -     CBC w/Diff/Platelet -     BASIC METABOLIC PANEL WITH GFR -     HYDROcodone-acetaminophen (NORCO/VICODIN) 5-325 MG tablet; Take 1 tablet by mouth every 8 (eight) hours as needed for up to 5 days for moderate pain. -     ANA Screen,IFA,Reflex Titer/Pattern,Reflex Mplx 11 Ab Cascade with IdentRA -     Sed Rate (ESR) -     C-reactive protein -      Rheumatoid factor -     Cyclic citrul peptide antibody, IgG -     DG Wrist Complete Left; Future -     Anti-nuclear ab-titer (ANA titer) -     TIER 1  Essential hypertension, benign -     carvedilol (COREG) 25 MG tablet; Take 1 tablet (25 mg total) by mouth 2 (two) times daily with a meal. -     furosemide (LASIX) 40 MG tablet; Take 1 tablet (40 mg total) by mouth daily.  Congestive dilated cardiomyopathy (HCC) -     carvedilol (COREG) 25 MG tablet; Take 1 tablet (25 mg total) by mouth 2 (two) times daily with a meal. -     furosemide (LASIX) 40 MG tablet; Take 1 tablet (40 mg total) by mouth daily. -     spironolactone (ALDACTONE) 25 MG tablet; Take 1 tablet by mouth once daily  Systolic dysfunction with acute on chronic heart failure (HCC) -     carvedilol (COREG) 25 MG tablet; Take 1 tablet (25 mg total) by mouth 2 (two) times daily with a meal. -     furosemide (LASIX) 40 MG tablet; Take 1 tablet (40 mg total) by mouth  daily. -     spironolactone (ALDACTONE) 25 MG tablet; Take 1 tablet by mouth once daily   A1C to goal Continue same medications BP to goal On statin Eye exam UTD. Covid/flu/pneumonia UTD. Follow up in 3 months  CHF Diuretics refilled [facility] reviewed and EF did increase by about 5percent from 1 year ago Follow up with cardiology BP good today and pt denies any SOB  Left wrist is painful and swollen Will get xray to look for trauma Will get labs to look for autoimmune causes ? gout Ice area Not candidate for oral NSAIDs Ok to use voltaren gel Norco for break through pain     [name], PA-C

## 2022-02-03 NOTE — Patient Instructions (Signed)
Get labs Get xray Ice left wrist Norco as needed for break through pain

## 2022-02-04 ENCOUNTER — Ambulatory Visit (INDEPENDENT_AMBULATORY_CARE_PROVIDER_SITE_OTHER): Payer: Medicare Other

## 2022-02-04 ENCOUNTER — Telehealth: Payer: Self-pay | Admitting: Physician Assistant

## 2022-02-04 DIAGNOSIS — R2232 Localized swelling, mass and lump, left upper limb: Secondary | ICD-10-CM | POA: Diagnosis not present

## 2022-02-04 DIAGNOSIS — M7989 Other specified soft tissue disorders: Secondary | ICD-10-CM

## 2022-02-04 DIAGNOSIS — M19032 Primary osteoarthritis, left wrist: Secondary | ICD-10-CM | POA: Insufficient documentation

## 2022-02-04 DIAGNOSIS — M25532 Pain in left wrist: Secondary | ICD-10-CM

## 2022-02-04 MED ORDER — DICLOFENAC SODIUM 1 % EX GEL: 4.0000 g | Freq: Four times a day (QID) | CUTANEOUS | 1 refills | Status: DC

## 2022-02-04 NOTE — Progress Notes (Signed)
Revin,   CRP elevated meaning lots of inflammation present.  Uric acid is normal. Not gout.  Autoimmune labs pending.

## 2022-02-04 NOTE — Progress Notes (Signed)
Moderate to Severe arthritic changes in wrist and hand. I am going to send some voltaren gel to use up to 4 times a day to apply over the post painful joints.

## 2022-02-06 ENCOUNTER — Encounter: Payer: Self-pay | Admitting: Physician Assistant

## 2022-02-06 DIAGNOSIS — M7989 Other specified soft tissue disorders: Secondary | ICD-10-CM | POA: Insufficient documentation

## 2022-02-06 DIAGNOSIS — M25532 Pain in left wrist: Secondary | ICD-10-CM | POA: Insufficient documentation

## 2022-02-07 LAB — CBC WITH DIFFERENTIAL/PLATELET
Absolute Monocytes: 673 cells/uL (ref 200–950)
Basophils Absolute: 48 cells/uL (ref 0–200)
Basophils Relative: 0.7 %
Eosinophils Absolute: 184 cells/uL (ref 15–500)
Eosinophils Relative: 2.7 %
HCT: 46.3 % (ref 38.5–50.0)
Hemoglobin: 15.4 g/dL (ref 13.2–17.1)
Lymphs Abs: 1639 cells/uL (ref 850–3900)
MCH: 29.7 pg (ref 27.0–33.0)
MCHC: 33.3 g/dL (ref 32.0–36.0)
MCV: 89.4 fL (ref 80.0–100.0)
MPV: 10.8 fL (ref 7.5–12.5)
Monocytes Relative: 9.9 %
Neutro Abs: 4257 cells/uL (ref 1500–7800)
Neutrophils Relative %: 62.6 %
Platelets: 253 10*3/uL (ref 140–400)
RBC: 5.18 10*6/uL (ref 4.20–5.80)
RDW: 15.6 % — ABNORMAL HIGH (ref 11.0–15.0)
Total Lymphocyte: 24.1 %
WBC: 6.8 10*3/uL (ref 3.8–10.8)

## 2022-02-07 LAB — INTERPRETATION

## 2022-02-07 LAB — TIER 1
Chromatin (Nucleosomal) Antibody: <1 AI
ENA SM Ab Ser-aCnc: <1 AI
Ribonucleic Protein(ENA) Antibody, IgG: <1 AI
SM/RNP: <1 AI
ds DNA Ab: <1 IU/mL

## 2022-02-07 LAB — ANTI-NUCLEAR AB-TITER (ANA TITER): ANA Titer 1: 1:40 {titer} — ABNORMAL HIGH

## 2022-02-07 LAB — ANA SCREEN,IFA,REFLEX TITER/PATTERN,REFLEX MPLX 11 AB CASCADE
14-3-3 eta Protein: <0.2 ng/mL (ref <0.2)
Anti Nuclear Antibody (ANA): POSITIVE — AB
Cyclic Citrullin Peptide Ab: <16 UNITS
Rheumatoid fact SerPl-aCnc: <14 IU/mL (ref <14)

## 2022-02-07 LAB — BASIC METABOLIC PANEL WITH GFR
BUN/Creatinine Ratio: 13 (calc) (ref 6–22)
BUN: 18 mg/dL (ref 7–25)
CO2: 26 mmol/L (ref 20–32)
Calcium: 10.2 mg/dL (ref 8.6–10.3)
Chloride: 105 mmol/L (ref 98–110)
Creat: 1.34 mg/dL — ABNORMAL HIGH (ref 0.70–1.22)
Glucose, Bld: 89 mg/dL (ref 65–99)
Potassium: 5.3 mmol/L (ref 3.5–5.3)
Sodium: 143 mmol/L (ref 135–146)
eGFR: 53 mL/min/{1.73_m2} — ABNORMAL LOW (ref >=60)

## 2022-02-07 LAB — TIER 2
Jo-1 Autoabs: <1 AI
SSA (Ro) (ENA) Antibody, IgG: <1 AI
SSB (La) (ENA) Antibody, IgG: <1 AI
Scleroderma (Scl-70) (ENA) Antibody, IgG: <1 AI

## 2022-02-07 LAB — TIER 3
Centromere Ab Screen: <1 AI
Ribosomal P Protein Ab: <1 AI

## 2022-02-07 LAB — SEDIMENTATION RATE: Sed Rate: 2 mm/h (ref 0–20)

## 2022-02-07 LAB — URIC ACID: Uric Acid, Serum: 7 mg/dL (ref 4.0–8.0)

## 2022-02-07 LAB — C-REACTIVE PROTEIN: CRP: 37.9 mg/L — ABNORMAL HIGH (ref <8.0)

## 2022-02-09 NOTE — Progress Notes (Signed)
ANA came back mildly positive but reflex showed no sign of autoimmune cause. How is wrist/hand/arm swelling.

## 2022-02-09 NOTE — Telephone Encounter (Signed)
Voltaren gel sent.

## 2022-02-11 NOTE — Progress Notes (Signed)
Remote ICD transmission.   

## 2022-02-16 ENCOUNTER — Ambulatory Visit (INDEPENDENT_AMBULATORY_CARE_PROVIDER_SITE_OTHER): Payer: Medicare Other

## 2022-02-16 DIAGNOSIS — I5022 Chronic systolic (congestive) heart failure: Secondary | ICD-10-CM | POA: Diagnosis not present

## 2022-02-16 DIAGNOSIS — Z95810 Presence of automatic (implantable) cardiac defibrillator: Secondary | ICD-10-CM | POA: Diagnosis not present

## 2022-02-17 NOTE — Progress Notes (Signed)
EPIC Encounter for ICM Monitoring  Patient Name: Earl Dority Sr. is a 81 y.o. male Date: 02/17/2022 Primary Care Physican: Lavada Mesi Primary Cardiologist: [name] Electrophysiologist: Allred Bi-V Pacing:  95% 07/23/2021 Weight: 235 lbs  10/03/2021 Weight: 235 lbs 01/13/2022 Weight: 238 lbs 02/17/2022 Weight: 228 lbs   AT/AF Burden 0%            Spoke with patient and heart failure questions reviewed.  Pt asymptomatic for fluid accumulation.  Reports feeling well at this time and voices no complaints.   He has been drinking a lot of fuids.   CorVue thoracic impedance suggesting possible fluid accumulation from 8/10-8/18 followed by possible dryness starting 8/20.    Prescribed:   Furosemide 40 mg 1 tablet (40 mg total) daily.    Spironolactone 25 mg take 0.5 tablet (12.5 mg total) daily Eliquis 5 mg take 1 tablet twice a day.   Labs: 02/03/2022 Creatinine 1.34, BUN 18, Potassium 5.3, Sodium 143, GFR 53 11/03/2021 Creatinine 1.23, BUN 18, Potassium 4.3, Sodium 143, GFR 59 07/25/2021 Creatinine 1.25, BUN 27, Potassium 4.1, Sodium 143, GFR 58  A complete set of results can be found in Results Review.   Recommendations:  Discussed drinking approximately 64 oz fluid daily to maintain normal fluid levels.      Follow-up plan: ICM clinic phone appointment on 02/23/2022 to recheck fluid levels.   91 day device clinic remote transmission 04/24/2022.     EP/Cardiology Office Visits:  05/08/2022 with Dr [name].  Recall 01/03/2023 with Oda Kilts, PA.   Copy of ICM check sent to Dr. Rayann Heman.   3 month ICM trend: 02/16/2022.    12-14 Month ICM trend:     Rosalene Billings, RN 02/17/2022 7:42 AM

## 2022-02-18 ENCOUNTER — Telehealth: Payer: Self-pay

## 2022-02-18 NOTE — Telephone Encounter (Signed)
Pt is requesting advice for dizziness last reported  at 11:45 am  pt states he's taking his medication and has been working in his garage when the spell of dizziness came over him, since then pt has been resting in bed

## 2022-02-18 NOTE — Progress Notes (Signed)
Synopsis: Referred for dyspnea by Donella Stade, PA-C  Subjective:   PATIENT ID: Earl Hatcher Sr. GENDER: male DOB: 03-20-1941, MRN: [id number]  Chief Complaint  Patient presents with   Follow-up    PFT's done today. Breathing is unchanged.     81yM with history of HFrEF due to NICM, pAF, VF, prostate ca, obesity, restrictive lung disease referred for dyspnea  Dyspnea, lack of energy over last 6 months. Weight is actually down a bit. He has no orthopnea. A month ago he had some BLE swelling - took an extra half dose of lasix for a while and that took care of the swelling - this didn't have an effect on his dyspnea. He doesn't really have much of a cough. He snores. He doesn't have PND. He has had no witnessed apneas. He had a sleep study 28 ya and did have OSA. He doesn't feel super sleepy during the day.   He tested positive for covid-19 12/18/21.    No history of asthma as a kid.   He has no family history of lung disease  No smoking, vaping, MJ. Worked in [employer], was Emergency planning/management officer. Has lived in [place], [place]. He has no pets at home.  Interval HPI: PFTs ordered - severe obstruction, air trapping, moderately reduced diffusing capacity, split night -on hold since he doesn't feel like he snores as much as he used to.  Similar extent of DOE to last visit. We talked about his obstructive lung disease - unclear if longstanding asthma. Doesn't have a ton of smoke exposure and CT Chest without emphysema. We talked about trial of inhaler, he asked about whether that would help his bendopnea and I said I thought it probably wouldn't.  Otherwise pertinent review of systems is negative.  Past Medical History:  Diagnosis Date   BPH (benign prostatic hyperplasia)    CHF (congestive heart failure) ([place])    Diabetes mellitus ([place])    Type II. Diet controlled   Family history of breast cancer    Family history of prostate cancer    Gout    Hyperlipidemia    Hypertension     Nonischemic cardiomyopathy ([place])    a. s/p STJ CRTD   Paroxysmal atrial fibrillation ([place]) 12/25/15   Prostate cancer ([place])      Family History  Problem Relation Age of Onset   Diabetes Mother    Hyperlipidemia Mother    Emphysema Father        smoked pipe   Breast cancer Sister 29   Heart attack Brother    Diabetes Brother    Throat cancer Brother 61       [place] vet, ? due to agent orange exposure   Prostate cancer Maternal Uncle    Prostate cancer Cousin        [name] vet, agent orange exposure   Prostate cancer Cousin        dx 28-30 yrs. - mat first cousin's son   Prostate cancer Cousin    Prostate cancer Cousin      Past Surgical History:  Procedure Laterality Date   BIV ICD GENERATOR CHANGEOUT N/A 10/22/2020   Procedure: BIV ICD GENERATOR CHANGEOUT;  Surgeon: Thompson Grayer, [place];  Location: [facility];  Service: Cardiovascular;  Laterality: N/A;   CARDIAC DEFIBRILLATOR PLACEMENT  Jan 2014   SJM Quadra Assura implanted by Dr [name] in [place] History   Socioeconomic History   Marital status:  Widowed    Spouse name: Not on file   Number of children: 1   Years of education: Bachelor's degree`   Highest education level: Bachelor's degree (e.g., BA, AB, BS)  Occupational History   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 He has one child and one grandson. They live close by and visit often. He enjoys sports and travelling.   Social Determinants of Health   Financial Resource Strain: Low Risk  (03/24/2021)   Overall Financial Resource Strain (CARDIA)    Difficulty of Paying Living Expenses: Not hard at all  Food Insecurity: No Food  Insecurity (03/24/2021)   Hunger Vital Sign    Worried About Running Out of Food in the Last Year: Never true    Ran Out of Food in the Last Year: Never true  Transportation Needs: No Transportation Needs (03/24/2021)   PRAPARE - Hydrologist (Medical): No    Lack of Transportation (Non-Medical): No  Physical Activity: Insufficiently Active (03/24/2021)   Exercise Vital Sign    Days of Exercise per Week: 4 days    Minutes of Exercise per Session: 30 min  Stress: No Stress Concern Present (03/24/2021)   [place]    Feeling of Stress : Not at all  Social Connections: Moderately Isolated (03/24/2021)   Social Connection and Isolation Panel [NHANES]    Frequency of Communication with Friends and Family: More than three times a week    Frequency of Social Gatherings with Friends and Family: Twice a week    Attends Religious Services: More than 4 times per year    Active Member of [employer] or Organizations: No    Attends Archivist Meetings: Never    Marital Status: Widowed  Intimate Partner Violence: Not At Risk (03/24/2021)   Humiliation, Afraid, Rape, and Kick questionnaire    Fear of Current or Ex-Partner: No    Emotionally Abused: No    Physically Abused: No    Sexually Abused: No     No Known Allergies   Outpatient Medications Prior to Visit  Medication Sig Dispense Refill   apixaban (ELIQUIS) 5 MG TABS tablet Take 1 tablet by mouth twice daily 180 tablet 1   atorvastatin (LIPITOR) 80 MG tablet TAKE 1 TABLET BY MOUTH ONCE DAILY. 90 tablet 3   carvedilol (COREG) 25 MG tablet Take 1 tablet (25 mg total) by mouth 2 (two) times daily with a meal. 180 tablet 0   diclofenac Sodium (VOLTAREN) 1 % GEL Apply 4 g topically 4 (four) times daily. To affected joint. 100 g 1   Dulaglutide (TRULICITY) 1.5 WE/9.9BZ SOPN INJECT 1.5 MG (0.5ML)  SUBCUTANEOUSLY ONCE A WEEK 12 mL 0    Empagliflozin-metFORMIN HCl ER (SYNJARDY XR) 03-999 MG TB24 Take 1 tablet by mouth daily. 90 tablet 3   furosemide (LASIX) 40 MG tablet Take 1 tablet (40 mg total) by mouth daily. 90 tablet 1   sacubitril-valsartan (ENTRESTO) 97-103 MG Take 1 tablet by mouth 2 (two) times daily. 180 tablet 3   sennosides-docusate sodium (SENOKOT-S) 8.6-50 MG tablet  Take 1 tablet by mouth daily as needed. For constipation     spironolactone (ALDACTONE) 25 MG tablet Take 1 tablet by mouth once daily 90 tablet 1   Vitamin D, Ergocalciferol, (DRISDOL) 1.25 MG (50000 UNIT) CAPS capsule Take 1 capsule (50,000 Units total) by mouth every 7 (seven) days. 12 capsule 3   No facility-administered medications prior to visit.       Objective:   Physical Exam:  General appearance: 81 y.o., male, NAD, conversant  Eyes: anicteric sclerae; PERRL, tracking appropriately HENT: NCAT; MMM Neck: Trachea midline; no lymphadenopathy, no JVD Lungs: CTAB, no crackles, no wheeze, with normal respiratory effort CV: RRR, no murmur  Abdomen: Soft, non-tender; non-distended, BS present  Extremities: No peripheral edema, warm Skin: Normal turgor and texture; no rash Psych: Appropriate affect Neuro: Alert and oriented to person and place, no focal deficit     Vitals:   02/20/22 1345  BP: 124/66  Pulse: 60  Temp: 99.1 F (37.3 C)  TempSrc: Oral  SpO2: 92%  Weight: 233 lb (105.7 kg)  Height: '5\' 10"'$  (1.778 m)    92% on RA BMI Readings from Last 3 Encounters:  02/20/22 33.43 kg/m  02/03/22 33.15 kg/m  01/07/22 34.25 kg/m   Wt Readings from Last 3 Encounters:  02/20/22 233 lb (105.7 kg)  02/03/22 231 lb (104.8 kg)  01/07/22 238 lb 11 oz (108.3 kg)     CBC    Component Value Date/Time   WBC 6.8 02/03/2022 0000   RBC 5.18 02/03/2022 0000   HGB 15.4 02/03/2022 0000   HGB 15.3 10/11/2020 1228   HCT 46.3 02/03/2022 0000   HCT 48.0 10/11/2020 1228   PLT 253 02/03/2022 0000   PLT 208 10/11/2020 1228   MCV  89.4 02/03/2022 0000   MCV 90 10/11/2020 1228   MCH 29.7 02/03/2022 0000   MCHC 33.3 02/03/2022 0000   RDW 15.6 (H) 02/03/2022 0000   RDW 15.3 10/11/2020 1228   LYMPHSABS 1,639 02/03/2022 0000   LYMPHSABS 1.9 10/11/2020 1228   EOSABS 184 02/03/2022 0000   EOSABS 0.2 10/11/2020 1228   BASOSABS 48 02/03/2022 0000   BASOSABS 0.1 10/11/2020 1228     Chest Imaging: CT Chest 05/09/21 reviewed by me with minor amount of scar in lower lobes, enlarged PA trunk, cardiomegaly  Pulmonary Functions Testing Results:    Latest Ref Rng & Units 02/20/2022   11:48 AM  PFT Results  FVC-Pre L 2.05  P  FVC-Predicted Pre % 51  P  FVC-Post L 2.15  P  FVC-Predicted Post % 53  P  Pre FEV1/FVC % % 54  P  Post FEV1/FCV % % 53  P  FEV1-Pre L 1.11  P  FEV1-Predicted Pre % 39  P  FEV1-Post L 1.13  P  DLCO uncorrected ml/min/mmHg 12.86  P  DLCO UNC% % 53  P  DLCO corrected ml/min/mmHg 12.58  P  DLCO COR %Predicted % 51  P  DLVA Predicted % 74  P  TLC L 5.47  P  TLC % Predicted % 77  P  RV % Predicted % 119  P    P Preliminary result   PFT 02/20/22 reviewed by me with severe obstruction, air trapping, moderately reduced diffusing capacity  Arlyce Harman 11/17/21 reviewed by me with FVC 57%, normal ratio reportedly  Echocardiogram:   TTE 08/05/20:  1. Left ventricular ejection fraction, by estimation, is 25 to 30%. The  left ventricle has severely decreased function. The left ventricle  demonstrates global hypokinesis. The left ventricular internal cavity size  was moderately dilated. Left  ventricular diastolic parameters are consistent with Grade II diastolic  dysfunction (pseudonormalization).   2. Mild to moderate aortic stenosis is present. V max 2.4 m/s, MG 12  mmHG, AVA 1.50 cm2, DI 0.33. The aortic valve is tricuspid. There is mild  calcification of the aortic valve. There is mild thickening of the aortic  valve. Aortic valve regurgitation is   not visualized. Mild to moderate aortic valve  stenosis.   3. Right ventricular systolic function is normal. The right ventricular  size is mildly enlarged. Tricuspid regurgitation signal is inadequate for  assessing PA pressure.   4. Left atrial size was moderately dilated.   5. Right atrial size was mildly dilated.   6. The mitral valve is grossly normal. No evidence of mitral valve  regurgitation. No evidence of mitral stenosis.   7. The inferior vena cava is normal in size with greater than 50%  respiratory variability, suggesting right atrial pressure of 3 mmHg.      Assessment & Plan:   # DOE  # Severe obstruction, air trapping, moderately reduced diffusing capacity Deconditioning, untreated OSA, CHF initially seemed to be most significant contributors however today PFT shows surprisingly severe obstructive lung disease. Could be longstanding asthma, doesn't have smoke exposure I'd expect for COPD. Would be surprised if covid-19 related small airways disease would result in defect this severe. Not sure what explains low DLCO other than possibility of PH underestimated on TTE.   # OSA not on CPAP  # Covid-19 infection Finished antiviral  Plan: - try albuterol 1-2 puffs as needed, can also use 5-20 minutes before heavy exertion to see if it helps. Reviewed technique - I'll be in touch early next week to let you know which asthma controller inhaler is most affordable for you and how to use it - consider in-lab split night sleep study if failing to improve    RTC 3 months to assess response to inhaler   Maryjane Hurter, MD [facility] 02/20/2022 1:48 PM

## 2022-02-19 ENCOUNTER — Telehealth: Payer: Self-pay

## 2022-02-19 NOTE — Telephone Encounter (Signed)
Alert remote sent by [employer]:   Normal device function.   There were six NSVT arrhythmias detected in the presence of atrial fib.  There was one VT arrhythmia that was successfully convert after one burst of antitachyardia pacing but only for a short period of time.    There was a second VT arrhythmia that was unsuccessfully treated with 3 bursts of ATP and one shock, the rhythm had deteriorated to VF and the second shock was successful.  This all occurred on 02/18/2022 from [id number].  Sent to triage.   This writer triaged alert and called patient to follow up.  He reports that he was waxing his car yesterday when the event happened, was shocked twice, and then after about 30 minutes, states he felt "back to normal".  No other symptoms to report and feels well since.   I did review with him driving restrictions per [place] DMV for next 6 months given that he has now received shock.  Also, reviewed that if patient starts feeling poorly again and/or receives any additional shocks, that he is to call 911 and go to the ER.   He has appointment schedule with Oda Kilts, PA next Friday 8/31 at 1130 am.  Several sooner dates offered to patient next week, however, he refused.  He does have our direct number here in the device clinic if any other questions or concerns.  Patient verbalized understanding of all information reviewed today.   Episode/EGM:

## 2022-02-20 ENCOUNTER — Encounter: Payer: Self-pay | Admitting: Student

## 2022-02-20 ENCOUNTER — Other Ambulatory Visit (HOSPITAL_COMMUNITY): Payer: Self-pay

## 2022-02-20 ENCOUNTER — Ambulatory Visit (INDEPENDENT_AMBULATORY_CARE_PROVIDER_SITE_OTHER): Payer: Medicare Other | Admitting: Student

## 2022-02-20 ENCOUNTER — Telehealth: Payer: Self-pay | Admitting: Student

## 2022-02-20 VITALS — BP 124/66 | HR 60 | Temp 99.1°F | Ht 70.0 in | Wt 233.0 lb

## 2022-02-20 DIAGNOSIS — J449 Chronic obstructive pulmonary disease, unspecified: Secondary | ICD-10-CM | POA: Diagnosis not present

## 2022-02-20 DIAGNOSIS — R0609 Other forms of dyspnea: Secondary | ICD-10-CM | POA: Diagnosis not present

## 2022-02-20 LAB — PULMONARY FUNCTION TEST
DL/VA % pred: 74 %
DL/VA: 2.88 ml/min/mmHg/L
DLCO cor % pred: 51 %
DLCO cor: 12.58 ml/min/mmHg
DLCO unc % pred: 53 %
DLCO unc: 12.86 ml/min/mmHg
FEF 25-75 Post: 0.62 L/sec
FEF 25-75 Pre: 0.82 L/sec
FEF2575-%Change-Post: -25 %
FEF2575-%Pred-Post: 32 %
FEF2575-%Pred-Pre: 42 %
FEV1-%Change-Post: 2 %
FEV1-%Pred-Post: 40 %
FEV1-%Pred-Pre: 39 %
FEV1-Post: 1.13 L
FEV1-Pre: 1.11 L
FEV1FVC-%Change-Post: -2 %
FEV1FVC-%Pred-Pre: 75 %
FEV6-%Change-Post: 4 %
FEV6-%Pred-Post: 57 %
FEV6-%Pred-Pre: 55 %
FEV6-Post: 2.15 L
FEV6-Pre: 2.05 L
FEV6FVC-%Pred-Post: 107 %
FEV6FVC-%Pred-Pre: 107 %
FVC-%Change-Post: 4 %
FVC-%Pred-Post: 53 %
FVC-%Pred-Pre: 51 %
FVC-Post: 2.15 L
FVC-Pre: 2.05 L
Post FEV1/FVC ratio: 53 %
Post FEV6/FVC ratio: 100 %
Pre FEV1/FVC ratio: 54 %
Pre FEV6/FVC Ratio: 100 %
RV % pred: 119 %
RV: 3.21 L
TLC % pred: 77 %
TLC: 5.47 L

## 2022-02-20 MED ORDER — BUDESONIDE-FORMOTEROL FUMARATE 160-4.5 MCG/ACT IN AERO: 2.0000 | INHALATION_SPRAY | Freq: Two times a day (BID) | RESPIRATORY_TRACT | 12 refills | Status: DC

## 2022-02-20 MED ORDER — ALBUTEROL SULFATE HFA 108 (90 BASE) MCG/ACT IN AERS: 2.0000 | INHALATION_SPRAY | Freq: Four times a day (QID) | RESPIRATORY_TRACT | 6 refills | Status: DC | PRN

## 2022-02-20 NOTE — Patient Instructions (Signed)
Full PFT performed today. °

## 2022-02-20 NOTE — Telephone Encounter (Signed)
Attempted to call to let him know I sent symbicort to [facility] in [place]. Unable to leave message. Can you call him to let him know I sent this to him and remind to rinse mouth after use?

## 2022-02-20 NOTE — Telephone Encounter (Signed)
What is least expensive laba/ics for him?  Thanks!

## 2022-02-20 NOTE — Patient Instructions (Signed)
-   try albuterol 1-2 puffs as needed, can also use 5-20 minutes before heavy exertion to see if it helps - I'll be in touch early next week to let you know which asthma controller inhaler is most affordable for you and how to use it - see you in 3 months or sooner if need be!

## 2022-02-20 NOTE — Progress Notes (Signed)
Full PFT performed today. °

## 2022-02-23 ENCOUNTER — Ambulatory Visit: Payer: Medicare Other

## 2022-02-23 DIAGNOSIS — I5022 Chronic systolic (congestive) heart failure: Secondary | ICD-10-CM

## 2022-02-23 DIAGNOSIS — Z95810 Presence of automatic (implantable) cardiac defibrillator: Secondary | ICD-10-CM

## 2022-02-24 ENCOUNTER — Telehealth: Payer: Self-pay

## 2022-02-24 NOTE — Telephone Encounter (Signed)
Remote ICM transmission received.  Attempted call to patient regarding ICM remote transmission and no answer or voice mail message.   

## 2022-02-24 NOTE — Progress Notes (Unsigned)
EPIC Encounter for ICM Monitoring  Patient Name: Earl Andreoli Sr. is a 81 y.o. male Date: 02/24/2022 Primary Care Physican: Lavada Mesi Primary Cardiologist: [name] Electrophysiologist: Marisa Sprinkles Pacing:  95% 07/23/2021 Weight: 235 lbs  10/03/2021 Weight: 235 lbs 01/13/2022 Weight: 238 lbs 02/17/2022 Weight: 228 lbs   AT/AF Burden <1%            Attempted call to patient and unable to reach.  Transmission reviewed.    CorVue thoracic impedance suggesting fluid levels returned to normal.   Device shock occurred 8/23 and addressed by device clinic as well has OV scheduled with Oda Kilts, PA for follow up.   Prescribed:   Furosemide 40 mg 1 tablet (40 mg total) daily.    Spironolactone 25 mg take 0.5 tablet (12.5 mg total) daily Eliquis 5 mg take 1 tablet twice a day.   Labs: 02/03/2022 Creatinine 1.34, BUN 18, Potassium 5.3, Sodium 143, GFR 53 11/03/2021 Creatinine 1.23, BUN 18, Potassium 4.3, Sodium 143, GFR 59 07/25/2021 Creatinine 1.25, BUN 27, Potassium 4.1, Sodium 143, GFR 58  A complete set of results can be found in Results Review.   Recommendations:  Unable to reach.      Follow-up plan: ICM clinic phone appointment on 03/23/2022.   91 day device clinic remote transmission 04/24/2022.     EP/Cardiology Office Visits: 02/26/2022 with Oda Kilts, PA for follow up device shock.   05/08/2022 with Dr [name].  Recall 01/03/2023 with Oda Kilts, PA.   Copy of ICM check sent to Dr. Quentin Ore (replacing Dr Rayann Heman).    3 month ICM trend: 02/23/2022.    12-14 Month ICM trend:     Rosalene Billings, RN 02/24/2022 3:59 PM

## 2022-02-24 NOTE — Telephone Encounter (Signed)
ATC patient.  No answer and no VM.  Will try again at a later time.

## 2022-02-25 NOTE — Progress Notes (Unsigned)
Electrophysiology Office Note Date: 02/25/2022  ID:  Earl Yaffe Sr., DOB [birth year], MRN [id number]  PCP: Donella Stade, PA-C Primary Cardiologist: Kirk Ruths, MD Electrophysiologist: Thompson Grayer, MD   CC: Routine ICD follow-up  Earl Hatcher Sr. is a 81 y.o. male seen today for Thompson Grayer, MD for acute visit due to ICD shock .  Pt had multiple NSVT episodes 8/23 including multiple that were detecting in the setting of AF. There was one VT arrhythmia that was successfully convert after one burst of antitachyardia pacing but only for a short period of time.     There was a second VT arrhythmia that was unsuccessfully treated with 3 bursts of ATP and one shock, the rhythm had deteriorated to VF and the second shock was successful.  This all occurred on 02/18/2022 from [id number]  Pt states overall he felt OK, but was surprised that he was shocked twice within several minutes. He denies chest pain, undue SOB, edema, or other symptoms. He has been in his USOH outside of these episodes that occurred while washing/waxing his car. He denies having missed any medications, or any recent illness.   Device History: STJ CRTD implanted 2014, gen change 10/22/20 for NICM and CHF History of appropriate therapy: Yes History of AAD therapy: No  Past Medical History:  Diagnosis Date   BPH (benign prostatic hyperplasia)    CHF (congestive heart failure) ([place])    Diabetes mellitus ([place])    Type II. Diet controlled   Family history of breast cancer    Family history of prostate cancer    Gout    Hyperlipidemia    Hypertension    Nonischemic cardiomyopathy ([place])    a. s/p STJ CRTD   Paroxysmal atrial fibrillation ([place]) 12/25/15   Prostate cancer [facility])    Past Surgical History:  Procedure Laterality Date   BIV ICD GENERATOR CHANGEOUT N/A 10/22/2020   Procedure: BIV ICD GENERATOR CHANGEOUT;  Surgeon: Thompson Grayer, MD;  Location: [place] [facility];  Service: Cardiovascular;   Laterality: N/A;   CARDIAC DEFIBRILLATOR PLACEMENT  Jan 2014   SJM Quadra Assura implanted by Dr [name] in [place]    Current Outpatient Medications  Medication Sig Dispense Refill   albuterol (VENTOLIN HFA) 108 (90 Base) MCG/ACT inhaler Inhale 2 puffs into the lungs every 6 (six) hours as needed for wheezing or shortness of breath. 8 g 6   apixaban (ELIQUIS) 5 MG TABS tablet Take 1 tablet by mouth twice daily 180 tablet 1   atorvastatin (LIPITOR) 80 MG tablet TAKE 1 TABLET BY MOUTH ONCE DAILY. 90 tablet 3   budesonide-formoterol (SYMBICORT) 160-4.5 MCG/ACT inhaler Inhale 2 puffs into the lungs in the morning and at bedtime. 1 each 12   carvedilol (COREG) 25 MG tablet Take 1 tablet (25 mg total) by mouth 2 (two) times daily with a meal. 180 tablet 0   diclofenac Sodium (VOLTAREN) 1 % GEL Apply 4 g topically 4 (four) times daily. To affected joint. 100 g 1   Dulaglutide (TRULICITY) 1.5 TI/1.4ER SOPN INJECT 1.5 MG (0.5ML)  SUBCUTANEOUSLY ONCE A WEEK 12 mL 0   Empagliflozin-metFORMIN HCl ER (SYNJARDY XR) 03-999 MG TB24 Take 1 tablet by mouth daily. 90 tablet 3   furosemide (LASIX) 40 MG tablet Take 1 tablet (40 mg total) by mouth daily. 90 tablet 1   sacubitril-valsartan (ENTRESTO) 97-103 MG Take 1 tablet by mouth 2 (two) times daily. 180 tablet 3   sennosides-docusate sodium (SENOKOT-S) 8.6-50 MG  tablet Take 1 tablet by mouth daily as needed. For constipation     spironolactone (ALDACTONE) 25 MG tablet Take 1 tablet by mouth once daily 90 tablet 1   Vitamin D, Ergocalciferol, (DRISDOL) 1.25 MG (50000 UNIT) CAPS capsule Take 1 capsule (50,000 Units total) by mouth every 7 (seven) days. 12 capsule 3   No current facility-administered medications for this visit.    Allergies:   Patient has no known allergies.   Social History: Social History   Socioeconomic History   Marital status: Widowed    Spouse name: Not on file   Number of children: 1   Years of education: Bachelor's  degree`   Highest education level: Bachelor's degree (e.g., BA, AB, BS)  Occupational History   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 He has one child and one grandson. They live close by and visit often. He enjoys sports and travelling.   Social Determinants of Health   Financial Resource Strain: Low Risk  (03/24/2021)   Overall Financial Resource Strain (CARDIA)    Difficulty of Paying Living Expenses: Not hard at all  Food Insecurity: No Food Insecurity (03/24/2021)   Hunger Vital Sign    Worried About Running Out of Food in the Last Year: Never true    Ran Out of Food in the Last Year: Never true  Transportation Needs: No Transportation Needs (03/24/2021)   PRAPARE - Hydrologist (Medical): No    Lack of Transportation (Non-Medical): No  Physical Activity: Insufficiently Active (03/24/2021)   Exercise Vital Sign    Days of Exercise per Week: 4 days    Minutes of Exercise per Session: 30 min  Stress: No Stress Concern Present (03/24/2021)   [place]    Feeling of Stress : Not at all  Social Connections: Moderately Isolated (03/24/2021)   Social Connection and Isolation Panel [NHANES]    Frequency of Communication with Friends and Family: More than three times a week    Frequency of Social Gatherings with Friends and Family: Twice a week    Attends Religious Services: More than 4 times per year    Active Member of [employer] or Organizations: No    Attends Archivist Meetings: Never    Marital Status: Widowed  Intimate Partner Violence: Not At Risk (03/24/2021)   Humiliation, Afraid,  Rape, and Kick questionnaire    Fear of Current or Ex-Partner: No    Emotionally Abused: No    Physically Abused: No    Sexually Abused: No    Family History: Family History  Problem Relation Age of Onset   Diabetes Mother    Hyperlipidemia Mother    Emphysema Father        smoked pipe   Breast cancer Sister 65   Heart attack Brother    Diabetes Brother    Throat cancer Brother 71       [place] [employer], ? due to agent orange exposure   Prostate cancer Maternal Uncle    Prostate cancer Cousin  [place], agent orange exposure   Prostate cancer Cousin        dx 28-30 yrs. - mat first cousin's son   Prostate cancer Cousin    Prostate cancer Cousin     Review of Systems: All other systems reviewed and are otherwise negative except as noted above.   Physical Exam: Vitals:   02/26/22 1130  BP: 126/78  Pulse: 61  SpO2: 92%  Weight: 234 lb 12.8 oz (106.5 kg)  Height: '5\' 10"'$  (1.778 m)     GEN- The patient is well appearing, alert and oriented x 3 today.   HEENT: normocephalic, atraumatic; sclera clear, conjunctiva pink; hearing intact; oropharynx clear; neck supple, no JVP Lymph- no cervical lymphadenopathy Lungs- Clear to ausculation bilaterally, normal work of breathing.  No wheezes, rales, rhonchi Heart- Regular rate and rhythm, no murmurs, rubs or gallops, PMI not laterally displaced GI- soft, non-tender, non-distended, bowel sounds present, no hepatosplenomegaly Extremities- no clubbing or cyanosis. No edema; DP/PT/radial pulses 2+ bilaterally MS- no significant deformity or atrophy Skin- warm and dry, no rash or lesion; ICD pocket well healed Psych- euthymic mood, full affect Neuro- strength and sensation are intact  ICD interrogation- reviewed in detail today,  See PACEART report  EKG:  EKG is not ordered today. Personal review of EKG ordered  01/07/2022  shows Biv paced at 64 bpm  Recent Labs: 11/03/2021: ALT 14; Brain Natriuretic Peptide 230; TSH  2.35 02/03/2022: BUN 18; Creat 1.34; Hemoglobin 15.4; Platelets 253; Potassium 5.3; Sodium 143   Wt Readings from Last 3 Encounters:  02/20/22 233 lb (105.7 kg)  02/03/22 231 lb (104.8 kg)  01/07/22 238 lb 11 oz (108.3 kg)     Other studies Reviewed: Additional studies/ records that were reviewed today include: Previous EP office notes.   Assessment and Plan:  1.  Chronic systolic dysfunction s/p St. Jude CRT-D  euvolemic today Stable on an appropriate medical regimen Normal ICD function See Pace Art report No changes today Echo 01/27/2022 LVEF 20-25%  2. 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 Had been quiescent for quite some time. We discussed at length. He is on 25 mg BID of coreg.  Consider mexiletine based on lab results, but given concomitant atrial fibrillation and advanced age, I think amiodarone would be best option for him. He is hesitant to start new medications  Baseline amiodarone labs today. If gross abnormalities, treat and follow. If stable, would start AAD given co-morbidities.    Current medicines are reviewed at length with the patient today.     Labs/ tests ordered today include:  Orders Placed This Encounter  Procedures   Comprehensive metabolic panel   TSH   T4, free   CBC   Magnesium   CUP PACEART INCLINIC DEVICE CHECK     Disposition:   Follow up with EP APP in  6-8 weeks     Signed, Shirley Friar, PA-C  02/25/2022 3:31 PM  [address] [place]  [address] [phone] (office) [phone] (fax)

## 2022-02-26 ENCOUNTER — Encounter: Payer: Self-pay | Admitting: Student

## 2022-02-26 ENCOUNTER — Ambulatory Visit: Payer: Medicare Other | Attending: Student | Admitting: Student

## 2022-02-26 VITALS — BP 126/78 | HR 61 | Ht 70.0 in | Wt 234.8 lb

## 2022-02-26 DIAGNOSIS — I472 Ventricular tachycardia, unspecified: Secondary | ICD-10-CM | POA: Diagnosis not present

## 2022-02-26 DIAGNOSIS — I5022 Chronic systolic (congestive) heart failure: Secondary | ICD-10-CM | POA: Insufficient documentation

## 2022-02-26 DIAGNOSIS — I4901 Ventricular fibrillation: Secondary | ICD-10-CM | POA: Insufficient documentation

## 2022-02-26 DIAGNOSIS — I255 Ischemic cardiomyopathy: Secondary | ICD-10-CM | POA: Diagnosis not present

## 2022-02-26 LAB — CUP PACEART INCLINIC DEVICE CHECK
Battery Remaining Longevity: 74 mo
Brady Statistic RA Percent Paced: 94 %
Brady Statistic RV Percent Paced: 94 %
Date Time Interrogation Session: 20230831140136
HighPow Impedance: 41 Ohm
Implantable Lead Implant Date: 20140117
Implantable Lead Implant Date: 20140117
Implantable Lead Implant Date: 20140117
Implantable Lead Location: 753858
Implantable Lead Location: 753859
Implantable Lead Location: 753860
Implantable Pulse Generator Implant Date: 20220426
Lead Channel Impedance Value: 400 Ohm
Lead Channel Impedance Value: 412.5 Ohm
Lead Channel Impedance Value: 825 Ohm
Lead Channel Pacing Threshold Amplitude: 0.625 V
Lead Channel Pacing Threshold Amplitude: 0.875 V
Lead Channel Pacing Threshold Amplitude: 1.125 V
Lead Channel Pacing Threshold Pulse Width: 0.5 ms
Lead Channel Pacing Threshold Pulse Width: 0.5 ms
Lead Channel Pacing Threshold Pulse Width: 0.5 ms
Lead Channel Sensing Intrinsic Amplitude: 12 mV
Lead Channel Sensing Intrinsic Amplitude: 2.25 mV
Lead Channel Setting Pacing Amplitude: 1.375
Lead Channel Setting Pacing Amplitude: 1.625
Lead Channel Setting Pacing Amplitude: 2.125
Lead Channel Setting Pacing Pulse Width: 0.5 ms
Lead Channel Setting Pacing Pulse Width: 0.5 ms
Lead Channel Setting Sensing Sensitivity: 0.5 mV
Pulse Gen Serial Number: 810027402

## 2022-02-26 NOTE — Patient Instructions (Signed)
Medication Instructions:  Your physician recommends that you continue on your current medications as directed. Please refer to the Current Medication list given to you today.  *If you need a refill on your cardiac medications before your next appointment, please call your pharmacy*   Lab Work: TODAY: CMET, TSH, FreeT4, Mag, CBC  If you have labs (blood work) drawn today and your tests are completely normal, you will receive your results only by: [place] (if you have MyChart) OR A paper copy in the mail If you have any lab test that is abnormal or we need to change your treatment, we will call you to review the results.   Follow-Up: At [facility], you and your health needs are our priority.  As part of our continuing mission to provide you with exceptional heart care, we have created designated Provider Care Teams.  These Care Teams include your primary Cardiologist (physician) and Advanced Practice Providers (APPs -  Physician Assistants and Nurse Practitioners) who all work together to provide you with the care you need, when you need it.  We recommend signing up for the patient portal called "MyChart".  Sign up information is provided on this After Visit Summary.  MyChart is used to connect with patients for Virtual Visits (Telemedicine).  Patients are able to view lab/test results, encounter notes, upcoming appointments, etc.  Non-urgent messages can be sent to your provider as well.   To learn more about what you can do with MyChart, go to [email or web address].    Your next appointment:   As scheduled

## 2022-02-27 LAB — MAGNESIUM: Magnesium: 1.9 mg/dL (ref 1.6–2.3)

## 2022-02-27 LAB — COMPREHENSIVE METABOLIC PANEL
ALT: 26 IU/L (ref 0–44)
AST: 30 IU/L (ref 0–40)
Albumin/Globulin Ratio: 1.7 (ref 1.2–2.2)
Albumin: 4.3 g/dL (ref 3.7–4.7)
Alkaline Phosphatase: 72 IU/L (ref 44–121)
BUN/Creatinine Ratio: 16 (ref 10–24)
BUN: 21 mg/dL (ref 8–27)
Bilirubin Total: 0.5 mg/dL (ref 0.0–1.2)
CO2: 26 mmol/L (ref 20–29)
Calcium: 10.1 mg/dL (ref 8.6–10.2)
Chloride: 107 mmol/L — ABNORMAL HIGH (ref 96–106)
Creatinine, Ser: 1.33 mg/dL — ABNORMAL HIGH (ref 0.76–1.27)
Globulin, Total: 2.5 g/dL (ref 1.5–4.5)
Glucose: 100 mg/dL — ABNORMAL HIGH (ref 70–99)
Potassium: 4.7 mmol/L (ref 3.5–5.2)
Sodium: 143 mmol/L (ref 134–144)
Total Protein: 6.8 g/dL (ref 6.0–8.5)
eGFR: 54 mL/min/{1.73_m2} — ABNORMAL LOW (ref >59)

## 2022-02-27 LAB — CBC
Hematocrit: 42.5 % (ref 37.5–51.0)
Hemoglobin: 14.1 g/dL (ref 13.0–17.7)
MCH: 30.3 pg (ref 26.6–33.0)
MCHC: 33.2 g/dL (ref 31.5–35.7)
MCV: 91 fL (ref 79–97)
Platelets: 186 10*3/uL (ref 150–450)
RBC: 4.66 x10E6/uL (ref 4.14–5.80)
RDW: 15.2 % (ref 11.6–15.4)
WBC: 5.3 10*3/uL (ref 3.4–10.8)

## 2022-02-27 LAB — T4, FREE: Free T4: 1.26 ng/dL (ref 0.82–1.77)

## 2022-02-27 LAB — TSH: TSH: 2.22 u[IU]/mL (ref 0.450–4.500)

## 2022-02-27 NOTE — Telephone Encounter (Signed)
LMTCB  Attempted to call pharmacy placed on a very long hold.   Will attempt to contact patient/pharmacy at next business day.

## 2022-03-03 NOTE — Telephone Encounter (Signed)
Called the pt and there was no answer- unable to leave msg  Closing per protocol

## 2022-03-13 ENCOUNTER — Encounter (HOSPITAL_BASED_OUTPATIENT_CLINIC_OR_DEPARTMENT_OTHER): Payer: Medicare Other | Admitting: Pulmonary Disease

## 2022-03-23 ENCOUNTER — Ambulatory Visit (INDEPENDENT_AMBULATORY_CARE_PROVIDER_SITE_OTHER): Payer: Medicare Other

## 2022-03-23 DIAGNOSIS — I5022 Chronic systolic (congestive) heart failure: Secondary | ICD-10-CM

## 2022-03-23 DIAGNOSIS — Z95810 Presence of automatic (implantable) cardiac defibrillator: Secondary | ICD-10-CM

## 2022-03-26 NOTE — Progress Notes (Signed)
EPIC Encounter for ICM Monitoring  Patient Name: Earl Funari Sr. is a 81 y.o. male Date: 03/26/2022 Primary Care Physican: Lavada Mesi Primary Cardiologist: [name] Electrophysiologist: Marisa Sprinkles Pacing:  96% 01/13/2022 Weight: 238 lbs 02/17/2022 Weight: 228 lbs 03/26/2022 Weight: 228 lbs   AT/AF Burden 0%             Spoke with patient and heart failure questions reviewed.  Transmission results reviewed.  Pt asymptomatic for fluid accumulation.  Reports feeling well at this time and voices no complaints.     CorVue thoracic impedance suggesting normal fluid levels.    Prescribed:   Furosemide 40 mg 1 tablet (40 mg total) daily.    Spironolactone 25 mg take 0.5 tablet (12.5 mg total) daily Eliquis 5 mg take 1 tablet twice a day.   Labs: 02/03/2022 Creatinine 1.34, BUN 18, Potassium 5.3, Sodium 143, GFR 53 11/03/2021 Creatinine 1.23, BUN 18, Potassium 4.3, Sodium 143, GFR 59 07/25/2021 Creatinine 1.25, BUN 27, Potassium 4.1, Sodium 143, GFR 58  A complete set of results can be found in Results Review.   Recommendations:  No changes and encouraged to call if experiencing any fluid symptoms.   Follow-up plan: ICM clinic phone appointment on 04/27/2022.   91 day device clinic remote transmission 04/24/2022.     EP/Cardiology Office Visits: 04/09/2022 with Oda Kilts, [place].   05/08/2022 with Dr [name].     Copy of ICM check sent to Dr. Quentin Ore.   3 month ICM trend: 03/23/2022.    12-14 Month ICM trend:     Rosalene Billings, RN 03/26/2022 10:14 AM

## 2022-03-30 ENCOUNTER — Ambulatory Visit (INDEPENDENT_AMBULATORY_CARE_PROVIDER_SITE_OTHER): Payer: Medicare Other | Admitting: Physician Assistant

## 2022-03-30 DIAGNOSIS — Z0000 Encounter for general adult medical examination without abnormal findings: Secondary | ICD-10-CM | POA: Diagnosis not present

## 2022-03-30 NOTE — Progress Notes (Signed)
MEDICARE ANNUAL WELLNESS VISIT  03/30/2022  Telephone Visit Disclaimer This Medicare AWV was conducted by telephone due to national recommendations for restrictions regarding the COVID-19 Pandemic (e.g. social distancing).  I verified, using two identifiers, that I am speaking with Earl Hatcher Sr. or their authorized healthcare agent. I discussed the limitations, risks, security, and privacy concerns of performing an evaluation and management service by telephone and the potential availability of an in-person appointment in the future. The patient expressed understanding and agreed to proceed.  Location of Patient: Home Location of Provider (nurse):  In the office.  Subjective:    Earl Wojnarowski Sr. is a 81 y.o. male patient of Alden Hipp, Royetta Car, PA-C who had a Medicare Annual Wellness Visit today via telephone. Nyheem is Retired and lives alone. he has 1 child. he reports that he is socially active and does interact with friends/family regularly. he is moderately physically active and enjoys sports, travelling and motorcycle riding.  Patient Care Team: Lavada Mesi as PCP - General (Family Medicine) [name] Denice Bors, MD as PCP - Cardiology (Cardiology) Thompson Grayer, MD as PCP - Electrophysiology (Cardiology) Patsey Berthold, NP (Inactive) as Nurse Practitioner (Cardiology)     03/30/2022    8:34 AM 03/24/2021    9:08 AM 10/22/2020   12:13 PM 05/23/2019    2:00 PM 05/17/2018    3:09 PM  Advanced Directives  Does Patient Have a Medical Advance Directive? Yes Yes Yes No Yes  Type of Advance Directive Living will Living will;Healthcare Power of Attorney Living will  Living will  Does patient want to make changes to medical advance directive? No - Patient declined No - Patient declined Yes (MAU/Ambulatory/Procedural Areas - Information given)  No - Patient declined  Copy of [place] in Chart?  No - copy requested     Would patient like information on  creating a medical advance directive?    Yes (MAU/Ambulatory/Procedural Areas - Information given)     Hospital Utilization Over the Past 12 Months: # of hospitalizations or ER visits: 0 # of surgeries: 0  Review of Systems    Patient reports that his overall health is better compared to last year.  History obtained from chart review and the patient  Patient Reported Readings (BP, Pulse, CBG, Weight, etc) none  Pain Assessment Pain : No/denies pain     Current Medications & Allergies (verified) Allergies as of 03/30/2022   No Known Allergies      Medication List        Accurate as of March 30, 2022  8:48 AM. If you have any questions, ask your nurse or doctor.          albuterol 108 (90 Base) MCG/ACT inhaler Commonly known as: VENTOLIN HFA Inhale 2 puffs into the lungs every 6 (six) hours as needed for wheezing or shortness of breath.   atorvastatin 80 MG tablet Commonly known as: LIPITOR TAKE 1 TABLET BY MOUTH ONCE DAILY.   budesonide-formoterol 160-4.5 MCG/ACT inhaler Commonly known as: Symbicort Inhale 2 puffs into the lungs in the morning and at bedtime.   carvedilol 25 MG tablet Commonly known as: COREG Take 1 tablet (25 mg total) by mouth 2 (two) times daily with a meal.   diclofenac Sodium 1 % Gel Commonly known as: VOLTAREN Apply 4 g topically 4 (four) times daily. To affected joint.   Eliquis 5 MG Tabs tablet Generic drug: apixaban Take 1 tablet by mouth twice daily  Entresto 97-103 MG Generic drug: sacubitril-valsartan Take 1 tablet by mouth 2 (two) times daily.   furosemide 40 MG tablet Commonly known as: LASIX Take 1 tablet (40 mg total) by mouth daily.   sennosides-docusate sodium 8.6-50 MG tablet Commonly known as: SENOKOT-S Take 1 tablet by mouth daily as needed. For constipation   spironolactone 25 MG tablet Commonly known as: ALDACTONE Take 1 tablet by mouth once daily   Synjardy XR 03-999 MG Tb24 Generic drug:  Empagliflozin-metFORMIN HCl ER Take 1 tablet by mouth daily.   Trulicity 1.5 XN/1.7GY Sopn Generic drug: Dulaglutide INJECT 1.5 MG (0.5ML)  SUBCUTANEOUSLY ONCE A WEEK   Vitamin D (Ergocalciferol) 1.25 MG (50000 UNIT) Caps capsule Commonly known as: DRISDOL Take 1 capsule (50,000 Units total) by mouth every 7 (seven) days.        History (reviewed): Past Medical History:  Diagnosis Date   BPH (benign prostatic hyperplasia)    CHF (congestive heart failure) ([place])    Diabetes mellitus ([place])    Type II. Diet controlled   Family history of breast cancer    Family history of prostate cancer    Gout    Hyperlipidemia    Hypertension    Nonischemic cardiomyopathy ([place])    a. s/p STJ CRTD   Paroxysmal atrial fibrillation ([place]) 12/25/15   Prostate cancer [facility])    Past Surgical History:  Procedure Laterality Date   BIV ICD GENERATOR CHANGEOUT N/A 10/22/2020   Procedure: BIV ICD GENERATOR CHANGEOUT;  Surgeon: Thompson Grayer, MD;  Location: [facility];  Service: Cardiovascular;  Laterality: N/A;   CARDIAC DEFIBRILLATOR PLACEMENT  Jan 2014   SJM Quadra Assura implanted by Dr [name] in [place]   Family History  Problem Relation Age of Onset   Diabetes Mother    Hyperlipidemia Mother    Emphysema Father        smoked pipe   Breast cancer Sister 27   Heart attack Brother    Diabetes Brother    Throat cancer Brother 70       [place], ? due to agent orange exposure   Prostate cancer Maternal Uncle    Prostate cancer Cousin        [name] vet, agent orange exposure   Prostate cancer Cousin        dx 28-30 yrs. - mat first cousin's son   Prostate cancer Cousin    Prostate cancer Cousin    Social History   Socioeconomic History   Marital status: Widowed    Spouse name: Not on file   Number of children: 1   Years of education: Bachelor's degree`   Highest education level: Bachelor's degree (e.g., BA, AB, BS)  Occupational History   Occupation: retired     Comment: [employer]  Tobacco Use   Smoking status: Never   Smokeless tobacco: Never  Vaping Use   Vaping Use: Never used  Substance and Sexual Activity   Alcohol use: Yes    Comment: Occasional   Drug use: No   Sexual activity: Yes  Other Topics Concern   Not on file  Social History Narrative   Lives alone. He has one child and one grandson. They live close by and visit often. He enjoys motorcycle riding, sports and travelling.   Social Determinants of Health   Financial Resource Strain: Low Risk  (03/30/2022)   Overall Financial Resource Strain (CARDIA)    Difficulty of Paying Living Expenses: Not hard at all  Food Insecurity: No Food Insecurity (03/30/2022)   Hunger Vital Sign    Worried About Running Out of Food in the Last Year: Never true    Ran Out of Food in the Last Year: Never true  Transportation Needs: No Transportation Needs (03/30/2022)   PRAPARE - Hydrologist (Medical): No    Lack of Transportation (Non-Medical): No  Physical Activity: Insufficiently Active (03/30/2022)   Exercise Vital Sign    Days of Exercise per Week: 5 days    Minutes of Exercise per Session: 20 min  Stress: No Stress Concern Present (03/30/2022)   [place]    Feeling of Stress : Not at all  Social Connections: Moderately Isolated (03/30/2022)   Social Connection and Isolation Panel [NHANES]    Frequency of Communication with Friends and Family: More than three times a week    Frequency of Social Gatherings with Friends and Family: More than three times a week    Attends Religious Services: Never    Marine scientist or Organizations: Yes    Attends Music therapist: More than 4 times per year    Marital Status: Widowed    Activities of Daily Living    03/30/2022    8:36 AM  In your present state of health, do you have any difficulty performing the following activities:   Hearing? 0  Vision? 0  Difficulty concentrating or making decisions? 0  Walking or climbing stairs? 0  Dressing or bathing? 0  Doing errands, shopping? 0  Preparing Food and eating ? N  Using the Toilet? N  In the past six months, have you accidently leaked urine? N  Do you have problems with loss of bowel control? N  Managing your Medications? N  Managing your Finances? N  Housekeeping or managing your Housekeeping? N    Patient Education/ Literacy How often do you need to have someone help you when you read instructions, pamphlets, or other written materials from your doctor or pharmacy?: 1 - Never What is the last grade level you completed in school?: Bachelors degree  Exercise Current Exercise Habits: Home exercise routine, Type of exercise: treadmill;Other - see comments (stationary bike), Time (Minutes): 20, Frequency (Times/Week): 5, Weekly Exercise (Minutes/Week): 100, Intensity: Moderate, Exercise limited by: None identified  Diet Patient reports consuming 2 meals a day and 1 snack(s) a day Patient reports that his primary diet is: Regular Patient reports that she does have regular access to food.   Depression Screen    03/30/2022    8:34 AM 03/24/2021    9:09 AM 12/11/2019    2:09 PM 05/16/2019    2:52 PM 05/17/2018    3:11 PM 01/31/2018    2:47 PM 03/05/2017    9:07 AM  PHQ 2/9 Scores  PHQ - 2 Score 0 0 0 1 0 0 0  PHQ- 9 Score   2 3        Fall Risk    03/30/2022    8:34 AM 03/24/2021    9:09 AM 12/11/2019    2:09 PM 05/24/2019   10:57 AM 05/23/2019    2:02 PM  Fall Risk   Falls in the past year? 0 0 1 0 0  Comment    Emmi Telephone Survey: data to providers prior to load   Number falls in past yr: 0 0 0  0  Injury with Fall? 0 0 1  0  Risk for fall due to : No Fall Risks No Fall Risks History of fall(s)    Follow up Falls evaluation completed Falls evaluation completed Falls evaluation completed  Falls prevention discussed     Objective:  Earl Hatcher  Sr. seemed alert and oriented and he participated appropriately during our telephone visit.  Blood Pressure Weight BMI  BP Readings from Last 3 Encounters:  02/26/22 126/78  02/20/22 124/66  02/03/22 115/65   Wt Readings from Last 3 Encounters:  02/26/22 234 lb 12.8 oz (106.5 kg)  02/20/22 233 lb (105.7 kg)  02/03/22 231 lb (104.8 kg)   BMI Readings from Last 1 Encounters:  02/26/22 33.69 kg/m    *Unable to obtain current vital signs, weight, and BMI due to telephone visit type  Hearing/Vision  Truddie Crumble did not seem to have difficulty with hearing/understanding during the telephone conversation Reports that he has not had a formal eye exam by an eye care professional within the past year Reports that he has not had a formal hearing evaluation within the past year *Unable to fully assess hearing and vision during telephone visit type  Cognitive Function:    03/30/2022    8:39 AM 03/24/2021    9:13 AM 05/23/2019    2:41 PM 05/17/2018    3:15 PM  6CIT Screen  What Year? 0 points 0 points 0 points 0 points  What month? 0 points 0 points 0 points 0 points  What time? 0 points 0 points 0 points 0 points  Count back from 20 0 points 0 points  0 points  Months in reverse 0 points 0 points  0 points  Repeat phrase 2 points 2 points  2 points  Total Score 2 points 2 points  2 points   (Normal:0-7, Significant for Dysfunction: >8)  Normal Cognitive Function Screening: Yes   Immunization & Health Maintenance Record Immunization History  Administered Date(s) Administered   Fluad Quad(high Dose 65+) 05/16/2019, 04/26/2020, 04/24/2021   Influenza, High Dose Seasonal PF 07/25/2021   Moderna SARS-COV2 Booster Vaccination 01/20/2021   Moderna Sars-Covid-2 Vaccination 08/26/2019, 09/23/2019, 04/25/2020   Pneumococcal Conjugate-13 08/07/2015   Pneumococcal Polysaccharide-23 08/26/2018   Tdap 11/02/2014    Health Maintenance  Topic Date Due   OPHTHALMOLOGY EXAM  03/30/2022  (Originally 06/12/2021)   Diabetic kidney evaluation - Urine ACR  03/31/2022 (Originally 08/15/2020)   FOOT EXAM  03/31/2022 (Originally 01/22/2022)   COVID-19 Vaccine (4 - Moderna risk series) 04/15/2022 (Originally 03/17/2021)   Zoster Vaccines- Shingrix (1 of 2) 05/06/2022 (Originally 10/28/1959)   INFLUENZA VACCINE  09/27/2022 (Originally 01/27/2022)   HEMOGLOBIN A1C  08/06/2022   Diabetic kidney evaluation - GFR measurement  02/27/2023   TETANUS/TDAP  11/01/2024   Pneumonia Vaccine 34+ Years old  Completed   HPV VACCINES  Aged Out       Assessment  This is a routine wellness examination for Colgate WhiteMarland Kitchen  Health Maintenance: Due or Overdue There are no preventive care reminders to display for this patient.   Earl Hatcher Sr. does not need a referral for Community Assistance: Care Management:   no Social Work:    no Prescription Assistance:  no Nutrition/Diabetes Education:  no   Plan:  Personalized Goals  Goals Addressed               This Visit's Progress     Patient Stated (pt-stated)        03/30/2022 AWV Goal: Improved Nutrition/Diet  Patient will verbalize understanding that diet plays  an important role in overall health and that a poor diet is a risk factor for many chronic medical conditions.  Over the next year, patient will improve self management of their diet by incorporating better variety. Patient will utilize available community resources to help with food acquisition if needed (ex: food pantries, [address], etc) Patient will work with nutrition specialist if a referral was made        Personalized Health Maintenance & Screening Recommendations  Influenza vaccine Shingrix vaccine- completed at [place].  Eye exam- due Urine ACR Foot exam  Lung Cancer Screening Recommended: no (Low Dose CT Chest recommended if Age 63-80 years, 30 pack-year currently smoking OR have quit w/in past 15 years) Hepatitis C Screening recommended: no HIV Screening  recommended: no  Advanced Directives: Written information was not prepared per patient's request.  Referrals & Orders No orders of the defined types were placed in this encounter.   Follow-up Plan Follow-up with Donella Stade, PA-C as planned Schedule the nurse visit for the flu shot Medicare wellness visit in one year.  AVS printed and mailed to the patient.   I have personally reviewed and noted the following in the patient's chart:   Medical and social history Use of alcohol, tobacco or illicit drugs  Current medications and supplements Functional ability and status Nutritional status Physical activity Advanced directives List of other physicians Hospitalizations, surgeries, and ER visits in previous 12 months Vitals Screenings to include cognitive, depression, and falls Referrals and appointments  In addition, I have reviewed and discussed with Earl Hatcher Sr. certain preventive protocols, quality metrics, and best practice recommendations. A written personalized care plan for preventive services as well as general preventive health recommendations is available and can be mailed to the patient at his request.      Tinnie Gens, RN BSN  03/30/2022

## 2022-03-30 NOTE — Patient Instructions (Addendum)
[place] Maintenance Summary and Written Plan of Care  [name] ,  Thank you for allowing me to perform your Medicare Annual Wellness Visit and for your ongoing commitment to your health.   Health Maintenance & Immunization History Health Maintenance  Topic Date Due  . OPHTHALMOLOGY EXAM  03/30/2022 (Originally 06/12/2021)  . Diabetic kidney evaluation - Urine ACR  03/31/2022 (Originally 08/15/2020)  . FOOT EXAM  03/31/2022 (Originally 01/22/2022)  . COVID-19 Vaccine (4 - Moderna risk series) 04/15/2022 (Originally 03/17/2021)  . Zoster Vaccines- Shingrix (1 of 2) 05/06/2022 (Originally 10/28/1959)  . INFLUENZA VACCINE  09/27/2022 (Originally 01/27/2022)  . HEMOGLOBIN A1C  08/06/2022  . Diabetic kidney evaluation - GFR measurement  02/27/2023  . TETANUS/TDAP  11/01/2024  . Pneumonia Vaccine 26+ Years old  Completed  . HPV VACCINES  Aged Out   Immunization History  Administered Date(s) Administered  . Fluad Quad(high Dose 65+) 05/16/2019, 04/26/2020, 04/24/2021  . Influenza, High Dose Seasonal PF 07/25/2021  . Moderna SARS-COV2 Booster Vaccination 01/20/2021  . Moderna Sars-Covid-2 Vaccination 08/26/2019, 09/23/2019, 04/25/2020  . Pneumococcal Conjugate-13 08/07/2015  . Pneumococcal Polysaccharide-23 08/26/2018  . Tdap 11/02/2014    These are the patient goals that we discussed:  Goals Addressed              This Visit's Progress   .  Patient Stated (pt-stated)        03/30/2022 AWV Goal: Improved Nutrition/Diet  Patient will verbalize understanding that diet plays an important role in overall health and that a poor diet is a risk factor for many chronic medical conditions.  Over the next year, patient will improve self management of their diet by incorporating better variety. Patient will utilize available community resources to help with food acquisition if needed (ex: food pantries, [address], etc) Patient will work with nutrition specialist  if a referral was made         This is a list of Health Maintenance Items that are overdue or due now: Influenza vaccine Shingrix vaccine- completed at [place].  Eye exam- due Urine ACR Foot exam  Orders/Referrals Placed Today: No orders of the defined types were placed in this encounter.  (Contact our referral department at [phone] if you have not spoken with someone about your referral appointment within the next 5 days)    Follow-up Plan Follow-up with Donella Stade, PA-C as planned Schedule the nurse visit for the flu shot Medicare wellness visit in one year.  AVS printed and mailed to the patient.      Health Maintenance, Male Adopting a healthy lifestyle and getting preventive care are important in promoting health and wellness. Ask your health care provider about: The right schedule for you to have regular tests and exams. Things you can do on your own to prevent diseases and keep yourself healthy. What should I know about diet, weight, and exercise? Eat a healthy diet  Eat a diet that includes plenty of vegetables, fruits, low-fat dairy products, and lean protein. Do not eat a lot of foods that are high in solid fats, added sugars, or sodium. Maintain a healthy weight Body mass index (BMI) is a measurement that can be used to identify possible weight problems. It estimates body fat based on height and weight. Your health care provider can help determine your BMI and help you achieve or maintain a healthy weight. Get regular exercise Get regular exercise. This is one of the most important things you can do for  your health. Most adults should: Exercise for at least 150 minutes each week. The exercise should increase your heart rate and make you sweat (moderate-intensity exercise). Do strengthening exercises at least twice a week. This is in addition to the moderate-intensity exercise. Spend less time sitting. Even light physical activity can be  beneficial. Watch cholesterol and blood lipids Have your blood tested for lipids and cholesterol at 81 years of age, then have this test every 5 years. You may need to have your cholesterol levels checked more often if: Your lipid or cholesterol levels are high. You are older than 81 years of age. You are at high risk for heart disease. What should I know about cancer screening? Many types of cancers can be detected early and may often be prevented. Depending on your health history and family history, you may need to have cancer screening at various ages. This may include screening for: Colorectal cancer. Prostate cancer. Skin cancer. Lung cancer. What should I know about heart disease, diabetes, and high blood pressure? Blood pressure and heart disease High blood pressure causes heart disease and increases the risk of stroke. This is more likely to develop in people who have high blood pressure readings or are overweight. Talk with your health care provider about your target blood pressure readings. Have your blood pressure checked: Every 3-5 years if you are 69-39 years of age. Every year if you are 84 years old or older. If you are between the ages of 60 and 38 and are a current or former smoker, ask your health care provider if you should have a one-time screening for abdominal aortic aneurysm (AAA). Diabetes Have regular diabetes screenings. This checks your fasting blood sugar level. Have the screening done: Once every three years after age 16 if you are at a normal weight and have a low risk for diabetes. More often and at a younger age if you are overweight or have a high risk for diabetes. What should I know about preventing infection? Hepatitis B If you have a higher risk for hepatitis B, you should be screened for this virus. Talk with your health care provider to find out if you are at risk for hepatitis B infection. Hepatitis C Blood testing is recommended for: Everyone  born from [birth year] through 1965. Anyone with known risk factors for hepatitis C. Sexually transmitted infections (STIs) You should be screened each year for STIs, including gonorrhea and chlamydia, if: You are sexually active and are younger than 81 years of age. You are older than 81 years of age and your health care provider tells you that you are at risk for this type of infection. Your sexual activity has changed since you were last screened, and you are at increased risk for chlamydia or gonorrhea. Ask your health care provider if you are at risk. Ask your health care provider about whether you are at high risk for HIV. Your health care provider may recommend a prescription medicine to help prevent HIV infection. If you choose to take medicine to prevent HIV, you should first get tested for HIV. You should then be tested every 3 months for as long as you are taking the medicine. Follow these instructions at home: Alcohol use Do not drink alcohol if your health care provider tells you not to drink. If you drink alcohol: Limit how much you have to 0-2 drinks a day. Know how much alcohol is in your drink. In the [place] one drink equals one 12 oz bottle  of beer (355 mL), one 5 oz glass of wine (148 mL), or one 1 oz glass of hard liquor (44 mL). Lifestyle Do not use any products that contain nicotine or tobacco. These products include cigarettes, chewing tobacco, and vaping devices, such as e-cigarettes. If you need help quitting, ask your health care provider. Do not use street drugs. Do not share needles. Ask your health care provider for help if you need support or information about quitting drugs. General instructions Schedule regular health, dental, and eye exams. Stay current with your vaccines. Tell your health care provider if: You often feel depressed. You have ever been abused or do not feel safe at home. Summary Adopting a healthy lifestyle and getting preventive care are important  in promoting health and wellness. Follow your health care provider's instructions about healthy diet, exercising, and getting tested or screened for diseases. Follow your health care provider's instructions on monitoring your cholesterol and blood pressure. This information is not intended to replace advice given to you by your health care provider. Make sure you discuss any questions you have with your health care provider. Document Revised: 11/04/2020 Document Reviewed: 11/04/2020 Elsevier Patient Education  [place].

## 2022-04-08 ENCOUNTER — Telehealth: Payer: Self-pay

## 2022-04-08 NOTE — Progress Notes (Deleted)
Electrophysiology Office Note Date: 04/08/2022  ID:  Earl Hatcher Sr., DOB 10-15-1940, MRN [id number]  PCP: Donella Stade, PA-C Primary Cardiologist: Kirk Ruths, MD Electrophysiologist: Vickie Epley, MD (from Dr. Rayann Heman  CC: Routine ICD follow-up  Earl Hatcher Sr. is a 81 y.o. male seen today for Vickie Epley, MD for acute visit due to ICD shock .  Pt previously multiple NSVT episodes 8/23 including multiple that were detecting in the setting of AF. There was one VT arrhythmia that was successfully convert after one burst of antitachyardia pacing but only for a short period of time.     There was a second VT arrhythmia that was unsuccessfully treated with 3 bursts of ATP and one shock, the rhythm had deteriorated to VF and the second shock was successful.  This all occurred on 02/18/2022 from [id number]  On 10/11 received alert for sustained VT with successful therapy on 04/07/22 EGM shows  AF with 1:1 tachycardia, HR 193, ATPx3, ATPx2 delivered, rhythm deteriorates to sustained VT, HV therapy delivered 850V, converting to regular AP/BiV pace, duration of event 34mn.  Eliquis, Coreg, Corvue stable  Pt states since last visit ***  Pt states overall he felt OK, but was surprised that he was shocked twice within several minutes. He denies chest pain, undue SOB, edema, or other symptoms. He has been in his USOH outside of these episodes that occurred while washing/waxing his car. He denies having missed any medications, or any recent illness.   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 Diet controlled   Family history of breast cancer    Family history of prostate cancer    Gout    Hyperlipidemia     Hypertension    [place]    a. s/p STJ CRTD   [place]   [facility]    Past Surgical History:  Procedure Laterality Date   BIV ICD GENERATOR CHANGEOUT N/A 10/22/2020   Procedure: BIV ICD GENERATOR CHANGEOUT;  Surgeon: AThompson Grayer MD;  [facility];  Service: Cardiovascular;  Laterality: N/A;   CARDIAC DEFIBRILLATOR PLACEMENT  Jan 2014   SJM Quadra Assura [place] [facility]   Current Outpatient Medications  Medication Sig Dispense Refill   albuterol (VENTOLIN HFA) 108 (90 Base) MCG/ACT inhaler Inhale 2 puffs into the lungs every 6 (six) hours as needed for wheezing or shortness of breath. 8 g 6   apixaban (ELIQUIS) 5 MG TABS tablet Take 1 tablet by mouth twice daily 180 tablet 1   atorvastatin (LIPITOR) 80 MG tablet TAKE 1 TABLET BY MOUTH ONCE DAILY. 90 tablet 3   budesonide-formoterol (SYMBICORT) 160-4.5 MCG/ACT inhaler Inhale 2 puffs into the lungs in the morning and at bedtime. 1 each 12   carvedilol (COREG) 25 MG tablet Take 1 tablet (25 mg total) by mouth 2 (two) times daily with a meal. 180 tablet 0   diclofenac Sodium (VOLTAREN) 1 % GEL Apply 4 g topically 4 (four) times daily. To affected joint. 100 g 1   Dulaglutide (TRULICITY) 1.5 MER/7.4YCSOPN INJECT 1.5 MG (0.5ML)  SUBCUTANEOUSLY ONCE A WEEK 12 mL 0   Empagliflozin-metFORMIN HCl  ER (SYNJARDY XR) 03-999 MG TB24 Take 1 tablet by mouth daily. 90 tablet 3   furosemide (LASIX) 40 MG tablet Take 1 tablet (40 mg total) by mouth daily. 90 tablet 1   sacubitril-valsartan (ENTRESTO) 97-103 MG Take 1 tablet by mouth 2 (two) times daily. 180 tablet 3   sennosides-docusate sodium (SENOKOT-S) 8.6-50 MG tablet Take 1 tablet by mouth daily as needed. For constipation     spironolactone (ALDACTONE) 25 MG tablet Take 1 tablet by mouth once daily 90 tablet 1   Vitamin D, Ergocalciferol, (DRISDOL) 1.25 MG (50000 UNIT) CAPS capsule  Take 1 capsule (50,000 Units total) by mouth every 7 (seven) days. (Patient not taking: Reported on 03/30/2022) 12 capsule 3   No current facility-administered medications for this visit.    Allergies:   Patient has no known allergies.   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 He has one child and one grandson. They live close by and visit often. He enjoys motorcycle riding, sports and travelling.   Social Determinants of Health   Financial Resource Strain: Low Risk  (03/30/2022)   Overall Financial Resource Strain (CARDIA)    Difficulty of Paying Living Expenses: Not hard at all  Food Insecurity: No Food Insecurity (03/30/2022)   Hunger Vital Sign    Worried About Running Out of Food in the Last Year: Never true    Ran Out of Food in the Last Year: Never true  Transportation Needs: No Transportation Needs (03/30/2022)   PRAPARE - Hydrologist (Medical): No    Lack of Transportation (Non-Medical): No  Physical Activity: Insufficiently Active (03/30/2022)   Exercise Vital Sign    Days of Exercise per Week: 5 days    Minutes of Exercise per Session: 20 min  Stress: No Stress Concern Present (03/30/2022)   [place]    Feeling of Stress : Not at all  Social Connections: Moderately Isolated (03/30/2022)   Social Connection and  Isolation Panel [NHANES]    Frequency of Communication with Friends and Family: More than three times a week    Frequency of Social Gatherings with Friends and Family: More than three times a week    Attends Religious Services: Never    Marine scientist or Organizations: Yes    Attends Music therapist: More than 4 times per year    Marital Status: Widowed  Intimate Partner Violence: Not At Risk (03/30/2022)   Humiliation, Afraid, Rape, and Kick questionnaire    Fear of Current or Ex-Partner: No    Emotionally Abused: No    Physically Abused: No    Sexually Abused: No    Family History: Family History  Problem Relation Age of Onset   Diabetes Mother    Hyperlipidemia Mother  Emphysema Father        smoked pipe   Breast cancer Sister 32   Heart attack Brother    Diabetes Brother    Throat cancer Brother 17       [employer] vet, ? due to agent orange exposure   Prostate cancer Maternal Uncle    Prostate cancer Cousin        [name] vet, agent orange exposure   Prostate cancer Cousin        dx 28-30 yrs. - mat first cousin's son   Prostate cancer Cousin    Prostate cancer Cousin     Review of Systems: Review of systems complete and found to be negative unless listed in HPI.     Physical Exam: There were no vitals filed for this visit.   General: Pleasant, NAD. No resp difficulty Psych: Normal affect. HEENT:  Normal, without mass or lesion.         Neck: Supple, no bruits or JVD. Carotids 2+. No lymphadenopathy/thyromegaly appreciated. Heart: PMI nondisplaced. RRR no s3, s4, or murmurs. Lungs:  Resp regular and unlabored, CTA. Abdomen: Soft, non-tender, non-distended, No HSM, BS + x 4.   Extremities: No clubbing, cyanosis or edema. DP/PT/Radials 2+ and equal bilaterally. Neuro: Alert and oriented X 3. Moves all extremities spontaneously.   ICD interrogation- reviewed in detail today,  See PACEART report  EKG:  EKG is ordered today. Personal  review of EKG ordered today shows ***  Recent Labs: 11/03/2021: Brain Natriuretic Peptide 230 02/26/2022: ALT 26; BUN 21; Creatinine, Ser 1.33; Hemoglobin 14.1; Magnesium 1.9; Platelets 186; Potassium 4.7; Sodium 143; TSH 2.220   Wt Readings from Last 3 Encounters:  02/26/22 234 lb 12.8 oz (106.5 kg)  02/20/22 233 lb (105.7 kg)  02/03/22 231 lb (104.8 kg)     Other studies Reviewed: Additional studies/ records that were reviewed today include: Previous EP office notes.   Assessment and Plan:  1.  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 VF with ICD shock 8/23 with multiple ATPs and shock and now again on 04/07/2022. *** Had been quiescent for quite some time. We discussed at length. He is on 25 mg BID of coreg.  Consider mexiletine based on lab results, but given concomitant atrial fibrillation and advanced age, I think amiodarone would be best option for him. He is hesitant to start new medications  Baseline amiodarone labs today. If gross abnormalities, treat and follow. If stable, would start AAD given co-morbidities.   3. Paroxysmal atrial fibrillation Continue eliquis 5 mg BID Burden ***  Current medicines are reviewed at length with the patient today.     Labs/ tests ordered today include:  No orders of the defined types were placed in this encounter.    Disposition:   Follow up with EP APP in  6-8 weeks     Signed, Shirley Friar, PA-C  04/08/2022 9:48 AM  [facility] [address] [place] [address] [phone] (office) [phone] (fax)

## 2022-04-08 NOTE — Telephone Encounter (Signed)
Unable to leave VM to discuss ICD shock on 04/07/22   Son's phone number not in service.   Device alert for sustained VT with successful therapy EGM shows  AF with 1:1 tachycardia, HR 193, ATPx3, ATPx2 delivered, rhythm deteriorates to sustained VT, HV therapy delivered 850V, converting to regular AP/BiV pace, duration of event 63mn.  Eliquis, Coreg, Corvue stable  Patient has apt scheduled with AOda Kilts10/12/23

## 2022-04-09 ENCOUNTER — Encounter: Payer: Medicare Other | Admitting: Student

## 2022-04-09 DIAGNOSIS — I472 Ventricular tachycardia, unspecified: Secondary | ICD-10-CM

## 2022-04-09 DIAGNOSIS — I5022 Chronic systolic (congestive) heart failure: Secondary | ICD-10-CM

## 2022-04-09 DIAGNOSIS — I480 Paroxysmal atrial fibrillation: Secondary | ICD-10-CM

## 2022-04-24 ENCOUNTER — Ambulatory Visit: Payer: Medicare Other | Attending: Cardiology

## 2022-04-24 DIAGNOSIS — I255 Ischemic cardiomyopathy: Secondary | ICD-10-CM

## 2022-04-24 LAB — CUP PACEART REMOTE DEVICE CHECK
Battery Remaining Longevity: 74 mo
Battery Remaining Percentage: 78 %
Battery Voltage: 2.99 V
Brady Statistic AP VP Percent: 92 %
Brady Statistic AP VS Percent: 1.3 %
Brady Statistic AS VP Percent: 4.1 %
Brady Statistic AS VS Percent: 2.3 %
Brady Statistic RA Percent Paced: 92 %
Date Time Interrogation Session: 20231027020131
HighPow Impedance: 41 Ohm
Implantable Lead Connection Status: 753985
Implantable Lead Connection Status: 753985
Implantable Lead Connection Status: 753985
Implantable Lead Implant Date: 20140117
Implantable Lead Implant Date: 20140117
Implantable Lead Implant Date: 20140117
Implantable Lead Location: 753858
Implantable Lead Location: 753859
Implantable Lead Location: 753860
Implantable Pulse Generator Implant Date: 20220426
Lead Channel Impedance Value: 410 Ohm
Lead Channel Impedance Value: 430 Ohm
Lead Channel Impedance Value: 940 Ohm
Lead Channel Pacing Threshold Amplitude: 0.625 V
Lead Channel Pacing Threshold Amplitude: 0.875 V
Lead Channel Pacing Threshold Amplitude: 1.125 V
Lead Channel Pacing Threshold Pulse Width: 0.5 ms
Lead Channel Pacing Threshold Pulse Width: 0.5 ms
Lead Channel Pacing Threshold Pulse Width: 0.5 ms
Lead Channel Sensing Intrinsic Amplitude: 12 mV
Lead Channel Sensing Intrinsic Amplitude: 5 mV
Lead Channel Setting Pacing Amplitude: 1.375
Lead Channel Setting Pacing Amplitude: 1.625
Lead Channel Setting Pacing Amplitude: 2.125
Lead Channel Setting Pacing Pulse Width: 0.5 ms
Lead Channel Setting Pacing Pulse Width: 0.5 ms
Lead Channel Setting Sensing Sensitivity: 0.5 mV
Pulse Gen Serial Number: 810027402

## 2022-04-27 ENCOUNTER — Ambulatory Visit (INDEPENDENT_AMBULATORY_CARE_PROVIDER_SITE_OTHER): Payer: Medicare Other

## 2022-04-27 DIAGNOSIS — Z95810 Presence of automatic (implantable) cardiac defibrillator: Secondary | ICD-10-CM

## 2022-04-27 DIAGNOSIS — I5022 Chronic systolic (congestive) heart failure: Secondary | ICD-10-CM

## 2022-04-28 ENCOUNTER — Telehealth: Payer: Self-pay

## 2022-04-28 NOTE — Progress Notes (Signed)
EPIC Encounter for ICM Monitoring  Patient Name: Earl Rawlins Sr. is a 81 y.o. male Date: 04/28/2022 Primary Care Physican: Lavada Mesi Primary Cardiologist: [name] Electrophysiologist: Marisa Sprinkles Pacing:  96% 01/13/2022 Weight: 238 lbs 02/17/2022 Weight: 228 lbs 03/26/2022 Weight: 228 lbs   AT/AF Burden 0%             Attempted call to patient and unable to reach.  Transmission reviewed.    CorVue thoracic impedance suggesting normal fluid levels.  Device clinic attempted to reach pt for 10/10 ICD shock and missed 10/12 appointment with Oda Kilts, PA for follow up.   Prescribed:   Furosemide 40 mg 1 tablet (40 mg total) daily.    Spironolactone 25 mg take 0.5 tablet (12.5 mg total) daily Eliquis 5 mg take 1 tablet twice a day.   Labs: 02/03/2022 Creatinine 1.34, BUN 18, Potassium 5.3, Sodium 143, GFR 53 11/03/2021 Creatinine 1.23, BUN 18, Potassium 4.3, Sodium 143, GFR 59 07/25/2021 Creatinine 1.25, BUN 27, Potassium 4.1, Sodium 143, GFR 58  A complete set of results can be found in Results Review.   Recommendations: Unable to reach.     Follow-up plan: ICM clinic phone appointment on 06/01/2022.   91 day device clinic remote transmission 07/24/2022.     EP/Cardiology Office Visits:  05/08/2022 with Dr [name].  Message sent to Dr Jacalyn Lefevre nurse to make Dr [name] aware of the shock and EP unable to reach patient.    Copy of ICM check sent to Dr. Quentin Ore.    3 month ICM trend: 04/27/2022.    12-14 Month ICM trend:     Rosalene Billings, RN 04/28/2022 8:45 AM

## 2022-04-28 NOTE — Telephone Encounter (Signed)
Remote ICM transmission received.  Attempted call to patient regarding ICM remote transmission and no answer or voice mail option.  

## 2022-04-30 NOTE — Progress Notes (Signed)
HPI: FU congestive heart failure.  I have not seen patient since January 2020; he has been seen by Dr. Rayann Heman.  Patient previously resided in [place] and then [place]. He apparently was diagnosed with a cardiomyopathy. He does not know the etiology and denies catheterization. He has had previous CRT-D. He moved here in March of 2014. Patient agreed to nuclear study April 2017. This showed ejection fraction 24% There was a large inferior lateral scar with minimal peri-infarct ischemia. Patient did not want to pursue cardiac catheterization.  Previously noted to have SVT, ventricular tachycardia and atrial fibrillation when interrogating his device.  Chest CT November 2022 showed dilated pulmonary artery at 4.7 cm consistent with pulmonary hypertension.  Coronary artery atherosclerosis also noted.  Echocardiogram August 2023 showed ejection fraction 20 to 25%, moderate left ventricular enlargement, mild left ventricular hypertrophy, grade 1 diastolic dysfunction, moderate biatrial enlargement, mild to moderate aortic stenosis with mean gradient 17 mmHg and aortic valve area 1.29 cm.  Since last seen, patient denies dyspnea, chest pain, palpitations or syncope.  He did have ICD discharge approximately 1 month ago by his report.   Current Outpatient Medications  Medication Sig Dispense Refill   albuterol (VENTOLIN HFA) 108 (90 Base) MCG/ACT inhaler Inhale 2 puffs into the lungs every 6 (six) hours as needed for wheezing or shortness of breath. 8 g 6   apixaban (ELIQUIS) 5 MG TABS tablet Take 1 tablet by mouth twice daily 180 tablet 1   atorvastatin (LIPITOR) 80 MG tablet TAKE 1 TABLET BY MOUTH ONCE DAILY. 90 tablet 3   budesonide-formoterol (SYMBICORT) 160-4.5 MCG/ACT inhaler Inhale 2 puffs into the lungs in the morning and at bedtime. 1 each 12   carvedilol (COREG) 25 MG tablet Take 1 tablet (25 mg total) by mouth 2 (two) times daily with a meal. 180 tablet 0   diclofenac Sodium  (VOLTAREN) 1 % GEL Apply 4 g topically 4 (four) times daily. To affected joint. 100 g 1   Dulaglutide (TRULICITY) 1.5 PF/7.9KW SOPN INJECT 1.5 MG (0.5ML)  SUBCUTANEOUSLY ONCE A WEEK 12 mL 0   Empagliflozin-metFORMIN HCl ER (SYNJARDY XR) 03-999 MG TB24 Take 1 tablet by mouth daily. 90 tablet 3   furosemide (LASIX) 40 MG tablet Take 1 tablet (40 mg total) by mouth daily. 90 tablet 1   sacubitril-valsartan (ENTRESTO) 97-103 MG Take 1 tablet by mouth 2 (two) times daily. 180 tablet 3   sennosides-docusate sodium (SENOKOT-S) 8.6-50 MG tablet Take 1 tablet by mouth daily as needed. For constipation     spironolactone (ALDACTONE) 25 MG tablet Take 1 tablet by mouth once daily 90 tablet 1   Vitamin D, Ergocalciferol, (DRISDOL) 1.25 MG (50000 UNIT) CAPS capsule Take 1 capsule (50,000 Units total) by mouth every 7 (seven) days. 12 capsule 3   No current facility-administered medications for this visit.     Past Medical History:  Diagnosis Date   BPH (benign prostatic hyperplasia)    CHF (congestive heart failure) ([place])    Diabetes mellitus ([place])    Type II. Diet controlled   Family history of breast cancer    Family history of prostate cancer    Gout    Hyperlipidemia    Hypertension    Nonischemic cardiomyopathy ([place])    a. s/p STJ CRTD   Paroxysmal atrial fibrillation ([place]) 12/25/15   Prostate cancer [facility])     Past Surgical History:  Procedure Laterality Date   BIV ICD GENERATOR CHANGEOUT N/A 10/22/2020  Procedure: BIV ICD GENERATOR CHANGEOUT;  Surgeon: Thompson Grayer, MD;  Location: [facility];  Service: Cardiovascular;  Laterality: N/A;   CARDIAC DEFIBRILLATOR PLACEMENT  Jan 2014   SJM Quadra Assura implanted by Dr [name] in [place] History   Socioeconomic History   Marital status: Widowed    Spouse name: Not on file   Number of children: 1   Years of education: Bachelor's degree`   Highest education level: Bachelor's degree (e.g., BA, AB, BS)   Occupational History   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 He has one child and one grandson. They live close by and visit often. He enjoys motorcycle riding, sports and travelling.   Social Determinants of Health   Financial Resource Strain: Low Risk  (03/30/2022)   Overall Financial Resource Strain (CARDIA)    Difficulty of Paying Living Expenses: Not hard at all  Food Insecurity: No Food Insecurity (03/30/2022)   Hunger Vital Sign    Worried About Running Out of Food in the Last Year: Never true    Ran Out of Food in the Last Year: Never true  Transportation Needs: No Transportation Needs (03/30/2022)   PRAPARE - Hydrologist (Medical): No    Lack of Transportation (Non-Medical): No  Physical Activity: Insufficiently Active (03/30/2022)   Exercise Vital Sign    Days of Exercise per Week: 5 days    Minutes of Exercise per Session: 20 min  Stress: No Stress Concern Present (03/30/2022)   [place]    Feeling of Stress : Not at all  Social Connections: Moderately Isolated (03/30/2022)   Social Connection and Isolation Panel [NHANES]    Frequency of Communication with Friends and Family: More than three times a week    Frequency of Social Gatherings with Friends and Family: More than three times a week    Attends Religious Services: Never    Marine scientist or Organizations: Yes    Attends Music therapist: More than 4 times per year    Marital Status: Widowed  Intimate Partner Violence: Not At Risk (03/30/2022)   Humiliation, Afraid, Rape, and Kick questionnaire     Fear of Current or Ex-Partner: No    Emotionally Abused: No    Physically Abused: No    Sexually Abused: No    Family History  Problem Relation Age of Onset   Diabetes Mother    Hyperlipidemia Mother    Emphysema Father        smoked pipe   Breast cancer Sister 69   Heart attack Brother    Diabetes Brother    Throat cancer Brother 40       [employer] [employer], ? due to agent orange exposure   Prostate cancer Maternal Uncle    Prostate cancer Cousin        [name] vet, agent orange exposure   Prostate cancer Cousin        dx 28-30 yrs. - mat first cousin's son   Prostate cancer Cousin  Prostate cancer Cousin     ROS: no fevers or chills, productive cough, hemoptysis, dysphasia, odynophagia, melena, hematochezia, dysuria, hematuria, rash, seizure activity, orthopnea, PND, pedal edema, claudication. Remaining systems are negative.  Physical Exam: Well-developed well-nourished in no acute distress.  Skin is warm and dry.  HEENT is normal.  Neck is supple.  Chest is clear to auscultation with normal expansion.  Cardiovascular exam is regular rate and rhythm.  Abdominal exam nontender or distended. No masses palpated. Extremities show no edema. neuro grossly intact  ECG- personally reviewed  A/P  1 paroxysmal atrial fibrillation-continue carvedilol and apixaban.  2 presumed coronary artery disease-previous nuclear study showed infarct but no ischemia and he declined catheterization.  Continue statin.  No aspirin given need for anticoagulation.  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  4 chronic systolic congestive heart failure-continue present dose of Lasix and spironolactone.  Continue empagliflozin/metformin ER.   5 ICD-monitored by electrophysiology.  Patient seen August 2023 following ICD discharge.  Amiodarone recommended. Patient had another ICD discharge recently and we will arrange follow-up with electrophysiology.  Will  add 200 mg BiD for 2 weeks then 200 mg daily thereafter.   6 hypertension-blood pressure controlled.  Continue present medications.  7 hyperlipidemia-continue statin.  Recent liver functions normal.  Check lipids.  8 Aortic stenosis-mild to moderate on most recent echo; 1120 patient will need follow-up echocardiogram August 2024.  Kirk Ruths, MD

## 2022-04-30 NOTE — Progress Notes (Signed)
Remote ICD transmission.   

## 2022-05-04 NOTE — Telephone Encounter (Signed)
Patient notified of Dr. Quentin Ore recommendations and advised someone from scheduling will call with apt. Patient voiced understanding.

## 2022-05-06 ENCOUNTER — Ambulatory Visit (INDEPENDENT_AMBULATORY_CARE_PROVIDER_SITE_OTHER): Payer: Medicare Other | Admitting: Physician Assistant

## 2022-05-06 ENCOUNTER — Encounter: Payer: Self-pay | Admitting: Physician Assistant

## 2022-05-06 VITALS — BP 129/59 | HR 101 | Ht 70.0 in | Wt 236.0 lb

## 2022-05-06 DIAGNOSIS — E1122 Type 2 diabetes mellitus with diabetic chronic kidney disease: Secondary | ICD-10-CM | POA: Diagnosis not present

## 2022-05-06 DIAGNOSIS — Z23 Encounter for immunization: Secondary | ICD-10-CM | POA: Diagnosis not present

## 2022-05-06 DIAGNOSIS — I255 Ischemic cardiomyopathy: Secondary | ICD-10-CM

## 2022-05-06 LAB — POCT GLYCOSYLATED HEMOGLOBIN (HGB A1C): Hemoglobin A1C: 6.6 % — AB (ref 4.0–5.6)

## 2022-05-06 MED ORDER — TRULICITY 1.5 MG/0.5ML ~~LOC~~ SOAJ: SUBCUTANEOUS | 0 refills | Status: DC

## 2022-05-06 NOTE — Progress Notes (Signed)
Established Patient Office Visit  Subjective   Patient ID: Earl Keysor Sr., male    DOB: 01-Sep-1940  Age: 81 y.o. MRN: [id number]  Chief Complaint  Patient presents with   Follow-up    Diabetes     HPI Pt is a 81 yo male with CHF, T2DM, HTN, HLD< BPH, Gout, PAF who presents to the clinic for 3 month follow up.   Pt is feeling pretty well today. He denies any significant SOB, HA, CP. He is not checking his sugars. She is compliant with medications. No hypoglycemic events. No open sores or wounds. No lower extremity edema.   Urinary symptoms are controlled.   He continues with follow ups with cardiology.     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   Rectal bleeding 12/11/2019   Mixed hyperlipidemia 12/11/2019   Impetigo 08/18/2019   Ventricular fibrillation ([place]) 08/16/2019   Fall 67/61/9509   Chronic systolic heart failure ([place]) 06/08/2019   Syncope and collapse 05/24/2019   Acute left-sided low back pain without sciatica 05/16/2019   History of gout 08/01/2018   Type 2 diabetes mellitus with chronic kidney disease, 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[place]) 08/01/2018   Hypotension 05/23/2018   Genetic testing 03/25/2018   Family history of prostate cancer    Family history of breast cancer    Malignant neoplasm of prostate ([place]) 02/03/2018   Family history of cancer 02/03/2018   Lower extremity edema 09/29/2017   Abnormal ultrasound of lower extremity 09/29/2017   Paroxysmal atrial fibrillation ([place]) 12/25/2015   Erectile dysfunction 32/67/1245   Systolic dysfunction with acute on chronic heart failure  ([place]) 09/03/2014   Morbid obesity ([place]) 09/03/2014   Elevated PSA 11/24/2013   Congestive dilated cardiomyopathy ([place]) 05/10/2013   ICD (implantable cardioverter-defibrillator) in place 05/10/2013   Essential hypertension, benign 04/28/2013   BPH (benign prostatic hyperplasia) 04/28/2013   Gout 04/28/2013   Past Medical History:  Diagnosis Date   BPH (benign prostatic hyperplasia)    CHF (congestive heart failure) ([place])    Diabetes mellitus ([place])    Type II. Diet controlled   Family history of breast cancer    Family history of prostate cancer    Gout    Hyperlipidemia    Hypertension    Nonischemic cardiomyopathy ([place])    a. s/p STJ CRTD   Paroxysmal atrial fibrillation ([place]) 12/25/15   Prostate cancer ([place])    Family History  Problem Relation Age of Onset   Diabetes Mother    Hyperlipidemia Mother    Emphysema Father        smoked pipe   Breast cancer Sister 88   Heart attack Brother    Diabetes Brother    Throat cancer Brother 9       [place] [employer], ? due to agent orange exposure   Prostate cancer Maternal Uncle    Prostate cancer Cousin        [name] vet, agent orange exposure   Prostate cancer Cousin        dx 28-30 yrs. - mat first cousin's son   Prostate cancer Cousin  Prostate cancer Cousin     ROS   See HPI.  Objective:     BP (!) 129/59   Pulse (!) 101   Ht '5\' 10"'$  (1.778 m)   Wt 236 lb (107 kg)   SpO2 95%   BMI 33.86 kg/m  BP Readings from Last 3 Encounters:  05/06/22 (!) 129/59  02/26/22 126/78  02/20/22 124/66   Wt Readings from Last 3 Encounters:  05/06/22 236 lb (107 kg)  02/26/22 234 lb 12.8 oz (106.5 kg)  02/20/22 233 lb (105.7 kg)    .[name] Results for orders placed or performed in visit on 05/06/22  POCT glycosylated hemoglobin (Hb A1C)  Result Value Ref Range   Hemoglobin A1C 6.6 (A) 4.0 - 5.6 %   HbA1c POC (<> result, manual entry)     HbA1c, POC (prediabetic range)     HbA1c, POC (controlled diabetic range)        Physical Exam Constitutional:      Appearance: Normal appearance. He is obese.  HENT:     Head: Normocephalic.  Cardiovascular:     Rate and Rhythm: Normal rate and regular rhythm.     Heart sounds: Murmur heard.  Pulmonary:     Effort: Pulmonary effort is normal.  Musculoskeletal:     Right lower leg: No edema.     Left lower leg: No edema.  Neurological:     General: No focal deficit present.     Mental Status: He is alert and oriented to person, place, and time.  Psychiatric:        Mood and Affect: Mood normal.      Assessment & Plan:  .[name] KitchenMarland KitchenBevin was seen today for follow-up.  Diagnoses and all orders for this visit:  Type 2 diabetes mellitus with chronic kidney disease, without long-term current use of insulin, unspecified CKD stage (HCC) -     POCT glycosylated hemoglobin (Hb A1C) -     POCT UA - Microalbumin -     Dulaglutide (TRULICITY) 1.5 ZO/1.0RU SOPN; INJECT 1.5 MG (0.5ML)  SUBCUTANEOUSLY ONCE A WEEK  Encounter for immunization [employer] Fall 2023 Covid-19 Vaccine 56yr and older  Need for immunization against influenza -     Flu Vaccine QUAD High Dose(Fluad)  Other orders -     Cancel: Flu vaccine HIGH DOSE PF (Fluzone High dose)   A1C to goal.  Continue on same medications.  BP to goal On ACE/ARB On STATIN Flu and covid vaccine given today.  Follow up in 3 months.    JIran Planas PA-C

## 2022-05-08 ENCOUNTER — Ambulatory Visit: Payer: Medicare Other | Attending: Cardiology | Admitting: Cardiology

## 2022-05-08 ENCOUNTER — Encounter: Payer: Self-pay | Admitting: Cardiology

## 2022-05-08 ENCOUNTER — Encounter: Payer: Self-pay | Admitting: Physician Assistant

## 2022-05-08 VITALS — BP 128/66 | HR 64 | Ht 70.0 in | Wt 232.0 lb

## 2022-05-08 DIAGNOSIS — I10 Essential (primary) hypertension: Secondary | ICD-10-CM

## 2022-05-08 DIAGNOSIS — Z95810 Presence of automatic (implantable) cardiac defibrillator: Secondary | ICD-10-CM

## 2022-05-08 DIAGNOSIS — I5022 Chronic systolic (congestive) heart failure: Secondary | ICD-10-CM

## 2022-05-08 DIAGNOSIS — I255 Ischemic cardiomyopathy: Secondary | ICD-10-CM | POA: Diagnosis not present

## 2022-05-08 DIAGNOSIS — E785 Hyperlipidemia, unspecified: Secondary | ICD-10-CM

## 2022-05-08 DIAGNOSIS — I472 Ventricular tachycardia, unspecified: Secondary | ICD-10-CM | POA: Diagnosis not present

## 2022-05-08 MED ORDER — AMIODARONE HCL 200 MG PO TABS: 200.0000 mg | ORAL_TABLET | Freq: Every day | ORAL | 3 refills | Status: DC

## 2022-05-08 MED ORDER — AMIODARONE HCL 200 MG PO TABS: ORAL_TABLET | ORAL | 0 refills | Status: DC

## 2022-05-08 NOTE — Patient Instructions (Signed)
Medication Instructions:   START AMIODARONE 200 MG TWICE DAILY X 14 DAYS THEN  TAKE AMIODARONE 200 MG ONCE DAILY  *If you need a refill on your cardiac medications before your next appointment, please call your pharmacy*   Lab Work:  Your physician recommends that you return for lab work FASTING  If you have labs (blood work) drawn today and your tests are completely normal, you will receive your results only by: [place] (if you have MyChart) OR A paper copy in the mail If you have any lab test that is abnormal or we need to change your treatment, we will call you to review the results.   Follow-Up: At [facility], you and your health needs are our priority.  As part of our continuing mission to provide you with exceptional heart care, we have created designated Provider Care Teams.  These Care Teams include your primary Cardiologist (physician) and Advanced Practice Providers (APPs -  Physician Assistants and Nurse Practitioners) who all work together to provide you with the care you need, when you need it.  We recommend signing up for the patient portal called "MyChart".  Sign up information is provided on this After Visit Summary.  MyChart is used to connect with patients for Virtual Visits (Telemedicine).  Patients are able to view lab/test results, encounter notes, upcoming appointments, etc.  Non-urgent messages can be sent to your provider as well.   To learn more about what you can do with MyChart, go to [email or web address].    Your next appointment:   6 month(s)  The format for your next appointment:   In Person  Provider:   Kirk Ruths, MD

## 2022-05-12 ENCOUNTER — Other Ambulatory Visit: Payer: Self-pay | Admitting: Cardiology

## 2022-05-12 DIAGNOSIS — E785 Hyperlipidemia, unspecified: Secondary | ICD-10-CM | POA: Diagnosis not present

## 2022-05-13 LAB — LIPID PANEL
Cholesterol: 132 mg/dL (ref <200)
HDL: 40 mg/dL (ref >=40)
LDL Cholesterol (Calc): 76 mg/dL (calc)
Non-HDL Cholesterol (Calc): 92 mg/dL (calc) (ref <130)
Total CHOL/HDL Ratio: 3.3 (calc) (ref <5.0)
Triglycerides: 80 mg/dL (ref <150)

## 2022-05-22 ENCOUNTER — Ambulatory Visit: Payer: Medicare Other | Admitting: Student

## 2022-05-24 NOTE — Progress Notes (Deleted)
Cardiology Office Note Date:  05/24/2022  Patient ID:  Earl Rosselli Sr., DOB 27-Feb-1941, MRN [id number] PCP:  Donella Stade, PA-C  Cardiologist:  Dr. [name] Electrophysiologist: Dr. Rayann Heman >> Dr. Quentin Ore  ***refresh   Chief Complaint: *** ICD therapy  History of Present Illness: Earl Stief Sr. is a 81 y.o. male with history of DM, HTN, HLD, Afib, ICM (presumed, based in nuclear study, pt declined cath), chronic CHF, VT, SVT.  He moved here in March of 2014. Patient agreed to nuclear study April 2017. This showed ejection fraction 24% There was a large inferior lateral scar with minimal peri-infarct ischemia. Patient did not want to pursue cardiac catheterization.    He saw Dr. Rayann Heman 01/07/22, doing OK, occasionally feeling weak/fatigued.  Planned to update his echo, was overdue to see Dr. [name].  Low AFib burden.  No VT.  He saw Jonni Sanger 02/26/22 for VT/ICD therapies.  He had several NSVTs withing AFib . Treated VT with ATP that converted him though quickly again into VT ATP x3 failed, HV failed > VF > shock with success. Discussed AAD options, Mexiletine though with AFib felt amiodarone the better option.  The patient though reluctant to start, and held off any changes.  Device clinic alert 04/08/22 for ICD therapies  He saw Dr. [name] 05/08/22, feeling well, denied any CP, ischemic symptoms,.  He did mention getting shocked.  Amiodarone '200mg'$  BID was started with plans to see EP.  *** syncope? *** symptoms *** amio labs *** driving *** labs, lytes *** eliquis, bleeding, dose, labs *** AF burden *** BP% *** volume, CoreVue *** looked like SVT > VF   Device information Abbott CRT-D implanted 07/15/2012 > gen change 10/22/20  + appropriate therapies No AAD to date   Past Medical History:  Diagnosis Date   BPH (benign prostatic hyperplasia)    CHF (congestive heart failure) ([place])    Diabetes mellitus ([place])    Type II. Diet controlled   Family history of  breast cancer    Family history of prostate cancer    Gout    Hyperlipidemia    Hypertension    Nonischemic cardiomyopathy ([place])    a. s/p STJ CRTD   Paroxysmal atrial fibrillation ([place]) 12/25/15   Prostate cancer [facility])     Past Surgical History:  Procedure Laterality Date   BIV ICD GENERATOR CHANGEOUT N/A 10/22/2020   Procedure: BIV ICD GENERATOR CHANGEOUT;  Surgeon: Thompson Grayer, MD;  Location: [facility];  Service: Cardiovascular;  Laterality: N/A;   CARDIAC DEFIBRILLATOR PLACEMENT  Jan 2014   SJM Quadra Assura implanted by Dr [name] in [place]    Current Outpatient Medications  Medication Sig Dispense Refill   albuterol (VENTOLIN HFA) 108 (90 Base) MCG/ACT inhaler Inhale 2 puffs into the lungs every 6 (six) hours as needed for wheezing or shortness of breath. 8 g 6   amiodarone (PACERONE) 200 MG tablet 1 tablet twice daily x 14 days 28 tablet 0   amiodarone (PACERONE) 200 MG tablet Take 1 tablet (200 mg total) by mouth daily. 90 tablet 3   apixaban (ELIQUIS) 5 MG TABS tablet Take 1 tablet by mouth twice daily 180 tablet 1   atorvastatin (LIPITOR) 80 MG tablet TAKE 1 TABLET BY MOUTH ONCE DAILY. 90 tablet 3   budesonide-formoterol (SYMBICORT) 160-4.5 MCG/ACT inhaler Inhale 2 puffs into the lungs in the morning and at bedtime. 1 each 12   carvedilol (COREG) 25 MG tablet Take 1 tablet (  25 mg total) by mouth 2 (two) times daily with a meal. 180 tablet 0   diclofenac Sodium (VOLTAREN) 1 % GEL Apply 4 g topically 4 (four) times daily. To affected joint. 100 g 1   Dulaglutide (TRULICITY) 1.5 OE/7.0JJ SOPN INJECT 1.5 MG (0.5ML)  SUBCUTANEOUSLY ONCE A WEEK 12 mL 0   Empagliflozin-metFORMIN HCl ER (SYNJARDY XR) 03-999 MG TB24 Take 1 tablet by mouth daily. 90 tablet 3   furosemide (LASIX) 40 MG tablet Take 1 tablet (40 mg total) by mouth daily. 90 tablet 1   sacubitril-valsartan (ENTRESTO) 97-103 MG Take 1 tablet by mouth 2 (two) times daily. 180 tablet 3    sennosides-docusate sodium (SENOKOT-S) 8.6-50 MG tablet Take 1 tablet by mouth daily as needed. For constipation     spironolactone (ALDACTONE) 25 MG tablet Take 1 tablet by mouth once daily 90 tablet 1   Vitamin D, Ergocalciferol, (DRISDOL) 1.25 MG (50000 UNIT) CAPS capsule Take 1 capsule (50,000 Units total) by mouth every 7 (seven) days. 12 capsule 3   No current facility-administered medications for this visit.    Allergies:   Patient has no known allergies.   Social History:  The patient  reports that he has never smoked. He has never used smokeless tobacco. He reports current alcohol use. He reports that he does not use drugs.   Family History:  The patient's family history includes Breast cancer (age of onset: 14) in his sister; Diabetes in his brother and mother; Emphysema in his father; Heart attack in his brother; Hyperlipidemia in his mother; Prostate cancer in his cousin, cousin, cousin, cousin, and maternal uncle; Throat cancer (age of onset: 15) in his brother.  ROS:  Please see the history of present illness.    All other systems are reviewed and otherwise negative.   PHYSICAL EXAM:  VS:  There were no vitals taken for this visit. BMI: There is no height or weight on file to calculate BMI. Well nourished, well developed, in no acute distress HEENT: normocephalic, atraumatic Neck: no JVD, carotid bruits or masses Cardiac:  *** RRR; no significant murmurs, no rubs, or gallops Lungs:  *** CTA b/l, no wheezing, rhonchi or rales Abd: soft, nontender MS: no deformity or *** atrophy Ext: *** no edema Skin: warm and dry, no rash Neuro:  No gross deficits appreciated Psych: euthymic mood, full affect  *** PPM site is stable, no tethering or discomfort   EKG:  Done today and reviewed by myself shows  ***  Device interrogation done today and reviewed by myself:  ***  01/27/22: TTE 1. Left ventricular ejection fraction, by estimation, is 20 to 25%. The  left ventricle has  severely decreased function. The left ventricle  demonstrates global hypokinesis. The left ventricular internal cavity size  was moderately dilated. There is mild  concentric left ventricular hypertrophy. Left ventricular diastolic  parameters are consistent with Grade I diastolic dysfunction (impaired  relaxation). Elevated left ventricular end-diastolic pressure.   2. Right ventricular systolic function is normal. The right ventricular  size is normal.   3. Left atrial size was moderately dilated.   4. Right atrial size was moderately dilated.   5. The mitral valve is normal in structure. No evidence of mitral valve  regurgitation. No evidence of mitral stenosis.   6. The aortic valve is tricuspid. There is mild calcification of the  aortic valve. There is mild thickening of the aortic valve. Aortic valve  regurgitation is not visualized. Mild to moderate aortic valve  stenosis.   7. Aortic dilatation noted. There is borderline dilatation of the aortic  root, measuring 36 mm. There is mild dilatation of the ascending aorta,  measuring 39 mm.   8. The inferior vena cava is normal in size with greater than 50%  respiratory variability, suggesting right atrial pressure of 3 mmHg.   Comparison(s): 08/05/20 EF 25-30%.    10/08/2015: Stress myoview The left ventricular ejection fraction is severely decreased (<30%). Nuclear stress EF: 24%. No T wave inversion was noted during stress. There was no ST segment deviation noted during stress. Defect 1: There is a large defect of moderate severity. Findings consistent with prior myocardial infarction with peri-infarct ischemia. This is a high risk study.   Large, severe partially reversible inferolateral perfusion defect suggestive of scar with minimal peri-infarct ischemia (SDS 4). LVEF 24% with global hypokinesis and inferolateral akinesis. This is a high risk study.   Recent Labs: 11/03/2021: Brain Natriuretic Peptide 230 02/26/2022: ALT 26;  BUN 21; Creatinine, Ser 1.33; Hemoglobin 14.1; Magnesium 1.9; Platelets 186; Potassium 4.7; Sodium 143; TSH 2.220  05/12/2022: Cholesterol 132; HDL 40; LDL Cholesterol (Calc) 76; Total CHOL/HDL Ratio 3.3; Triglycerides 80   CrCl cannot be calculated (Patient's most recent lab result is older than the maximum 21 days allowed.).   Wt Readings from Last 3 Encounters:  05/08/22 232 lb (105.2 kg)  05/06/22 236 lb (107 kg)  02/26/22 234 lb 12.8 oz (106.5 kg)     Other studies reviewed: Additional studies/records reviewed today include: summarized above  ASSESSMENT AND PLAN:  ICD ***  VT ***  Paroxysmal AFib CHA2DS2Vasc is 5 *** % burden  SVT ***  ICM Presumed CAD by stress test, pt declined cath) Chronic CHF *** C/w Dr. [name]   Disposition: F/u with ***  Current medicines are reviewed at length with the patient today.  The patient did not have any concerns regarding medicines.  Venetia Night, PA-C 05/24/2022 4:56 PM     [place] [address] [place] [address] [phone] (office)  [phone] (fax)

## 2022-05-26 ENCOUNTER — Encounter: Payer: Medicare Other | Admitting: Physician Assistant

## 2022-06-01 ENCOUNTER — Ambulatory Visit (INDEPENDENT_AMBULATORY_CARE_PROVIDER_SITE_OTHER): Payer: Medicare Other

## 2022-06-01 DIAGNOSIS — I5022 Chronic systolic (congestive) heart failure: Secondary | ICD-10-CM | POA: Diagnosis not present

## 2022-06-01 DIAGNOSIS — Z95810 Presence of automatic (implantable) cardiac defibrillator: Secondary | ICD-10-CM | POA: Diagnosis not present

## 2022-06-04 ENCOUNTER — Other Ambulatory Visit: Payer: Self-pay | Admitting: Internal Medicine

## 2022-06-04 ENCOUNTER — Encounter: Payer: Self-pay | Admitting: *Deleted

## 2022-06-04 DIAGNOSIS — I480 Paroxysmal atrial fibrillation: Secondary | ICD-10-CM

## 2022-06-04 NOTE — Telephone Encounter (Signed)
Prescription refill request for Eliquis received. Indication: PAF Last office visit: 05/08/22  Thresa Ross MD Scr: 1.33 on 02/26/22 Age: 81 Weight: 105.2kg  Based on above findings Eliquis '5mg'$  twice daily is the appropriate dose.  Refill approved.

## 2022-06-05 NOTE — Progress Notes (Signed)
EPIC Encounter for ICM Monitoring  Patient Name: Earl Vilardi Sr. is a 81 y.o. male Date: 06/05/2022 Primary Care Physican: Lavada Mesi Primary Cardiologist: [name] Electrophysiologist: Marisa Sprinkles Pacing:  96% 01/13/2022 Weight: 238 lbs 02/17/2022 Weight: 228 lbs 03/26/2022 Weight: 228 lbs   AT/AF Burden <1%            Transmission reviewed.    CorVue thoracic impedance suggesting normal fluid levels.  Device clinic attempted to reach pt for 10/10 ICD shock and missed 10/12 appointment with Oda Kilts, PA for follow up.   Prescribed:   Furosemide 40 mg 1 tablet (40 mg total) daily.    Spironolactone 25 mg take 0.5 tablet (12.5 mg total) daily Eliquis 5 mg take 1 tablet twice a day.   Labs: 02/03/2022 Creatinine 1.34, BUN 18, Potassium 5.3, Sodium 143, GFR 53 11/03/2021 Creatinine 1.23, BUN 18, Potassium 4.3, Sodium 143, GFR 59 07/25/2021 Creatinine 1.25, BUN 27, Potassium 4.1, Sodium 143, GFR 58  A complete set of results can be found in Results Review.   Recommendations: No changes.    Follow-up plan: ICM clinic phone appointment on 07/13/2022.   91 day device clinic remote transmission 07/24/2022.     EP/Cardiology Office Visits:  Recall 11/04/2022.  Recall 01/03/2023 with Oda Kilts, PA.    Copy of ICM check sent to Dr. Quentin Ore.    3 month ICM trend: 06/01/2022.    12-14 Month ICM trend:     Rosalene Billings, RN 06/05/2022 4:46 PM

## 2022-06-08 NOTE — Progress Notes (Unsigned)
Synopsis: Referred for dyspnea by Donella Stade, PA-C  Subjective:   PATIENT ID: Earl Hatcher Sr. GENDER: male DOB: Oct 04, 1940, MRN: [id number]  No chief complaint on file.   81yM with history of HFrEF due to NICM, pAF, VF, prostate ca, obesity, restrictive lung disease referred for dyspnea  Dyspnea, lack of energy over last 6 months. Weight is actually down a bit. He has no orthopnea. A month ago he had some BLE swelling - took an extra half dose of lasix for a while and that took care of the swelling - this didn't have an effect on his dyspnea. He doesn't really have much of a cough. He snores. He doesn't have PND. He has had no witnessed apneas. He had a sleep study 4 ya and did have OSA. He doesn't feel super sleepy during the day.   He tested positive for covid-19 12/18/21.    No history of asthma as a kid.   He has no family history of lung disease  No smoking, vaping, MJ. Worked in [employer] organ[employer], was Emergency planning/management officer. Has lived in [place], [place]. He has no pets at home.  Interval HPI: PFTs ordered - severe obstruction, air trapping, moderately reduced diffusing capacity, split night -on hold since he doesn't feel like he snores as much as he used to.  Similar extent of DOE to last visit. We talked about his obstructive lung disease - unclear if longstanding asthma. Doesn't have a ton of smoke exposure and CT Chest without emphysema. We talked about trial of inhaler, he asked about whether that would help his bendopnea and I said I thought it probably wouldn't. ------------------------------------------------ Symbicort called in after last visit  Otherwise pertinent review of systems is negative.  Past Medical History:  Diagnosis Date   BPH (benign prostatic hyperplasia)    CHF (congestive heart failure) ([place])    Diabetes mellitus ([place])    Type II. Diet controlled   Family history of breast cancer    Family history of prostate cancer    Gout    Hyperlipidemia     Hypertension    Nonischemic cardiomyopathy ([place])    a. s/p STJ CRTD   Paroxysmal atrial fibrillation ([place]) 12/25/15   Prostate cancer ([place])      Family History  Problem Relation Age of Onset   Diabetes Mother    Hyperlipidemia Mother    Emphysema Father        smoked pipe   Breast cancer Sister 66   Heart attack Brother    Diabetes Brother    Throat cancer Brother 6       [place] vet, ? due to agent orange exposure   Prostate cancer Maternal Uncle    Prostate cancer Cousin        [name] vet, agent orange exposure   Prostate cancer Cousin        dx 28-30 yrs. - mat first cousin's son   Prostate cancer Cousin    Prostate cancer Cousin      Past Surgical History:  Procedure Laterality Date   BIV ICD GENERATOR CHANGEOUT N/A 10/22/2020   Procedure: BIV ICD GENERATOR CHANGEOUT;  Surgeon: Thompson Grayer, [place];  Location: [facility];  Service: Cardiovascular;  Laterality: N/A;   CARDIAC DEFIBRILLATOR PLACEMENT  Jan 2014   SJM Quadra Assura implanted by Dr [name] in [place] History   Socioeconomic History   Marital status: Widowed    Spouse name: Not on  file   Number of children: 1   Years of education: Bachelor's degree`   Highest education level: Bachelor's degree (e.g., BA, AB, BS)  Occupational History   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 He has one child and one grandson. They live close by and visit often. He enjoys motorcycle riding, sports and travelling.   Social Determinants of Health   Financial Resource Strain: Low Risk  (03/30/2022)   Overall Financial Resource Strain (CARDIA)    Difficulty of Paying Living Expenses: Not hard at  all  Food Insecurity: No Food Insecurity (03/30/2022)   Hunger Vital Sign    Worried About Running Out of Food in the Last Year: Never true    Ran Out of Food in the Last Year: Never true  Transportation Needs: No Transportation Needs (03/30/2022)   PRAPARE - Hydrologist (Medical): No    Lack of Transportation (Non-Medical): No  Physical Activity: Insufficiently Active (03/30/2022)   Exercise Vital Sign    Days of Exercise per Week: 5 days    Minutes of Exercise per Session: 20 min  Stress: No Stress Concern Present (03/30/2022)   [place]    Feeling of Stress : Not at all  Social Connections: Moderately Isolated (03/30/2022)   Social Connection and Isolation Panel [NHANES]    Frequency of Communication with Friends and Family: More than three times a week    Frequency of Social Gatherings with Friends and Family: More than three times a week    Attends Religious Services: Never    Marine scientist or Organizations: Yes    Attends Music therapist: More than 4 times per year    Marital Status: Widowed  Intimate Partner Violence: Not At Risk (03/30/2022)   Humiliation, Afraid, Rape, and Kick questionnaire    Fear of Current or Ex-Partner: No    Emotionally Abused: No    Physically Abused: No    Sexually Abused: No     No Known Allergies   Outpatient Medications Prior to Visit  Medication Sig Dispense Refill   albuterol (VENTOLIN HFA) 108 (90 Base) MCG/ACT inhaler Inhale 2 puffs into the lungs every 6 (six) hours as needed for wheezing or shortness of breath. 8 g 6   amiodarone (PACERONE) 200 MG tablet 1 tablet twice daily x 14 days 28 tablet 0   amiodarone (PACERONE) 200 MG tablet Take 1 tablet (200 mg total) by mouth daily. 90 tablet 3   apixaban (ELIQUIS) 5 MG TABS tablet Take 1 tablet by mouth twice daily 180 tablet 1   atorvastatin (LIPITOR) 80 MG tablet TAKE 1  TABLET BY MOUTH ONCE DAILY. 90 tablet 3   budesonide-formoterol (SYMBICORT) 160-4.5 MCG/ACT inhaler Inhale 2 puffs into the lungs in the morning and at bedtime. 1 each 12   carvedilol (COREG) 25 MG tablet Take 1 tablet (25 mg total) by mouth 2 (two) times daily with a meal. 180 tablet 0   diclofenac Sodium (VOLTAREN) 1 % GEL Apply 4 g topically 4 (four) times daily. To affected joint.  100 g 1   Dulaglutide (TRULICITY) 1.5 KV/4.2VZ SOPN INJECT 1.5 MG (0.5ML)  SUBCUTANEOUSLY ONCE A WEEK 12 mL 0   Empagliflozin-metFORMIN HCl ER (SYNJARDY XR) 03-999 MG TB24 Take 1 tablet by mouth daily. 90 tablet 3   furosemide (LASIX) 40 MG tablet Take 1 tablet (40 mg total) by mouth daily. 90 tablet 1   sacubitril-valsartan (ENTRESTO) 97-103 MG Take 1 tablet by mouth 2 (two) times daily. 180 tablet 3   sennosides-docusate sodium (SENOKOT-S) 8.6-50 MG tablet Take 1 tablet by mouth daily as needed. For constipation     spironolactone (ALDACTONE) 25 MG tablet Take 1 tablet by mouth once daily 90 tablet 1   Vitamin D, Ergocalciferol, (DRISDOL) 1.25 MG (50000 UNIT) CAPS capsule Take 1 capsule (50,000 Units total) by mouth every 7 (seven) days. 12 capsule 3   No facility-administered medications prior to visit.       Objective:   Physical Exam:  General appearance: 81 y.o., male, NAD, conversant  Eyes: anicteric sclerae; PERRL, tracking appropriately HENT: NCAT; MMM Neck: Trachea midline; no lymphadenopathy, no JVD Lungs: CTAB, no crackles, no wheeze, with normal respiratory effort CV: RRR, no murmur  Abdomen: Soft, non-tender; non-distended, BS present  Extremities: No peripheral edema, warm Skin: Normal turgor and texture; no rash Psych: Appropriate affect Neuro: Alert and oriented to person and place, no focal deficit     There were no vitals filed for this visit.     on RA BMI Readings from Last 3 Encounters:  05/08/22 33.29 kg/m  05/06/22 33.86 kg/m  02/26/22 33.69 kg/m   Wt Readings  from Last 3 Encounters:  05/08/22 232 lb (105.2 kg)  05/06/22 236 lb (107 kg)  02/26/22 234 lb 12.8 oz (106.5 kg)     CBC    Component Value Date/Time   WBC 5.3 02/26/2022 1202   WBC 6.8 02/03/2022 0000   RBC 4.66 02/26/2022 1202   RBC 5.18 02/03/2022 0000   HGB 14.1 02/26/2022 1202   HCT 42.5 02/26/2022 1202   PLT 186 02/26/2022 1202   MCV 91 02/26/2022 1202   MCH 30.3 02/26/2022 1202   MCH 29.7 02/03/2022 0000   MCHC 33.2 02/26/2022 1202   MCHC 33.3 02/03/2022 0000   RDW 15.2 02/26/2022 1202   LYMPHSABS 1,639 02/03/2022 0000   LYMPHSABS 1.9 10/11/2020 1228   EOSABS 184 02/03/2022 0000   EOSABS 0.2 10/11/2020 1228   BASOSABS 48 02/03/2022 0000   BASOSABS 0.1 10/11/2020 1228     Chest Imaging: CT Chest 05/09/21 reviewed by me with minor amount of scar in lower lobes, enlarged PA trunk, cardiomegaly  Pulmonary Functions Testing Results:    Latest Ref Rng & Units 02/20/2022   11:48 AM  PFT Results  FVC-Pre L 2.05   FVC-Predicted Pre % 51   FVC-Post L 2.15   FVC-Predicted Post % 53   Pre FEV1/FVC % % 54   Post FEV1/FCV % % 53   FEV1-Pre L 1.11   FEV1-Predicted Pre % 39   FEV1-Post L 1.13   DLCO uncorrected ml/min/mmHg 12.86   DLCO UNC% % 53   DLCO corrected ml/min/mmHg 12.58   DLCO COR %Predicted % 51   DLVA Predicted % 74   TLC L 5.47   TLC % Predicted % 77   RV % Predicted % 119    PFT 02/20/22 reviewed by me with severe obstruction, air trapping, moderately reduced diffusing capacity  Arlyce Harman 11/17/21 reviewed by me with FVC 57%, normal ratio reportedly  Echocardiogram:   TTE 08/05/20:  1. Left ventricular ejection fraction, by estimation, is 25 to 30%. The  left ventricle has severely decreased function. The left ventricle  demonstrates global hypokinesis. The left ventricular internal cavity size  was moderately dilated. Left  ventricular diastolic parameters are consistent with Grade II diastolic  dysfunction (pseudonormalization).   2. Mild to  moderate aortic stenosis is present. V max 2.4 m/s, MG 12  mmHG, AVA 1.50 cm2, DI 0.33. The aortic valve is tricuspid. There is mild  calcification of the aortic valve. There is mild thickening of the aortic  valve. Aortic valve regurgitation is   not visualized. Mild to moderate aortic valve stenosis.   3. Right ventricular systolic function is normal. The right ventricular  size is mildly enlarged. Tricuspid regurgitation signal is inadequate for  assessing PA pressure.   4. Left atrial size was moderately dilated.   5. Right atrial size was mildly dilated.   6. The mitral valve is grossly normal. No evidence of mitral valve  regurgitation. No evidence of mitral stenosis.   7. The inferior vena cava is normal in size with greater than 50%  respiratory variability, suggesting right atrial pressure of 3 mmHg.      Assessment & Plan:   # DOE  # Severe obstruction, air trapping, moderately reduced diffusing capacity Deconditioning, untreated OSA, CHF initially seemed to be most significant contributors however today PFT shows surprisingly severe obstructive lung disease. Could be longstanding asthma, doesn't have smoke exposure I'd expect for COPD. Would be surprised if covid-19 related small airways disease would result in defect this severe. Not sure what explains low DLCO other than possibility of PH underestimated on TTE.   # OSA not on CPAP  # Covid-19 infection Finished antiviral  Plan: - try albuterol 1-2 puffs as needed, can also use 5-20 minutes before heavy exertion to see if it helps. Reviewed technique - I'll be in touch early next week to let you know which asthma controller inhaler is most affordable for you and how to use it - consider in-lab split night sleep study if failing to improve    RTC 3 months to assess response to inhaler   Maryjane Hurter, MD [facility] 06/08/2022 6:34 PM

## 2022-06-10 ENCOUNTER — Encounter: Payer: Self-pay | Admitting: Student

## 2022-06-10 ENCOUNTER — Ambulatory Visit (INDEPENDENT_AMBULATORY_CARE_PROVIDER_SITE_OTHER): Payer: Medicare Other | Admitting: Student

## 2022-06-10 VITALS — BP 110/64 | HR 50 | Temp 98.2°F | Ht 70.0 in | Wt 238.4 lb

## 2022-06-10 DIAGNOSIS — R0609 Other forms of dyspnea: Secondary | ICD-10-CM

## 2022-06-10 DIAGNOSIS — G4733 Obstructive sleep apnea (adult) (pediatric): Secondary | ICD-10-CM

## 2022-06-10 DIAGNOSIS — J449 Chronic obstructive pulmonary disease, unspecified: Secondary | ICD-10-CM | POA: Diagnosis not present

## 2022-06-10 NOTE — Patient Instructions (Signed)
-   symbicort 2 puffs twice daily rinse mouth after use - albuterol 1-2 puffs as needed - rescue inhaler - see you in 6 months or sooner if need be

## 2022-07-02 ENCOUNTER — Other Ambulatory Visit: Payer: Self-pay | Admitting: Physician Assistant

## 2022-07-02 DIAGNOSIS — I420 Dilated cardiomyopathy: Secondary | ICD-10-CM

## 2022-07-02 DIAGNOSIS — I5023 Acute on chronic systolic (congestive) heart failure: Secondary | ICD-10-CM

## 2022-07-02 DIAGNOSIS — I10 Essential (primary) hypertension: Secondary | ICD-10-CM

## 2022-07-03 IMAGING — DX DG CHEST 2V
2 series · 2 of 2 positions shown · non-contrast
Comparison: April 24, 2021

CLINICAL DATA: cough, nasal congestion and chest pain. Low-grade
fever.

EXAM:
CHEST - 2 VIEW

[chest pa]
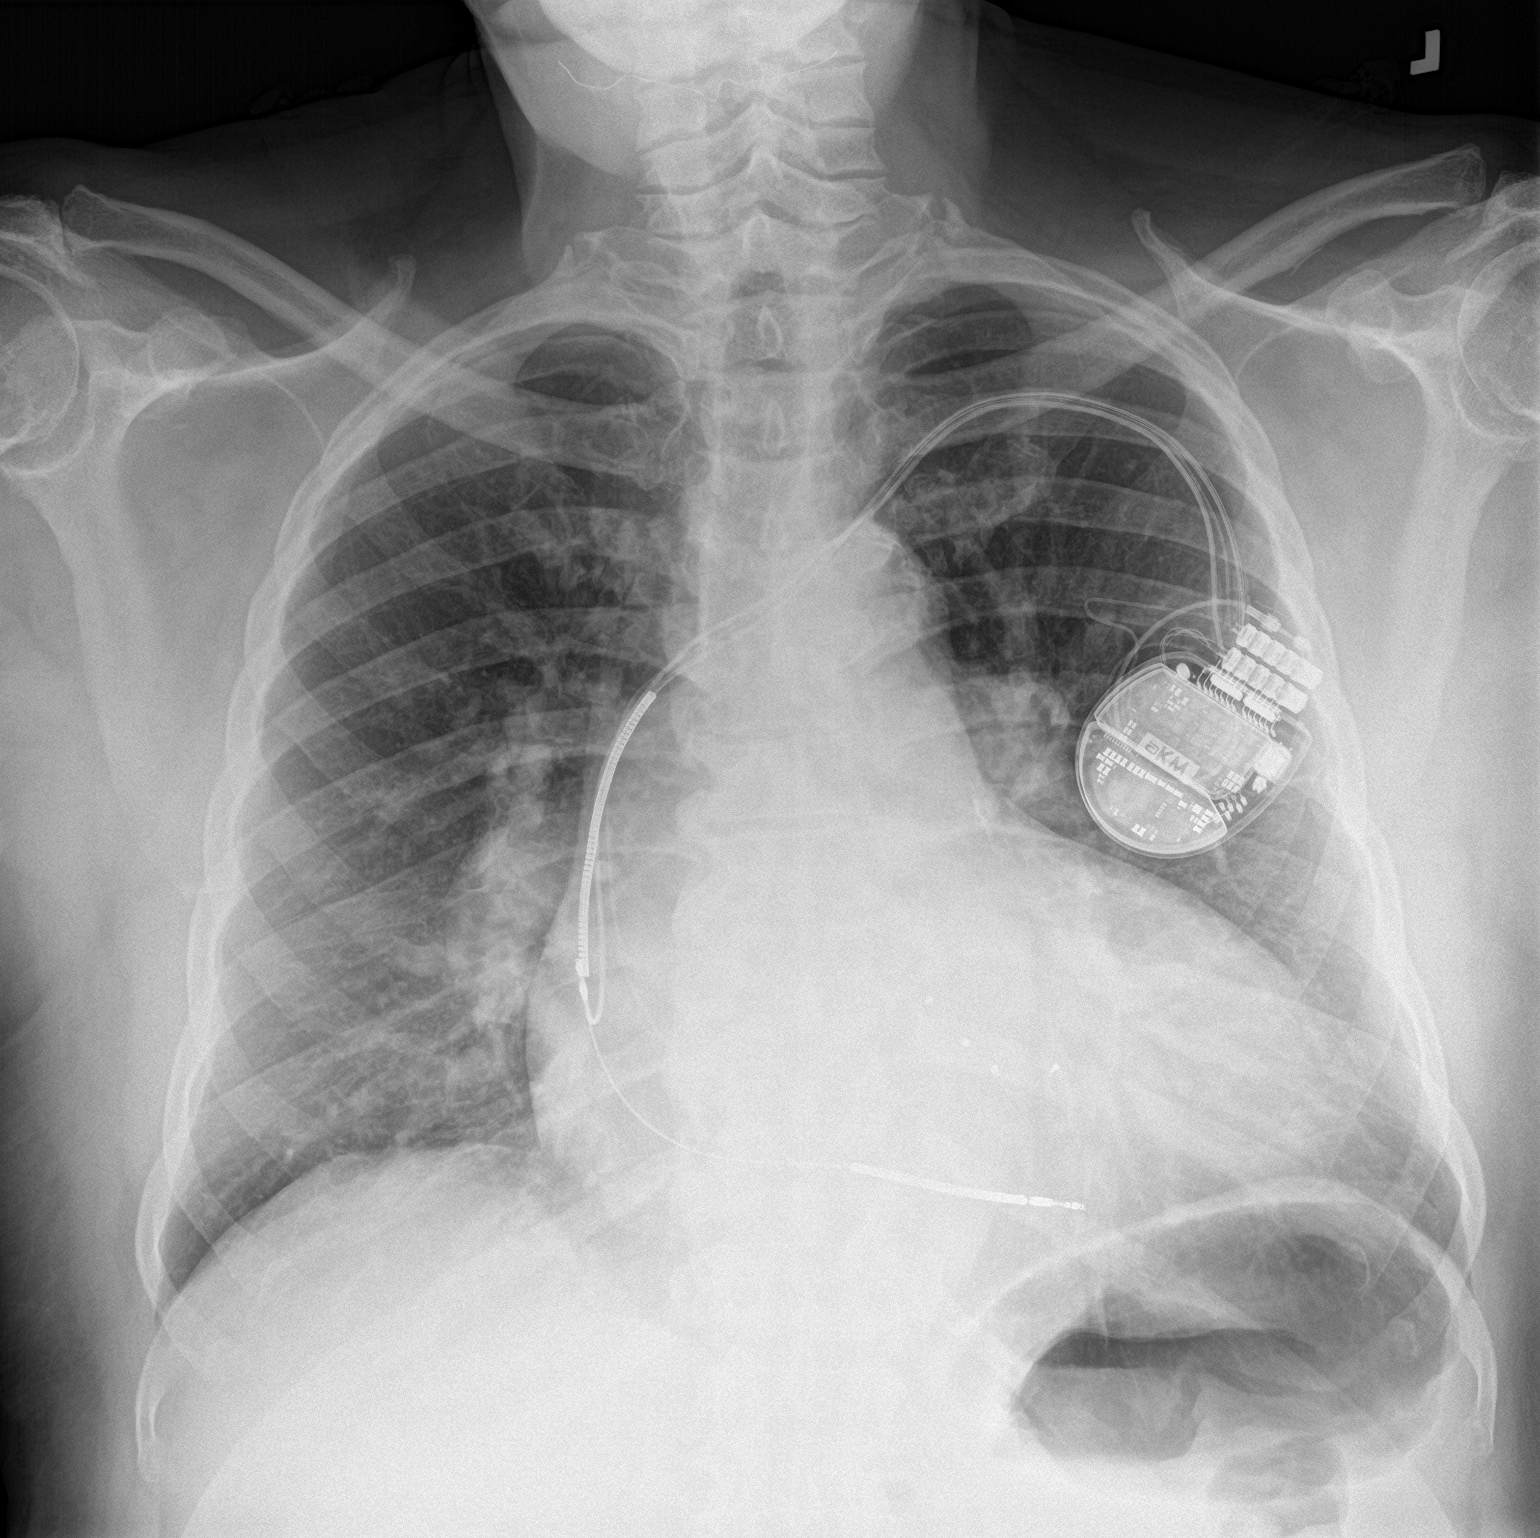

[chest lat]
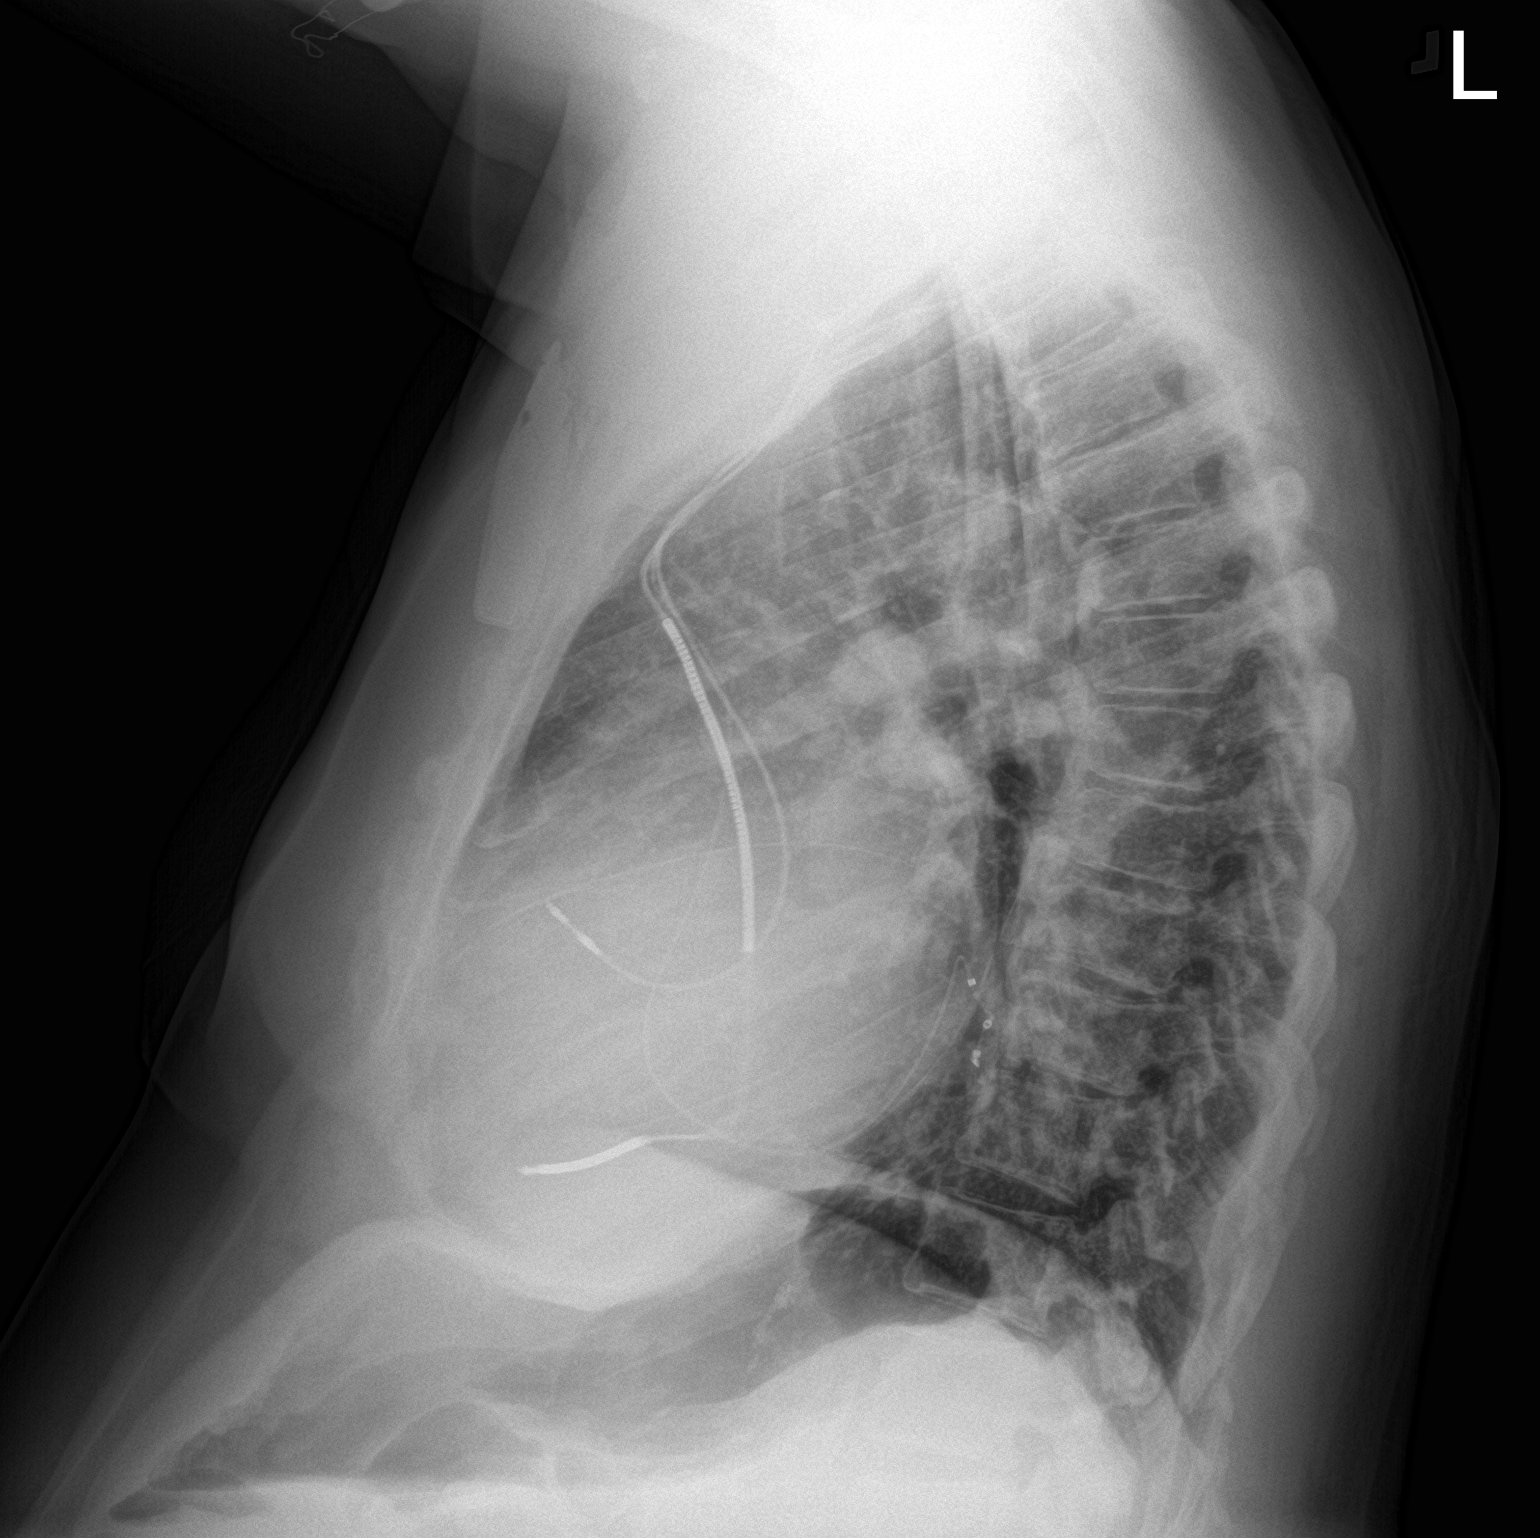

[2 of 2 positions shown; findings below may reference images not displayed]

FINDINGS: Again seen is the dual lead left subclavian pacemaker, stable.
Moderate cardiomegaly. Moderate central pulmonary vascular
congestion without significant interval change. No consolidation,
pleural effusion or pneumothorax. Moderate thoracic spondylosis.
IMPRESSION: Moderate cardiomegaly and pulmonary vascular congestion without
significant interval change.

## 2022-07-09 ENCOUNTER — Other Ambulatory Visit: Payer: Self-pay | Admitting: Physician Assistant

## 2022-07-09 DIAGNOSIS — E782 Mixed hyperlipidemia: Secondary | ICD-10-CM

## 2022-07-13 ENCOUNTER — Ambulatory Visit (INDEPENDENT_AMBULATORY_CARE_PROVIDER_SITE_OTHER): Payer: Medicare Other

## 2022-07-13 DIAGNOSIS — I5022 Chronic systolic (congestive) heart failure: Secondary | ICD-10-CM | POA: Diagnosis not present

## 2022-07-13 DIAGNOSIS — Z95810 Presence of automatic (implantable) cardiac defibrillator: Secondary | ICD-10-CM | POA: Diagnosis not present

## 2022-07-16 ENCOUNTER — Telehealth: Payer: Self-pay

## 2022-07-16 NOTE — Telephone Encounter (Signed)
Remote ICM transmission received.  Attempted call to patient regarding ICM remote transmission and no answer and no answering machine!

## 2022-07-16 NOTE — Progress Notes (Signed)
EPIC Encounter for ICM Monitoring  Patient Name: Earl Sliter Sr. is a 82 y.o. male Date: 07/16/2022 Primary Care Physican: Lavada Mesi Primary Cardiologist: [name] Electrophysiologist: Marisa Sprinkles Pacing:  97% 01/13/2022 Weight: 238 lbs 02/17/2022 Weight: 228 lbs 03/26/2022 Weight: 228 lbs 05/08/2022 Office Weight: 232   AT/AF Burden <1%            Attempted call to patient and unable to reach.. Transmission reviewed.    CorVue thoracic impedance suggesting normal fluid levels.     Prescribed:   Furosemide 40 mg 1 tablet (40 mg total) daily.    Spironolactone 25 mg take 0.5 tablet (12.5 mg total) daily Eliquis 5 mg take 1 tablet twice a day.   Labs: 02/03/2022 Creatinine 1.34, BUN 18, Potassium 5.3, Sodium 143, GFR 53 11/03/2021 Creatinine 1.23, BUN 18, Potassium 4.3, Sodium 143, GFR 59 07/25/2021 Creatinine 1.25, BUN 27, Potassium 4.1, Sodium 143, GFR 58  A complete set of results can be found in Results Review.   Recommendations:  Unable to reach.     Follow-up plan: ICM clinic phone appointment on 08/18/2022.   91 day device clinic remote transmission 07/24/2022.     EP/Cardiology Office Visits:  Recall 11/04/2022 with Dr [name].  Recall 01/03/2023 with Oda Kilts, PA.   Missed appointments on 04/09/2022 & 05/26/2022 with EP PA for follow up on 04/07/22 ICD shock but was discussed by Dr [name] at 05/08/2022 OV and started on Amiodarone.    Copy of ICM check sent to Dr. Quentin Ore.    3 month ICM trend: 07/13/2022.    12-14 Month ICM trend:     Rosalene Billings, RN 07/16/2022 10:36 AM

## 2022-07-24 ENCOUNTER — Ambulatory Visit (INDEPENDENT_AMBULATORY_CARE_PROVIDER_SITE_OTHER): Payer: Medicare Other

## 2022-07-24 DIAGNOSIS — I255 Ischemic cardiomyopathy: Secondary | ICD-10-CM

## 2022-07-24 LAB — CUP PACEART REMOTE DEVICE CHECK
Battery Remaining Longevity: 71 mo
Battery Remaining Percentage: 75 %
Battery Voltage: 2.98 V
Brady Statistic AP VP Percent: 93 %
Brady Statistic AP VS Percent: 1.2 %
Brady Statistic AS VP Percent: 4.5 %
Brady Statistic AS VS Percent: 1.6 %
Brady Statistic RA Percent Paced: 93 %
Date Time Interrogation Session: 20240126020041
HighPow Impedance: 44 Ohm
Implantable Lead Connection Status: 753985
Implantable Lead Connection Status: 753985
Implantable Lead Connection Status: 753985
Implantable Lead Implant Date: 20140117
Implantable Lead Implant Date: 20140117
Implantable Lead Implant Date: 20140117
Implantable Lead Location: 753858
Implantable Lead Location: 753859
Implantable Lead Location: 753860
Implantable Pulse Generator Implant Date: 20220426
Lead Channel Impedance Value: 390 Ohm
Lead Channel Impedance Value: 400 Ohm
Lead Channel Impedance Value: 780 Ohm
Lead Channel Pacing Threshold Amplitude: 0.75 V
Lead Channel Pacing Threshold Amplitude: 0.875 V
Lead Channel Pacing Threshold Amplitude: 1 V
Lead Channel Pacing Threshold Pulse Width: 0.5 ms
Lead Channel Pacing Threshold Pulse Width: 0.5 ms
Lead Channel Pacing Threshold Pulse Width: 0.5 ms
Lead Channel Sensing Intrinsic Amplitude: 12 mV
Lead Channel Sensing Intrinsic Amplitude: 5 mV
Lead Channel Setting Pacing Amplitude: 1.375
Lead Channel Setting Pacing Amplitude: 1.75 V
Lead Channel Setting Pacing Amplitude: 2 V
Lead Channel Setting Pacing Pulse Width: 0.5 ms
Lead Channel Setting Pacing Pulse Width: 0.5 ms
Lead Channel Setting Sensing Sensitivity: 0.5 mV
Pulse Gen Serial Number: 810027402

## 2022-08-07 ENCOUNTER — Ambulatory Visit: Payer: Medicare Other | Admitting: Physician Assistant

## 2022-08-10 NOTE — Progress Notes (Signed)
Remote ICD transmission.   

## 2022-08-18 ENCOUNTER — Ambulatory Visit: Payer: Medicare Other | Attending: Cardiology

## 2022-08-18 ENCOUNTER — Telehealth: Payer: Self-pay

## 2022-08-18 DIAGNOSIS — I5022 Chronic systolic (congestive) heart failure: Secondary | ICD-10-CM | POA: Diagnosis not present

## 2022-08-18 DIAGNOSIS — Z95810 Presence of automatic (implantable) cardiac defibrillator: Secondary | ICD-10-CM | POA: Diagnosis not present

## 2022-08-18 NOTE — Telephone Encounter (Signed)
Remote ICM transmission received.  Attempted call to patient regarding ICM remote transmission and no answer or voice mail option.  

## 2022-08-18 NOTE — Progress Notes (Signed)
EPIC Encounter for ICM Monitoring  Patient Name: Earl White. is a 82 y.o. male Date: 08/18/2022 Primary Care Physican: Lavada Mesi Primary Cardiologist: [name] Electrophysiologist: Marisa Sprinkles Pacing:  97% 01/13/2022 Weight: 238 lbs 02/17/2022 Weight: 228 lbs 03/26/2022 Weight: 228 lbs 05/08/2022 Office Weight: 232   AT/AF Burden <1%            Attempted call to patient and unable to reach.   Transmission reviewed.    CorVue thoracic impedance suggesting possible fluid accumulation starting 2/12 but trending back toward baseline.     Prescribed:   Furosemide 40 mg 1 tablet (40 mg total) daily.    Spironolactone 25 mg take 0.5 tablet (12.5 mg total) daily Eliquis 5 mg take 1 tablet twice a day.   Labs: 02/03/2022 Creatinine 1.34, BUN 18, Potassium 5.3, Sodium 143, GFR 53 11/03/2021 Creatinine 1.23, BUN 18, Potassium 4.3, Sodium 143, GFR 59 07/25/2021 Creatinine 1.25, BUN 27, Potassium 4.1, Sodium 143, GFR 58  A complete set of results can be found in Results Review.   Recommendations:  Unable to reach.     Follow-up plan: ICM clinic phone appointment on 09/21/2022.   91 day device clinic remote transmission 10/23/2022.     EP/Cardiology Office Visits:  Recall 11/04/2022 with Dr [name].  Recall 01/03/2023 with Oda Kilts, PA.      Copy of ICM check sent to Dr. Quentin Ore.    3 month ICM trend: 08/18/2022.    12-14 Month ICM trend:     Rosalene Billings, RN 08/18/2022 8:06 AM

## 2022-08-25 ENCOUNTER — Other Ambulatory Visit: Payer: Self-pay | Admitting: Physician Assistant

## 2022-08-25 DIAGNOSIS — I420 Dilated cardiomyopathy: Secondary | ICD-10-CM

## 2022-08-25 DIAGNOSIS — I5023 Acute on chronic systolic (congestive) heart failure: Secondary | ICD-10-CM

## 2022-09-21 ENCOUNTER — Ambulatory Visit: Payer: Medicare Other | Attending: Cardiology

## 2022-09-21 DIAGNOSIS — Z95810 Presence of automatic (implantable) cardiac defibrillator: Secondary | ICD-10-CM | POA: Diagnosis not present

## 2022-09-21 DIAGNOSIS — I5022 Chronic systolic (congestive) heart failure: Secondary | ICD-10-CM

## 2022-09-21 LAB — CUP PACEART REMOTE DEVICE CHECK
Battery Remaining Longevity: 70 mo
Battery Remaining Percentage: 73 %
Battery Voltage: 2.98 V
Brady Statistic AP VP Percent: 94 %
Brady Statistic AP VS Percent: 1.2 %
Brady Statistic AS VP Percent: 3.7 %
Brady Statistic AS VS Percent: 1.3 %
Brady Statistic RA Percent Paced: 94 %
Date Time Interrogation Session: 20240325030033
HighPow Impedance: 43 Ohm
Implantable Lead Connection Status: 753985
Implantable Lead Connection Status: 753985
Implantable Lead Connection Status: 753985
Implantable Lead Implant Date: 20140117
Implantable Lead Implant Date: 20140117
Implantable Lead Implant Date: 20140117
Implantable Lead Location: 753858
Implantable Lead Location: 753859
Implantable Lead Location: 753860
Implantable Pulse Generator Implant Date: 20220426
Lead Channel Impedance Value: 400 Ohm
Lead Channel Impedance Value: 410 Ohm
Lead Channel Impedance Value: 800 Ohm
Lead Channel Pacing Threshold Amplitude: 0.75 V
Lead Channel Pacing Threshold Amplitude: 0.875 V
Lead Channel Pacing Threshold Amplitude: 1.125 V
Lead Channel Pacing Threshold Pulse Width: 0.5 ms
Lead Channel Pacing Threshold Pulse Width: 0.5 ms
Lead Channel Pacing Threshold Pulse Width: 0.5 ms
Lead Channel Sensing Intrinsic Amplitude: 12 mV
Lead Channel Sensing Intrinsic Amplitude: 5 mV
Lead Channel Setting Pacing Amplitude: 1.375
Lead Channel Setting Pacing Amplitude: 1.75 V
Lead Channel Setting Pacing Amplitude: 2.125
Lead Channel Setting Pacing Pulse Width: 0.5 ms
Lead Channel Setting Pacing Pulse Width: 0.5 ms
Lead Channel Setting Sensing Sensitivity: 0.5 mV
Pulse Gen Serial Number: 810027402

## 2022-09-25 NOTE — Progress Notes (Signed)
EPIC Encounter for ICM Monitoring  Patient Name: Earl Carda Sr. is a 82 y.o. male Date: 09/25/2022 Primary Care Physican: Lavada Mesi Primary Cardiologist: [name] Electrophysiologist: Marisa Sprinkles Pacing:  97% 03/26/2022 Weight: 228 lbs 06/10/2022 Office Weight: 238   AT/AF Burden <1%           Transmission reviewed.    CorVue thoracic impedance suggesting possible fluid accumulation starting 2/12 but trending back toward baseline.     Prescribed:   Furosemide 40 mg 1 tablet (40 mg total) daily.    Spironolactone 25 mg take 0.5 tablet (12.5 mg total) daily Eliquis 5 mg take 1 tablet twice a day.   Labs: 02/03/2022 Creatinine 1.34, BUN 18, Potassium 5.3, Sodium 143, GFR 53 11/03/2021 Creatinine 1.23, BUN 18, Potassium 4.3, Sodium 143, GFR 59 07/25/2021 Creatinine 1.25, BUN 27, Potassium 4.1, Sodium 143, GFR 58  A complete set of results can be found in Results Review.   Recommendations:  No changes.     Follow-up plan: ICM clinic phone appointment on 10/27/2022.   91 day device clinic remote transmission 10/23/2022.     EP/Cardiology Office Visits:  Recall 11/04/2022 with Dr [name].  Recall 01/03/2023 with Oda Kilts, PA.      Copy of ICM check sent to Dr. Quentin Ore.     3 month ICM trend: 09/21/2022.    12-14 Month ICM trend:     Rosalene Billings, RN 09/25/2022 12:21 PM

## 2022-09-28 ENCOUNTER — Telehealth: Payer: Self-pay

## 2022-09-28 DIAGNOSIS — I4901 Ventricular fibrillation: Secondary | ICD-10-CM

## 2022-09-28 DIAGNOSIS — I5022 Chronic systolic (congestive) heart failure: Secondary | ICD-10-CM

## 2022-09-28 NOTE — Telephone Encounter (Signed)
Discussed/reviewed with Oda Kilts.  Pt needs a CMP, mag, TSH and follow up ASAP.

## 2022-09-28 NOTE — Telephone Encounter (Signed)
Attempted to contact Pt.  Unable to leave a message.

## 2022-09-28 NOTE — Telephone Encounter (Signed)
Alert received from [facility]:  Device alert for successful HV therapy Event occurred 3/29 @ 19:54, EGM show AF/AFL changing to 1:1 conduction with slight irregularity, rhythm becomes more regular, ATP delivered x6, rhythm deteriorates to VF, HV therapy 850V delivered converting to regular AP/BiV pace. Duration 19min 6sec.  2 additional NSVT prior to event AMS for AF, longest duration 96min 34sec, Eliquis per EPIC

## 2022-09-29 NOTE — Telephone Encounter (Signed)
Attempted to contact patient. Unable to leave VM d/t phone ringing continuously.

## 2022-09-30 ENCOUNTER — Other Ambulatory Visit: Payer: Self-pay

## 2022-09-30 DIAGNOSIS — I4901 Ventricular fibrillation: Secondary | ICD-10-CM

## 2022-09-30 DIAGNOSIS — I5022 Chronic systolic (congestive) heart failure: Secondary | ICD-10-CM

## 2022-09-30 NOTE — Telephone Encounter (Signed)
Patient scheduled Friday with Oda Kilts

## 2022-09-30 NOTE — Telephone Encounter (Signed)
Spoke with patient, patient stated that he had not missed any medications, patient stated he would go to [facility] to get labs done. Patient voiced understanding of driving restrictions

## 2022-10-01 ENCOUNTER — Other Ambulatory Visit: Payer: Self-pay | Admitting: Physician Assistant

## 2022-10-01 ENCOUNTER — Other Ambulatory Visit: Payer: Self-pay | Admitting: Family Medicine

## 2022-10-01 DIAGNOSIS — I4901 Ventricular fibrillation: Secondary | ICD-10-CM | POA: Diagnosis not present

## 2022-10-01 DIAGNOSIS — I10 Essential (primary) hypertension: Secondary | ICD-10-CM

## 2022-10-01 DIAGNOSIS — I420 Dilated cardiomyopathy: Secondary | ICD-10-CM

## 2022-10-01 DIAGNOSIS — I5023 Acute on chronic systolic (congestive) heart failure: Secondary | ICD-10-CM

## 2022-10-01 NOTE — Progress Notes (Signed)
Labs ordered by Dr. Wynonia Lawman but he came here to have them drawn.  Reentered lab for CMP and we can forward to Dr. Wynonia Lawman once we get those results back in.  Orders Placed This Encounter  Procedures   COMPLETE METABOLIC PANEL WITH GFR

## 2022-10-02 ENCOUNTER — Encounter: Payer: Self-pay | Admitting: Student

## 2022-10-02 ENCOUNTER — Ambulatory Visit: Payer: Medicare Other | Attending: Student | Admitting: Student

## 2022-10-02 VITALS — BP 132/78 | HR 69 | Ht 70.0 in | Wt 235.0 lb

## 2022-10-02 DIAGNOSIS — Z95810 Presence of automatic (implantable) cardiac defibrillator: Secondary | ICD-10-CM | POA: Insufficient documentation

## 2022-10-02 DIAGNOSIS — I472 Ventricular tachycardia, unspecified: Secondary | ICD-10-CM | POA: Insufficient documentation

## 2022-10-02 DIAGNOSIS — I255 Ischemic cardiomyopathy: Secondary | ICD-10-CM | POA: Diagnosis not present

## 2022-10-02 DIAGNOSIS — I5022 Chronic systolic (congestive) heart failure: Secondary | ICD-10-CM | POA: Insufficient documentation

## 2022-10-02 LAB — CUP PACEART INCLINIC DEVICE CHECK
Battery Remaining Longevity: 66 mo
Brady Statistic RA Percent Paced: 94 %
Brady Statistic RV Percent Paced: 97 %
Date Time Interrogation Session: 20240405125751
HighPow Impedance: 40 Ohm
Implantable Lead Connection Status: 753985
Implantable Lead Connection Status: 753985
Implantable Lead Connection Status: 753985
Implantable Lead Implant Date: 20140117
Implantable Lead Implant Date: 20140117
Implantable Lead Implant Date: 20140117
Implantable Lead Location: 753858
Implantable Lead Location: 753859
Implantable Lead Location: 753860
Implantable Pulse Generator Implant Date: 20220426
Lead Channel Impedance Value: 412.5 Ohm
Lead Channel Impedance Value: 425 Ohm
Lead Channel Impedance Value: 800 Ohm
Lead Channel Pacing Threshold Amplitude: 0.75 V
Lead Channel Pacing Threshold Amplitude: 0.75 V
Lead Channel Pacing Threshold Amplitude: 0.875 V
Lead Channel Pacing Threshold Amplitude: 1.125 V
Lead Channel Pacing Threshold Pulse Width: 0.5 ms
Lead Channel Pacing Threshold Pulse Width: 0.5 ms
Lead Channel Pacing Threshold Pulse Width: 0.5 ms
Lead Channel Pacing Threshold Pulse Width: 0.5 ms
Lead Channel Sensing Intrinsic Amplitude: 12 mV
Lead Channel Sensing Intrinsic Amplitude: 5 mV
Lead Channel Setting Pacing Amplitude: 1.375
Lead Channel Setting Pacing Amplitude: 1.75 V
Lead Channel Setting Pacing Amplitude: 2.125
Lead Channel Setting Pacing Pulse Width: 0.5 ms
Lead Channel Setting Pacing Pulse Width: 0.5 ms
Lead Channel Setting Sensing Sensitivity: 0.5 mV
Pulse Gen Serial Number: 810027402

## 2022-10-02 LAB — COMPLETE METABOLIC PANEL WITH GFR
AG Ratio: 1.7 (calc) (ref 1.0–2.5)
ALT: 18 U/L (ref 9–46)
AST: 20 U/L (ref 10–35)
Albumin: 4 g/dL (ref 3.6–5.1)
Alkaline phosphatase (APISO): 72 U/L (ref 35–144)
BUN: 18 mg/dL (ref 7–25)
CO2: 27 mmol/L (ref 20–32)
Calcium: 9.6 mg/dL (ref 8.6–10.3)
Chloride: 107 mmol/L (ref 98–110)
Creat: 1.22 mg/dL (ref 0.70–1.22)
Globulin: 2.3 g/dL (calc) (ref 1.9–3.7)
Glucose, Bld: 119 mg/dL — ABNORMAL HIGH (ref 65–99)
Potassium: 4.4 mmol/L (ref 3.5–5.3)
Sodium: 144 mmol/L (ref 135–146)
Total Bilirubin: 0.6 mg/dL (ref 0.2–1.2)
Total Protein: 6.3 g/dL (ref 6.1–8.1)
eGFR: 60 mL/min/{1.73_m2} (ref >=60)

## 2022-10-02 MED ORDER — AMIODARONE HCL 200 MG PO TABS
200.0000 mg | ORAL_TABLET | Freq: Every day | ORAL | 3 refills | Status: DC
Start: 2022-10-02 — End: 2023-10-21

## 2022-10-02 MED ORDER — AMIODARONE HCL 200 MG PO TABS
ORAL_TABLET | ORAL | 0 refills | Status: DC
Start: 2022-10-02 — End: 2023-02-10

## 2022-10-02 NOTE — Progress Notes (Signed)
  Electrophysiology Office Note:   Date:  10/02/2022  ID:  [name], DOB Feb 27, 1941, MRN [id number]  Primary Cardiologist: Olga Millers, MD Electrophysiologist: Lanier Prude, MD   History of Present Illness:   Vasil Riedell Sr. is a 82 y.o. male with h/o PAF, presumed CAD, Chronic systolic CHF, VT, ICD, HTN, HLD, and h/o AS seen today for acute visit due to ICD shock.    Patient reports he was his USOH until last week, and felt good the day of his shock. He had bent down to clean his car and felt very dizzy. Sat down in a chair and passed out. He initially reported med compliance, but as we went down his list again today he realized he only took the initial fill of amidoarone and stopped it 2-3 months ago. He has been on his other meds to the best of his knowledge. He denies chest pain, palpitations, dyspnea, PND, orthopnea, nausea, vomiting, dizziness, edema, weight gain, or early satiety.   Review of systems complete and found to be negative unless listed in HPI.   Device History: STJ CRTD implanted 2014, gen change 10/22/20 for NICM and CHF History of appropriate therapy: Yes History of AAD therapy: No  Studies Reviewed:    ICD Interrogation-  reviewed in detail today,  See PACEART report.  EKG is ordered today. Personal review shows AV dual pacing at 69 bpm   Risk Assessment/Calculations:            Physical Exam:   VS:  BP 132/78   Pulse 69   Ht 5\' 10"  (1.778 m)   Wt 235 lb (106.6 kg)   SpO2 97%   BMI 33.72 kg/m    Wt Readings from Last 3 Encounters:  10/02/22 235 lb (106.6 kg)  06/10/22 238 lb 6.4 oz (108.1 kg)  05/08/22 232 lb (105.2 kg)     GEN: Well nourished, well developed in no acute distress NECK: No JVD; No carotid bruits CARDIAC: Regular rate and rhythm, no murmurs, rubs, gallops RESPIRATORY:  Clear to auscultation without rales, wheezing or rhonchi  ABDOMEN: Soft, non-tender, non-distended EXTREMITIES:  No edema; No deformity   ASSESSMENT  AND PLAN:    Chronic systolic dysfunction s/p Abbott CRT-D  Echo 01/2022 LVEF 20-25% euvolemic today Stable on an appropriate medical regimen Normal ICD function See [facility] report No changes today  VT/VF with ICD shock 3/29 with atrial arrhyhtmia with RVR vs dual tachycardia -> ATP - > fast VT /VF (CL 240) and shock.   Prior shocks in 8/23 and 10/23. Continue coreg 25 mg BID. If recurs, consider switching to max dose Toprol.  In discussion, he admits to taking the initial rx of amiodarone he was prescribed in the fall, and then stopping. He has been out for ~ 3 months.  Restart amiodarone 200 mg BID x 2 weeks, then 200 mg daily.  Labs today. CMET done yesterday but no TFTs or Mg.   Paroxysmal AF/AFL Continue eliquis 5 mg BID for CHA2DS2/VASc of at least 5.  Amiodarone as above.   Cardiomyopathy ? CAD Previous nuclear study showed infarct but no ischemia. He refused catheterization. If VT recurs on amiodarone, would recommend heart cath.   Disposition:   Follow up with EP APP  in 6 weeks.     Signed, Graciella Freer, PA-C

## 2022-10-02 NOTE — Progress Notes (Signed)
Your lab work is within acceptable range and there are no concerning findings.   ?

## 2022-10-02 NOTE — Patient Instructions (Signed)
Medication Instructions:  1.Take amiodarone 200 mg twice a day for 2 weeks, then 200 mg daily thereafter *If you need a refill on your cardiac medications before your next appointment, please call your pharmacy*  Lab Work: CMET, TSH, FREE T4--TODAY If you have labs (blood work) drawn today and your tests are completely normal, you will receive your results only by: MyChart Message (if you have MyChart) OR A paper copy in the mail If you have any lab test that is abnormal or we need to change your treatment, we will call you to review the results.  Follow-Up: At [facility], you and your health needs are our priority.  As part of our continuing mission to provide you with exceptional heart care, we have created designated Provider Care Teams.  These Care Teams include your primary Cardiologist (physician) and Advanced Practice Providers (APPs -  Physician Assistants and Nurse Practitioners) who all work together to provide you with the care you need, when you need it.  We recommend signing up for the patient portal called "MyChart".  Sign up information is provided on this After Visit Summary.  MyChart is used to connect with patients for Virtual Visits (Telemedicine).  Patients are able to view lab/test results, encounter notes, upcoming appointments, etc.  Non-urgent messages can be sent to your provider as well.   To learn more about what you can do with MyChart, go to [email or web address].    Your next appointment:   6 week(s)  Provider:   Casimiro Needle "Otilio Saber, PA-C

## 2022-10-03 LAB — COMPREHENSIVE METABOLIC PANEL
ALT: 20 IU/L (ref 0–44)
AST: 26 IU/L (ref 0–40)
Albumin/Globulin Ratio: 1.8 (ref 1.2–2.2)
Albumin: 4.2 g/dL (ref 3.7–4.7)
Alkaline Phosphatase: 90 IU/L (ref 44–121)
BUN/Creatinine Ratio: 15 (ref 10–24)
BUN: 17 mg/dL (ref 8–27)
Bilirubin Total: 0.7 mg/dL (ref 0.0–1.2)
CO2: 24 mmol/L (ref 20–29)
Calcium: 9.7 mg/dL (ref 8.6–10.2)
Chloride: 105 mmol/L (ref 96–106)
Creatinine, Ser: 1.16 mg/dL (ref 0.76–1.27)
Globulin, Total: 2.3 g/dL (ref 1.5–4.5)
Glucose: 99 mg/dL (ref 70–99)
Potassium: 4.7 mmol/L (ref 3.5–5.2)
Sodium: 143 mmol/L (ref 134–144)
Total Protein: 6.5 g/dL (ref 6.0–8.5)
eGFR: 63 mL/min/{1.73_m2} (ref >59)

## 2022-10-03 LAB — T4, FREE: Free T4: 1.52 ng/dL (ref 0.82–1.77)

## 2022-10-03 LAB — MAGNESIUM: Magnesium: 1.8 mg/dL (ref 1.6–2.3)

## 2022-10-03 LAB — TSH: TSH: 1.83 u[IU]/mL (ref 0.450–4.500)

## 2022-10-06 ENCOUNTER — Other Ambulatory Visit: Payer: Self-pay

## 2022-10-06 DIAGNOSIS — I472 Ventricular tachycardia, unspecified: Secondary | ICD-10-CM

## 2022-10-06 MED ORDER — MAGNESIUM OXIDE -MG SUPPLEMENT 400 MG PO CAPS
400.0000 mg | ORAL_CAPSULE | Freq: Every day | ORAL | 11 refills | Status: DC
Start: 2022-10-06 — End: 2023-10-18

## 2022-10-07 ENCOUNTER — Other Ambulatory Visit: Payer: Self-pay | Admitting: Physician Assistant

## 2022-10-07 DIAGNOSIS — I10 Essential (primary) hypertension: Secondary | ICD-10-CM

## 2022-10-07 DIAGNOSIS — I5023 Acute on chronic systolic (congestive) heart failure: Secondary | ICD-10-CM

## 2022-10-07 DIAGNOSIS — I420 Dilated cardiomyopathy: Secondary | ICD-10-CM

## 2022-10-08 NOTE — Addendum Note (Signed)
Addended by: Lacy Duverney R on: 10/08/2022 02:14 PM   Modules accepted: Orders

## 2022-10-23 ENCOUNTER — Ambulatory Visit (INDEPENDENT_AMBULATORY_CARE_PROVIDER_SITE_OTHER): Payer: Medicare Other

## 2022-10-23 DIAGNOSIS — I472 Ventricular tachycardia, unspecified: Secondary | ICD-10-CM | POA: Diagnosis not present

## 2022-10-23 LAB — CUP PACEART REMOTE DEVICE CHECK
Battery Remaining Longevity: 68 mo
Battery Remaining Percentage: 72 %
Battery Voltage: 2.98 V
Brady Statistic AP VP Percent: 95 %
Brady Statistic AP VS Percent: 3.2 %
Brady Statistic AS VP Percent: 1 %
Brady Statistic AS VS Percent: 1 %
Brady Statistic RA Percent Paced: 96 %
Date Time Interrogation Session: 20240426020005
HighPow Impedance: 43 Ohm
Implantable Lead Connection Status: 753985
Implantable Lead Connection Status: 753985
Implantable Lead Connection Status: 753985
Implantable Lead Implant Date: 20140117
Implantable Lead Implant Date: 20140117
Implantable Lead Implant Date: 20140117
Implantable Lead Location: 753858
Implantable Lead Location: 753859
Implantable Lead Location: 753860
Implantable Pulse Generator Implant Date: 20220426
Lead Channel Impedance Value: 410 Ohm
Lead Channel Impedance Value: 430 Ohm
Lead Channel Impedance Value: 810 Ohm
Lead Channel Pacing Threshold Amplitude: 0.875 V
Lead Channel Pacing Threshold Amplitude: 1 V
Lead Channel Pacing Threshold Amplitude: 1 V
Lead Channel Pacing Threshold Pulse Width: 0.5 ms
Lead Channel Pacing Threshold Pulse Width: 0.5 ms
Lead Channel Pacing Threshold Pulse Width: 0.5 ms
Lead Channel Sensing Intrinsic Amplitude: 12 mV
Lead Channel Sensing Intrinsic Amplitude: 5 mV
Lead Channel Setting Pacing Amplitude: 1.5 V
Lead Channel Setting Pacing Amplitude: 1.875
Lead Channel Setting Pacing Amplitude: 2 V
Lead Channel Setting Pacing Pulse Width: 0.5 ms
Lead Channel Setting Pacing Pulse Width: 0.5 ms
Lead Channel Setting Sensing Sensitivity: 0.5 mV
Pulse Gen Serial Number: 810027402

## 2022-10-27 ENCOUNTER — Ambulatory Visit: Payer: Medicare Other | Attending: Cardiology

## 2022-10-27 DIAGNOSIS — I5022 Chronic systolic (congestive) heart failure: Secondary | ICD-10-CM | POA: Diagnosis not present

## 2022-10-27 DIAGNOSIS — Z95810 Presence of automatic (implantable) cardiac defibrillator: Secondary | ICD-10-CM | POA: Diagnosis not present

## 2022-10-30 ENCOUNTER — Telehealth: Payer: Self-pay | Admitting: Physician Assistant

## 2022-10-30 ENCOUNTER — Other Ambulatory Visit: Payer: Self-pay | Admitting: Physician Assistant

## 2022-10-30 NOTE — Progress Notes (Signed)
EPIC Encounter for ICM Monitoring  Patient Name: Earl Hodgeman Sr. is a 82 y.o. male Date: 10/30/2022 Primary Care Physican: Nolene Ebbs Primary Cardiologist: Jens Som Electrophysiologist: Townsend Roger Pacing:  95% 03/26/2022 Weight: 228 lbs 06/10/2022 Office Weight: 238 10/02/2022 Office Weight: 235 lbs   AT/AF Burden <1%           Transmission reviewed.    CorVue thoracic impedance suggesting intermittent days with possible fluid accumulation within the last month.     Prescribed:   Furosemide 40 mg 1 tablet (40 mg total) daily.    Spironolactone 25 mg take 0.5 tablet (12.5 mg total) daily Eliquis 5 mg take 1 tablet twice a day.   Labs: 10/02/2022 Creatinine 1.16, BUN 17, Potassium 4.7, Sodium 143, GFR 63 10/01/2022 Creatinine 1.22, BUN 18, potassium 4.4, Sodium 144, GFR 60 A complete set of results can be found in Results Review.   Recommendations:  No changes.     Follow-up plan: ICM clinic phone appointment on 11/30/2022.   91 day device clinic remote transmission 01/22/2023.     EP/Cardiology Office Visits:  Recall 11/04/2022 with Dr Jens Som.  11/11/2022 with Otilio Saber, PA.      Copy of ICM check sent to Dr. Lalla Brothers.     3 month ICM trend: 10/27/2022.    12-14 Month ICM trend:     Earl Soda, RN 10/30/2022 2:41 PM

## 2022-10-30 NOTE — Telephone Encounter (Signed)
Looks like they are already scheduled with you on May 6

## 2022-11-02 ENCOUNTER — Ambulatory Visit (INDEPENDENT_AMBULATORY_CARE_PROVIDER_SITE_OTHER): Payer: Medicare Other | Admitting: Physician Assistant

## 2022-11-02 VITALS — BP 96/51 | HR 64 | Ht 70.0 in | Wt 234.0 lb

## 2022-11-02 DIAGNOSIS — E1122 Type 2 diabetes mellitus with diabetic chronic kidney disease: Secondary | ICD-10-CM

## 2022-11-02 DIAGNOSIS — M130 Polyarthritis, unspecified: Secondary | ICD-10-CM | POA: Diagnosis not present

## 2022-11-02 DIAGNOSIS — Z794 Long term (current) use of insulin: Secondary | ICD-10-CM | POA: Diagnosis not present

## 2022-11-02 DIAGNOSIS — I10 Essential (primary) hypertension: Secondary | ICD-10-CM | POA: Diagnosis not present

## 2022-11-02 DIAGNOSIS — I5023 Acute on chronic systolic (congestive) heart failure: Secondary | ICD-10-CM

## 2022-11-02 DIAGNOSIS — I420 Dilated cardiomyopathy: Secondary | ICD-10-CM | POA: Diagnosis not present

## 2022-11-02 DIAGNOSIS — I959 Hypotension, unspecified: Secondary | ICD-10-CM | POA: Diagnosis not present

## 2022-11-02 LAB — POCT GLYCOSYLATED HEMOGLOBIN (HGB A1C): Hemoglobin A1C: 6.6 % — AB (ref 4.0–5.6)

## 2022-11-02 MED ORDER — CARVEDILOL 25 MG PO TABS: 25.0000 mg | ORAL_TABLET | Freq: Two times a day (BID) | ORAL | 1 refills | Status: DC

## 2022-11-02 MED ORDER — DICLOFENAC SODIUM 1 % EX GEL
4.0000 g | Freq: Four times a day (QID) | CUTANEOUS | 1 refills | Status: DC
Start: 2022-11-02 — End: 2023-11-17

## 2022-11-02 MED ORDER — TRULICITY 1.5 MG/0.5ML ~~LOC~~ SOAJ
SUBCUTANEOUS | 1 refills | Status: DC
Start: 2022-11-02 — End: 2023-05-14

## 2022-11-02 NOTE — Patient Instructions (Addendum)
  Next appt: 11/11/2022 at 11:20 AM in Cardiology Graciella Freer, PA-C)    Gel to rub over joints that hurt

## 2022-11-02 NOTE — Progress Notes (Unsigned)
Established Patient Office Visit  Subjective   Patient ID: Earl Leedom Sr., male    DOB: 1940-08-29  Age: 82 y.o. MRN: [id number]  Chief Complaint  Patient presents with   Follow-up    HPI Pt is a 82 yo obese male with T2DM, HTN, CHF, HLD who presents to the clinic for follow up and medication refills.   Cardiology follows him closely. He does have ICD. Last event in February. Next appt in may for cardiology.   Overall doing ok. SOB is stable. He is not checking his sugars. He is compliant with medications. He does keep low sodium diet. Overall swelling of lower extremity has been controlled.   Pt does c/o achy joints in multiple sites: knees, hands, wrists, elbows, hips. He wonders if he can do anything for this.    Active Ambulatory Problems    Diagnosis Date Noted   Essential hypertension, benign 04/28/2013   BPH (benign prostatic hyperplasia) 04/28/2013   Gout 04/28/2013   Congestive dilated cardiomyopathy (HCC) 05/10/2013   ICD (implantable cardioverter-defibrillator) in place 05/10/2013   Elevated PSA 11/24/2013   Systolic dysfunction with acute on chronic heart failure (HCC) 09/03/2014   Morbid obesity (HCC) 09/03/2014   Erectile dysfunction 03/10/2015   Lower extremity edema 09/29/2017   Abnormal ultrasound of lower extremity 09/29/2017   Malignant neoplasm of prostate (HCC) 02/03/2018   Family history of cancer 02/03/2018   Family history of prostate cancer    Family history of breast cancer    Genetic testing 03/25/2018   Hypotension 05/23/2018   Paroxysmal atrial fibrillation (HCC) 12/25/2015   History of gout 08/01/2018   Type 2 diabetes mellitus with chronic kidney disease, 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HCC) 08/01/2018   Acute left-sided low back pain without sciatica 05/16/2019   Syncope and collapse 05/24/2019   Chronic systolic heart failure (HCC) 06/08/2019   Ventricular fibrillation (HCC) 08/16/2019   Fall 08/16/2019   Impetigo  08/18/2019   Rectal bleeding 12/11/2019   Mixed hyperlipidemia 12/11/2019   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   Vitamin D deficiency 11/03/2021   SOB (shortness of breath) 11/04/2021   Localized swelling of both lower extremities 11/04/2021   Restrictive pattern present on pulmonary function testing 11/18/2021   Arthritis of left wrist 02/04/2022   Left arm swelling 02/06/2022   Left wrist pain 02/06/2022   Arthritis of multiple sites 11/04/2022   Resolved Ambulatory Problems    Diagnosis Date Noted   Diabetes mellitus due to underlying condition, controlled, without complication, 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HCC) 04/28/2013   Past Medical History:  Diagnosis Date   CHF (congestive heart failure) (HCC)    Diabetes mellitus (HCC)    Hyperlipidemia    Hypertension    Nonischemic cardiomyopathy (HCC)    Prostate cancer (HCC)      ROS See HPI.    Objective:     BP (!) 96/51   Pulse 64   Ht 5\' 10"  (1.778 m)   Wt 234 lb (106.1 kg)   SpO2 99%   BMI 33.58 kg/m  BP Readings from Last 3 Encounters:  11/02/22 (!) 96/51  10/02/22 132/78  06/10/22 110/64   Wt Readings from Last 3 Encounters:  11/02/22 234 lb (106.1 kg)  10/02/22 235 lb (106.6 kg)  06/10/22 238 lb 6.4 oz (108.1 kg)      Physical Exam Constitutional:      Appearance: Normal appearance. He is obese.  HENT:     Head:  Normocephalic.  Neck:     Vascular: No carotid bruit.  Cardiovascular:     Rate and Rhythm: Normal rate and regular rhythm.     Heart sounds: Murmur heard.  Pulmonary:     Effort: Pulmonary effort is normal.     Breath sounds: Normal breath sounds.  Musculoskeletal:     Cervical back: Normal range of motion and neck supple. No tenderness.     Right lower leg: Edema present.     Left lower leg: Edema present.     Comments: 1+ bilateral pitting edema.   Lymphadenopathy:     Cervical: No cervical adenopathy.  Neurological:     General: No focal deficit  present.     Mental Status: He is alert and oriented to person, place, and time.  Psychiatric:        Mood and Affect: Mood normal.      Results for orders placed or performed in visit on 11/02/22  POCT HgB A1C  Result Value Ref Range   Hemoglobin A1C 6.6 (A) 4.0 - 5.6 %   HbA1c POC (<> result, manual entry)     HbA1c, POC (prediabetic range)     HbA1c, POC (controlled diabetic range)          Assessment & Plan:  Earl KitchenMarland KitchenClovis was seen today for follow-up.  Diagnoses and all orders for this visit:  Type 2 diabetes mellitus with chronic kidney disease, without long-term current use of insulin, unspecified CKD stage (HCC) -     POCT HgB A1C -     Dulaglutide (TRULICITY) 1.5 MG/0.5ML SOPN; INJECT 1.5 MG (0.5ML)  SUBCUTANEOUSLY ONCE A WEEK  Systolic dysfunction with acute on chronic heart failure (HCC) -     carvedilol (COREG) 25 MG tablet; Take 1 tablet (25 mg total) by mouth 2 (two) times daily with a meal.  Congestive dilated cardiomyopathy (HCC) -     carvedilol (COREG) 25 MG tablet; Take 1 tablet (25 mg total) by mouth 2 (two) times daily with a meal.  Essential hypertension, benign -     carvedilol (COREG) 25 MG tablet; Take 1 tablet (25 mg total) by mouth 2 (two) times daily with a meal.  Hypotension, unspecified hypotension type  Arthritis of multiple sites -     diclofenac Sodium (VOLTAREN) 1 % GEL; Apply 4 g topically 4 (four) times daily. To affected joint.   BP a little low today  Make sure drinking enough water to be hydrated If starting to feel dizzy eat something a little salty and drink something Consider wearing compression stockings: it would help with dizziness, BP, and lower ext edema.   A1C to goal at 6.6.  Continue trulicity Watch sugars and carbs On statin Needs eye exam Shingles needed but should get at pharmacy Flu and pneumonia vaccine UTD  Cardiology follows CHF.   Not candidate for oral NSAIDs Trial of voltaren gel for achy joints.     Earl Gaw, PA-C

## 2022-11-04 ENCOUNTER — Encounter: Payer: Self-pay | Admitting: Physician Assistant

## 2022-11-04 DIAGNOSIS — M130 Polyarthritis, unspecified: Secondary | ICD-10-CM | POA: Insufficient documentation

## 2022-11-11 ENCOUNTER — Encounter: Payer: Self-pay | Admitting: Student

## 2022-11-11 ENCOUNTER — Ambulatory Visit: Payer: Medicare Other | Attending: Student | Admitting: Student

## 2022-11-11 VITALS — BP 104/58 | HR 64 | Ht 70.0 in | Wt 234.0 lb

## 2022-11-11 DIAGNOSIS — Z95810 Presence of automatic (implantable) cardiac defibrillator: Secondary | ICD-10-CM | POA: Diagnosis not present

## 2022-11-11 DIAGNOSIS — I255 Ischemic cardiomyopathy: Secondary | ICD-10-CM | POA: Insufficient documentation

## 2022-11-11 DIAGNOSIS — I5022 Chronic systolic (congestive) heart failure: Secondary | ICD-10-CM | POA: Diagnosis not present

## 2022-11-11 DIAGNOSIS — I472 Ventricular tachycardia, unspecified: Secondary | ICD-10-CM | POA: Diagnosis not present

## 2022-11-11 LAB — COMPREHENSIVE METABOLIC PANEL
ALT: 19 IU/L (ref 0–44)
AST: 20 IU/L (ref 0–40)
Albumin/Globulin Ratio: 1.9 (ref 1.2–2.2)
Albumin: 4.1 g/dL (ref 3.7–4.7)
Alkaline Phosphatase: 87 IU/L (ref 44–121)
BUN/Creatinine Ratio: 16 (ref 10–24)
BUN: 22 mg/dL (ref 8–27)
Bilirubin Total: 0.4 mg/dL (ref 0.0–1.2)
CO2: 23 mmol/L (ref 20–29)
Calcium: 9.6 mg/dL (ref 8.6–10.2)
Chloride: 107 mmol/L — ABNORMAL HIGH (ref 96–106)
Creatinine, Ser: 1.41 mg/dL — ABNORMAL HIGH (ref 0.76–1.27)
Globulin, Total: 2.2 g/dL (ref 1.5–4.5)
Glucose: 94 mg/dL (ref 70–99)
Potassium: 4.9 mmol/L (ref 3.5–5.2)
Sodium: 143 mmol/L (ref 134–144)
Total Protein: 6.3 g/dL (ref 6.0–8.5)
eGFR: 50 mL/min/{1.73_m2} — ABNORMAL LOW (ref >59)

## 2022-11-11 LAB — T4, FREE: Free T4: 1.57 ng/dL (ref 0.82–1.77)

## 2022-11-11 LAB — TSH: TSH: 2.05 u[IU]/mL (ref 0.450–4.500)

## 2022-11-11 NOTE — Progress Notes (Signed)
  Electrophysiology Office Note:   Date:  11/11/2022  ID:  Earl Real Sr., DOB [birth year], MRN [id number]  Primary Cardiologist: Olga Millers, MD Electrophysiologist: Lanier Prude, MD   History of Present Illness:   Earl Larranaga Sr. is a 82 y.o. male with h/o  PAF, presumed CAD, Chronic systolic CHF, VT, ICD, HTN, HLD, and h/o AS  seen today for routine electrophysiology followup.   Seen 10/02/2022 with ICD shock. Noted to be off his amiodarone. Restarted at that visit.   Since last being seen in our clinic the patient reports doing very well.  he denies chest pain, palpitations, dyspnea, PND, orthopnea, nausea, vomiting, dizziness, syncope, edema, weight gain, or early satiety.   Review of systems complete and found to be negative unless listed in HPI.   Device History: Device History: STJ CRTD implanted 2014, gen change 10/22/20 for NICM and CHF History of appropriate therapy: Yes History of AAD therapy: No  Studies Reviewed:    ICD Interrogation-  reviewed in detail today,  See PACEART report.  EKG is not ordered today  Physical Exam:   VS:  BP (!) 104/58   Pulse 64   Ht 5\' 10"  (1.778 m)   Wt 234 lb (106.1 kg)   SpO2 90%   BMI 33.58 kg/m    Wt Readings from Last 3 Encounters:  11/11/22 234 lb (106.1 kg)  11/02/22 234 lb (106.1 kg)  10/02/22 235 lb (106.6 kg)     GEN: Well nourished, well developed in no acute distress NECK: No JVD; No carotid bruits CARDIAC: Regular rate and rhythm, no murmurs, rubs, gallops RESPIRATORY:  Clear to auscultation without rales, wheezing or rhonchi  ABDOMEN: Soft, non-tender, non-distended EXTREMITIES:  No edema; No deformity   ASSESSMENT AND PLAN:    Chronic systolic dysfunction s/p Abbott CRT-D  Echo 01/2022 LVEF 20-25%. Consider updating if has more VT or worsening symptoms, but for now would not change therapy.  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   Prior shocks in 8/23 and 10/23, and 09/2022 No further on brief check today.  Continue coreg 25 mg BID. If recurs, consider switching to max dose Toprol.  Continue amiodarone 200 mg daily. Stressed importance of compliance.  Restart amiodarone 200 mg BID x 2 weeks, then 200 mg daily.  Surveillance labs today.    Paroxysmal AF/AFL Continue eliquis 5 mg BID for CHA2DS2/VASc of at least 5.  Amiodarone as above.    Cardiomyopathy ? CAD Previous nuclear study showed infarct but no ischemia. He refused catheterization. If VT recurs on amiodarone, would recommend heart cath.    Disposition:   Follow up with EP APP or Dr. Lalla Brothers in 6 months   Signed, Graciella Freer, PA-C

## 2022-11-11 NOTE — Patient Instructions (Signed)
Medication Instructions:  Your physician recommends that you continue on your current medications as directed. Please refer to the Current Medication list given to you today.  *If you need a refill on your cardiac medications before your next appointment, please call your pharmacy*  Lab Work: CMET, TSH, FreeT4--TODAY If you have labs (blood work) drawn today and your tests are completely normal, you will receive your results only by: MyChart Message (if you have MyChart) OR A paper copy in the mail If you have any lab test that is abnormal or we need to change your treatment, we will call you to review the results.  Follow-Up: At [facility], you and your health needs are our priority.  As part of our continuing mission to provide you with exceptional heart care, we have created designated Provider Care Teams.  These Care Teams include your primary Cardiologist (physician) and Advanced Practice Providers (APPs -  Physician Assistants and Nurse Practitioners) who all work together to provide you with the care you need, when you need it.  We recommend signing up for the patient portal called "MyChart".  Sign up information is provided on this After Visit Summary.  MyChart is used to connect with patients for Virtual Visits (Telemedicine).  Patients are able to view lab/test results, encounter notes, upcoming appointments, etc.  Non-urgent messages can be sent to your provider as well.   To learn more about what you can do with MyChart, go to [email or web address].    Your next appointment:   6 month(s)  Provider:   Steffanie Dunn, MD or [facility]" [place], [place]

## 2022-11-13 ENCOUNTER — Other Ambulatory Visit: Payer: Self-pay | Admitting: Physician Assistant

## 2022-11-13 DIAGNOSIS — I420 Dilated cardiomyopathy: Secondary | ICD-10-CM

## 2022-11-13 DIAGNOSIS — I5023 Acute on chronic systolic (congestive) heart failure: Secondary | ICD-10-CM

## 2022-11-17 NOTE — Progress Notes (Signed)
Remote ICD transmission.   

## 2022-11-30 ENCOUNTER — Ambulatory Visit: Payer: Medicare Other | Attending: Cardiology

## 2022-11-30 DIAGNOSIS — Z95810 Presence of automatic (implantable) cardiac defibrillator: Secondary | ICD-10-CM

## 2022-11-30 DIAGNOSIS — I5022 Chronic systolic (congestive) heart failure: Secondary | ICD-10-CM

## 2022-12-03 ENCOUNTER — Telehealth: Payer: Self-pay

## 2022-12-03 NOTE — Telephone Encounter (Signed)
Remote ICM transmission received.  Attempted call to patient regarding ICM remote transmission and line busy. 

## 2022-12-03 NOTE — Progress Notes (Signed)
EPIC Encounter for ICM Monitoring  Patient Name: Earl Thien Sr. is a 82 y.o. male Date: 12/03/2022 Primary Care Physican: Nolene Ebbs Primary Cardiologist: Jens Som Electrophysiologist: Townsend Roger Pacing:  97% 03/26/2022 Weight: 228 lbs 06/10/2022 Office Weight: 238 10/02/2022 Office Weight: 235 lbs   AT/AF Burden <1%            Attempted call to patient and unable to reach.  Transmission reviewed.    CorVue thoracic impedance normal but was suggesting possible fluid accumulation from 5/25-5/31.   Prescribed:   Furosemide 40 mg 1 tablet (40 mg total) daily.    Spironolactone 25 mg take 0.5 tablet (12.5 mg total) daily Eliquis 5 mg take 1 tablet twice a day.   Labs: 11/11/2022 Creatinine 1.41, BUN 22, Potassium 4.9, Sodium 143, GFR 50 10/02/2022 Creatinine 1.16, BUN 17, Potassium 4.7, Sodium 143, GFR 63 10/01/2022 Creatinine 1.22, BUN 18, potassium 4.4, Sodium 144, GFR 60 A complete set of results can be found in Results Review.   Recommendations:  Unable to reach.     Follow-up plan: ICM clinic phone appointment on 01/04/2023.   91 day device clinic remote transmission 01/22/2023.     EP/Cardiology Office Visits:  Recall 11/04/2022 with Dr Jens Som (6 month f/u).  05/10/2023 with Dr Lalla Brothers.       Copy of ICM check sent to Dr. Lalla Brothers.   3 month ICM trend: 11/30/2022.    12-14 Month ICM trend:     Karie Soda, RN 12/03/2022 10:53 AM

## 2022-12-07 ENCOUNTER — Other Ambulatory Visit: Payer: Self-pay | Admitting: Cardiology

## 2022-12-07 DIAGNOSIS — I480 Paroxysmal atrial fibrillation: Secondary | ICD-10-CM

## 2022-12-07 NOTE — Telephone Encounter (Signed)
Prescription refill request for Eliquis received. Indication: PAF Last office visit: 11/11/22  Polly Cobia PA-C Scr: 1.41 on 11/11/22  Epic Age: 82 Weight: 106.1kg  Based on above findings Eliquis 5mg  twice daily is the appropriate dose.  Refill approved.

## 2022-12-30 ENCOUNTER — Other Ambulatory Visit: Payer: Self-pay | Admitting: Family Medicine

## 2022-12-30 DIAGNOSIS — I10 Essential (primary) hypertension: Secondary | ICD-10-CM

## 2022-12-30 DIAGNOSIS — I5023 Acute on chronic systolic (congestive) heart failure: Secondary | ICD-10-CM

## 2022-12-30 DIAGNOSIS — I420 Dilated cardiomyopathy: Secondary | ICD-10-CM

## 2023-01-04 ENCOUNTER — Ambulatory Visit: Payer: Medicare Other

## 2023-01-04 DIAGNOSIS — Z95810 Presence of automatic (implantable) cardiac defibrillator: Secondary | ICD-10-CM | POA: Diagnosis not present

## 2023-01-04 DIAGNOSIS — I5022 Chronic systolic (congestive) heart failure: Secondary | ICD-10-CM | POA: Diagnosis not present

## 2023-01-07 ENCOUNTER — Other Ambulatory Visit: Payer: Self-pay | Admitting: Physician Assistant

## 2023-01-07 DIAGNOSIS — E1122 Type 2 diabetes mellitus with diabetic chronic kidney disease: Secondary | ICD-10-CM

## 2023-01-08 NOTE — Progress Notes (Signed)
EPIC Encounter for ICM Monitoring  Patient Name: Earl Chivers Sr. is a 82 y.o. male Date: 01/08/2023 Primary Care Physican: Nolene Ebbs Primary Cardiologist: Jens Som Electrophysiologist: Townsend Roger Pacing:  97% 03/26/2022 Weight: 228 lbs 06/10/2022 Office Weight: 238 10/02/2022 Office Weight: 235 lbs   AT/AF Burden <1%            Transmission reviewed.    CorVue thoracic impedance normal but was suggesting possible fluid accumulation from 5/25-5/31.   Prescribed:   Furosemide 40 mg 1 tablet (40 mg total) daily.    Spironolactone 25 mg take 0.5 tablet (12.5 mg total) daily Eliquis 5 mg take 1 tablet twice a day.   Labs: 11/11/2022 Creatinine 1.41, BUN 22, Potassium 4.9, Sodium 143, GFR 50 10/02/2022 Creatinine 1.16, BUN 17, Potassium 4.7, Sodium 143, GFR 63 10/01/2022 Creatinine 1.22, BUN 18, potassium 4.4, Sodium 144, GFR 60 A complete set of results can be found in Results Review.   Recommendations:  No changes.    Follow-up plan: ICM clinic phone appointment on 02/08/2023.   91 day device clinic remote transmission 01/22/2023.     EP/Cardiology Office Visits:  Recall 11/04/2022 with Dr Jens Som (6 month f/u).  05/10/2023 with Dr Lalla Brothers.       Copy of ICM check sent to Dr. Lalla Brothers.   3 month ICM trend: 01/04/2023.    12-14 Month ICM trend:     Earl Soda, RN 01/08/2023 3:29 PM

## 2023-01-22 ENCOUNTER — Ambulatory Visit (INDEPENDENT_AMBULATORY_CARE_PROVIDER_SITE_OTHER): Payer: Medicare Other

## 2023-01-22 DIAGNOSIS — I255 Ischemic cardiomyopathy: Secondary | ICD-10-CM | POA: Diagnosis not present

## 2023-01-22 LAB — CUP PACEART REMOTE DEVICE CHECK
Battery Remaining Longevity: 64 mo
Battery Remaining Percentage: 68 %
Battery Voltage: 2.98 V
Brady Statistic AP VP Percent: 97 %
Brady Statistic AP VS Percent: 2.9 %
Brady Statistic AS VP Percent: 1 %
Brady Statistic AS VS Percent: 1 %
Brady Statistic RA Percent Paced: 99 %
Date Time Interrogation Session: 20240726020056
HighPow Impedance: 43 Ohm
Implantable Lead Connection Status: 753985
Implantable Lead Connection Status: 753985
Implantable Lead Connection Status: 753985
Implantable Lead Implant Date: 20140117
Implantable Lead Implant Date: 20140117
Implantable Lead Implant Date: 20140117
Implantable Lead Location: 753858
Implantable Lead Location: 753859
Implantable Lead Location: 753860
Implantable Pulse Generator Implant Date: 20220426
Lead Channel Impedance Value: 390 Ohm
Lead Channel Impedance Value: 410 Ohm
Lead Channel Impedance Value: 830 Ohm
Lead Channel Pacing Threshold Amplitude: 0.75 V
Lead Channel Pacing Threshold Amplitude: 1 V
Lead Channel Pacing Threshold Amplitude: 1.125 V
Lead Channel Pacing Threshold Pulse Width: 0.5 ms
Lead Channel Pacing Threshold Pulse Width: 0.5 ms
Lead Channel Pacing Threshold Pulse Width: 0.5 ms
Lead Channel Sensing Intrinsic Amplitude: 12 mV
Lead Channel Sensing Intrinsic Amplitude: 5 mV
Lead Channel Setting Pacing Amplitude: 1.5 V
Lead Channel Setting Pacing Amplitude: 1.75 V
Lead Channel Setting Pacing Amplitude: 2.125
Lead Channel Setting Pacing Pulse Width: 0.5 ms
Lead Channel Setting Pacing Pulse Width: 0.5 ms
Lead Channel Setting Sensing Sensitivity: 0.5 mV
Pulse Gen Serial Number: 810027402

## 2023-01-27 ENCOUNTER — Other Ambulatory Visit: Payer: Self-pay

## 2023-01-27 DIAGNOSIS — I420 Dilated cardiomyopathy: Secondary | ICD-10-CM

## 2023-01-27 MED ORDER — ENTRESTO 97-103 MG PO TABS: 1.0000 | ORAL_TABLET | Freq: Two times a day (BID) | ORAL | 2 refills | Status: DC

## 2023-01-29 NOTE — Progress Notes (Signed)
Remote ICD transmission.   

## 2023-01-31 ENCOUNTER — Other Ambulatory Visit: Payer: Self-pay | Admitting: Physician Assistant

## 2023-01-31 DIAGNOSIS — E782 Mixed hyperlipidemia: Secondary | ICD-10-CM

## 2023-02-02 DIAGNOSIS — Z23 Encounter for immunization: Secondary | ICD-10-CM | POA: Diagnosis not present

## 2023-02-08 ENCOUNTER — Ambulatory Visit: Payer: Medicare Other | Attending: Cardiology

## 2023-02-08 DIAGNOSIS — Z95810 Presence of automatic (implantable) cardiac defibrillator: Secondary | ICD-10-CM | POA: Diagnosis not present

## 2023-02-08 DIAGNOSIS — I5022 Chronic systolic (congestive) heart failure: Secondary | ICD-10-CM | POA: Diagnosis not present

## 2023-02-09 ENCOUNTER — Ambulatory Visit (INDEPENDENT_AMBULATORY_CARE_PROVIDER_SITE_OTHER): Payer: Medicare Other | Admitting: Physician Assistant

## 2023-02-09 ENCOUNTER — Encounter: Payer: Self-pay | Admitting: Physician Assistant

## 2023-02-09 VITALS — BP 103/42 | HR 64 | Ht 70.0 in | Wt 229.0 lb

## 2023-02-09 DIAGNOSIS — I10 Essential (primary) hypertension: Secondary | ICD-10-CM

## 2023-02-09 DIAGNOSIS — I420 Dilated cardiomyopathy: Secondary | ICD-10-CM

## 2023-02-09 DIAGNOSIS — E1122 Type 2 diabetes mellitus with diabetic chronic kidney disease: Secondary | ICD-10-CM

## 2023-02-09 DIAGNOSIS — Z7984 Long term (current) use of oral hypoglycemic drugs: Secondary | ICD-10-CM

## 2023-02-09 DIAGNOSIS — E559 Vitamin D deficiency, unspecified: Secondary | ICD-10-CM

## 2023-02-09 DIAGNOSIS — I4901 Ventricular fibrillation: Secondary | ICD-10-CM

## 2023-02-09 DIAGNOSIS — I5023 Acute on chronic systolic (congestive) heart failure: Secondary | ICD-10-CM | POA: Diagnosis not present

## 2023-02-09 DIAGNOSIS — I288 Other diseases of pulmonary vessels: Secondary | ICD-10-CM

## 2023-02-09 LAB — POCT GLYCOSYLATED HEMOGLOBIN (HGB A1C): Hemoglobin A1C: 6.8 % — AB (ref 4.0–5.6)

## 2023-02-09 NOTE — Patient Instructions (Signed)
Cut lasix in half and see if BP up some but swelling controlled.

## 2023-02-09 NOTE — Progress Notes (Signed)
Established Patient Office Visit  Subjective   Patient ID: Earl Smyser Sr., male    DOB: [birth year]  Age: 82 y.o. MRN: [id number]  Chief Complaint  Patient presents with   Medical Management of Chronic Issues    D/m fup last A1c was 6.6    HPI Pt is a 82 yo obese male with T2DM, HTN, CHF, HLD who presents to the clinic for follow up and medication refills.   He is followed by cardiology and has ICD placed. He is down 6lbs from last visit and denies any swelling. She is intermittently SOB with good and bad days. He is not checking his sugars. He is compliant with medications. He does try to keep low sodium diet. He wishes he had more energy.   .. Active Ambulatory Problems    Diagnosis Date Noted   Essential hypertension, benign 04/28/2013   BPH (benign prostatic hyperplasia) 04/28/2013   Gout 04/28/2013   Congestive dilated cardiomyopathy (HCC) 05/10/2013   ICD (implantable cardioverter-defibrillator) in place 05/10/2013   Elevated PSA 11/24/2013   Systolic dysfunction with acute on chronic heart failure (HCC) 09/03/2014   Morbid obesity (HCC) 09/03/2014   Erectile dysfunction 03/10/2015   Lower extremity edema 09/29/2017   Abnormal ultrasound of lower extremity 09/29/2017   Malignant neoplasm of prostate (HCC) 02/03/2018   Family history of cancer 02/03/2018   Family history of prostate cancer    Family history of breast cancer    Genetic testing 03/25/2018   Hypotension 05/23/2018   Paroxysmal atrial fibrillation (HCC) 12/25/2015   History of gout 08/01/2018   Type 2 diabetes mellitus with chronic kidney disease, 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HCC) 08/01/2018   Acute left-sided low back pain without sciatica 05/16/2019   Syncope and collapse 05/24/2019   Chronic systolic heart failure (HCC) 06/08/2019   Ventricular fibrillation (HCC) 08/16/2019   Fall 08/16/2019   Impetigo 08/18/2019   Rectal bleeding 12/11/2019   Mixed hyperlipidemia 12/11/2019    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   Vitamin D deficiency 11/03/2021   SOB (shortness of breath) 11/04/2021   Localized swelling of both lower extremities 11/04/2021   Restrictive pattern present on pulmonary function testing 11/18/2021   Arthritis of left wrist 02/04/2022   Left arm swelling 02/06/2022   Left wrist pain 02/06/2022   Arthritis of multiple sites 11/04/2022   Resolved Ambulatory Problems    Diagnosis Date Noted   Diabetes mellitus due to underlying condition, controlled, without complication, 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HCC) 04/28/2013   Past Medical History:  Diagnosis Date   CHF (congestive heart failure) (HCC)    Diabetes mellitus (HCC)    Hyperlipidemia    Hypertension    Nonischemic cardiomyopathy (HCC)    Prostate cancer (HCC)      ROS See HPI.    Objective:     BP (!) 103/42   Pulse 64   Ht 5\' 10"  (1.778 m)   Wt 229 lb (103.9 kg)   SpO2 99%   BMI 32.86 kg/m  BP Readings from Last 3 Encounters:  02/09/23 (!) 103/42  11/11/22 (!) 104/58  11/02/22 (!) 96/51   Wt Readings from Last 3 Encounters:  02/09/23 229 lb (103.9 kg)  11/11/22 234 lb (106.1 kg)  11/02/22 234 lb (106.1 kg)    .[name] Lab Results  Component Value Date   HGBA1C 6.8 (A) 02/09/2023    Physical Exam Constitutional:      Appearance: Normal appearance. He is obese.  Cardiovascular:     Rate and Rhythm: Normal rate and regular rhythm.     Heart sounds: Murmur heard.  Pulmonary:     Effort: Pulmonary effort is normal.     Breath sounds: Normal breath sounds.  Musculoskeletal:     Cervical back: Normal range of motion and neck supple.     Right lower leg: No edema.     Left lower leg: No edema.  Neurological:     Mental Status: He is alert.  Psychiatric:        Mood and Affect: Mood normal.         Assessment & Plan:  [name] KitchenMarland KitchenSherril was seen today for medical management of chronic issues.  Diagnoses and all orders for this visit:  Type 2  diabetes mellitus with chronic kidney disease, without long-term current use of insulin, unspecified CKD stage (HCC) -     POCT HgB A1C -     CMP14+EGFR -     VITAMIN D 25 Hydroxy (Vit-D Deficiency, Fractures) -     B12 and Folate Panel -     Brain natriuretic peptide -     Magnesium  Systolic dysfunction with acute on chronic heart failure (HCC) -     CMP14+EGFR -     VITAMIN D 25 Hydroxy (Vit-D Deficiency, Fractures) -     B12 and Folate Panel -     Brain natriuretic peptide -     Magnesium  Congestive dilated cardiomyopathy (HCC) -     CMP14+EGFR -     VITAMIN D 25 Hydroxy (Vit-D Deficiency, Fractures) -     B12 and Folate Panel -     Brain natriuretic peptide -     Magnesium  Essential hypertension, benign -     CMP14+EGFR -     VITAMIN D 25 Hydroxy (Vit-D Deficiency, Fractures) -     B12 and Folate Panel -     Brain natriuretic peptide -     Magnesium  Vitamin D deficiency -     VITAMIN D 25 Hydroxy (Vit-D Deficiency, Fractures)  Other orders -     budesonide-formoterol (SYMBICORT) 160-4.5 MCG/ACT inhaler; Inhale 2 puffs into the lungs in the morning and at bedtime.   BP a little low today and does complain of fatigue and weakness from time to time No evidence of fluid overload Ok to take 1/2 tablet of lasix daily if no swelling or worsening SOB and BP stays under 130/80 If needed can take full tablet  A1C under 7 but up a little Watch sugars and carbs Stay on trulicity and synjardy BP to goal On statin Reminder to get eye exam Discussed covid booster and flu shot, declined today.  Labs to look at any causes of fatigue that we might could help.     Return in about 3 months (around 05/12/2023).    [name], PA-C

## 2023-02-10 ENCOUNTER — Encounter: Payer: Self-pay | Admitting: Physician Assistant

## 2023-02-10 MED ORDER — BUDESONIDE-FORMOTEROL FUMARATE 160-4.5 MCG/ACT IN AERO
2.0000 | INHALATION_SPRAY | Freq: Two times a day (BID) | RESPIRATORY_TRACT | 12 refills | Status: AC
Start: 2023-02-10 — End: ?

## 2023-02-10 NOTE — Progress Notes (Signed)
Earl White,   Magnesium is just a little elevated. How much are you taking nightly. If you can cut back just a hair could get you in normal range.  Vitamin D looks great.  B12 is normal range.   You do have some acute kidney injury. Keep to that 1/2 tablet of lasix and make sure drinking enough fluids. How much are you drinking? Your kidneys look too dry right now. Recheck in 1 week.

## 2023-02-12 NOTE — Progress Notes (Signed)
EPIC Encounter for ICM Monitoring  Patient Name: Earl Clendenning Sr. is a 82 y.o. male Date: 02/12/2023 Primary Care Physican: Nolene Ebbs Primary Cardiologist: Jens Som Electrophysiologist: Townsend Roger Pacing:  97% 03/26/2022 Weight: 228 lbs 06/10/2022 Office Weight: 238 10/02/2022 Office Weight: 235 lbs   AT/AF Burden <1%            Transmission reviewed.    CorVue thoracic impedance suggesting normal fluid levels with the exception of possible fluid accumulation from 8/3-8/9.   Prescribed:   Furosemide 40 mg 1 tablet (40 mg total) daily.    Spironolactone 25 mg take 0.5 tablet (12.5 mg total) daily Eliquis 5 mg take 1 tablet twice a day.   Labs: 02/09/2023 Creatinine 2.14, BUN 42, Potassium 4.7, Sodium 141, GFR 30 11/11/2022 Creatinine 1.41, BUN 22, Potassium 4.9, Sodium 143, GFR 50 10/02/2022 Creatinine 1.16, BUN 17, Potassium 4.7, Sodium 143, GFR 63 10/01/2022 Creatinine 1.22, BUN 18, potassium 4.4, Sodium 144, GFR 60 A complete set of results can be found in Results Review.   Recommendations:  No changes.    Follow-up plan: ICM clinic phone appointment on 03/15/2023.   91 day device clinic remote transmission 04/23/2023.     EP/Cardiology Office Visits:  Recall 11/04/2022 with Dr Jens Som (6 month f/u).  05/10/2023 with Dr Lalla Brothers.       Copy of ICM check sent to Dr. Lalla Brothers.   3 month ICM trend: 02/08/2023.    12-14 Month ICM trend:     Karie Soda, RN 02/12/2023 3:06 PM

## 2023-02-26 ENCOUNTER — Other Ambulatory Visit: Payer: Self-pay | Admitting: Physician Assistant

## 2023-02-26 DIAGNOSIS — I5023 Acute on chronic systolic (congestive) heart failure: Secondary | ICD-10-CM

## 2023-02-26 DIAGNOSIS — I420 Dilated cardiomyopathy: Secondary | ICD-10-CM

## 2023-02-27 ENCOUNTER — Other Ambulatory Visit: Payer: Self-pay | Admitting: Physician Assistant

## 2023-02-27 DIAGNOSIS — I10 Essential (primary) hypertension: Secondary | ICD-10-CM

## 2023-02-27 DIAGNOSIS — I420 Dilated cardiomyopathy: Secondary | ICD-10-CM

## 2023-02-27 DIAGNOSIS — I5023 Acute on chronic systolic (congestive) heart failure: Secondary | ICD-10-CM

## 2023-03-15 ENCOUNTER — Ambulatory Visit: Payer: Medicare Other | Attending: Cardiology

## 2023-03-15 DIAGNOSIS — Z95810 Presence of automatic (implantable) cardiac defibrillator: Secondary | ICD-10-CM | POA: Diagnosis not present

## 2023-03-15 DIAGNOSIS — I5022 Chronic systolic (congestive) heart failure: Secondary | ICD-10-CM | POA: Diagnosis not present

## 2023-03-17 NOTE — Progress Notes (Signed)
EPIC Encounter for ICM Monitoring  Patient Name: Earl Mandujano Sr. is a 82 y.o. male Date: 03/17/2023 Primary Care Physican: Nolene Ebbs Primary Cardiologist: Jens Som Electrophysiologist: Townsend Roger Pacing:  96% 03/26/2022 Weight: 228 lbs 06/10/2022 Office Weight: 238 10/02/2022 Office Weight: 235 lbs   AT/AF Burden <1%            Transmission reviewed.    CorVue thoracic impedance suggesting possible fluid accumulation from 9/7-9/13 within the last month.   Prescribed:   Furosemide 40 mg 1 tablet (40 mg total) daily.    Spironolactone 25 mg take 0.5 tablet (12.5 mg total) daily Eliquis 5 mg take 1 tablet twice a day.   Labs: 02/09/2023 Creatinine 2.14, BUN 42, Potassium 4.7, Sodium 141, GFR 30 11/11/2022 Creatinine 1.41, BUN 22, Potassium 4.9, Sodium 143, GFR 50 10/02/2022 Creatinine 1.16, BUN 17, Potassium 4.7, Sodium 143, GFR 63 10/01/2022 Creatinine 1.22, BUN 18, potassium 4.4, Sodium 144, GFR 60 A complete set of results can be found in Results Review.   Recommendations:  No changes.    Follow-up plan: ICM clinic phone appointment on 04/19/2023.   91 day device clinic remote transmission 04/23/2023.     EP/Cardiology Office Visits:  Recall 11/04/2022 with Dr Jens Som (6 month f/u).  05/10/2023 with Dr Lalla Brothers.       Copy of ICM check sent to Dr. Lalla Brothers.    3 month ICM trend: 03/15/2023.    12-14 Month ICM trend:     Earl Soda, RN 03/17/2023 12:58 PM

## 2023-04-05 ENCOUNTER — Ambulatory Visit: Payer: Medicare Other | Admitting: Physician Assistant

## 2023-04-05 DIAGNOSIS — Z0000 Encounter for general adult medical examination without abnormal findings: Secondary | ICD-10-CM | POA: Diagnosis not present

## 2023-04-05 NOTE — Progress Notes (Signed)
MEDICARE ANNUAL WELLNESS VISIT  04/05/2023  Telephone Visit Disclaimer This Medicare AWV was conducted by telephone due to national recommendations for restrictions regarding the COVID-19 Pandemic (e.g. social distancing).  I verified, using two identifiers, that I am speaking with Earl Real Sr. or their authorized healthcare agent. I discussed the limitations, risks, security, and privacy concerns of performing an evaluation and management service by telephone and the potential availability of an in-person appointment in the future. The patient expressed understanding and agreed to proceed.  Location of Patient: Home Location of Provider (nurse):  In the office.  Subjective:    Earl Viets Sr. is a 82 y.o. male patient of Caleen Essex, Lonna Cobb, Earl White who had a Medicare Annual Wellness Visit today via telephone. Earl White is Retired and lives alone. he has one child. he reports that he is socially active and does interact with friends/family regularly. he is moderately physically active and enjoys motorcycle riding, art, sports and traveling.  Patient Care Team: Nolene Ebbs as PCP - General (Family Medicine) Jens Som Madolyn Frieze, MD as PCP - Cardiology (Cardiology) Lanier Prude, MD as PCP - Electrophysiology (Cardiology) Marily Lente, NP (Inactive) as Nurse Practitioner (Cardiology)     04/05/2023    8:57 AM 03/30/2022    8:34 AM 03/24/2021    9:08 AM 10/22/2020   12:13 PM 05/23/2019    2:00 PM 05/17/2018    3:09 PM  Advanced Directives  Does Patient Have a Medical Advance Directive? Yes Yes Yes Yes No Yes  Type of Advance Directive Living will Living will Living will;Healthcare Power of Attorney Living will  Living will  Does patient want to make changes to medical advance directive? No - Patient declined No - Patient declined No - Patient declined Yes (MAU/Ambulatory/Procedural Areas - Information given)  No - Patient declined  Copy of Healthcare Power of Attorney  in Chart?   No - copy requested     Would patient like information on creating a medical advance directive?     Yes (MAU/Ambulatory/Procedural Areas - Information given)     Hospital Utilization Over the Past 12 Months: # of hospitalizations or ER visits: 0 # of surgeries: 0  Review of Systems    Patient reports that his overall health is better compared to last year.  History obtained from chart review and the patient  Patient Reported Readings (BP, Pulse, CBG, Weight, etc) none Per patient no change in vitals since last visit, unable to obtain new vitals due to telehealth visit  Pain Assessment Pain : No/denies pain     Current Medications & Allergies (verified) Allergies as of 04/05/2023   No Known Allergies      Medication List        Accurate as of April 05, 2023  9:20 AM. If you have any questions, ask your nurse or doctor.          albuterol 108 (90 Base) MCG/ACT inhaler Commonly known as: VENTOLIN HFA Inhale 2 puffs into the lungs every 6 (six) hours as needed for wheezing or shortness of breath.   amiodarone 200 MG tablet Commonly known as: PACERONE Take 1 tablet (200 mg total) by mouth daily.   atorvastatin 80 MG tablet Commonly known as: LIPITOR Take 1 tablet by mouth once daily   budesonide-formoterol 160-4.5 MCG/ACT inhaler Commonly known as: Symbicort Inhale 2 puffs into the lungs in the morning and at bedtime.   carvedilol 25 MG tablet Commonly known as: COREG TAKE 1  TABLET BY MOUTH TWICE DAILY WITH A MEAL . APPOINTMENT REQUIRED FOR FUTURE REFILLS   diclofenac Sodium 1 % Gel Commonly known as: VOLTAREN Apply 4 g topically 4 (four) times daily. To affected joint.   Eliquis 5 MG Tabs tablet Generic drug: apixaban Take 1 tablet by mouth twice daily   Entresto 97-103 MG Generic drug: sacubitril-valsartan Take 1 tablet by mouth 2 (two) times daily.   furosemide 40 MG tablet Commonly known as: LASIX Take 1 tablet by mouth once daily    Magnesium Oxide -Mg Supplement 400 MG Caps Take 400 mg by mouth daily.   sennosides-docusate sodium 8.6-50 MG tablet Commonly known as: SENOKOT-S Take 1 tablet by mouth daily as needed. For constipation   spironolactone 25 MG tablet Commonly known as: ALDACTONE Take 1 tablet by mouth once daily   Synjardy XR 03-999 MG Tb24 Generic drug: Empagliflozin-metFORMIN HCl ER Take 1 tablet by mouth once daily   Trulicity 1.5 MG/0.5ML Sopn Generic drug: Dulaglutide INJECT 1.5 MG (0.5ML)  SUBCUTANEOUSLY ONCE A WEEK   Vitamin D (Ergocalciferol) 1.25 MG (50000 UNIT) Caps capsule Commonly known as: DRISDOL Take 1 capsule (50,000 Units total) by mouth every 7 (seven) days.        History (reviewed): Past Medical History:  Diagnosis Date   BPH (benign prostatic hyperplasia)    CHF (congestive heart failure) (HCC)    Diabetes mellitus (HCC)    Type II. Diet controlled   Family history of breast cancer    Family history of prostate cancer    Gout    Hyperlipidemia    Hypertension    Nonischemic cardiomyopathy (HCC)    a. s/p STJ CRTD   Paroxysmal atrial fibrillation (HCC) 12/25/15   Prostate cancer [facility])    Past Surgical History:  Procedure Laterality Date   BIV ICD GENERATOR CHANGEOUT N/A 10/22/2020   Procedure: BIV ICD GENERATOR CHANGEOUT;  Surgeon: Hillis Range, MD;  Location: [facility] INVASIVE CV LAB;  Service: Cardiovascular;  Laterality: N/A;   CARDIAC DEFIBRILLATOR PLACEMENT  Jan 2014   SJM Quadra Assura implanted by Dr Randel Books in [place]   Family History  Problem Relation Age of Onset   Diabetes Mother    Hyperlipidemia Mother    Emphysema Father        smoked pipe   Breast cancer Sister 36   Heart attack Brother    Diabetes Brother    Throat cancer Brother 77       [place], ? due to agent orange exposure   Prostate cancer Maternal Uncle    Prostate cancer Cousin        [place] vet, agent orange exposure   Prostate cancer Cousin        dx 28-30 yrs.  - mat first cousin's son   Prostate cancer Cousin    Prostate cancer Cousin    Social History   Socioeconomic History   Marital status: Widowed    Spouse name: Not on file   Number of children: 1   Years of education: Bachelor's degree`   Highest education level: Bachelor's degree (e.g., BA, AB, BS)  Occupational History   Occupation: retired    Comment: [employer]  Tobacco Use   Smoking status: Never    Passive exposure: Past   Smokeless tobacco: Never  Vaping Use   Vaping status: Never Used  Substance and Sexual Activity   Alcohol use: Not Currently    Comment: Occasional   Drug use: No  Sexual activity: Yes  Other Topics Concern   Not on file  Social History Narrative   Lives alone. He has one child and two grandkids. They live close by and visit often. He enjoys motorcycle riding, art, sports and traveling.   Social Determinants of Health   Financial Resource Strain: Low Risk  (04/05/2023)   Overall Financial Resource Strain (CARDIA)    Difficulty of Paying Living Expenses: Not hard at all  Food Insecurity: No Food Insecurity (04/05/2023)   Hunger Vital Sign    Worried About Running Out of Food in the Last Year: Never true    Ran Out of Food in the Last Year: Never true  Transportation Needs: No Transportation Needs (04/05/2023)   PRAPARE - Administrator, Civil Service (Medical): No    Lack of Transportation (Non-Medical): No  Physical Activity: Sufficiently Active (04/05/2023)   Exercise Vital Sign    Days of Exercise per Week: 4 days    Minutes of Exercise per Session: 50 min  Stress: No Stress Concern Present (04/05/2023)   [employer] of Occupational Health - Occupational Stress Questionnaire    Feeling of Stress : Not at all  Social Connections: Moderately Integrated (04/05/2023)   Social Connection and Isolation Panel [NHANES]    Frequency of Communication with Friends and Family: More than three times a week    Frequency of Social  Gatherings with Friends and Family: Three times a week    Attends Religious Services: More than 4 times per year    Active Member of Clubs or Organizations: Yes    Attends Banker Meetings: More than 4 times per year    Marital Status: Widowed    Activities of Daily Living    04/05/2023    9:06 AM  In your present state of health, do you have any difficulty performing the following activities:  Hearing? 0  Vision? 0  Difficulty concentrating or making decisions? 0  Walking or climbing stairs? 0  Dressing or bathing? 0  Doing errands, shopping? 0  Preparing Food and eating ? N  Using the Toilet? N  In the past six months, have you accidently leaked urine? N  Do you have problems with loss of bowel control? N  Managing your Medications? N  Managing your Finances? N  Housekeeping or managing your Housekeeping? N    Patient Education/ Literacy How often do you need to have someone help you when you read instructions, pamphlets, or other written materials from your doctor or pharmacy?: 1 - Never What is the last grade level you completed in school?: Bachelor's degree  Exercise    Diet Patient reports consuming 2 meals a day and 1 snack(s) a day Patient reports that his primary diet is: Regular Patient reports that she does have regular access to food.   Depression Screen    04/05/2023    8:58 AM 02/09/2023    1:42 PM 05/06/2022    2:12 PM 03/30/2022    8:34 AM 03/24/2021    9:09 AM 12/11/2019    2:09 PM 05/16/2019    2:52 PM  PHQ 2/9 Scores  PHQ - 2 Score 0 0 0 0 0 0 1  PHQ- 9 Score      2 3     Fall Risk    04/05/2023    8:58 AM 02/09/2023    1:42 PM 05/06/2022    2:12 PM 03/30/2022    8:34 AM 03/24/2021    9:09 AM  Fall Risk   Falls in the past year? 0 0 0 0 0  Number falls in past yr: 0 0 0 0 0  Injury with Fall? 0 0 0 0 0  Risk for fall due to : No Fall Risks  No Fall Risks No Fall Risks No Fall Risks  Follow up Falls evaluation completed  Falls  evaluation completed Falls evaluation completed Falls evaluation completed     Objective:  Earl Real Sr. seemed alert and oriented and he participated appropriately during our telephone visit.  Blood Pressure Weight BMI  BP Readings from Last 3 Encounters:  02/09/23 (!) 103/42  11/11/22 (!) 104/58  11/02/22 (!) 96/51   Wt Readings from Last 3 Encounters:  02/09/23 229 lb (103.9 kg)  11/11/22 234 lb (106.1 kg)  11/02/22 234 lb (106.1 kg)   BMI Readings from Last 1 Encounters:  02/09/23 32.86 kg/m    *Unable to obtain current vital signs, weight, and BMI due to telephone visit type  Hearing/Vision  Earl White did not seem to have difficulty with hearing/understanding during the telephone conversation Reports that he has not had a formal eye exam by an eye care professional within the past year Reports that he has not had a formal hearing evaluation within the past year *Unable to fully assess hearing and vision during telephone visit type  Cognitive Function:    04/05/2023    9:08 AM 03/30/2022    8:39 AM 03/24/2021    9:13 AM 05/23/2019    2:41 PM 05/17/2018    3:15 PM  6CIT Screen  What Year? 0 points 0 points 0 points 0 points 0 points  What month? 0 points 0 points 0 points 0 points 0 points  What time? 0 points 0 points 0 points 0 points 0 points  Count back from 20 0 points 0 points 0 points  0 points  Months in reverse 0 points 0 points 0 points  0 points  Repeat phrase 2 points 2 points 2 points  2 points  Total Score 2 points 2 points 2 points  2 points   (Normal:0-7, Significant for Dysfunction: >8)  Normal Cognitive Function Screening: Yes   Immunization & Health Maintenance Record Immunization History  Administered Date(s) Administered   Fluad Quad(high Dose 65+) 05/16/2019, 04/26/2020, 04/24/2021, 05/06/2022   Influenza, High Dose Seasonal PF 07/25/2021, 02/02/2023   Moderna SARS-COV2 Booster Vaccination 01/20/2021   Moderna Sars-Covid-2 Vaccination  08/26/2019, 09/23/2019, 04/25/2020   PFIZER(Purple Top)SARS-COV-2 Vaccination 02/06/2023   Pfizer(Comirnaty)Fall Seasonal Vaccine 12 years and older 05/06/2022   Pneumococcal Conjugate-13 08/07/2015   Pneumococcal Polysaccharide-23 08/26/2018   Tdap 11/02/2014   Zoster Recombinant(Shingrix) 08/28/2022, 10/28/2022    Health Maintenance  Topic Date Due   OPHTHALMOLOGY EXAM  04/05/2023 (Originally 06/12/2021)   Diabetic kidney evaluation - Urine ACR  04/06/2023 (Originally 08/15/2020)   COVID-19 Vaccine (6 - 2023-24 season) 04/21/2023 (Originally 04/03/2023)   HEMOGLOBIN A1C  08/12/2023   Diabetic kidney evaluation - eGFR measurement  02/09/2024   FOOT EXAM  02/09/2024   Medicare Annual Wellness (AWV)  04/04/2024   DTaP/Tdap/Td (2 - Td or Tdap) 11/01/2024   Pneumonia Vaccine 70+ Years old  Completed   INFLUENZA VACCINE  Completed   Zoster Vaccines- Shingrix  Completed   HPV VACCINES  Aged Out   Hepatitis C Screening  Discontinued       Assessment  This is a routine wellness examination for Earl WhiteMarland Kitchen  Health Maintenance: Due or Overdue There are no preventive  care reminders to display for this patient.   Earl Real Sr. does not need a referral for Community Assistance: Care Management:   no Social Work:    no Prescription Assistance:  no Nutrition/Diabetes Education:  no   Plan:  Personalized Goals  Goals Addressed               This Visit's Progress     Patient Stated (pt-stated)        Patient stated that he has started exercising more and he would like to maintain that to be able to get his energy back.       Personalized Health Maintenance & Screening Recommendations  Diabetic eye exam  Lung Cancer Screening Recommended: no (Low Dose CT Chest recommended if Age 57-80 years, 20 pack-year currently smoking OR have quit w/in past 15 years) Hepatitis C Screening recommended: no HIV Screening recommended: no  Advanced Directives: Written  information was not prepared per patient's request.  Referrals & Orders No orders of the defined types were placed in this encounter.   Follow-up Plan Follow-up with Earl Longs, Earl White as planned Diabetic eye exam scheduled for 06/03/23 at 1 pm.  Medicare wellness visit in one year.  AVS printed and mailed to the patient.    I have personally reviewed and noted the following in the patient's chart:   Medical and social history Use of alcohol, tobacco or illicit drugs  Current medications and supplements Functional ability and status Nutritional status Physical activity Advanced directives List of other physicians Hospitalizations, surgeries, and ER visits in previous 12 months Vitals Screenings to include cognitive, depression, and falls Referrals and appointments  In addition, I have reviewed and discussed with Earl Real Sr. certain preventive protocols, quality metrics, and best practice recommendations. A written personalized care plan for preventive services as well as general preventive health recommendations is available and can be mailed to the patient at his request.      Modesto Charon, RN BSN  04/05/2023

## 2023-04-05 NOTE — Patient Instructions (Addendum)
MEDICARE ANNUAL WELLNESS VISIT Health Maintenance Summary and Written Plan of Care  Earl White ,  Thank you for allowing me to perform your Medicare Annual Wellness Visit and for your ongoing commitment to your health.   Health Maintenance & Immunization History Health Maintenance  Topic Date Due   OPHTHALMOLOGY EXAM  04/05/2023 (Originally 06/12/2021)   Diabetic kidney evaluation - Urine ACR  04/06/2023 (Originally 08/15/2020)   COVID-19 Vaccine (6 - 2023-24 season) 04/21/2023 (Originally 04/03/2023)   HEMOGLOBIN A1C  08/12/2023   Diabetic kidney evaluation - eGFR measurement  02/09/2024   FOOT EXAM  02/09/2024   Medicare Annual Wellness (AWV)  04/04/2024   DTaP/Tdap/Td (2 - Td or Tdap) 11/01/2024   Pneumonia Vaccine 26+ Years old  Completed   INFLUENZA VACCINE  Completed   Zoster Vaccines- Shingrix  Completed   HPV VACCINES  Aged Out   Hepatitis C Screening  Discontinued   Immunization History  Administered Date(s) Administered   Fluad Quad(high Dose 65+) 05/16/2019, 04/26/2020, 04/24/2021, 05/06/2022   Influenza, High Dose Seasonal PF 07/25/2021, 02/02/2023   Moderna SARS-COV2 Booster Vaccination 01/20/2021   Moderna Sars-Covid-2 Vaccination 08/26/2019, 09/23/2019, 04/25/2020   PFIZER(Purple Top)SARS-COV-2 Vaccination 02/06/2023   Pfizer(Comirnaty)Fall Seasonal Vaccine 12 years and older 05/06/2022   Pneumococcal Conjugate-13 08/07/2015   Pneumococcal Polysaccharide-23 08/26/2018   Tdap 11/02/2014   Zoster Recombinant(Shingrix) 08/28/2022, 10/28/2022    These are the patient goals that we discussed:  Goals Addressed               This Visit's Progress     Patient Stated (pt-stated)        Patient stated that he has started exercising more and he would like to maintain that to be able to get his energy back.         This is a list of Health Maintenance Items that are overdue or due now: Diabetic eye exam  Orders/Referrals Placed Today: No orders of the  defined types were placed in this encounter.  (Contact our referral department at [phone] if you have not spoken with someone about your referral appointment within the next 5 days)    Follow-up Plan Follow-up with Jomarie Longs, PA-C as planned Diabetic eye exam scheduled for 06/03/23 at 1 pm.  Medicare wellness visit in one year.  AVS printed and mailed to the patient.       Health Maintenance, Male Adopting a healthy lifestyle and getting preventive care are important in promoting health and wellness. Ask your health care provider about: The right schedule for you to have regular tests and exams. Things you can do on your own to prevent diseases and keep yourself healthy. What should I know about diet, weight, and exercise? Eat a healthy diet  Eat a diet that includes plenty of vegetables, fruits, low-fat dairy products, and lean protein. Do not eat a lot of foods that are high in solid fats, added sugars, or sodium. Maintain a healthy weight Body mass index (BMI) is a measurement that can be used to identify possible weight problems. It estimates body fat based on height and weight. Your health care provider can help determine your BMI and help you achieve or maintain a healthy weight. Get regular exercise Get regular exercise. This is one of the most important things you can do for your health. Most adults should: Exercise for at least 150 minutes each week. The exercise should increase your heart rate and make you sweat (moderate-intensity exercise). Do strengthening exercises at least  twice a week. This is in addition to the moderate-intensity exercise. Spend less time sitting. Even light physical activity can be beneficial. Watch cholesterol and blood lipids Have your blood tested for lipids and cholesterol at 82 years of age, then have this test every 5 years. You may need to have your cholesterol levels checked more often if: Your lipid or cholesterol levels are  high. You are older than 82 years of age. You are at high risk for heart disease. What should I know about cancer screening? Many types of cancers can be detected early and may often be prevented. Depending on your health history and family history, you may need to have cancer screening at various ages. This may include screening for: Colorectal cancer. Prostate cancer. Skin cancer. Lung cancer. What should I know about heart disease, diabetes, and high blood pressure? Blood pressure and heart disease High blood pressure causes heart disease and increases the risk of stroke. This is more likely to develop in people who have high blood pressure readings or are overweight. Talk with your health care provider about your target blood pressure readings. Have your blood pressure checked: Every 3-5 years if you are 21-24 years of age. Every year if you are 19 years old or older. If you are between the ages of 40 and 10 and are a current or former smoker, ask your health care provider if you should have a one-time screening for abdominal aortic aneurysm (AAA). Diabetes Have regular diabetes screenings. This checks your fasting blood sugar level. Have the screening done: Once every three years after age 46 if you are at a normal weight and have a low risk for diabetes. More often and at a younger age if you are overweight or have a high risk for diabetes. What should I know about preventing infection? Hepatitis B If you have a higher risk for hepatitis B, you should be screened for this virus. Talk with your health care provider to find out if you are at risk for hepatitis B infection. Hepatitis C Blood testing is recommended for: Everyone born from [birth year] through 1965. Anyone with known risk factors for hepatitis C. Sexually transmitted infections (STIs) You should be screened each year for STIs, including gonorrhea and chlamydia, if: You are sexually active and are younger than 82 years of  age. You are older than 82 years of age and your health care provider tells you that you are at risk for this type of infection. Your sexual activity has changed since you were last screened, and you are at increased risk for chlamydia or gonorrhea. Ask your health care provider if you are at risk. Ask your health care provider about whether you are at high risk for HIV. Your health care provider may recommend a prescription medicine to help prevent HIV infection. If you choose to take medicine to prevent HIV, you should first get tested for HIV. You should then be tested every 3 months for as long as you are taking the medicine. Follow these instructions at home: Alcohol use Do not drink alcohol if your health care provider tells you not to drink. If you drink alcohol: Limit how much you have to 0-2 drinks a day. Know how much alcohol is in your drink. In the [place] one drink equals one 12 oz bottle of beer (355 mL), one 5 oz glass of wine (148 mL), or one 1 oz glass of hard liquor (44 mL). Lifestyle Do not use any products that contain nicotine  or tobacco. These products include cigarettes, chewing tobacco, and vaping devices, such as e-cigarettes. If you need help quitting, ask your health care provider. Do not use street drugs. Do not share needles. Ask your health care provider for help if you need support or information about quitting drugs. General instructions Schedule regular health, dental, and eye exams. Stay current with your vaccines. Tell your health care provider if: You often feel depressed. You have ever been abused or do not feel safe at home. Summary Adopting a healthy lifestyle and getting preventive care are important in promoting health and wellness. Follow your health care provider's instructions about healthy diet, exercising, and getting tested or screened for diseases. Follow your health care provider's instructions on monitoring your cholesterol and blood  pressure. This information is not intended to replace advice given to you by your health care provider. Make sure you discuss any questions you have with your health care provider. Document Revised: 11/04/2020 Document Reviewed: 11/04/2020 Elsevier Patient Education  2024 [employer].

## 2023-04-08 ENCOUNTER — Other Ambulatory Visit: Payer: Self-pay | Admitting: Physician Assistant

## 2023-04-08 DIAGNOSIS — E1122 Type 2 diabetes mellitus with diabetic chronic kidney disease: Secondary | ICD-10-CM

## 2023-04-10 ENCOUNTER — Other Ambulatory Visit: Payer: Self-pay | Admitting: Physician Assistant

## 2023-04-10 ENCOUNTER — Other Ambulatory Visit: Payer: Self-pay | Admitting: Cardiology

## 2023-04-10 DIAGNOSIS — E1122 Type 2 diabetes mellitus with diabetic chronic kidney disease: Secondary | ICD-10-CM

## 2023-04-10 DIAGNOSIS — I480 Paroxysmal atrial fibrillation: Secondary | ICD-10-CM

## 2023-04-12 NOTE — Telephone Encounter (Signed)
Prescription refill request for Eliquis received. Indication:afib Last office visit:5/24 Scr:2.14  8/24 Age: 82 Weight:103.9  kg  Prescription refilled

## 2023-04-19 ENCOUNTER — Ambulatory Visit: Payer: Medicare Other | Attending: Cardiology

## 2023-04-19 DIAGNOSIS — Z95810 Presence of automatic (implantable) cardiac defibrillator: Secondary | ICD-10-CM

## 2023-04-19 DIAGNOSIS — I5022 Chronic systolic (congestive) heart failure: Secondary | ICD-10-CM | POA: Diagnosis not present

## 2023-04-22 NOTE — Progress Notes (Signed)
EPIC Encounter for ICM Monitoring  Patient Name: Earl Radhakrishnan Sr. is a 82 y.o. male Date: 04/22/2023 Primary Care Physican: Nolene Ebbs Primary Cardiologist: Jens Som Electrophysiologist: Townsend Roger Pacing:  96% Previous report 03/26/2022 Weight: 228 lbs 06/10/2022 Office Weight: 238 10/02/2022 Office Weight: 235 lbs   AT/AF Burden <1%  previous report          Transmission reviewed.    CorVue thoracic impedance suggesting normal fluid levels within the last month.   Prescribed:   Furosemide 40 mg 1 tablet (40 mg total) daily.    Spironolactone 25 mg take 0.5 tablet (12.5 mg total) daily Eliquis 5 mg take 1 tablet twice a day.   Labs: 02/09/2023 Creatinine 2.14, BUN 42, Potassium 4.7, Sodium 141, GFR 30 11/11/2022 Creatinine 1.41, BUN 22, Potassium 4.9, Sodium 143, GFR 50 10/02/2022 Creatinine 1.16, BUN 17, Potassium 4.7, Sodium 143, GFR 63 10/01/2022 Creatinine 1.22, BUN 18, potassium 4.4, Sodium 144, GFR 60 A complete set of results can be found in Results Review.   Recommendations:  No changes.    Follow-up plan: ICM clinic phone appointment on 05/31/2023.   91 day device clinic remote transmission 07/23/2023.     EP/Cardiology Office Visits:  Recall 11/04/2022 with Dr Jens Som (6 month f/u).  05/10/2023 with Dr Lalla Brothers.       Copy of ICM check sent to Dr. Lalla Brothers.    3 month ICM trend: 04/17/2023.    Karie Soda, RN 04/22/2023 3:35 PM

## 2023-05-10 ENCOUNTER — Ambulatory Visit: Payer: Medicare Other | Admitting: Cardiology

## 2023-05-12 ENCOUNTER — Encounter: Payer: Self-pay | Admitting: Physician Assistant

## 2023-05-12 ENCOUNTER — Ambulatory Visit (INDEPENDENT_AMBULATORY_CARE_PROVIDER_SITE_OTHER): Payer: Medicare Other | Admitting: Physician Assistant

## 2023-05-12 VITALS — BP 107/53 | HR 64 | Ht 70.0 in | Wt 222.0 lb

## 2023-05-12 DIAGNOSIS — I5023 Acute on chronic systolic (congestive) heart failure: Secondary | ICD-10-CM

## 2023-05-12 DIAGNOSIS — Z7984 Long term (current) use of oral hypoglycemic drugs: Secondary | ICD-10-CM | POA: Diagnosis not present

## 2023-05-12 DIAGNOSIS — E559 Vitamin D deficiency, unspecified: Secondary | ICD-10-CM | POA: Diagnosis not present

## 2023-05-12 DIAGNOSIS — E1122 Type 2 diabetes mellitus with diabetic chronic kidney disease: Secondary | ICD-10-CM | POA: Diagnosis not present

## 2023-05-12 DIAGNOSIS — I420 Dilated cardiomyopathy: Secondary | ICD-10-CM | POA: Diagnosis not present

## 2023-05-12 DIAGNOSIS — I10 Essential (primary) hypertension: Secondary | ICD-10-CM

## 2023-05-12 DIAGNOSIS — E782 Mixed hyperlipidemia: Secondary | ICD-10-CM

## 2023-05-12 DIAGNOSIS — Z23 Encounter for immunization: Secondary | ICD-10-CM

## 2023-05-12 LAB — POCT GLYCOSYLATED HEMOGLOBIN (HGB A1C): Hemoglobin A1C: 6 % — AB (ref 4.0–5.6)

## 2023-05-12 NOTE — Progress Notes (Signed)
Established Patient Office Visit  Subjective   Patient ID: Earl Schooling Sr., male    DOB: 06-18-1941  Age: 82 y.o. MRN: [id number]  Chief Complaint  Patient presents with   Medical Management of Chronic Issues    HPI Pt is a 82 yo obese male with T2DM, HTN, CHF, HLD who presents to the clinic for 3 month follow up.   He is doing well today. He has lost about 12lbs and feels a lot better! He is followed by cardiology and has ICD. He is compliant with medications. He follows a low sodium diet. He feels like he has more energy. He denies any lower extremity edema.    .. Active Ambulatory Problems    Diagnosis Date Noted   Essential hypertension, benign 04/28/2013   BPH (benign prostatic hyperplasia) 04/28/2013   Gout 04/28/2013   Congestive dilated cardiomyopathy (HCC) 05/10/2013   ICD (implantable cardioverter-defibrillator) in place 05/10/2013   Elevated PSA 11/24/2013   Systolic dysfunction with acute on chronic heart failure (HCC) 09/03/2014   Morbid obesity (HCC) 09/03/2014   Erectile dysfunction 03/10/2015   Lower extremity edema 09/29/2017   Abnormal ultrasound of lower extremity 09/29/2017   Malignant neoplasm of prostate (HCC) 02/03/2018   Family history of cancer 02/03/2018   Family history of prostate cancer    Family history of breast cancer    Genetic testing 03/25/2018   Hypotension 05/23/2018   Paroxysmal atrial fibrillation (HCC) 12/25/2015   History of gout 08/01/2018   Type 2 diabetes mellitus with chronic kidney disease, 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HCC) 08/01/2018   Acute left-sided low back pain without sciatica 05/16/2019   Syncope and collapse 05/24/2019   Chronic systolic heart failure (HCC) 06/08/2019   Ventricular fibrillation (HCC) 08/16/2019   Fall 08/16/2019   Impetigo 08/18/2019   Rectal bleeding 12/11/2019   Mixed hyperlipidemia 12/11/2019   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   Vitamin D deficiency 11/03/2021    SOB (shortness of breath) 11/04/2021   Localized swelling of both lower extremities 11/04/2021   Restrictive pattern present on pulmonary function testing 11/18/2021   Arthritis of left wrist 02/04/2022   Left arm swelling 02/06/2022   Left wrist pain 02/06/2022   Arthritis of multiple sites 11/04/2022   Resolved Ambulatory Problems    Diagnosis Date Noted   Diabetes mellitus due to underlying condition, controlled, without complication, 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HCC) 04/28/2013   Past Medical History:  Diagnosis Date   CHF (congestive heart failure) (HCC)    Diabetes mellitus (HCC)    Hyperlipidemia    Hypertension    Nonischemic cardiomyopathy (HCC)    Prostate cancer (HCC)      ROS See HPI.    Objective:     BP (!) 107/53   Pulse 64   Ht 5\' 10"  (1.778 m)   Wt 222 lb (100.7 kg)   SpO2 99%   BMI 31.85 kg/m  BP Readings from Last 3 Encounters:  05/12/23 (!) 107/53  02/09/23 (!) 103/42  11/11/22 (!) 104/58   Wt Readings from Last 3 Encounters:  05/12/23 222 lb (100.7 kg)  02/09/23 229 lb (103.9 kg)  11/11/22 234 lb (106.1 kg)    .[name] Results for orders placed or performed in visit on 05/12/23  POCT HgB A1C  Result Value Ref Range   Hemoglobin A1C 6.0 (A) 4.0 - 5.6 %   HbA1c POC (<> result, manual entry)     HbA1c, POC (prediabetic range)  HbA1c, POC (controlled diabetic range)       Physical Exam Constitutional:      Appearance: Normal appearance. He is obese.  HENT:     Head: Normocephalic.  Cardiovascular:     Rate and Rhythm: Normal rate and regular rhythm.     Heart sounds: Murmur heard.  Pulmonary:     Effort: Pulmonary effort is normal.     Breath sounds: Normal breath sounds.  Musculoskeletal:     Right lower leg: No edema.     Left lower leg: No edema.  Neurological:     General: No focal deficit present.     Mental Status: He is alert and oriented to person, place, and time.  Psychiatric:        Mood and Affect: Mood  normal.         Assessment & Plan:  [name] KitchenMarland KitchenHernesto was seen today for medical management of chronic issues.  Diagnoses and all orders for this visit:  Type 2 diabetes mellitus with chronic kidney disease, without long-term current use of insulin, unspecified CKD stage (HCC) -     POCT HgB A1C -     POCT UA - Microalbumin -     Lipid panel -     CMP14+EGFR -     TSH -     CBC w/Diff/Platelet -     VITAMIN D 25 Hydroxy (Vit-D Deficiency, Fractures) -     Dulaglutide (TRULICITY) 1.5 MG/0.5ML SOAJ; INJECT 1.5 MG (0.5ML)  SUBCUTANEOUSLY ONCE A WEEK  Congestive dilated cardiomyopathy (HCC) -     CMP14+EGFR -     carvedilol (COREG) 25 MG tablet; Take 1 tablet (25 mg total) by mouth 2 (two) times daily with a meal. TAKE 1 TABLET BY MOUTH TWICE DAILY WITH A MEAL .  Essential hypertension, benign -     CMP14+EGFR -     carvedilol (COREG) 25 MG tablet; Take 1 tablet (25 mg total) by mouth 2 (two) times daily with a meal. TAKE 1 TABLET BY MOUTH TWICE DAILY WITH A MEAL .  Vitamin D deficiency -     VITAMIN D 25 Hydroxy (Vit-D Deficiency, Fractures)  Mixed hyperlipidemia -     Lipid panel -     atorvastatin (LIPITOR) 80 MG tablet; TAKE 1 TABLET BY MOUTH ONCE DAILY.  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 TAKE 1 TABLET BY MOUTH TWICE DAILY WITH A MEAL .   A1C looks great! Continue same dose BP to goal, continue same dosages and continue follow up with cardiology On statin, lipid ordered today Needs eye exam Covid vaccine given today Flu/shingles/pneumonia vaccine UTD  Labs ordered today.  Refilled lipitor/coreg   Return in about 3 months (around 08/12/2023).    [name], PA-C

## 2023-05-14 ENCOUNTER — Encounter: Payer: Self-pay | Admitting: Physician Assistant

## 2023-05-14 MED ORDER — TRULICITY 1.5 MG/0.5ML ~~LOC~~ SOAJ: SUBCUTANEOUS | 1 refills | Status: DC

## 2023-05-14 MED ORDER — ATORVASTATIN CALCIUM 80 MG PO TABS
ORAL_TABLET | ORAL | 0 refills | Status: DC
Start: 2023-05-14 — End: 2023-11-03

## 2023-05-14 MED ORDER — CARVEDILOL 25 MG PO TABS: 25.0000 mg | ORAL_TABLET | Freq: Two times a day (BID) | ORAL | 3 refills | Status: DC

## 2023-05-18 DIAGNOSIS — I10 Essential (primary) hypertension: Secondary | ICD-10-CM | POA: Diagnosis not present

## 2023-05-18 DIAGNOSIS — I420 Dilated cardiomyopathy: Secondary | ICD-10-CM | POA: Diagnosis not present

## 2023-05-18 DIAGNOSIS — E1122 Type 2 diabetes mellitus with diabetic chronic kidney disease: Secondary | ICD-10-CM | POA: Diagnosis not present

## 2023-05-18 DIAGNOSIS — E559 Vitamin D deficiency, unspecified: Secondary | ICD-10-CM | POA: Diagnosis not present

## 2023-05-19 LAB — CBC WITH DIFFERENTIAL/PLATELET
Basophils Absolute: 0 10*3/uL (ref 0.0–0.2)
Basos: 1 %
EOS (ABSOLUTE): 0.1 10*3/uL (ref 0.0–0.4)
Eos: 2 %
Hematocrit: 41.3 % (ref 37.5–51.0)
Hemoglobin: 13.1 g/dL (ref 13.0–17.7)
Immature Grans (Abs): 0 10*3/uL (ref 0.0–0.1)
Immature Granulocytes: 0 %
Lymphocytes Absolute: 1.7 10*3/uL (ref 0.7–3.1)
Lymphs: 28 %
MCH: 30.3 pg (ref 26.6–33.0)
MCHC: 31.7 g/dL (ref 31.5–35.7)
MCV: 95 fL (ref 79–97)
Monocytes Absolute: 0.6 10*3/uL (ref 0.1–0.9)
Monocytes: 10 %
Neutrophils Absolute: 3.4 10*3/uL (ref 1.4–7.0)
Neutrophils: 59 %
Platelets: 196 10*3/uL (ref 150–450)
RBC: 4.33 x10E6/uL (ref 4.14–5.80)
RDW: 13.3 % (ref 11.6–15.4)
WBC: 5.9 10*3/uL (ref 3.4–10.8)

## 2023-05-19 LAB — LIPID PANEL
Chol/HDL Ratio: 2.6 ratio (ref 0.0–5.0)
Cholesterol, Total: 131 mg/dL (ref 100–199)
HDL: 50 mg/dL (ref >39)
LDL Chol Calc (NIH): 67 mg/dL (ref 0–99)
Triglycerides: 67 mg/dL (ref 0–149)
VLDL Cholesterol Cal: 14 mg/dL (ref 5–40)

## 2023-05-19 LAB — CMP14+EGFR
ALT: 25 [IU]/L (ref 0–44)
AST: 23 [IU]/L (ref 0–40)
Albumin: 4.2 g/dL (ref 3.7–4.7)
Alkaline Phosphatase: 75 [IU]/L (ref 44–121)
BUN/Creatinine Ratio: 16 (ref 10–24)
BUN: 23 mg/dL (ref 8–27)
Bilirubin Total: 0.4 mg/dL (ref 0.0–1.2)
CO2: 24 mmol/L (ref 20–29)
Calcium: 9.5 mg/dL (ref 8.6–10.2)
Chloride: 109 mmol/L — ABNORMAL HIGH (ref 96–106)
Creatinine, Ser: 1.4 mg/dL — ABNORMAL HIGH (ref 0.76–1.27)
Globulin, Total: 2.2 g/dL (ref 1.5–4.5)
Glucose: 96 mg/dL (ref 70–99)
Potassium: 5.1 mmol/L (ref 3.5–5.2)
Sodium: 145 mmol/L — ABNORMAL HIGH (ref 134–144)
Total Protein: 6.4 g/dL (ref 6.0–8.5)
eGFR: 50 mL/min/{1.73_m2} — ABNORMAL LOW (ref >59)

## 2023-05-19 LAB — VITAMIN D 25 HYDROXY (VIT D DEFICIENCY, FRACTURES): Vit D, 25-Hydroxy: 34.2 ng/mL (ref 30.0–100.0)

## 2023-05-19 LAB — TSH: TSH: 2.1 u[IU]/mL (ref 0.450–4.500)

## 2023-05-19 NOTE — Progress Notes (Signed)
Elnatan,   Kidney function is MUCH better.   Sodium up some from baseline.   Cholesterol looks GREAT!   Vitamin d low normal. Are you taking vitamin D currently?

## 2023-05-31 ENCOUNTER — Ambulatory Visit: Payer: Medicare Other | Attending: Cardiology | Admitting: Cardiology

## 2023-05-31 NOTE — Progress Notes (Unsigned)
Electrophysiology Office Note:   Date:  05/31/2023  ID:  Earl Real Sr., DOB 11-08-1940, MRN [id number]  Primary Cardiologist: Olga Millers, MD Electrophysiologist: Nobie Putnam, MD  {Click to update primary MD,subspecialty MD or APP then REFRESH:1}    History of Present Illness:   Earl Feightner Sr. is a 82 y.o. male with h/o PAF, presumed CAD, Chronic systolic CHF, VT, ICD, HTN, HLD, and h/o AS  seen today for routine electrophysiology followup.   Seen 10/02/2022 with ICD shock. Noted to be off his amiodarone. Restarted at that visit.   Since last being seen in our clinic the patient reports doing ***.  he denies chest pain, palpitations, dyspnea, PND, orthopnea, nausea, vomiting, dizziness, syncope, edema, weight gain, or early satiety.   Review of systems complete and found to be negative unless listed in HPI.   EP Information / Studies Reviewed:    {EKGtoday:28818}      Nuclear Stress 09/2015:  The left ventricular ejection fraction is severely decreased (<30%). Nuclear stress EF: 24%. No T wave inversion was noted during stress. There was no ST segment deviation noted during stress. Defect 1: There is a large defect of moderate severity. Findings consistent with prior myocardial infarction with peri-infarct ischemia. This is a high risk study.   Large, severe partially reversible inferolateral perfusion defect suggestive of scar with minimal peri-infarct ischemia (SDS 4). LVEF 24% with global hypokinesis and inferolateral akinesis. This is a high risk study.   Echo 10/09/14: Study Conclusions - Left ventricle: The cavity size was moderately dilated. Wall    thickness was increased in a pattern of mild LVH. Inferior and    Inferolateral akinesis. Anterolateral severe hypokinesis.    Systolic function was severely reduced. The estimated ejection    fraction was in the range of 25% to 30%. Doppler parameters are    consistent with abnormal left ventricular relaxation  (grade 1    diastolic dysfunction).  - Aortic valve: Trileaflet; moderately calcified leaflets. Not    fully interrogated by doppler.  - Mitral valve: Mildly calcified annulus. There was no significant    regurgitation.  - Left atrium: The atrium was mildly dilated.  - Right ventricle: The cavity size was normal. Pacer wire or    catheter noted in right ventricle. Systolic function was mildly    reduced.  - Pulmonary arteries: No complete TR doppler jet so unable to    estimate PA systolic pressure.  - Inferior vena cava: The vessel was normal in size. The    respirophasic diameter changes were in the normal range (>= 50%),    consistent with normal central venous pressure.   Risk Assessment/Calculations:    CHA2DS2-VASc Score =    {Click here to calculate score.  REFRESH note before signing. :1} This indicates a  % annual risk of stroke. The patient's score is based upon:      No BP recorded.  {Refresh Note OR Click here to enter BP  :1}***        Physical Exam:   VS:  There were no vitals taken for this visit.   Wt Readings from Last 3 Encounters:  05/12/23 222 lb (100.7 kg)  02/09/23 229 lb (103.9 kg)  11/11/22 234 lb (106.1 kg)     GEN: Well nourished, well developed in no acute distress NECK: No JVD; No carotid bruits CARDIAC: {EPRHYTHM:28826}, no murmurs, rubs, gallops RESPIRATORY:  Clear to auscultation without rales, wheezing or rhonchi  ABDOMEN: Soft, non-tender, non-distended EXTREMITIES:  No  edema; No deformity   ASSESSMENT AND PLAN:   Chronic systolic dysfunction s/p Abbott CRT-D  Echo 01/2022 LVEF 20-25%. Consider updating if has more VT or worsening symptoms, but for now would not change therapy.  euvolemic today Stable on an appropriate medical regimen Normal ICD function See Arita Miss Art report No changes today   VT/VF with ICD shock 3/29 with atrial arrhyhtmia with RVR vs dual tachycardia -> ATP - > fast VT /VF (CL 240) and shock.   Prior shocks in 8/23  and 10/23, and 09/2022 No further on brief check today.  Continue coreg 25 mg BID. If recurs, consider switching to max dose Toprol.  Continue amiodarone 200 mg daily. Stressed importance of compliance.  Restart amiodarone 200 mg BID x 2 weeks, then 200 mg daily.  Surveillance labs today.    Paroxysmal AF/AFL Continue eliquis 5 mg BID for CHA2DS2/VASc of at least 5.  Amiodarone as above.    Cardiomyopathy ? CAD Previous nuclear study showed infarct but no ischemia. He refused catheterization. If VT recurs on amiodarone, would recommend heart cath.   Follow up with {SWNIO:27035} {EPFOLLOW KK:93818}  Signed, Nobie Putnam, MD

## 2023-06-01 ENCOUNTER — Encounter: Payer: Self-pay | Admitting: Cardiology

## 2023-06-07 ENCOUNTER — Telehealth: Payer: Self-pay

## 2023-06-07 NOTE — Telephone Encounter (Signed)
Spoke with patient.  Advised device monitor is disconnected and unable to receive reports.  He reports he has a monitor by his bed.  Explained [employer] shows he has a Medical sales representative which he says he does not.  Provided [employer] support number and advised to call.  ICM remote transmission rescheduled for 07/05/2023.

## 2023-06-25 ENCOUNTER — Other Ambulatory Visit: Payer: Self-pay | Admitting: Physician Assistant

## 2023-06-25 DIAGNOSIS — N521 Erectile dysfunction due to diseases classified elsewhere: Secondary | ICD-10-CM

## 2023-07-05 ENCOUNTER — Ambulatory Visit: Payer: Medicare Other | Attending: Cardiology

## 2023-07-05 DIAGNOSIS — Z95810 Presence of automatic (implantable) cardiac defibrillator: Secondary | ICD-10-CM

## 2023-07-05 DIAGNOSIS — I5022 Chronic systolic (congestive) heart failure: Secondary | ICD-10-CM

## 2023-07-07 NOTE — Progress Notes (Signed)
 EPIC Encounter for ICM Monitoring  Patient Name: Earl Koppelman Sr. is a 83 y.o. male Date: 07/07/2023 Primary Care Physican: Earl White Primary Cardiologist: Earl White Electrophysiologist: Earl White Pacing:  96% 03/26/2022 Weight: 228 lbs 06/10/2022 Office Weight: 238 10/02/2022 Office Weight: 235 lbs 07/07/2023 Weight: 228 lbs   AT/AF Burden <1%            Spoke with patient and heart failure questions reviewed.  Transmission results reviewed.  Pt asymptomatic for fluid accumulation.  Reports feeling well at this time and voices no complaints.     CorVue thoracic impedance suggesting normal fluid levels within the last month with the exception of  12/25-12/31.   Prescribed:   Furosemide  40 mg 1 tablet (40 mg total) daily.    Spironolactone  25 mg take 0.5 tablet (12.5 mg total) daily Eliquis  5 mg take 1 tablet twice a day.   Labs: 05/18/2023 Creatinine 1.40, BUN 23, Potassium 5.1, Sodium 145, GFR 50 02/09/2023 Creatinine 2.14, BUN 42, Potassium 4.7, Sodium 141, GFR 30 11/11/2022 Creatinine 1.41, BUN 22, Potassium 4.9, Sodium 143, GFR 50 10/02/2022 Creatinine 1.16, BUN 17, Potassium 4.7, Sodium 143, GFR 63 10/01/2022 Creatinine 1.22, BUN 18, potassium 4.4, Sodium 144, GFR 60 A complete set of results can be found in Results Review.   Recommendations:  No changes and encouraged to call if experiencing any fluid symptoms.   Follow-up plan: ICM clinic phone appointment on 08/09/2023.   91 day device clinic remote transmission 07/23/2023.     EP/Cardiology Office Visits:  Recall 11/04/2022 with Dr Earl White (6 month f/u).       Copy of ICM check sent to Dr. Cindie.    3 month ICM trend: 07/05/2023.    12-14 Month ICM trend:     Earl GORMAN Garner, RN 07/07/2023 4:37 PM

## 2023-07-15 ENCOUNTER — Other Ambulatory Visit: Payer: Self-pay | Admitting: Physician Assistant

## 2023-07-15 DIAGNOSIS — E1122 Type 2 diabetes mellitus with diabetic chronic kidney disease: Secondary | ICD-10-CM

## 2023-07-16 ENCOUNTER — Other Ambulatory Visit: Payer: Self-pay | Admitting: Physician Assistant

## 2023-07-16 ENCOUNTER — Telehealth: Payer: Self-pay

## 2023-07-16 ENCOUNTER — Telehealth: Payer: Self-pay | Admitting: Physician Assistant

## 2023-07-16 DIAGNOSIS — E1122 Type 2 diabetes mellitus with diabetic chronic kidney disease: Secondary | ICD-10-CM

## 2023-07-16 MED ORDER — SYNJARDY XR 10-1000 MG PO TB24: 1.0000 | ORAL_TABLET | Freq: Every day | ORAL | 0 refills | Status: DC

## 2023-07-16 NOTE — Telephone Encounter (Signed)
Copied from CRM [id number]. Topic: Clinical - Prescription Issue >> Jul 16, 2023  2:12 PM Earl White wrote: Reason for CRM: SYNJARDY XR 03-999 MG TB24 patient is out of medication and needs a two week supply until his new prescription insurance starts up

## 2023-07-16 NOTE — Telephone Encounter (Signed)
Copied from CRM [id number]. Topic: Clinical - Medication Refill >> Jul 16, 2023  2:09 PM Shelah Lewandowsky wrote: Most Recent Primary Care Visit:  Provider: Jomarie Longs  Department: [facility]  Visit Type: OFFICE VISIT  Date: 05/12/2023  Medication: ***  Has the patient contacted their pharmacy?  (Agent: If no, request that the patient contact the pharmacy for the refill. If patient does not wish to contact the pharmacy document the reason why and proceed with request.) (Agent: If yes, when and what did the pharmacy advise?)  Is this the correct pharmacy for this prescription?  If no, delete pharmacy and type the correct one.  This is the patient's preferred pharmacy:  [facility] [address], [place] - [address] [place] [address] Phone: [phone] Fax: [phone]   Has the prescription been filled recently?   Is the patient out of the medication?   Has the patient been seen for an appointment in the last year OR does the patient have an upcoming appointment?   Can we respond through MyChart?   Agent: Please be advised that Rx refills may take up to 3 business days. We ask that you follow-up with your pharmacy.

## 2023-07-16 NOTE — Telephone Encounter (Signed)
Copied from CRM [id number]. Topic: Clinical - Medication Refill >> Jul 16, 2023  1:54 PM Nila Nephew wrote: Most Recent Primary Care Visit:  Provider: Jomarie Longs  Department: [facility]  Visit Type: OFFICE VISIT  Date: 05/12/2023  Medication: SYNJARDY XR 03-999 MG TB24  Has the patient contacted their pharmacy? Yes - Pharmacy Calling to ask why it has not been filled - answered that it was sent yesterday and standard business practice is a 3 business day turn-around.   Is this the correct pharmacy for this prescription? Yes  This is the patient's preferred pharmacy:  [facility] [address] [place] - [address] [place] [address] Phone: [phone] Fax: [phone]   Has the prescription been filled recently? Yes  Is the patient out of the medication? Yes  - one week  Has the patient been seen for an appointment in the last year OR does the patient have an upcoming appointment?   Can we respond through MyChart?   Agent: Please be advised that Rx refills may take up to 3 business days. We ask that you follow-up with your pharmacy.

## 2023-07-16 NOTE — Telephone Encounter (Signed)
Prescription Request  07/16/2023  LOV: 05/12/2023   Patient is requesting 2 week supply  What is the name of the medication or equipment? SYNJARDY XR 03-999 MG TB24   Have you contacted your pharmacy to request a refill? Yes   Which pharmacy would you like this sent to?  [facility] [address], [place] - [address] [place] [address] Phone: [phone] Fax: [phone]    Patient notified that their request is being sent to the clinical staff for review and that they should receive a response within 2 business days.   Please advise at [facility] [phone]

## 2023-07-16 NOTE — Telephone Encounter (Signed)
Medication sent.

## 2023-07-23 ENCOUNTER — Ambulatory Visit (INDEPENDENT_AMBULATORY_CARE_PROVIDER_SITE_OTHER): Payer: Medicare Other

## 2023-07-23 DIAGNOSIS — I255 Ischemic cardiomyopathy: Secondary | ICD-10-CM | POA: Diagnosis not present

## 2023-07-23 LAB — CUP PACEART REMOTE DEVICE CHECK
Battery Remaining Longevity: 58 mo
Battery Remaining Percentage: 62 %
Battery Voltage: 2.98 V
Brady Statistic AP VP Percent: 96 %
Brady Statistic AP VS Percent: 3.4 %
Brady Statistic AS VP Percent: 1 %
Brady Statistic AS VS Percent: 1 %
Brady Statistic RA Percent Paced: 99 %
Date Time Interrogation Session: 20250124020045
HighPow Impedance: 43 Ohm
Implantable Lead Connection Status: 753985
Implantable Lead Connection Status: 753985
Implantable Lead Connection Status: 753985
Implantable Lead Implant Date: 20140117
Implantable Lead Implant Date: 20140117
Implantable Lead Implant Date: 20140117
Implantable Lead Location: 753858
Implantable Lead Location: 753859
Implantable Lead Location: 753860
Implantable Pulse Generator Implant Date: 20220426
Lead Channel Impedance Value: 380 Ohm
Lead Channel Impedance Value: 400 Ohm
Lead Channel Impedance Value: 800 Ohm
Lead Channel Pacing Threshold Amplitude: 0.75 V
Lead Channel Pacing Threshold Amplitude: 1 V
Lead Channel Pacing Threshold Amplitude: 1 V
Lead Channel Pacing Threshold Pulse Width: 0.5 ms
Lead Channel Pacing Threshold Pulse Width: 0.5 ms
Lead Channel Pacing Threshold Pulse Width: 0.5 ms
Lead Channel Sensing Intrinsic Amplitude: 5 mV
Lead Channel Sensing Intrinsic Amplitude: 6.6 mV
Lead Channel Setting Pacing Amplitude: 1.5 V
Lead Channel Setting Pacing Amplitude: 1.75 V
Lead Channel Setting Pacing Amplitude: 2 V
Lead Channel Setting Pacing Pulse Width: 0.5 ms
Lead Channel Setting Pacing Pulse Width: 0.5 ms
Lead Channel Setting Sensing Sensitivity: 0.5 mV
Pulse Gen Serial Number: 810027402

## 2023-08-09 ENCOUNTER — Ambulatory Visit: Payer: Medicare Other | Attending: Cardiology

## 2023-08-09 DIAGNOSIS — Z95810 Presence of automatic (implantable) cardiac defibrillator: Secondary | ICD-10-CM

## 2023-08-09 DIAGNOSIS — I5022 Chronic systolic (congestive) heart failure: Secondary | ICD-10-CM | POA: Diagnosis not present

## 2023-08-11 NOTE — Progress Notes (Signed)
EPIC Encounter for ICM Monitoring  Patient Name: Earl Holtman Sr. is a 83 y.o. male Date: 08/11/2023 Primary Care Physican: Nolene Ebbs Primary Cardiologist: Jens Som Electrophysiologist: Townsend Roger Pacing:  96% 03/26/2022 Weight: 228 lbs 06/10/2022 Office Weight: 238 10/02/2022 Office Weight: 235 lbs 07/07/2023 Weight: 228 lbs 08/11/2023 Weight: 231 lbs   AT/AF Burden <1%            Spoke with patient and heart failure questions reviewed.  Transmission results reviewed.  Pt asymptomatic for fluid accumulation.  Reports feeling well at this time and voices no complaints.     CorVue thoracic impedance suggesting normal fluid levels within the last month with the exception of  12/25-12/31.   Prescribed:   Furosemide 40 mg 1 tablet (40 mg total) daily.    Spironolactone 25 mg take 0.5 tablet (12.5 mg total) daily Eliquis 5 mg take 1 tablet twice a day.   Labs: 05/18/2023 Creatinine 1.40, BUN 23, Potassium 5.1, Sodium 145, GFR 50 02/09/2023 Creatinine 2.14, BUN 42, Potassium 4.7, Sodium 141, GFR 30 11/11/2022 Creatinine 1.41, BUN 22, Potassium 4.9, Sodium 143, GFR 50 10/02/2022 Creatinine 1.16, BUN 17, Potassium 4.7, Sodium 143, GFR 63 10/01/2022 Creatinine 1.22, BUN 18, potassium 4.4, Sodium 144, GFR 60 A complete set of results can be found in Results Review.   Recommendations:  No changes and encouraged to call if experiencing any fluid symptoms.   Follow-up plan: ICM clinic phone appointment on 09/13/2023.   91 day device clinic remote transmission 10/22/2023.     EP/Cardiology Office Visits:  Recall 11/04/2022 with Dr Jens Som (6 month f/u).       Copy of ICM check sent to Dr. Lalla Brothers.    3 month ICM trend: 08/09/2023.    12-14 Month ICM trend:     Karie Soda, RN 08/11/2023 4:39 PM

## 2023-08-16 ENCOUNTER — Encounter: Payer: Self-pay | Admitting: Physician Assistant

## 2023-08-16 ENCOUNTER — Ambulatory Visit (INDEPENDENT_AMBULATORY_CARE_PROVIDER_SITE_OTHER): Payer: Medicare Other | Admitting: Physician Assistant

## 2023-08-16 VITALS — BP 110/58 | Ht 70.0 in | Wt 235.8 lb

## 2023-08-16 DIAGNOSIS — I480 Paroxysmal atrial fibrillation: Secondary | ICD-10-CM | POA: Diagnosis not present

## 2023-08-16 DIAGNOSIS — I5023 Acute on chronic systolic (congestive) heart failure: Secondary | ICD-10-CM

## 2023-08-16 DIAGNOSIS — I10 Essential (primary) hypertension: Secondary | ICD-10-CM

## 2023-08-16 DIAGNOSIS — R600 Localized edema: Secondary | ICD-10-CM

## 2023-08-16 DIAGNOSIS — Z7985 Long-term (current) use of injectable non-insulin antidiabetic drugs: Secondary | ICD-10-CM | POA: Diagnosis not present

## 2023-08-16 DIAGNOSIS — Z7984 Long term (current) use of oral hypoglycemic drugs: Secondary | ICD-10-CM

## 2023-08-16 DIAGNOSIS — E1122 Type 2 diabetes mellitus with diabetic chronic kidney disease: Secondary | ICD-10-CM | POA: Diagnosis not present

## 2023-08-16 DIAGNOSIS — I420 Dilated cardiomyopathy: Secondary | ICD-10-CM | POA: Diagnosis not present

## 2023-08-16 DIAGNOSIS — Z95810 Presence of automatic (implantable) cardiac defibrillator: Secondary | ICD-10-CM | POA: Diagnosis not present

## 2023-08-16 LAB — POCT GLYCOSYLATED HEMOGLOBIN (HGB A1C): Hemoglobin A1C: 6.2 % — AB (ref 4.0–5.6)

## 2023-08-16 NOTE — Patient Instructions (Signed)
Take lasix daily for the next 5 days.  Keep legs elevated.

## 2023-08-16 NOTE — Progress Notes (Signed)
Established Patient Office Visit  Subjective   Patient ID: Earl Labarge Sr., male    DOB: Apr 22, 1941  Age: 83 y.o. MRN: [id number]  HPI Patient is an 83 yo [place] American male with T2DM, HTN, CHF, HLD who presents to clinic for 3 month follow up.  He is taking all medications except his lasix. He does not like going to the bathroom so much every day. He never takes on road trips and he just got back from [place].   He is doing well today. He states his weight has been pretty stable. Weight ranging 226-233lbs.  He is followed by cardiology and has an ICD. His ICD has not gone off. He is not checking his BP at home. He is not checking his sugars at home. He denies any chest pain, trouble breathing, dizziness, headaches, or vision changes. He does get short of breath easily. He admits he is having a little more lower extremity edema over past few days.   Pt is not checking his sugars.  He states his diet is 2 meals/day. He has been trying to avoid sweets. He has been eating a lot of nuts and veggies. He does eat a lot of potatoes, french fries. He uses a stationary bike, leg machines, and pull ups. He knows he needs to walk more.   Review of Systems  All other systems reviewed and are negative.    Objective:     BP (!) 110/58   Ht 5\' 10"  (1.778 m)   Wt 235 lb 12.8 oz (107 kg)   SpO2 99%   BMI 33.83 kg/m  BP Readings from Last 3 Encounters:  08/16/23 (!) 110/58  05/12/23 (!) 107/53  02/09/23 (!) 103/42   Wt Readings from Last 3 Encounters:  08/16/23 235 lb 12.8 oz (107 kg)  05/12/23 222 lb (100.7 kg)  02/09/23 229 lb (103.9 kg)   Physical Exam Constitutional:      Appearance: He is obese.  HENT:     Head: Normocephalic and atraumatic.  Eyes:     Extraocular Movements: Extraocular movements intact.  Cardiovascular:     Rate and Rhythm: Normal rate and regular rhythm.  Pulmonary:     Effort: Pulmonary effort is normal.     Breath sounds: Normal breath sounds.   Musculoskeletal:     Cervical back: Normal range of motion.     Right lower leg: Edema present.     Left lower leg: Edema present.     Comments: 2+ pitting edema bilaterally  Neurological:     Mental Status: He is alert.    .. Results for orders placed or performed in visit on 08/16/23  POCT HgB A1C   Collection Time: 08/16/23  2:22 PM  Result Value Ref Range   Hemoglobin A1C 6.2 (A) 4.0 - 5.6 %   HbA1c POC (<> result, manual entry)     HbA1c, POC (prediabetic range)     HbA1c, POC (controlled diabetic range)    CMP14+EGFR   Collection Time: 08/16/23  2:29 PM  Result Value Ref Range   Glucose 118 (H) 70 - 99 mg/dL   BUN 30 (H) 8 - 27 mg/dL   Creatinine, Ser 4.09 (H) 0.76 - 1.27 mg/dL   eGFR 40 (L) >81 XB/JYN/8.29   BUN/Creatinine Ratio 18 10 - 24   Sodium 144 134 - 144 mmol/L   Potassium 5.4 (H) 3.5 - 5.2 mmol/L   Chloride 107 (H) 96 - 106 mmol/L   CO2 25 20 -  29 mmol/L   Calcium 9.6 8.6 - 10.2 mg/dL   Total Protein 6.3 6.0 - 8.5 g/dL   Albumin 4.1 3.7 - 4.7 g/dL   Globulin, Total 2.2 1.5 - 4.5 g/dL   Bilirubin Total 0.4 0.0 - 1.2 mg/dL   Alkaline Phosphatase 87 44 - 121 IU/L   AST 18 0 - 40 IU/L   ALT 23 0 - 44 IU/L        Assessment & Plan:  [name] KitchenMarland KitchenLe was seen today for medical management of chronic issues.  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  - UTD on all vaccines -  Recheck BP: 110/54mmHg - Patient educated on prioritizing protein and vegetables. - Patient educated on aiming for 150 minutes of exercise/week. - Patient is determined to get back down to 225lbs -bilateral edema in legs and not taking lasix as well as weight being up. Start back on lasix daily. Keep legs elevated when you can and wear compression stockings.  - Fasting labs ordered today.  - Patient would like to get back on Viagra, will consult cardiology to make sure this is okay.  - F/u in 3 months to recheck A1C.    [name], PA-C

## 2023-08-17 ENCOUNTER — Encounter: Payer: Self-pay | Admitting: Physician Assistant

## 2023-08-17 DIAGNOSIS — R600 Localized edema: Secondary | ICD-10-CM | POA: Insufficient documentation

## 2023-08-17 LAB — CMP14+EGFR
ALT: 23 [IU]/L (ref 0–44)
AST: 18 [IU]/L (ref 0–40)
Albumin: 4.1 g/dL (ref 3.7–4.7)
Alkaline Phosphatase: 87 [IU]/L (ref 44–121)
BUN/Creatinine Ratio: 18 (ref 10–24)
BUN: 30 mg/dL — ABNORMAL HIGH (ref 8–27)
Bilirubin Total: 0.4 mg/dL (ref 0.0–1.2)
CO2: 25 mmol/L (ref 20–29)
Calcium: 9.6 mg/dL (ref 8.6–10.2)
Chloride: 107 mmol/L — ABNORMAL HIGH (ref 96–106)
Creatinine, Ser: 1.7 mg/dL — ABNORMAL HIGH (ref 0.76–1.27)
Globulin, Total: 2.2 g/dL (ref 1.5–4.5)
Glucose: 118 mg/dL — ABNORMAL HIGH (ref 70–99)
Potassium: 5.4 mmol/L — ABNORMAL HIGH (ref 3.5–5.2)
Sodium: 144 mmol/L (ref 134–144)
Total Protein: 6.3 g/dL (ref 6.0–8.5)
eGFR: 40 mL/min/{1.73_m2} — ABNORMAL LOW (ref >59)

## 2023-08-17 MED ORDER — SILDENAFIL CITRATE 100 MG PO TABS: 100.0000 mg | ORAL_TABLET | ORAL | 2 refills | Status: AC | PRN

## 2023-08-17 NOTE — Progress Notes (Signed)
Earl White,   Your kidney looks dry so you do need to drink more water but you are holding on to fluid and your potassium is elevated so you need to take your lasix daily and recheck labs(BMP) in 1 week.

## 2023-08-17 NOTE — Addendum Note (Signed)
Addended by: Jomarie Longs on: 08/17/2023 04:54 PM   Modules accepted: Orders

## 2023-08-20 ENCOUNTER — Other Ambulatory Visit: Payer: Self-pay | Admitting: Physician Assistant

## 2023-08-20 DIAGNOSIS — I420 Dilated cardiomyopathy: Secondary | ICD-10-CM

## 2023-08-20 DIAGNOSIS — I5023 Acute on chronic systolic (congestive) heart failure: Secondary | ICD-10-CM

## 2023-08-30 ENCOUNTER — Other Ambulatory Visit: Payer: Self-pay

## 2023-08-30 DIAGNOSIS — I420 Dilated cardiomyopathy: Secondary | ICD-10-CM

## 2023-08-30 DIAGNOSIS — I5023 Acute on chronic systolic (congestive) heart failure: Secondary | ICD-10-CM

## 2023-08-30 DIAGNOSIS — Z95810 Presence of automatic (implantable) cardiac defibrillator: Secondary | ICD-10-CM

## 2023-08-30 DIAGNOSIS — I10 Essential (primary) hypertension: Secondary | ICD-10-CM

## 2023-08-30 DIAGNOSIS — E1122 Type 2 diabetes mellitus with diabetic chronic kidney disease: Secondary | ICD-10-CM

## 2023-09-01 NOTE — Progress Notes (Signed)
 Remote ICD transmission.

## 2023-09-13 ENCOUNTER — Ambulatory Visit: Payer: Medicare Other | Attending: Cardiology

## 2023-09-13 DIAGNOSIS — I5022 Chronic systolic (congestive) heart failure: Secondary | ICD-10-CM | POA: Diagnosis not present

## 2023-09-13 DIAGNOSIS — Z95810 Presence of automatic (implantable) cardiac defibrillator: Secondary | ICD-10-CM

## 2023-09-15 ENCOUNTER — Telehealth: Payer: Self-pay

## 2023-09-15 NOTE — Progress Notes (Signed)
 EPIC Encounter for ICM Monitoring  Patient Name: Earl Tadros Sr. is a 83 y.o. male Date: 09/15/2023 Primary Care Physican: Nolene Ebbs Primary Cardiologist: Jens Som Electrophysiologist: Townsend Roger Pacing:  96% 03/26/2022 Weight: 228 lbs 06/10/2022 Office Weight: 238 10/02/2022 Office Weight: 235 lbs 07/07/2023 Weight: 228 lbs 08/11/2023 Weight: 231 lbs   AT/AF Burden <1%            Attempted call to patient and unable to reach.   Transmission results reviewed.    CorVue thoracic impedance suggesting fluid levels fluctuate with days of possible fluid accumulation and days with possible dryness within the last month.   Prescribed:   Furosemide 40 mg 1 tablet (40 mg total) daily.    Spironolactone 25 mg take 0.5 tablet (12.5 mg total) daily Eliquis 5 mg take 1 tablet twice a day.   Labs: 08/16/2023 Creatinine 1.70, BUN 30, Potassium 5.4, Sodium 144, GFR 40 05/18/2023 Creatinine 1.40, BUN 23, Potassium 5.1, Sodium 145, GFR 50 02/09/2023 Creatinine 2.14, BUN 42, Potassium 4.7, Sodium 141, GFR 30 A complete set of results can be found in Results Review.   Recommendations:  Unable to reach.     Follow-up plan: ICM clinic phone appointment on 10/15/2023.   91 day device clinic remote transmission 10/22/2023.     EP/Cardiology Office Visits:  Recall 11/04/2022 with Dr Jens Som (6 month f/u).    No recall but EP appt due with Dr Lalla Brothers (last visit 10/2022).   Copy of ICM check sent to Dr. Lalla Brothers.    3 month ICM trend: 09/13/2023.    12-14 Month ICM trend:     Karie Soda, RN 09/15/2023 9:40 AM

## 2023-09-15 NOTE — Telephone Encounter (Signed)
 Remote ICM transmission received.  Attempted call to patient regarding ICM remote transmission and no answer.

## 2023-09-18 ENCOUNTER — Other Ambulatory Visit: Payer: Self-pay | Admitting: Cardiology

## 2023-09-18 DIAGNOSIS — I480 Paroxysmal atrial fibrillation: Secondary | ICD-10-CM

## 2023-09-20 NOTE — Telephone Encounter (Signed)
 Prescription refill request for Eliquis received. Indication: Afib  Last office visit: 11/11/22 Lanna Poche)  Scr: 1.70 (08/16/23)  Age: 83 Weight: 107kg  Per dosing criteria, current dose not appropriate. Will forward to PharmD team.

## 2023-09-20 NOTE — Telephone Encounter (Signed)
 Per Phillips Hay, PharmD "leave him at the 5mg  for another 3 months. His MD noted that he wasn't drinking enough and his kidneys were trying to hold on to what fluid he had. Let's see what the next labs look like before we make a decision to switch the dose."   Refill sent to pharmacy.

## 2023-10-07 ENCOUNTER — Other Ambulatory Visit: Payer: Self-pay

## 2023-10-07 DIAGNOSIS — E1122 Type 2 diabetes mellitus with diabetic chronic kidney disease: Secondary | ICD-10-CM

## 2023-10-08 MED ORDER — SYNJARDY XR 10-1000 MG PO TB24: 1.0000 | ORAL_TABLET | Freq: Every day | ORAL | 1 refills | Status: DC

## 2023-10-15 ENCOUNTER — Ambulatory Visit: Attending: Cardiology

## 2023-10-15 ENCOUNTER — Other Ambulatory Visit: Payer: Self-pay | Admitting: Student

## 2023-10-15 ENCOUNTER — Telehealth: Payer: Self-pay

## 2023-10-15 DIAGNOSIS — I5022 Chronic systolic (congestive) heart failure: Secondary | ICD-10-CM

## 2023-10-15 DIAGNOSIS — Z95810 Presence of automatic (implantable) cardiac defibrillator: Secondary | ICD-10-CM | POA: Diagnosis not present

## 2023-10-15 DIAGNOSIS — I472 Ventricular tachycardia, unspecified: Secondary | ICD-10-CM

## 2023-10-15 NOTE — Progress Notes (Signed)
 EPIC Encounter for ICM Monitoring  Patient Name: Earl Coran Sr. is a 83 y.o. male Date: 10/15/2023 Primary Care Physican: Kita Perish Primary Cardiologist: Audery Blazing Electrophysiologist: Kasandra Pain Pacing:  96% 03/26/2022 Weight: 228 lbs 06/10/2022 Office Weight: 238 10/02/2022 Office Weight: 235 lbs 07/07/2023 Weight: 228 lbs 08/11/2023 Weight: 231 lbs   AT/AF Burden <1%            Attempted call to patient and unable to reach.  No answer or voice mail option.  Transmission results reviewed.    CorVue thoracic impedance suggesting normal fluid levels within the last month.   Prescribed:   Furosemide  40 mg 1 tablet (40 mg total) daily.    Spironolactone  25 mg take 0.5 tablet (12.5 mg total) daily Eliquis  5 mg take 1 tablet twice a day.   Labs: 08/16/2023 Creatinine 1.70, BUN 30, Potassium 5.4, Sodium 144, GFR 40 05/18/2023 Creatinine 1.40, BUN 23, Potassium 5.1, Sodium 145, GFR 50 02/09/2023 Creatinine 2.14, BUN 42, Potassium 4.7, Sodium 141, GFR 30 A complete set of results can be found in Results Review.   Recommendations:  Unable to reach.     Follow-up plan: ICM clinic phone appointment on 11/15/2023.   91 day device clinic remote transmission 10/22/2023.     EP/Cardiology Office Visits:  Last office visit with Dr Audery Blazing was 05/08/2022 and should have scheduled a 6 month follow up.  Recall 11/04/2022 with Dr Audery Blazing (6 month f/u).  Last EP visit with Michaelle Adolphus, PA was 11/11/2022 and should have returned for 6 month follow up. Message sent to EP scheduler to contact patient for overdue EP appt.     Copy of ICM check sent to Dr. Marven Slimmer.    3 month ICM trend: 10/15/2023.    12-14 Month ICM trend:     Almyra Jain, RN 10/15/2023 7:46 AM

## 2023-10-15 NOTE — Telephone Encounter (Signed)
 Remote ICM transmission received.  Attempted call to patient regarding ICM remote transmission and no answer or voice mail option.

## 2023-10-20 ENCOUNTER — Other Ambulatory Visit: Payer: Self-pay | Admitting: Student

## 2023-10-20 DIAGNOSIS — I472 Ventricular tachycardia, unspecified: Secondary | ICD-10-CM

## 2023-10-21 ENCOUNTER — Other Ambulatory Visit: Payer: Self-pay | Admitting: Student

## 2023-10-21 DIAGNOSIS — I472 Ventricular tachycardia, unspecified: Secondary | ICD-10-CM

## 2023-10-22 ENCOUNTER — Ambulatory Visit (INDEPENDENT_AMBULATORY_CARE_PROVIDER_SITE_OTHER): Payer: Medicare Other

## 2023-10-22 DIAGNOSIS — I255 Ischemic cardiomyopathy: Secondary | ICD-10-CM

## 2023-10-22 LAB — CUP PACEART REMOTE DEVICE CHECK
Battery Remaining Longevity: 56 mo
Battery Remaining Percentage: 59 %
Battery Voltage: 2.96 V
Brady Statistic AP VP Percent: 96 %
Brady Statistic AP VS Percent: 3.7 %
Brady Statistic AS VP Percent: 1 %
Brady Statistic AS VS Percent: 1 %
Brady Statistic RA Percent Paced: 99 %
Date Time Interrogation Session: 20250425020155
HighPow Impedance: 45 Ohm
Implantable Lead Connection Status: 753985
Implantable Lead Connection Status: 753985
Implantable Lead Connection Status: 753985
Implantable Lead Implant Date: 20140117
Implantable Lead Implant Date: 20140117
Implantable Lead Implant Date: 20140117
Implantable Lead Location: 753858
Implantable Lead Location: 753859
Implantable Lead Location: 753860
Implantable Pulse Generator Implant Date: 20220426
Lead Channel Impedance Value: 400 Ohm
Lead Channel Impedance Value: 410 Ohm
Lead Channel Impedance Value: 990 Ohm
Lead Channel Pacing Threshold Amplitude: 0.75 V
Lead Channel Pacing Threshold Amplitude: 1 V
Lead Channel Pacing Threshold Amplitude: 1.125 V
Lead Channel Pacing Threshold Pulse Width: 0.5 ms
Lead Channel Pacing Threshold Pulse Width: 0.5 ms
Lead Channel Pacing Threshold Pulse Width: 0.5 ms
Lead Channel Sensing Intrinsic Amplitude: 12 mV
Lead Channel Sensing Intrinsic Amplitude: 3.9 mV
Lead Channel Setting Pacing Amplitude: 1.5 V
Lead Channel Setting Pacing Amplitude: 1.75 V
Lead Channel Setting Pacing Amplitude: 2.125
Lead Channel Setting Pacing Pulse Width: 0.5 ms
Lead Channel Setting Pacing Pulse Width: 0.5 ms
Lead Channel Setting Sensing Sensitivity: 0.5 mV
Pulse Gen Serial Number: 810027402

## 2023-11-02 ENCOUNTER — Other Ambulatory Visit: Payer: Self-pay | Admitting: Physician Assistant

## 2023-11-02 ENCOUNTER — Other Ambulatory Visit: Payer: Self-pay | Admitting: Student

## 2023-11-02 DIAGNOSIS — I420 Dilated cardiomyopathy: Secondary | ICD-10-CM

## 2023-11-02 DIAGNOSIS — E782 Mixed hyperlipidemia: Secondary | ICD-10-CM

## 2023-11-02 DIAGNOSIS — I10 Essential (primary) hypertension: Secondary | ICD-10-CM

## 2023-11-02 DIAGNOSIS — I5023 Acute on chronic systolic (congestive) heart failure: Secondary | ICD-10-CM

## 2023-11-09 ENCOUNTER — Ambulatory Visit: Admitting: Cardiology

## 2023-11-15 ENCOUNTER — Ambulatory Visit: Attending: Cardiology

## 2023-11-15 DIAGNOSIS — Z95810 Presence of automatic (implantable) cardiac defibrillator: Secondary | ICD-10-CM

## 2023-11-15 DIAGNOSIS — I5022 Chronic systolic (congestive) heart failure: Secondary | ICD-10-CM

## 2023-11-17 ENCOUNTER — Encounter: Payer: Self-pay | Admitting: Physician Assistant

## 2023-11-17 ENCOUNTER — Telehealth: Payer: Self-pay

## 2023-11-17 ENCOUNTER — Ambulatory Visit (INDEPENDENT_AMBULATORY_CARE_PROVIDER_SITE_OTHER): Admitting: Physician Assistant

## 2023-11-17 ENCOUNTER — Ambulatory Visit: Payer: Medicare Other | Admitting: Physician Assistant

## 2023-11-17 VITALS — BP 116/57 | HR 60 | Ht 70.0 in | Wt 234.0 lb

## 2023-11-17 DIAGNOSIS — M130 Polyarthritis, unspecified: Secondary | ICD-10-CM

## 2023-11-17 DIAGNOSIS — E1122 Type 2 diabetes mellitus with diabetic chronic kidney disease: Secondary | ICD-10-CM

## 2023-11-17 DIAGNOSIS — R809 Proteinuria, unspecified: Secondary | ICD-10-CM

## 2023-11-17 DIAGNOSIS — Z95810 Presence of automatic (implantable) cardiac defibrillator: Secondary | ICD-10-CM | POA: Diagnosis not present

## 2023-11-17 DIAGNOSIS — I10 Essential (primary) hypertension: Secondary | ICD-10-CM | POA: Diagnosis not present

## 2023-11-17 DIAGNOSIS — Z7985 Long-term (current) use of injectable non-insulin antidiabetic drugs: Secondary | ICD-10-CM | POA: Diagnosis not present

## 2023-11-17 DIAGNOSIS — I5023 Acute on chronic systolic (congestive) heart failure: Secondary | ICD-10-CM | POA: Diagnosis not present

## 2023-11-17 DIAGNOSIS — I480 Paroxysmal atrial fibrillation: Secondary | ICD-10-CM | POA: Diagnosis not present

## 2023-11-17 DIAGNOSIS — N1832 Chronic kidney disease, stage 3b: Secondary | ICD-10-CM

## 2023-11-17 DIAGNOSIS — I420 Dilated cardiomyopathy: Secondary | ICD-10-CM

## 2023-11-17 LAB — POCT GLYCOSYLATED HEMOGLOBIN (HGB A1C): HbA1c, POC (controlled diabetic range): 6.6 % (ref 0.0–7.0)

## 2023-11-17 LAB — POCT UA - MICROALBUMIN
Creatinine, POC: 50 mg/dL
Microalbumin Ur, POC: 80 mg/L

## 2023-11-17 MED ORDER — TRULICITY 1.5 MG/0.5ML ~~LOC~~ SOAJ: SUBCUTANEOUS | 1 refills | Status: DC

## 2023-11-17 NOTE — Patient Instructions (Addendum)
 Made referral for eye exam  Trial of glucosamine chondrotin and capsacin for joint pain  Arthritis Arthritis is a term that is commonly used to refer to joint pain or joint disease. There are more than 100 types of arthritis. What are the causes? The most common cause of this condition is wear and tear of a joint. Other causes include: Gout. Inflammation of a joint. An infection of a joint. Sprains and other injuries near the joint. A reaction to medicines or drugs, or an allergic reaction. In some cases, the cause may not be known. What are the signs or symptoms? The main symptom of this condition is pain in the joint during movement. Other symptoms include: Redness, swelling, or stiffness at a joint. Warmth coming from the joint. Fever. Overall feeling of illness. How is this diagnosed? This condition may be diagnosed with a physical exam and tests, including: Blood tests. Urine tests. Imaging tests, such as X-rays, an MRI, or a CT scan. Sometimes, fluid is removed from a joint for testing. How is this treated? This condition may be treated with: Treatment of the cause, if it is known. Rest. Raising (elevating) the joint. Applying cold or hot packs to the joint. Medicines to improve symptoms and reduce inflammation. Injections of a steroid, such as cortisone, into the joint to help reduce pain and inflammation. Depending on the cause of your arthritis, you may need to make lifestyle changes to reduce stress on your joint. Changes may include: Exercising more. Losing weight. Follow these instructions at home: Medicines Take over-the-counter and prescription medicines only as told by your health care provider. Do not take aspirin to relieve pain if your health care provider thinks that gout may be causing your pain. Activity Rest your joint if told by your health care provider. Rest is important when your disease is active and your joint feels painful, swollen, or  stiff. Avoid activities that make the pain worse. Balance activity with rest. Exercise your joint regularly with range-of-motion exercises as told by your health care provider. Try doing low-impact exercise, such as: Swimming. Water aerobics. Biking. Walking. Managing pain, stiffness, and swelling     If directed, put ice on the affected joint. To do this: Put ice in a plastic bag. Place a towel between your skin and the bag. Leave the ice on for 20 minutes, 2-3 times a day. Remove the ice if your skin turns bright red. This is very important. If you cannot feel pain, heat, or cold, you have a greater risk of damage to the area. If your joint is swollen, raise (elevate) it above the level of your heart if directed by your health care provider. If your joint feels stiff in the morning, try taking a warm shower. If directed, apply heat to the affected area as often as told by your health care provider. Use the heat source that your health care provider recommends, such as a moist heat pack or a heating pad. To apply heat: Place a towel between your skin and the heat source. Leave the heat on for 20-30 minutes. Remove the heat if your skin turns bright red. This is especially important if you are unable to feel pain, heat, or cold. You have a greater risk of getting burned. General instructions Maintain a healthy weight. Follow instructions from your health care provider for weight control. Do not use any products that contain nicotine or tobacco. These products include cigarettes, chewing tobacco, and vaping devices, such as e-cigarettes. If you  need help quitting, ask your health care provider. Keep all follow-up visits. This is important. Where to find more information [employer]: www.niams.http://www.myers.net/ Contact a health care provider if: The pain gets worse. You have a fever. Get help right away if: You develop severe joint pain, swelling, or redness. Many joints become  painful and swollen. You develop severe back pain. You develop severe weakness in your leg. Summary Arthritis is a term that is commonly used to refer to joint pain or joint disease. There are more than 100 types of arthritis. The most common cause of this condition is wear and tear of a joint. Other causes include gout, inflammation or infection of the joint, sprains, or allergies. Symptoms of this condition include redness, swelling, or stiffness of the joint. Other symptoms include warmth, fever, or feeling ill. This condition is treated with rest, elevation, medicines, and applying cold or hot packs. Follow your health care provider's instructions about medicines, activity, exercises, and other home care treatments. This information is not intended to replace advice given to you by your health care provider. Make sure you discuss any questions you have with your health care provider. Document Revised: 03/25/2021 Document Reviewed: 03/25/2021 Elsevier Patient Education  2024 Elsevier Inc.  Diabetes Mellitus and Nutrition, Adult When you have diabetes, or diabetes mellitus, it is very important to have healthy eating habits because your blood sugar (glucose) levels are greatly affected by what you eat and drink. Eating healthy foods in the right amounts, at about the same times every day, can help you: Manage your blood glucose. Lower your risk of heart disease. Improve your blood pressure. Reach or maintain a healthy weight. What can affect my meal plan? Every person with diabetes is different, and each person has different needs for a meal plan. Your health care provider may recommend that you work with a dietitian to make a meal plan that is best for you. Your meal plan may vary depending on factors such as: The calories you need. The medicines you take. Your weight. Your blood glucose, blood pressure, and cholesterol levels. Your activity level. Other health conditions you have, such  as heart or kidney disease. How do carbohydrates affect me? Carbohydrates, also called carbs, affect your blood glucose level more than any other type of food. Eating carbs raises the amount of glucose in your blood. It is important to know how many carbs you can safely have in each meal. This is different for every person. Your dietitian can help you calculate how many carbs you should have at each meal and for each snack. How does alcohol affect me? Alcohol can cause a decrease in blood glucose (hypoglycemia), especially if you use insulin or take certain diabetes medicines by mouth. Hypoglycemia can be a life-threatening condition. Symptoms of hypoglycemia, such as sleepiness, dizziness, and confusion, are similar to symptoms of having too much alcohol. Do not drink alcohol if: Your health care provider tells you not to drink. You are pregnant, may be pregnant, or are planning to become pregnant. If you drink alcohol: Limit how much you have to: 0-1 drink a day for women. 0-2 drinks a day for men. Know how much alcohol is in your drink. In the [place] one drink equals one 12 oz bottle of beer (355 mL), one 5 oz glass of wine (148 mL), or one 1 oz glass of hard liquor (44 mL). Keep yourself hydrated with water, diet soda, or unsweetened iced tea. Keep in mind that regular  soda, juice, and other mixers may contain a lot of sugar and must be counted as carbs. What are tips for following this plan?  Reading food labels Start by checking the serving size on the Nutrition Facts label of packaged foods and drinks. The number of calories and the amount of carbs, fats, and other nutrients listed on the label are based on one serving of the item. Many items contain more than one serving per package. Check the total grams (g) of carbs in one serving. Check the number of grams of saturated fats and trans fats in one serving. Choose foods that have a low amount or none of these fats. Check the number of  milligrams (mg) of salt (sodium) in one serving. Most people should limit total sodium intake to less than 2,300 mg per day. Always check the nutrition information of foods labeled as "low-fat" or "nonfat." These foods may be higher in added sugar or refined carbs and should be avoided. Talk to your dietitian to identify your daily goals for nutrients listed on the label. Shopping Avoid buying canned, pre-made, or processed foods. These foods tend to be high in fat, sodium, and added sugar. Shop around the outside edge of the grocery store. This is where you will most often find fresh fruits and vegetables, bulk grains, fresh meats, and fresh dairy products. Cooking Use low-heat cooking methods, such as baking, instead of high-heat cooking methods, such as deep frying. Cook using healthy oils, such as olive, canola, or sunflower oil. Avoid cooking with butter, cream, or high-fat meats. Meal planning Eat meals and snacks regularly, preferably at the same times every day. Avoid going long periods of time without eating. Eat foods that are high in fiber, such as fresh fruits, vegetables, beans, and whole grains. Eat 4-6 oz (112-168 g) of lean protein each day, such as lean meat, chicken, fish, eggs, or tofu. One ounce (oz) (28 g) of lean protein is equal to: 1 oz (28 g) of meat, chicken, or fish. 1 egg.  cup (62 g) of tofu. Eat some foods each day that contain healthy fats, such as avocado, nuts, seeds, and fish. What foods should I eat? Fruits Berries. Apples. Oranges. Peaches. Apricots. Plums. Grapes. Mangoes. Papayas. Pomegranates. Kiwi. Cherries. Vegetables Leafy greens, including lettuce, spinach, kale, chard, collard greens, mustard greens, and cabbage. Beets. Cauliflower. Broccoli. Carrots. Green beans. Tomatoes. Peppers. Onions. Cucumbers. Brussels sprouts. Grains Whole grains, such as whole-wheat or whole-grain bread, crackers, tortillas, cereal, and pasta. Unsweetened oatmeal. Quinoa.  Brown or wild rice. Meats and other proteins Seafood. Poultry without skin. Lean cuts of poultry and beef. Tofu. Nuts. Seeds. Dairy Low-fat or fat-free dairy products such as milk, yogurt, and cheese. The items listed above may not be a complete list of foods and beverages you can eat and drink. Contact a dietitian for more information. What foods should I avoid? Fruits Fruits canned with syrup. Vegetables Canned vegetables. Frozen vegetables with butter or cream sauce. Grains Refined white flour and flour products such as bread, pasta, snack foods, and cereals. Avoid all processed foods. Meats and other proteins Fatty cuts of meat. Poultry with skin. Breaded or fried meats. Processed meat. Avoid saturated fats. Dairy Full-fat yogurt, cheese, or milk. Beverages Sweetened drinks, such as soda or iced tea. The items listed above may not be a complete list of foods and beverages you should avoid. Contact a dietitian for more information. Questions to ask a health care provider Do I need to meet with a  certified diabetes care and education specialist? Do I need to meet with a dietitian? What number can I call if I have questions? When are the best times to check my blood glucose? Where to find more information: American Diabetes Association: diabetes.org Academy of Nutrition and Dietetics: eatright.[employer] of Diabetes and Digestive and Kidney Diseases: StageSync.si Association of Diabetes Care & Education Specialists: diabeteseducator.org Summary It is important to have healthy eating habits because your blood sugar (glucose) levels are greatly affected by what you eat and drink. It is important to use alcohol carefully. A healthy meal plan will help you manage your blood glucose and lower your risk of heart disease. Your health care provider may recommend that you work with a dietitian to make a meal plan that is best for you. This information is not intended to replace  advice given to you by your health care provider. Make sure you discuss any questions you have with your health care provider. Document Revised: 01/16/2020 Document Reviewed: 01/17/2020 Elsevier Patient Education  2024 [employer].

## 2023-11-17 NOTE — Progress Notes (Addendum)
 Established Patient Office Visit  Subjective   Patient ID: Earl Schweitzer Sr., male    DOB: [birth year]  Age: 83 y.o. MRN: [id number]  Chief Complaint  Patient presents with   Hypertension   Diabetes    HPI Pt is a 83 yo obese male with T2DM, HTN, CHF, HLD with ICD who presents to the clinic for 3 month follow up.   Pt is doing ok today. Weight is stable. He is compliant with all medications. He denies any significant swelling or SOB. He has been traveling up to [place] recently to see family. He is not checking sugars or BP. He denies any CP or palpitations that have been concerning. He is keeping a low sodium diet.   Followed closely by cardiology. 3 ICD transmissions over past 3 months.   He does have achy joints and wonders if there is anything he can do about it. He gets stiff if he sits still too long.   ROS See HPI.    Objective:     BP (!) 116/57 (BP Location: Left Arm, Patient Position: Sitting, Cuff Size: Large)   Pulse 60   Ht 5\' 10"  (1.778 m)   Wt 234 lb (106.1 kg)   SpO2 94%   BMI 33.58 kg/m  BP Readings from Last 3 Encounters:  11/17/23 (!) 116/57  08/16/23 (!) 110/58  05/12/23 (!) 107/53   Wt Readings from Last 3 Encounters:  11/17/23 234 lb (106.1 kg)  08/16/23 235 lb 12.8 oz (107 kg)  05/12/23 222 lb (100.7 kg)    .Earl White Results for orders placed or performed in visit on 11/17/23  POCT HgB A1C   Collection Time: 11/17/23 11:38 AM  Result Value Ref Range   Hemoglobin A1C     HbA1c POC (<> result, manual entry)     HbA1c, POC (prediabetic range)     HbA1c, POC (controlled diabetic range) 6.6 0.0 - 7.0 %  POCT UA - Microalbumin   Collection Time: 11/17/23 11:38 AM  Result Value Ref Range   Microalbumin Ur, POC 80 mg/L   Creatinine, POC 50 mg/dL   Albumin/Creatinine Ratio, Urine, POC 30-300      Physical Exam Constitutional:      Appearance: Normal appearance. He is obese.  HENT:     Head: Normocephalic.  Cardiovascular:     Rate and Rhythm:  Normal rate and regular rhythm.     Heart sounds: Murmur heard.  Pulmonary:     Effort: Pulmonary effort is normal.     Breath sounds: Normal breath sounds.  Musculoskeletal:     Right lower leg: No edema.     Left lower leg: No edema.  Neurological:     General: No focal deficit present.     Mental Status: He is alert and oriented to person, place, and time.  Psychiatric:        Mood and Affect: Mood normal.      Assessment & Plan:  Earl AasAaron AasNery was seen today for hypertension and diabetes.  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   Discussed arthritis and achy joints Avoid sugar Consider glucosamine chondrotin and capsacin OTC No NSAIDS due to anticoagulation     Return in about 3 months (around 02/17/2024).    Paul Trettin, PA-C

## 2023-11-17 NOTE — Progress Notes (Signed)
 EPIC Encounter for ICM Monitoring  Patient Name: Earl Milo Sr. is a 83 y.o. male Date: 11/17/2023 Primary Care Physican: Kita Perish Primary Cardiologist: Audery Blazing Electrophysiologist: Kasandra Pain Pacing:  96% 03/26/2022 Weight: 228 lbs 06/10/2022 Office Weight: 238 10/02/2022 Office Weight: 235 lbs 07/07/2023 Weight: 228 lbs 08/11/2023 Weight: 231 lbs   AT/AF Burden <1%            Attempted call to patient and unable to reach.   Transmission results reviewed.     CorVue thoracic impedance suggesting normal fluid levels with the exception of possible fluid accumulation from 4/25-5/7.   Prescribed:   Furosemide  40 mg 1 tablet (40 mg total) daily.    Spironolactone  25 mg take 0.5 tablet (12.5 mg total) daily Eliquis  5 mg take 1 tablet twice a day.   Labs: 08/16/2023 Creatinine 1.70, BUN 30, Potassium 5.4, Sodium 144, GFR 40 05/18/2023 Creatinine 1.40, BUN 23, Potassium 5.1, Sodium 145, GFR 50 02/09/2023 Creatinine 2.14, BUN 42, Potassium 4.7, Sodium 141, GFR 30 A complete set of results can be found in Results Review.   Recommendations:  Unable to reach.     Follow-up plan: ICM clinic phone appointment on 01/10/2024.   91 day device clinic remote transmission 01/21/2024.     EP/Cardiology Office Visits:  12/02/2023 with Dr Daneil Dunker.   Last office visit with Dr Audery Blazing was 05/08/2022 and should have scheduled a 6 month follow up.  Recall 11/04/2022 with Dr Audery Blazing (6 month f/u).       Copy of ICM check sent to Dr. Marven Slimmer.    3 month ICM trend: 11/15/2023.    12-14 Month ICM trend:     Almyra Jain, RN 11/17/2023 7:53 AM

## 2023-11-17 NOTE — Telephone Encounter (Signed)
 Remote ICM transmission received.  Attempted call to patient regarding ICM remote transmission and no answer or voice mail option.

## 2023-11-18 ENCOUNTER — Other Ambulatory Visit (HOSPITAL_COMMUNITY): Payer: Self-pay

## 2023-11-18 ENCOUNTER — Telehealth: Payer: Self-pay

## 2023-11-18 NOTE — Telephone Encounter (Signed)
 Pharmacy Patient Advocate Encounter   Received notification from Onbase that prior authorization for Trulicity  1.5mg /0.51ml is required/requested.   Insurance verification completed.   The patient is insured through [facility] .   Per test claim: PA required; PA started via CoverMyMeds. KEY [id number] . Waiting for clinical questions to populate.

## 2023-11-19 DIAGNOSIS — R809 Proteinuria, unspecified: Secondary | ICD-10-CM | POA: Insufficient documentation

## 2023-11-23 NOTE — Telephone Encounter (Signed)
 Clinical questions have been answered and PA submitted. PA currently Pending.

## 2023-11-25 ENCOUNTER — Other Ambulatory Visit: Payer: Self-pay | Admitting: Physician Assistant

## 2023-11-25 DIAGNOSIS — I5023 Acute on chronic systolic (congestive) heart failure: Secondary | ICD-10-CM

## 2023-11-25 DIAGNOSIS — I420 Dilated cardiomyopathy: Secondary | ICD-10-CM

## 2023-11-26 ENCOUNTER — Other Ambulatory Visit (HOSPITAL_COMMUNITY): Payer: Self-pay

## 2023-11-26 NOTE — Telephone Encounter (Signed)
 Pharmacy Patient Advocate Encounter  Received notification from [employer] that Prior Authorization for Trulicity  1.5mg /0.61ml has been APPROVED from 11/23/23 to 06/28/24. Ran test claim, Copay is $0. This test claim was processed through [facility]- copay amounts may vary at other pharmacies due to pharmacy/plan contracts, or as the patient moves through the different stages of their insurance plan.   PA #/Case ID/Reference #: [id number]  Left a message at [facility] to notify of the approval.

## 2023-11-29 NOTE — Telephone Encounter (Signed)
 Attempted to contact the patient. No answer. Unable to leave a msg - there is no vm available.

## 2023-11-29 NOTE — Progress Notes (Signed)
 Remote ICD transmission.

## 2023-11-30 ENCOUNTER — Other Ambulatory Visit: Payer: Self-pay | Admitting: Physician Assistant

## 2023-11-30 DIAGNOSIS — I5023 Acute on chronic systolic (congestive) heart failure: Secondary | ICD-10-CM

## 2023-11-30 DIAGNOSIS — I420 Dilated cardiomyopathy: Secondary | ICD-10-CM

## 2023-12-02 ENCOUNTER — Ambulatory Visit: Attending: Cardiology | Admitting: Cardiology

## 2023-12-02 ENCOUNTER — Other Ambulatory Visit: Payer: Self-pay | Admitting: Cardiology

## 2023-12-02 DIAGNOSIS — I420 Dilated cardiomyopathy: Secondary | ICD-10-CM

## 2023-12-07 ENCOUNTER — Other Ambulatory Visit: Payer: Self-pay | Admitting: Cardiology

## 2023-12-07 DIAGNOSIS — I420 Dilated cardiomyopathy: Secondary | ICD-10-CM

## 2023-12-20 NOTE — Progress Notes (Deleted)
 Electrophysiology Office Note:   Date:  12/20/2023  ID:  Earl Pinal Sr., DOB 14-Jul-1940, MRN [id number]  Primary Cardiologist: Redell Shallow, MD Electrophysiologist: Fonda Kitty, MD  {Click to update primary MD,subspecialty MD or APP then REFRESH:1}    History of Present Illness:   Earl Rutigliano Sr. is a 83 y.o. male with h/o PAF, presumed CAD, Chronic systolic CHF, VT, ICD, HTN, HLD, and h/o AS who is being seen today for follow up ICD management.   bSeen 10/02/2022 with ICD shock. Noted to be off his amiodarone . Restarted at that visit.   Discussed the use of AI scribe software for clinical note transcription with the patient, who gave verbal consent to proceed.  History of Present Illness     Review of systems complete and found to be negative unless listed in HPI.   EP Information / Studies Reviewed:    {EKGtoday:28818}      Echo 01/27/22:  1. Left ventricular ejection fraction, by estimation, is 20 to 25%. The  left ventricle has severely decreased function. The left ventricle  demonstrates global hypokinesis. The left ventricular internal cavity size  was moderately dilated. There is mild  concentric left ventricular hypertrophy. Left ventricular diastolic  parameters are consistent with Grade I diastolic dysfunction (impaired  relaxation). Elevated left ventricular end-diastolic pressure.   2. Right ventricular systolic function is normal. The right ventricular  size is normal.   3. Left atrial size was moderately dilated.   4. Right atrial size was moderately dilated.   5. The mitral valve is normal in structure. No evidence of mitral valve  regurgitation. No evidence of mitral stenosis.   6. The aortic valve is tricuspid. There is mild calcification of the  aortic valve. There is mild thickening of the aortic valve. Aortic valve  regurgitation is not visualized. Mild to moderate aortic valve stenosis.   7. Aortic dilatation noted. There is borderline dilatation  of the aortic  root, measuring 36 mm. There is mild dilatation of the ascending aorta,  measuring 39 mm.   8. The inferior vena cava is normal in size with greater than 50%  respiratory variability, suggesting right atrial pressure of 3 mmHg.    Risk Assessment/Calculations:    CHA2DS2-VASc Score =    {Click here to calculate score.  REFRESH note before signing. :1} This indicates a  % annual risk of stroke. The patient's score is based upon:      No BP recorded.  {Refresh Note OR Click here to enter BP  :1}***        Physical Exam:   VS:  There were no vitals taken for this visit.   Wt Readings from Last 3 Encounters:  11/17/23 234 lb (106.1 kg)  08/16/23 235 lb 12.8 oz (107 kg)  05/12/23 222 lb (100.7 kg)     GEN: Well nourished, well developed in no acute distress NECK: No JVD CARDIAC: {EPRHYTHM:28826}, no murmurs, rubs, gallops RESPIRATORY:  Clear to auscultation without rales, wheezing or rhonchi  ABDOMEN: Soft, non-distended EXTREMITIES:  No edema; No deformity   ASSESSMENT AND PLAN:    Chronic systolic dysfunction s/p Abbott CRT-D  Echo 01/2022 LVEF 20-25%. Consider updating if has more VT or worsening symptoms, but for now would not change therapy.  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   Prior shocks in 8/23 and 10/23, and 09/2022 No further on brief check today.  Continue coreg  25 mg BID. If recurs, consider switching to max dose Toprol.  Continue amiodarone  200 mg daily. Stressed importance of compliance.  Restart amiodarone  200 mg BID x 2 weeks, then 200 mg daily.  Surveillance labs today.    Paroxysmal AF/AFL Continue eliquis  5 mg BID for CHA2DS2/VASc of at least 5.  Amiodarone  as above.    Cardiomyopathy ? CAD Previous nuclear study showed infarct but  no ischemia. He refused catheterization. If VT recurs on amiodarone , would recommend heart cath.    Follow up with {EPMDS:28135::EP Team} {EPFOLLOW LE:71826}  Signed, Fonda Kitty, MD

## 2023-12-21 ENCOUNTER — Encounter: Admitting: Cardiology

## 2023-12-23 ENCOUNTER — Ambulatory Visit: Attending: Cardiology | Admitting: Cardiology

## 2023-12-23 ENCOUNTER — Encounter: Payer: Self-pay | Admitting: Cardiology

## 2023-12-23 ENCOUNTER — Other Ambulatory Visit: Payer: Self-pay | Admitting: Cardiology

## 2023-12-23 VITALS — BP 96/56 | HR 64 | Ht 70.0 in | Wt 232.0 lb

## 2023-12-23 DIAGNOSIS — I5022 Chronic systolic (congestive) heart failure: Secondary | ICD-10-CM | POA: Insufficient documentation

## 2023-12-23 DIAGNOSIS — Z79899 Other long term (current) drug therapy: Secondary | ICD-10-CM | POA: Diagnosis present

## 2023-12-23 DIAGNOSIS — I480 Paroxysmal atrial fibrillation: Secondary | ICD-10-CM | POA: Diagnosis present

## 2023-12-23 DIAGNOSIS — I472 Ventricular tachycardia, unspecified: Secondary | ICD-10-CM | POA: Insufficient documentation

## 2023-12-23 DIAGNOSIS — D6869 Other thrombophilia: Secondary | ICD-10-CM | POA: Diagnosis not present

## 2023-12-23 DIAGNOSIS — Z95810 Presence of automatic (implantable) cardiac defibrillator: Secondary | ICD-10-CM | POA: Insufficient documentation

## 2023-12-23 LAB — CUP PACEART INCLINIC DEVICE CHECK
Battery Remaining Longevity: 51 mo
Brady Statistic RA Percent Paced: 99.6 %
Brady Statistic RV Percent Paced: 96 %
Date Time Interrogation Session: 20250626163618
HighPow Impedance: 43.7685
HighPow Impedance: 44 Ohm
Implantable Lead Connection Status: 753985
Implantable Lead Connection Status: 753985
Implantable Lead Connection Status: 753985
Implantable Lead Implant Date: 20140117
Implantable Lead Implant Date: 20140117
Implantable Lead Implant Date: 20140117
Implantable Lead Location: 753858
Implantable Lead Location: 753859
Implantable Lead Location: 753860
Implantable Pulse Generator Implant Date: 20220426
Lead Channel Impedance Value: 375 Ohm
Lead Channel Impedance Value: 400 Ohm
Lead Channel Impedance Value: 950 Ohm
Lead Channel Pacing Threshold Amplitude: 0.75 V
Lead Channel Pacing Threshold Amplitude: 0.75 V
Lead Channel Pacing Threshold Amplitude: 1.125 V
Lead Channel Pacing Threshold Amplitude: 1.25 V
Lead Channel Pacing Threshold Amplitude: 1.25 V
Lead Channel Pacing Threshold Amplitude: 3 V
Lead Channel Pacing Threshold Pulse Width: 0.5 ms
Lead Channel Pacing Threshold Pulse Width: 0.5 ms
Lead Channel Pacing Threshold Pulse Width: 0.5 ms
Lead Channel Pacing Threshold Pulse Width: 0.5 ms
Lead Channel Pacing Threshold Pulse Width: 0.5 ms
Lead Channel Pacing Threshold Pulse Width: 0.5 ms
Lead Channel Sensing Intrinsic Amplitude: 12 mV
Lead Channel Sensing Intrinsic Amplitude: 3.9 mV
Lead Channel Setting Pacing Amplitude: 1.625
Lead Channel Setting Pacing Amplitude: 1.75 V
Lead Channel Setting Pacing Amplitude: 2.125
Lead Channel Setting Pacing Pulse Width: 0.5 ms
Lead Channel Setting Pacing Pulse Width: 0.5 ms
Lead Channel Setting Sensing Sensitivity: 0.5 mV
Pulse Gen Serial Number: 810027402

## 2023-12-23 MED ORDER — CARVEDILOL 12.5 MG PO TABS: 12.5000 mg | ORAL_TABLET | Freq: Two times a day (BID) | ORAL | 3 refills | Status: AC

## 2023-12-23 NOTE — Progress Notes (Signed)
 Electrophysiology Office Note:   Date:  12/24/2023  ID:  Earl Pinal Sr., DOB Apr 29, 1941, MRN [id number]  Primary Cardiologist: Redell Shallow, MD Electrophysiologist: Fonda Kitty, MD      History of Present Illness:   Earl Titsworth Sr. is a 83 y.o. male with h/o PAF, presumed CAD, Chronic systolic CHF, VT, ICD, HTN, HLD, and h/o AS who is being seen today for follow up ICD management.    Discussed the use of AI scribe software for clinical note transcription with the patient, who gave verbal consent to proceed.  History of Present Illness Seen 10/02/2022 with ICD shock. Noted to be off his amiodarone . Restarted at that visit.  No ventricular arrhythmias or device therapies since the last check.  He reports a significant decrease in energy levels, describing variability in his energy. Some days he feels capable of high activity, while on others, he feels extremely fatigued. He does not monitor his blood pressure at home, but it is noted to be low during the visit. He is taking carvedilol  twice daily, which may contribute to his low energy levels.  No swelling in the legs is reported, and his breathing is described as manageable, although he experiences lightheadedness when bending over, a condition he has had for years.  His family history includes longevity, with his mother living to 79 and his father to 34. He has a background as an athlete and bodybuilder but denies any use of steroids or smoking. He inquires about the potential impact of his athletic past on his current heart condition.  Review of systems complete and found to be negative unless listed in HPI.   EP Information / Studies Reviewed:    EKG is ordered today. Personal review as below.  EKG Interpretation Date/Time:  Thursday December 23 2023 15:52:23 EDT Ventricular Rate:  64 PR Interval:  90 QRS Duration:  168 QT Interval:  510 QTC Calculation: 526 R Axis:   -80  Text Interpretation: AV dual-paced rhythm When  compared with ECG of 22-Oct-2020 15:25, No significant change was found Confirmed by Kitty Fonda [phone]) on 12/24/2023 5:50:14 PM   Echo 01/27/22:  1. Left ventricular ejection fraction, by estimation, is 20 to 25%. The  left ventricle has severely decreased function. The left ventricle  demonstrates global hypokinesis. The left ventricular internal cavity size  was moderately dilated. There is mild  concentric left ventricular hypertrophy. Left ventricular diastolic  parameters are consistent with Grade I diastolic dysfunction (impaired  relaxation). Elevated left ventricular end-diastolic pressure.   2. Right ventricular systolic function is normal. The right ventricular  size is normal.   3. Left atrial size was moderately dilated.   4. Right atrial size was moderately dilated.   5. The mitral valve is normal in structure. No evidence of mitral valve  regurgitation. No evidence of mitral stenosis.   6. The aortic valve is tricuspid. There is mild calcification of the  aortic valve. There is mild thickening of the aortic valve. Aortic valve  regurgitation is not visualized. Mild to moderate aortic valve stenosis.   7. Aortic dilatation noted. There is borderline dilatation of the aortic  root, measuring 36 mm. There is mild dilatation of the ascending aorta,  measuring 39 mm.   8. The inferior vena cava is normal in size with greater than 50%  respiratory variability, suggesting right atrial pressure of 3 mmHg.          Physical Exam:   VS:  BP (!) 96/56   Pulse  64   Ht 5' 10 (1.778 m)   Wt 232 lb (105.2 kg)   SpO2 90%   BMI 33.29 kg/m    Wt Readings from Last 3 Encounters:  12/23/23 232 lb (105.2 kg)  11/17/23 234 lb (106.1 kg)  08/16/23 235 lb 12.8 oz (107 kg)     GEN: Well nourished, well developed in no acute distress NECK: No JVD CARDIAC: Normal rate, regular rhythm.  Left chest ICD pocket well-healed. RESPIRATORY:  Clear to auscultation without rales, wheezing or  rhonchi  ABDOMEN: Soft, non-distended EXTREMITIES:  No edema; No deformity   ASSESSMENT AND PLAN:    Chronic systolic heart failure s/p Abbott CRT-D: Well compensated on exam. - In-clinic device interrogation was performed today.  Appropriate device function and stable lead parameters.  Presenting rhythm is atrial sensed and biventricular paced.  No ventricular arrhythmias or device therapies.  BiV pacing 96%.  Estimated longevity 4 and half years.  Known atrial lead noise.  No programming changes made. - Continue GDMT regimen of carvedilol  12.5 mg twice daily, Entresto  97-103 mg twice daily, spironolactone  25 mg daily, empagliflozin  10 mg daily. - Will refer patient to establish care with our heart failure team for management of his heart failure and medication regimen.   History of VT/VF with ICD shock High risk medication use - Amiodarone .   -No recent VT/VF episodes on device check. -Continue amiodarone  200 mg daily. -Continue carvedilol  12.5 mg twice daily. -Continue remote monitoring. - Repeat LFTs and TSH.  Paroxysmal AF/AFL Secondary hypercoagulable state due to atrial fibrillation High risk medication use - Amiodarone .   -Continue eliquis  5 mg BID for CHA2DS2/VASc of at least 5.  -Amiodarone  and metoprolol as above. - Repeat LFTs and TSH.  Follow up with Dr. Kennyth in 6 months  Signed, Fonda Kennyth, MD

## 2023-12-23 NOTE — Patient Instructions (Addendum)
 Medication Instructions:  Your physician has recommended you make the following change in your medication:  1) DECREASE carvedilol  to 12.5 mg twice daily  *If you need a refill on your cardiac medications before your next appointment, please call your pharmacy*  Follow-Up: At [facility], you and your health needs are our priority.  As part of our continuing mission to provide you with exceptional heart care, our providers are all part of one team.  This team includes your primary Cardiologist (physician) and Advanced Practice Providers or APPs (Physician Assistants and Nurse Practitioners) who all work together to provide you with the care you need, when you need it.  Your next appointment:   6 months  Provider:   You will see one of the following Advanced Practice Providers on your designated Care Team:   Charlies Arthur, PA-C Michael Andy Tillery, PA-C Suzann Riddle, NP Daphne Barrack, NP

## 2023-12-23 NOTE — Telephone Encounter (Signed)
 Prescription refill request for Eliquis  received.  Indication: afib  Last office visit: Tillery, 11/11/2022 Scr: 1.7 08/16/2023 Age: 83 yo  Weight:  106.1 kg    Pt is scheduled to see Dr. Kennyth today. Per dosing criteria he qualifies for a dose decrease

## 2023-12-26 ENCOUNTER — Ambulatory Visit: Payer: Self-pay | Admitting: Cardiology

## 2023-12-27 ENCOUNTER — Telehealth: Payer: Self-pay

## 2023-12-27 NOTE — Telephone Encounter (Signed)
 Attempted to call patient. Unable to leave voicemail.

## 2023-12-27 NOTE — Telephone Encounter (Signed)
 Spoke with the patient and advised that he needs to get lab work. Patient verbalized understanding.

## 2023-12-27 NOTE — Telephone Encounter (Signed)
 Per Chris Pavero, PharmD, dose should be decreased to Eliquis  2.5mg  BID. Called pt and made him aware of dose change. Refill sent.

## 2023-12-27 NOTE — Telephone Encounter (Signed)
-----   Message from Fonda Kitty sent at 12/24/2023  6:00 PM EDT ----- I ordered TSH and LFTs for amiodarone  monitoring. Can we inform patient to get labs drawn at his convenience?  Thanks, [employer]

## 2023-12-28 ENCOUNTER — Telehealth: Payer: Self-pay

## 2023-12-28 NOTE — Telephone Encounter (Signed)
 Alert received from [employer]:  Alert remote transmission:  Atrial Capture > Threshold A cap at high output - route to triage  Autocapture is on.  Pt with history of high output d/t infrequent noise on atrial lead.  Will bring Pt in to hard program atrial output.

## 2023-12-28 NOTE — Telephone Encounter (Signed)
 I called and spoke with the patient to advise him of the needed programming change at his convenience. Per the patient, he will come to the [place] office tomorrow to have this lab work done for Dr. Kennyth and come up to the 5th floor for a Device Clinic appointment.   The patient voices understanding and is agreeable.

## 2023-12-29 ENCOUNTER — Ambulatory Visit: Attending: Cardiology

## 2023-12-29 DIAGNOSIS — Z79899 Other long term (current) drug therapy: Secondary | ICD-10-CM | POA: Diagnosis not present

## 2023-12-29 DIAGNOSIS — I5022 Chronic systolic (congestive) heart failure: Secondary | ICD-10-CM

## 2023-12-29 LAB — CUP PACEART INCLINIC DEVICE CHECK
Date Time Interrogation Session: 20250702105903
Implantable Lead Connection Status: 753985
Implantable Lead Connection Status: 753985
Implantable Lead Connection Status: 753985
Implantable Lead Implant Date: 20140117
Implantable Lead Implant Date: 20140117
Implantable Lead Implant Date: 20140117
Implantable Lead Location: 753858
Implantable Lead Location: 753859
Implantable Lead Location: 753860
Implantable Pulse Generator Implant Date: 20220426
Pulse Gen Serial Number: 810027402

## 2023-12-29 NOTE — Progress Notes (Signed)
 Patient visit for Atrial lead at high output. Adaptive turned off and Monitor turned on. Atrial lead threshold test performed. Reprogrammed Atrial lead 2V @ 0.72ms.

## 2023-12-29 NOTE — Patient Instructions (Signed)
 Follow up as scheduled.

## 2023-12-30 LAB — TSH: TSH: 2.15 u[IU]/mL (ref 0.450–4.500)

## 2023-12-30 LAB — HEPATIC FUNCTION PANEL
ALT: 27 IU/L (ref 0–44)
AST: 24 IU/L (ref 0–40)
Albumin: 4.3 g/dL (ref 3.7–4.7)
Alkaline Phosphatase: 71 IU/L (ref 44–121)
Bilirubin Total: 0.6 mg/dL (ref 0.0–1.2)
Bilirubin, Direct: 0.22 mg/dL (ref 0.00–0.40)
Total Protein: 6.8 g/dL (ref 6.0–8.5)

## 2024-01-10 ENCOUNTER — Ambulatory Visit: Attending: Cardiology

## 2024-01-10 DIAGNOSIS — Z95810 Presence of automatic (implantable) cardiac defibrillator: Secondary | ICD-10-CM | POA: Diagnosis not present

## 2024-01-10 DIAGNOSIS — I5022 Chronic systolic (congestive) heart failure: Secondary | ICD-10-CM

## 2024-01-14 NOTE — Progress Notes (Signed)
 EPIC Encounter for ICM Monitoring  Patient Name: Earl Stiehl Sr. is a 83 y.o. male Date: 01/14/2024 Primary Care Physican: Antoniette Vermell LITTIE DEVONNA Primary Cardiologist: Pietro Electrophysiologist: Cindie Pore Pacing: >99% 03/26/2022 Weight: 228 lbs 06/10/2022 Office Weight: 238 10/02/2022 Office Weight: 235 lbs 07/07/2023 Weight: 228 lbs 08/11/2023 Weight: 231 lbs 12/23/2023 Office Weight: 232 lbs   AT/AF Burden <1%            Transmission results reviewed.     CorVue thoracic impedance suggesting normal fluid levels with the exception of possible fluid accumulation from 6/28-7/4 and 7/6-7/12.   Prescribed:   Furosemide  40 mg 1 tablet (40 mg total) daily.    Spironolactone  25 mg take 0.5 tablet (12.5 mg total) daily Eliquis  5 mg take 1 tablet twice a day.   Labs: 08/16/2023 Creatinine 1.70, BUN 30, Potassium 5.4, Sodium 144, GFR 40 05/18/2023 Creatinine 1.40, BUN 23, Potassium 5.1, Sodium 145, GFR 50 02/09/2023 Creatinine 2.14, BUN 42, Potassium 4.7, Sodium 141, GFR 30 A complete set of results can be found in Results Review.   Recommendations:  No changes   Follow-up plan: ICM clinic phone appointment on 02/14/2024.   91 day device clinic remote transmission 01/21/2024.     EP/Cardiology Office Visits:  Recall 06/20/2024 with Dr Cindie.   Last office visit with Dr Pietro was 05/08/2022 and should have scheduled a 6 month follow up.  Recall 11/04/2022 with Dr Pietro (6 month f/u).       Copy of ICM check sent to Dr. Cindie.     3 month ICM trend: 01/10/2024.    12-14 Month ICM trend:     Earl GORMAN Garner, RN 01/14/2024 4:24 PM

## 2024-01-15 ENCOUNTER — Ambulatory Visit: Payer: Self-pay | Admitting: Cardiology

## 2024-01-20 ENCOUNTER — Other Ambulatory Visit: Payer: Self-pay | Admitting: Student

## 2024-01-20 ENCOUNTER — Other Ambulatory Visit: Payer: Self-pay | Admitting: Cardiology

## 2024-01-20 ENCOUNTER — Other Ambulatory Visit: Payer: Self-pay | Admitting: Physician Assistant

## 2024-01-20 DIAGNOSIS — E782 Mixed hyperlipidemia: Secondary | ICD-10-CM

## 2024-01-20 DIAGNOSIS — I472 Ventricular tachycardia, unspecified: Secondary | ICD-10-CM

## 2024-01-21 ENCOUNTER — Ambulatory Visit: Payer: Medicare Other

## 2024-01-21 DIAGNOSIS — I255 Ischemic cardiomyopathy: Secondary | ICD-10-CM | POA: Diagnosis not present

## 2024-01-21 LAB — CUP PACEART REMOTE DEVICE CHECK
Battery Remaining Longevity: 50 mo
Battery Remaining Percentage: 56 %
Battery Voltage: 2.96 V
Brady Statistic AP VP Percent: 91 %
Brady Statistic AP VS Percent: 8.7 %
Brady Statistic AS VP Percent: 1 %
Brady Statistic AS VS Percent: 1 %
Brady Statistic RA Percent Paced: 99 %
Date Time Interrogation Session: 20250725020018
HighPow Impedance: 44 Ohm
Implantable Lead Connection Status: 753985
Implantable Lead Connection Status: 753985
Implantable Lead Connection Status: 753985
Implantable Lead Implant Date: 20140117
Implantable Lead Implant Date: 20140117
Implantable Lead Implant Date: 20140117
Implantable Lead Location: 753858
Implantable Lead Location: 753859
Implantable Lead Location: 753860
Implantable Pulse Generator Implant Date: 20220426
Lead Channel Impedance Value: 380 Ohm
Lead Channel Impedance Value: 390 Ohm
Lead Channel Impedance Value: 950 Ohm
Lead Channel Pacing Threshold Amplitude: 0.75 V
Lead Channel Pacing Threshold Amplitude: 1 V
Lead Channel Pacing Threshold Amplitude: 1.125 V
Lead Channel Pacing Threshold Pulse Width: 0.5 ms
Lead Channel Pacing Threshold Pulse Width: 0.5 ms
Lead Channel Pacing Threshold Pulse Width: 0.5 ms
Lead Channel Sensing Intrinsic Amplitude: 11.1 mV
Lead Channel Sensing Intrinsic Amplitude: 4.5 mV
Lead Channel Setting Pacing Amplitude: 1.5 V
Lead Channel Setting Pacing Amplitude: 2 V
Lead Channel Setting Pacing Amplitude: 2.125
Lead Channel Setting Pacing Pulse Width: 0.5 ms
Lead Channel Setting Pacing Pulse Width: 0.5 ms
Lead Channel Setting Sensing Sensitivity: 0.5 mV
Pulse Gen Serial Number: 810027402

## 2024-01-23 ENCOUNTER — Ambulatory Visit: Payer: Self-pay | Admitting: Cardiology

## 2024-02-11 ENCOUNTER — Other Ambulatory Visit: Payer: Self-pay | Admitting: Physician Assistant

## 2024-02-11 DIAGNOSIS — E1122 Type 2 diabetes mellitus with diabetic chronic kidney disease: Secondary | ICD-10-CM

## 2024-02-14 ENCOUNTER — Ambulatory Visit: Attending: Cardiology

## 2024-02-14 DIAGNOSIS — I5022 Chronic systolic (congestive) heart failure: Secondary | ICD-10-CM | POA: Diagnosis not present

## 2024-02-14 DIAGNOSIS — Z95810 Presence of automatic (implantable) cardiac defibrillator: Secondary | ICD-10-CM

## 2024-02-18 ENCOUNTER — Ambulatory Visit: Admitting: Physician Assistant

## 2024-02-18 NOTE — Progress Notes (Signed)
 EPIC Encounter for ICM Monitoring  Patient Name: Earl Grell Sr. is a 83 y.o. male Date: 02/18/2024 Primary Care Physican: Antoniette Vermell LITTIE DEVONNA Primary Cardiologist: Pietro Electrophysiologist: Cindie Pore Pacing: >99% 03/26/2022 Weight: 228 lbs 06/10/2022 Office Weight: 238 10/02/2022 Office Weight: 235 lbs 07/07/2023 Weight: 228 lbs 08/11/2023 Weight: 231 lbs 12/23/2023 Office Weight: 232 lbs   AT/AF Burden <1%            Transmission results reviewed.     CorVue thoracic impedance suggesting normal fluid levels within the last month.   Prescribed:   Furosemide  40 mg 1 tablet (40 mg total) daily.    Spironolactone  25 mg take 0.5 tablet (12.5 mg total) daily Eliquis  5 mg take 1 tablet twice a day.   Labs: 08/16/2023 Creatinine 1.70, BUN 30, Potassium 5.4, Sodium 144, GFR 40 05/18/2023 Creatinine 1.40, BUN 23, Potassium 5.1, Sodium 145, GFR 50 02/09/2023 Creatinine 2.14, BUN 42, Potassium 4.7, Sodium 141, GFR 30 A complete set of results can be found in Results Review.   Recommendations:  No changes   Follow-up plan: ICM clinic phone appointment on 03/20/2024.   91 day device clinic remote transmission 04/21/2024.     EP/Cardiology Office Visits:  Recall 06/20/2024 with Dr Cindie.   Last office visit with Dr Pietro was 05/08/2022 and should have scheduled a 6 month follow up.  Recall 11/04/2022 with Dr Pietro (6 month f/u).       Copy of ICM check sent to Dr. Cindie.      3 month ICM trend: 02/14/2024.    12-14 Month ICM trend:     Mitzie GORMAN Garner, RN 02/18/2024 5:10 PM

## 2024-03-01 ENCOUNTER — Other Ambulatory Visit: Payer: Self-pay | Admitting: Physician Assistant

## 2024-03-01 DIAGNOSIS — I5023 Acute on chronic systolic (congestive) heart failure: Secondary | ICD-10-CM

## 2024-03-01 DIAGNOSIS — I420 Dilated cardiomyopathy: Secondary | ICD-10-CM

## 2024-03-06 ENCOUNTER — Other Ambulatory Visit: Payer: Self-pay | Admitting: Physician Assistant

## 2024-03-06 DIAGNOSIS — I5023 Acute on chronic systolic (congestive) heart failure: Secondary | ICD-10-CM

## 2024-03-06 DIAGNOSIS — I420 Dilated cardiomyopathy: Secondary | ICD-10-CM

## 2024-03-13 ENCOUNTER — Encounter: Payer: Self-pay | Admitting: Physician Assistant

## 2024-03-13 ENCOUNTER — Ambulatory Visit (INDEPENDENT_AMBULATORY_CARE_PROVIDER_SITE_OTHER): Admitting: Physician Assistant

## 2024-03-13 VITALS — BP 130/70 | HR 63 | Ht 70.0 in | Wt 225.0 lb

## 2024-03-13 DIAGNOSIS — Z95810 Presence of automatic (implantable) cardiac defibrillator: Secondary | ICD-10-CM | POA: Diagnosis not present

## 2024-03-13 DIAGNOSIS — I5023 Acute on chronic systolic (congestive) heart failure: Secondary | ICD-10-CM

## 2024-03-13 DIAGNOSIS — E1122 Type 2 diabetes mellitus with diabetic chronic kidney disease: Secondary | ICD-10-CM | POA: Diagnosis not present

## 2024-03-13 DIAGNOSIS — N1832 Chronic kidney disease, stage 3b: Secondary | ICD-10-CM

## 2024-03-13 DIAGNOSIS — Z23 Encounter for immunization: Secondary | ICD-10-CM

## 2024-03-13 DIAGNOSIS — I420 Dilated cardiomyopathy: Secondary | ICD-10-CM | POA: Diagnosis not present

## 2024-03-13 DIAGNOSIS — I10 Essential (primary) hypertension: Secondary | ICD-10-CM

## 2024-03-13 DIAGNOSIS — Z7985 Long-term (current) use of injectable non-insulin antidiabetic drugs: Secondary | ICD-10-CM

## 2024-03-13 DIAGNOSIS — E782 Mixed hyperlipidemia: Secondary | ICD-10-CM

## 2024-03-13 LAB — POCT GLYCOSYLATED HEMOGLOBIN (HGB A1C): Hemoglobin A1C: 6.1 % — AB (ref 4.0–5.6)

## 2024-03-13 MED ORDER — ATORVASTATIN CALCIUM 80 MG PO TABS: 80.0000 mg | ORAL_TABLET | Freq: Every day | ORAL | 0 refills | Status: DC

## 2024-03-13 MED ORDER — FUROSEMIDE 40 MG PO TABS: 40.0000 mg | ORAL_TABLET | Freq: Every day | ORAL | 0 refills | Status: AC

## 2024-03-13 MED ORDER — SPIRONOLACTONE 25 MG PO TABS: 25.0000 mg | ORAL_TABLET | Freq: Every day | ORAL | 1 refills | Status: AC

## 2024-03-13 NOTE — Progress Notes (Signed)
 Established Patient Office Visit  Subjective   Patient ID: Earl Asman Sr., male    DOB: 1940/12/31  Age: 83 y.o. MRN: [id number]  Chief Complaint  Patient presents with   Medical Management of Chronic Issues    Med refill     HPI Pt is a 83 yo obese male with T2DM, HTN, CHF, HLD with ICD who presents to the clinic for follow up and medication refills.   Pt is doing well. He needs medication refilled. He reports pharmacy would not feel spironolactone  and has been out of it for a few days. He has been working on weight loss with more walking and keeping a better diet. He does not add any extra salt to his diet. He has lost 9lbs since last visit. He is not checking his sugars and denies any hypoglycemic events. He has not had ICD events. He denies any CP, palpitations, worsening SOB.   He sees cardiology regularly.     ROS See HPI.    Objective:     BP 130/70 (BP Location: Left Arm, Patient Position: Sitting, Cuff Size: Normal)   Pulse 63   Ht 5' 10 (1.778 m)   Wt 225 lb (102.1 kg)   SpO2 99%   BMI 32.28 kg/m  BP Readings from Last 3 Encounters:  03/13/24 130/70  12/23/23 (!) 96/56  11/17/23 (!) 116/57   Wt Readings from Last 3 Encounters:  03/13/24 225 lb (102.1 kg)  12/23/23 232 lb (105.2 kg)  11/17/23 234 lb (106.1 kg)      Physical Exam Constitutional:      Appearance: Normal appearance. He is obese.  HENT:     Head: Normocephalic.  Neck:     Vascular: No carotid bruit.  Cardiovascular:     Rate and Rhythm: Normal rate and regular rhythm.     Heart sounds: Murmur heard.  Pulmonary:     Effort: Pulmonary effort is normal.     Breath sounds: Normal breath sounds.  Musculoskeletal:     Right lower leg: No edema.     Left lower leg: No edema.  Neurological:     General: No focal deficit present.     Mental Status: He is alert and oriented to person, place, and time.  Psychiatric:        Mood and Affect: Mood normal.      .. Lab Results   Component Value Date   HGBA1C 6.1 (A) 03/13/2024      Assessment & Plan:  Earl White was seen today for medical management of chronic issues.  Diagnoses and all orders for this visit:  Type 2 diabetes mellitus with stage 3b chronic kidney disease, 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HCC) -     POCT HgB A1C -     CMP14+EGFR  Congestive dilated cardiomyopathy (HCC) -     spironolactone  (ALDACTONE ) 25 MG tablet; Take 1 tablet (25 mg total) by mouth daily. -     furosemide  (LASIX ) 40 MG tablet; Take 1 tablet (40 mg total) by mouth daily.  Essential hypertension, benign -     furosemide  (LASIX ) 40 MG tablet; Take 1 tablet (40 mg total) by mouth daily. -     CMP14+EGFR  Systolic dysfunction with acute on chronic heart failure (HCC) -     spironolactone  (ALDACTONE ) 25 MG tablet; Take 1 tablet (25 mg total) by mouth daily. -     furosemide  (LASIX ) 40 MG tablet; Take 1 tablet (40 mg total) by mouth daily.  Mixed hyperlipidemia -     atorvastatin  (LIPITOR) 80 MG tablet; Take 1 tablet (80 mg total) by mouth daily.  ICD (implantable cardioverter-defibrillator) in place  Need for influenza vaccination -     Flu vaccine HIGH DOSE PF(Fluzone Trivalent)   A1C to goal Continue synjardy , trulicity  Continue to keep diabetic diet and regular exercise BP to goal, continue on Entrestro, carvediol, spironolactone , lasix  On lipitor CKD known-recheck cmp Needs eye exam to be scheduled Foot exam UTD Flu shot given today Pneumonia shot UTD Declined covid vaccine    Return in about 3 months (around 06/12/2024) for DM follow up.    Marketta Valadez, PA-C

## 2024-03-13 NOTE — Patient Instructions (Signed)
 Make appt with Dr. Pietro your regular cardiologist.  Need annual eye exam.

## 2024-03-20 ENCOUNTER — Ambulatory Visit

## 2024-03-20 ENCOUNTER — Encounter: Payer: Self-pay | Admitting: Physician Assistant

## 2024-03-20 ENCOUNTER — Telehealth: Payer: Self-pay

## 2024-03-20 NOTE — Telephone Encounter (Signed)
 Spoke with patient.  Advised device monitor is showing disconnected.  Advised to open [employer] and send remote transmission to reconnect.  He stated he will try to do that later this evening or tomorrow.

## 2024-03-21 NOTE — Progress Notes (Signed)
 No ICM remote transmission received for 03/20/2024 and next ICM transmission scheduled for 04/10/2024.

## 2024-03-30 NOTE — Progress Notes (Signed)
 Remote ICD Transmission

## 2024-04-05 ENCOUNTER — Ambulatory Visit: Payer: Medicare Other

## 2024-04-05 VITALS — Ht 70.0 in | Wt 227.0 lb

## 2024-04-05 DIAGNOSIS — Z0000 Encounter for general adult medical examination without abnormal findings: Secondary | ICD-10-CM

## 2024-04-05 NOTE — Patient Instructions (Signed)
 Earl White , Thank you for taking time to come for your Medicare Wellness Visit. I appreciate your ongoing commitment to your health goals. Please review the following plan we discussed and let me know if I can assist you in the future.   These are the goals we discussed:  Goals       Patient Stated (pt-stated)      03/24/2021 AWV Goal: Exercise for General Health  Patient will verbalize understanding of the benefits of increased physical activity: Exercising regularly is important. It will improve your overall fitness, flexibility, and endurance. Regular exercise also will improve your overall health. It can help you control your weight, reduce stress, and improve your bone density. Over the next year, patient will increase physical activity as tolerated with a goal of at least 150 minutes of moderate physical activity per week.  You can tell that you are exercising at a moderate intensity if your heart starts beating faster and you start breathing faster but can still hold a conversation. Moderate-intensity exercise ideas include: Walking 1 mile (1.6 km) in about 15 minutes Biking Hiking Golfing Dancing Water aerobics Patient will verbalize understanding of everyday activities that increase physical activity by providing examples like the following: Yard work, such as: Insurance underwriter Gardening Washing windows or floors Patient will be able to explain general safety guidelines for exercising:  Before you start a new exercise program, talk with your health care provider. Do not exercise so much that you hurt yourself, feel dizzy, or get very short of breath. Wear comfortable clothes and wear shoes with good support. Drink plenty of water while you exercise to prevent dehydration or heat stroke. Work out until your breathing and your heartbeat get faster.       Patient Stated (pt-stated)       03/30/2022 AWV Goal: Improved Nutrition/Diet  Patient will verbalize understanding that diet plays an important role in overall health and that a poor diet is a risk factor for many chronic medical conditions.  Over the next year, patient will improve self management of their diet by incorporating better variety. Patient will utilize available community resources to help with food acquisition if needed (ex: food pantries, [address], etc) Patient will work with nutrition specialist if a referral was made       Patient Stated (pt-stated)      Patient stated that he has started exercising more and he would like to maintain that to be able to get his energy back.      Patient Stated      Patient states he would like to lose weight.         This is a list of the screening recommended for you and due dates:  Health Maintenance  Topic Date Due   Eye exam for diabetics  06/12/2021   COVID-19 Vaccine (7 - Moderna risk 2024-25 season) 02/28/2024   Yearly kidney function blood test for diabetes  08/15/2024   Hemoglobin A1C  09/10/2024   DTaP/Tdap/Td vaccine (2 - Td or Tdap) 11/01/2024   Yearly kidney health urinalysis for diabetes  11/16/2024   Complete foot exam   03/13/2025   Medicare Annual Wellness Visit  04/05/2025   Pneumococcal Vaccine for age over 65  Completed   Flu Shot  Completed   Zoster (Shingles) Vaccine  Completed   Meningitis B Vaccine  Aged Out   Hepatitis C Screening  Discontinued

## 2024-04-05 NOTE — Progress Notes (Signed)
 Subjective:   Earl Vallee Sr. is a 83 y.o. male who presents for Medicare Annual/Subsequent preventive examination.  Visit Complete: Virtual I connected with  Earl Pinal Sr. on 04/05/24 by a audio enabled telemedicine application and verified that I am speaking with the correct person using two identifiers.  Patient Location: Home  Provider Location: Office/Clinic  I discussed the limitations of evaluation and management by telemedicine. The patient expressed understanding and agreed to proceed.  Vital Signs: Because this visit was a virtual/telehealth visit, some criteria may be missing or patient reported. Any vitals not documented were not able to be obtained and vitals that have been documented are patient reported.  Patient Medicare AWV questionnaire was completed by the patient on n/a; I have confirmed that all information answered by patient is correct and no changes since this date.  Cardiac Risk Factors include: advanced age (>48men, >82 women);male gender;hypertension;obesity (BMI >30kg/m2);dyslipidemia;diabetes mellitus     Objective:    Today's Vitals   04/05/24 0900  Weight: 227 lb (103 kg)  Height: 5' 10 (1.778 m)   Body mass index is 32.57 kg/m.     04/05/2024    9:08 AM 04/05/2023    8:57 AM 03/30/2022    8:34 AM 03/24/2021    9:08 AM 10/22/2020   12:13 PM 05/23/2019    2:00 PM 05/17/2018    3:09 PM  Advanced Directives  Does Patient Have a Medical Advance Directive? Yes Yes Yes Yes Yes No Yes   Type of Estate agent of [place];Living will Living will Living will Living will;Healthcare Power of Attorney Living will  Living will  Does patient want to make changes to medical advance directive? No - Patient declined No - Patient declined No - Patient declined No - Patient declined Yes (MAU/Ambulatory/Procedural Areas - Information given)  No - Patient declined   Copy of Healthcare Power of Attorney in Chart?    No - copy requested      Would patient like information on creating a medical advance directive?      Yes (MAU/Ambulatory/Procedural Areas - Information given)      Data saved with a previous flowsheet row definition    Current Medications (verified) Outpatient Encounter Medications as of 04/05/2024  Medication Sig   amiodarone  (PACERONE ) 200 MG tablet Take 1 tablet by mouth once daily   apixaban  (ELIQUIS ) 2.5 MG TABS tablet Take 1 tablet (2.5 mg total) by mouth 2 (two) times daily.   atorvastatin  (LIPITOR) 80 MG tablet Take 1 tablet (80 mg total) by mouth daily.   budesonide -formoterol  (SYMBICORT ) 160-4.5 MCG/ACT inhaler Inhale 2 puffs into the lungs in the morning and at bedtime.   carvedilol  (COREG ) 12.5 MG tablet Take 1 tablet (12.5 mg total) by mouth 2 (two) times daily.   Dulaglutide  (TRULICITY ) 1.5 MG/0.5ML SOAJ INJECT 1.5 MG (0.5ML)  SUBCUTANEOUSLY ONCE A WEEK   furosemide  (LASIX ) 40 MG tablet Take 1 tablet (40 mg total) by mouth daily.   MAGNESIUM -OXIDE 400 (240 Mg) MG tablet TAKE 1 TABLET BY MOUTH ONCE DAILY . APPOINTMENT REQUIRED FOR FUTURE REFILLS   sacubitril -valsartan  (ENTRESTO ) 97-103 MG TAKE 1 TABLET BY MOUTH TWICE DAILY . APPOINTMENT REQUIRED FOR FUTURE REFILLS   sennosides-docusate sodium (SENOKOT-S) 8.6-50 MG tablet Take 1 tablet by mouth daily as needed. For constipation   sildenafil  (VIAGRA ) 100 MG tablet Take 1 tablet (100 mg total) by mouth as needed for erectile dysfunction (for use prior to sexual activity).   spironolactone  (ALDACTONE ) 25 MG tablet  Take 1 tablet (25 mg total) by mouth daily.   SYNJARDY  XR 03-999 MG TB24 Take 1 tablet by mouth once daily   Vitamin D , Ergocalciferol , (DRISDOL ) 1.25 MG (50000 UNIT) CAPS capsule Take 1 capsule (50,000 Units total) by mouth every 7 (seven) days.   albuterol  (VENTOLIN  HFA) 108 (90 Base) MCG/ACT inhaler Inhale 2 puffs into the lungs every 6 (six) hours as needed for wheezing or shortness of breath. (Patient not taking: Reported on 04/05/2024)   No  facility-administered encounter medications on file as of 04/05/2024.    Allergies (verified) Patient has no known allergies.   History: Past Medical History:  Diagnosis Date   BPH (benign prostatic hyperplasia)    CHF (congestive heart failure) (HCC)    Diabetes mellitus (HCC)    Type II. Diet controlled   Family history of breast cancer    Family history of prostate cancer    Gout    Hyperlipidemia    Hypertension    Nonischemic cardiomyopathy (HCC)    a. s/p STJ CRTD   Paroxysmal atrial fibrillation (HCC) 12/25/15   Prostate cancer [facility])    Past Surgical History:  Procedure Laterality Date   BIV ICD GENERATOR CHANGEOUT N/A 10/22/2020   Procedure: BIV ICD GENERATOR CHANGEOUT;  Surgeon: Kelsie Agent, MD;  Location: [facility] INVASIVE CV LAB;  Service: Cardiovascular;  Laterality: N/A;   CARDIAC DEFIBRILLATOR PLACEMENT  Jan 2014   SJM Quadra Assura implanted by Dr Adina Bradley in [place]   Family History  Problem Relation Age of Onset   Diabetes Mother    Hyperlipidemia Mother    Emphysema Father        smoked pipe   Breast cancer Sister 67   Heart attack Brother    Diabetes Brother    Throat cancer Brother 6       [place], ? due to agent orange exposure   Prostate cancer Maternal Uncle    Prostate cancer Cousin        [place] vet, agent orange exposure   Prostate cancer Cousin        dx 28-30 yrs. 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 He has one child and two grandkids. They live close by and visit often. He enjoys motorcycle riding, art, sports and traveling.   Social Drivers of Corporate investment banker Strain: Low Risk  (04/05/2024)   Overall Financial Resource Strain (CARDIA)    Difficulty of Paying Living Expenses: Not hard at all  Food Insecurity: No Food Insecurity (04/05/2024)   Hunger Vital Sign    Worried About Running Out of Food in the Last Year: Never true    Ran Out  of Food in the Last Year: Never true  Transportation Needs: No Transportation Needs (04/05/2024)   PRAPARE - Administrator, Civil Service (Medical): No    Lack of Transportation (Non-Medical): No  Physical Activity: Sufficiently Active (04/05/2024)   Exercise Vital Sign    Days of Exercise per Week: 4 days    Minutes of Exercise per Session: 50 min  Stress: No Stress Concern Present (04/05/2024)   [employer] of Occupational Health - Occupational Stress Questionnaire    Feeling of Stress: Not at all  Social Connections: Moderately Integrated (04/05/2024)   Social Connection and Isolation Panel    Frequency of Communication with Friends and Family: More than three times a week    Frequency of Social Gatherings with Friends and Family: Three times a week    Attends Religious Services: More than 4 times per year    Active Member of Clubs or Organizations: Yes    Attends Banker Meetings: More than 4 times per year    Marital Status: Widowed    Tobacco Counseling Counseling given: Not Answered   Clinical Intake:  Pre-visit preparation completed: Yes  Pain : No/denies pain     BMI - recorded: 32.57 Nutritional Status: BMI > 30  Obese Nutritional Risks: None Diabetes: No  How often do you need to have someone help you when you read instructions, pamphlets, or  other written materials from your doctor or pharmacy?: 1 - Never What is the last grade level you completed in school?: 16  Interpreter Needed?: No      Activities of Daily Living    04/05/2024    9:02 AM  In your present state of health, do you have any difficulty performing the following activities:  Hearing? 0  Vision? 0  Difficulty concentrating or making decisions? 0  Walking or climbing stairs? 1  Dressing or bathing? 0  Doing errands, shopping? 0  Preparing Food and eating ? N  Using the Toilet? N  In the past six months, have you accidently leaked urine? N  Do you have problems with loss of bowel control? N  Managing your Medications? N  Managing your Finances? N  Housekeeping or managing your Housekeeping? N    Patient Care Team: Breeback, Jade L, PA-C as PCP - General (Family Medicine) Pietro Redell RAMAN, MD as PCP - Cardiology (Cardiology) Kennyth Chew, MD as PCP - Electrophysiology (Cardiology) Kathrine Jeoffrey POUR, NP (Inactive) as Nurse Practitioner (Cardiology)  Indicate any recent Medical Services you may have received from other than [facility] providers in the past year (date may be approximate).     Assessment:   This is a routine wellness examination for Earl White.  Hearing/Vision screen No results found.   Goals Addressed             This Visit's Progress    Patient Stated       Patient states he would like to lose weight.        Depression Screen    04/05/2024    9:07 AM 04/05/2023    8:58 AM 02/09/2023    1:42 PM 05/06/2022    2:12 PM 03/30/2022    8:34 AM 03/24/2021    9:09 AM 12/11/2019    2:09 PM  PHQ 2/9 Scores  PHQ - 2 Score 0 0 0 0 0 0 0  PHQ- 9 Score       2    Fall Risk    04/05/2024    9:08  AM 04/05/2023    8:58 AM 02/09/2023    1:42 PM 05/06/2022    2:12 PM 03/30/2022    8:34 AM  Fall Risk   Falls in the past year? 0 0 0 0 0  Number falls in past yr: 0 0 0 0 0  Injury with Fall? 0 0 0 0 0  Risk for fall due to : No Fall Risks  No Fall Risks  No Fall Risks No Fall Risks  Follow up Falls evaluation completed Falls evaluation completed  Falls evaluation completed  Falls evaluation completed      Data saved with a previous flowsheet row definition    MEDICARE RISK AT HOME: Medicare Risk at Home Any stairs in or around the home?: Yes If so, are there any without handrails?: Yes Home free of loose throw rugs in walkways, pet beds, electrical cords, etc?: Yes Adequate lighting in your home to reduce risk of falls?: Yes Life alert?: Yes Use of a cane, walker or w/c?: No Grab bars in the bathroom?: No Shower chair or bench in shower?: No Elevated toilet seat or a handicapped toilet?: No  TIMED UP AND GO:  Was the test performed?  No    Cognitive Function:        04/05/2024    9:09 AM 04/05/2023    9:08 AM 03/30/2022    8:39 AM 03/24/2021    9:13 AM 05/23/2019    2:41 PM  6CIT Screen  What Year? 0 points 0 points 0 points 0 points 0 points  What month? 0 points 0 points 0 points 0 points 0 points  What time? 0 points 0 points 0 points 0 points 0 points  Count back from 20 0 points 0 points 0 points 0 points   Months in reverse 0 points 0 points 0 points 0 points   Repeat phrase 0 points 2 points 2 points 2 points   Total Score 0 points 2 points 2 points 2 points     Immunizations Immunization History  Administered Date(s) Administered   Fluad Quad(high Dose 65+) 05/16/2019, 04/26/2020, 04/24/2021, 05/06/2022   INFLUENZA, HIGH DOSE SEASONAL PF 07/25/2021, 02/02/2023, 03/13/2024   Moderna SARS-COV2 Booster Vaccination 01/20/2021   Moderna Sars-Covid-2 Vaccination 08/26/2019, 09/23/2019, 04/25/2020   PFIZER(Purple Top)SARS-COV-2 Vaccination 02/06/2023   PNEUMOCOCCAL CONJUGATE-20 10/11/2021   Pfizer(Comirnaty)Fall Seasonal Vaccine 12 years and older 05/06/2022, 05/12/2023   Pneumococcal Conjugate-13 08/07/2015   Pneumococcal Polysaccharide-23 08/26/2018   Tdap 11/02/2014   Zoster Recombinant(Shingrix)  08/28/2022, 10/28/2022    TDAP status: Up to date  Flu Vaccine status: Up to date  Pneumococcal vaccine status: Up to date  Covid-19 vaccine status: Information provided on how to obtain vaccines.   Qualifies for Shingles Vaccine? Yes   Zostavax completed No   Shingrix Completed?: Yes  Screening Tests Health Maintenance  Topic Date Due   OPHTHALMOLOGY EXAM  06/12/2021   COVID-19 Vaccine (7 - Moderna risk 2024-25 season) 02/28/2024   Diabetic kidney evaluation - eGFR measurement  08/15/2024   HEMOGLOBIN A1C  09/10/2024   DTaP/Tdap/Td (2 - Td or Tdap) 11/01/2024   Diabetic kidney evaluation - Urine ACR  11/16/2024   FOOT EXAM  03/13/2025   Medicare Annual Wellness (AWV)  04/05/2025   Pneumococcal Vaccine: 50+ Years  Completed   Influenza Vaccine  Completed   Zoster Vaccines- Shingrix  Completed   Meningococcal B Vaccine  Aged Out   Hepatitis C Screening  Discontinued    Health Maintenance  Health Maintenance Due  Topic Date Due   OPHTHALMOLOGY EXAM  06/12/2021   COVID-19 Vaccine (7 - Moderna risk 2024-25 season) 02/28/2024    Colorectal cancer screening: No longer required.   Lung Cancer Screening: (Low Dose CT Chest recommended if Age 56-80 years, 20 pack-year currently smoking OR have quit w/in 15years.) does not qualify.   Lung Cancer Screening Referral: n/a  Additional Screening:  Hepatitis C Screening: does not qualify; Completed 12/13/2019  Vision Screening: Recommended annual ophthalmology exams for early detection of glaucoma and other disorders of the eye. Is the patient up to date with their annual eye exam?  Yes  Who is the provider or what is the name of the office in which the patient attends annual eye exams? [facility] If pt is not established with a provider, would they like to be referred to a provider to establish care? N/a.   Dental Screening: Recommended annual dental exams for proper oral hygiene  Diabetic Foot Exam: Diabetic Foot Exam:  Completed 03/13/2024  Community Resource Referral / Chronic Care Management: CRR required this visit?  No   CCM required this visit?  No     Plan:     I have personally reviewed and noted the following in the patient's chart:   Medical and social history Use of alcohol, tobacco or illicit drugs  Current medications and supplements including opioid prescriptions. Patient is not currently taking opioid prescriptions. Functional ability and status Nutritional status Physical activity Advanced directives List of other physicians Hospitalizations, surgeries, and ER visits in previous 12 months. None Vitals Screenings to include cognitive, depression, and falls Referrals and appointments  In addition, I have reviewed and discussed with patient certain preventive protocols, quality metrics, and best practice recommendations. A written personalized care plan for preventive services as well as general preventive health recommendations were provided to patient.     Earl White, Earl White   04/05/2024   After Visit Summary: (Mail) Due to this being a telephonic visit, the after visit summary with patients personalized plan was offered to patient via mail   Nurse Notes:   Earl Pietila Sr. is a 83 y.o. male patient of Antoniette Vermell CROME, PA-C who had a Medicare Annual Wellness Visit today via telephone. Cecilia is Retired. He has one child. He reports that he is socially active and does interact with friends/family regularly. He is moderately physically active and enjoys motorcycle riding, art, sports and traveling.

## 2024-04-10 ENCOUNTER — Encounter

## 2024-04-12 NOTE — Progress Notes (Signed)
 No ICM remote transmission received for 04/10/2024 and next ICM transmission scheduled for 05/01/2024.

## 2024-04-18 ENCOUNTER — Other Ambulatory Visit: Payer: Self-pay | Admitting: Cardiology

## 2024-04-18 DIAGNOSIS — I472 Ventricular tachycardia, unspecified: Secondary | ICD-10-CM

## 2024-05-01 ENCOUNTER — Ambulatory Visit

## 2024-05-05 ENCOUNTER — Telehealth: Payer: Self-pay

## 2024-05-05 NOTE — Telephone Encounter (Signed)
 Attempted ICM Call to patient to request manual remote transmission.  No answer or voice mail option.  Monitor is showing disconnected.  ICM Remote Transmission rescheduled for 06/12/2024

## 2024-05-10 LAB — OPHTHALMOLOGY REPORT-SCANNED

## 2024-05-17 ENCOUNTER — Ambulatory Visit: Attending: Cardiology

## 2024-05-17 DIAGNOSIS — I472 Ventricular tachycardia, unspecified: Secondary | ICD-10-CM | POA: Diagnosis not present

## 2024-05-18 LAB — CUP PACEART REMOTE DEVICE CHECK
Battery Remaining Longevity: 46 mo
Battery Remaining Percentage: 51 %
Battery Voltage: 2.96 V
Brady Statistic AP VP Percent: 94 %
Brady Statistic AP VS Percent: 6.1 %
Brady Statistic AS VP Percent: 1 %
Brady Statistic AS VS Percent: 1 %
Brady Statistic RA Percent Paced: 99 %
Date Time Interrogation Session: 20251119012258
HighPow Impedance: 42 Ohm
Implantable Lead Connection Status: 753985
Implantable Lead Connection Status: 753985
Implantable Lead Connection Status: 753985
Implantable Lead Implant Date: 20140117
Implantable Lead Implant Date: 20140117
Implantable Lead Implant Date: 20140117
Implantable Lead Location: 753858
Implantable Lead Location: 753859
Implantable Lead Location: 753860
Implantable Pulse Generator Implant Date: 20220426
Lead Channel Impedance Value: 360 Ohm
Lead Channel Impedance Value: 390 Ohm
Lead Channel Impedance Value: 780 Ohm
Lead Channel Pacing Threshold Amplitude: 0.75 V
Lead Channel Pacing Threshold Amplitude: 1 V
Lead Channel Pacing Threshold Amplitude: 1.125 V
Lead Channel Pacing Threshold Pulse Width: 0.5 ms
Lead Channel Pacing Threshold Pulse Width: 0.5 ms
Lead Channel Pacing Threshold Pulse Width: 0.5 ms
Lead Channel Sensing Intrinsic Amplitude: 12 mV
Lead Channel Sensing Intrinsic Amplitude: 4.5 mV
Lead Channel Setting Pacing Amplitude: 1.5 V
Lead Channel Setting Pacing Amplitude: 2 V
Lead Channel Setting Pacing Amplitude: 2.125
Lead Channel Setting Pacing Pulse Width: 0.5 ms
Lead Channel Setting Pacing Pulse Width: 0.5 ms
Lead Channel Setting Sensing Sensitivity: 0.5 mV
Pulse Gen Serial Number: 810027402

## 2024-05-19 NOTE — Progress Notes (Signed)
 Remote ICD Transmission

## 2024-05-22 ENCOUNTER — Ambulatory Visit: Attending: Cardiology

## 2024-05-22 DIAGNOSIS — I5022 Chronic systolic (congestive) heart failure: Secondary | ICD-10-CM | POA: Diagnosis not present

## 2024-05-22 DIAGNOSIS — Z95810 Presence of automatic (implantable) cardiac defibrillator: Secondary | ICD-10-CM | POA: Diagnosis not present

## 2024-05-23 ENCOUNTER — Telehealth: Payer: Self-pay

## 2024-05-23 NOTE — Telephone Encounter (Signed)
 Remote ICM transmission received.  Attempted call to patient regarding ICM remote transmission and no answer.

## 2024-05-23 NOTE — Progress Notes (Signed)
 EPIC Encounter for ICM Monitoring  Patient Name: Earl Gilberg Sr. is a 83 y.o. male Date: 05/23/2024 Primary Care Physican: Antoniette Vermell LITTIE DEVONNA Primary Cardiologist: Pietro Electrophysiologist: Cindie Pore Pacing: 94% 03/26/2022 Weight: 228 lbs 06/10/2022 Office Weight: 238 10/02/2022 Office Weight: 235 lbs 07/07/2023 Weight: 228 lbs 08/11/2023 Weight: 231 lbs 12/23/2023 Office Weight: 232 lbs   AT/AF Burden <1%            Attempted call to patient and unable to reach.   Transmission results reviewed.    CorVue thoracic impedance suggesting intermittent days with possible fluid accumulation within the last month.   Prescribed:   Furosemide  40 mg 1 tablet (40 mg total) daily.    Spironolactone  25 mg take 0.5 tablet (12.5 mg total) daily Eliquis  5 mg take 1 tablet twice a day.   Labs: 08/16/2023 Creatinine 1.70, BUN 30, Potassium 5.4, Sodium 144, GFR 40 05/18/2023 Creatinine 1.40, BUN 23, Potassium 5.1, Sodium 145, GFR 50 02/09/2023 Creatinine 2.14, BUN 42, Potassium 4.7, Sodium 141, GFR 30 A complete set of results can be found in Results Review.   Recommendations:  Unable to reach.     Follow-up plan: ICM clinic phone appointment on 07/03/2024.   91 day device clinic remote transmission 08/16/2024.     EP/Cardiology Office Visits:  Recall 06/20/2024 with Dr Cindie.   Last office visit with Dr Pietro was 05/08/2022 and should have scheduled a 6 month follow up.  Recall 11/04/2022 with Dr Pietro (6 month f/u).       Copy of ICM check sent to Dr. Cindie.      Remote monitoring is medically necessary for Heart Failure Management.    Daily Thoracic Impedance ICM trend: 02/22/2024 through 05/22/2024.    12-14 Month Thoracic Impedance ICM trend:     Mitzie GORMAN Garner, RN 05/23/2024 4:38 PM

## 2024-06-12 ENCOUNTER — Ambulatory Visit: Admitting: Physician Assistant

## 2024-06-12 ENCOUNTER — Ambulatory Visit

## 2024-06-12 VITALS — BP 122/70 | HR 70 | Wt 220.0 lb

## 2024-06-12 DIAGNOSIS — I480 Paroxysmal atrial fibrillation: Secondary | ICD-10-CM | POA: Diagnosis not present

## 2024-06-12 DIAGNOSIS — E782 Mixed hyperlipidemia: Secondary | ICD-10-CM | POA: Diagnosis not present

## 2024-06-12 DIAGNOSIS — Z7985 Long-term (current) use of injectable non-insulin antidiabetic drugs: Secondary | ICD-10-CM | POA: Diagnosis not present

## 2024-06-12 DIAGNOSIS — Z95810 Presence of automatic (implantable) cardiac defibrillator: Secondary | ICD-10-CM | POA: Diagnosis not present

## 2024-06-12 DIAGNOSIS — I10 Essential (primary) hypertension: Secondary | ICD-10-CM

## 2024-06-12 DIAGNOSIS — I420 Dilated cardiomyopathy: Secondary | ICD-10-CM | POA: Diagnosis not present

## 2024-06-12 DIAGNOSIS — E1122 Type 2 diabetes mellitus with diabetic chronic kidney disease: Secondary | ICD-10-CM | POA: Diagnosis not present

## 2024-06-12 DIAGNOSIS — R232 Flushing: Secondary | ICD-10-CM | POA: Diagnosis not present

## 2024-06-12 DIAGNOSIS — N1832 Chronic kidney disease, stage 3b: Secondary | ICD-10-CM | POA: Diagnosis not present

## 2024-06-12 DIAGNOSIS — I5022 Chronic systolic (congestive) heart failure: Secondary | ICD-10-CM | POA: Diagnosis not present

## 2024-06-12 LAB — POCT GLYCOSYLATED HEMOGLOBIN (HGB A1C): Hemoglobin A1C: 6 % — AB (ref 4.0–5.6)

## 2024-06-12 MED ORDER — BLOOD GLUCOSE MONITORING SUPPL DEVI: 1.0000 | Freq: Three times a day (TID) | 0 refills | Status: AC

## 2024-06-12 MED ORDER — SYNJARDY XR 10-1000 MG PO TB24: 1.0000 | ORAL_TABLET | Freq: Every day | ORAL | 1 refills | Status: AC

## 2024-06-12 MED ORDER — BLOOD GLUCOSE TEST VI STRP: 1.0000 | ORAL_STRIP | Freq: Three times a day (TID) | 1 refills | Status: AC

## 2024-06-12 MED ORDER — LANCETS MISC: 1.0000 | 0 refills | Status: AC

## 2024-06-12 MED ORDER — TRULICITY 1.5 MG/0.5ML ~~LOC~~ SOAJ: SUBCUTANEOUS | 1 refills | Status: AC

## 2024-06-12 MED ORDER — LANCET DEVICE MISC: 1.0000 | Freq: Three times a day (TID) | 0 refills | Status: AC

## 2024-06-12 NOTE — Progress Notes (Unsigned)
 Established Patient Office Visit  Subjective   Patient ID: Earl Bieler Sr., male    DOB: 08-01-1940  Age: 83 y.o. MRN: [id number]  Chief Complaint  Patient presents with   Medical Management of Chronic Issues    HPI .SABRADiscussed the use of AI scribe software for clinical note transcription with the patient, who gave verbal consent to proceed.  History of Present Illness Earl Detienne Sr. is an 83 year old male who presents to the clinic for 3 month follow up.   Postprandial hot flashes - Intermittent episodes occurring approximately three to four times per week - Typically triggered after eating - Each episode lasts about three to five minutes - Described as similar to 'menopause flashes' - Particularly concerned due to age and gender  Recent medication and health changes - No recent changes in medication regimen - No new medications started - No recent significant events  Glycemic status and dietary habits - Not monitoring blood glucose at home due to lack of glucometer - A1c level is 6.0 - Does not consume sugar directly but drinks juice regularly - Patient is active.  - no LE swelling.   Weight changes - Current weight is 220 pounds - Slight decrease from 225 pounds in the spring  PAF - on carvediol and eliquis  - no palpitations recently  Associated symptoms - No chest pain or palpitations  - No other significant symptoms reported    ROS See HPI.    Objective:     BP 122/70 (Cuff Size: Normal)   Pulse 70   Wt 220 lb (99.8 kg)   SpO2 99%   BMI 31.57 kg/m  BP Readings from Last 3 Encounters:  06/12/24 122/70  03/13/24 130/70  12/23/23 (!) 96/56   Wt Readings from Last 3 Encounters:  06/12/24 220 lb (99.8 kg)  04/05/24 227 lb (103 kg)  03/13/24 225 lb (102.1 kg)    .SABRA Results for orders placed or performed in visit on 06/12/24  POCT HgB A1C   Collection Time: 06/12/24  1:35 PM  Result Value Ref Range   Hemoglobin A1C 6.0 (A) 4.0 - 5.6 %    HbA1c POC (<> result, manual entry)     HbA1c, POC (prediabetic range)     HbA1c, POC (controlled diabetic range)       Physical Exam Constitutional:      Appearance: Normal appearance. He is obese.  HENT:     Head: Normocephalic.  Cardiovascular:     Rate and Rhythm: Normal rate and regular rhythm.     Heart sounds: Murmur heard.  Pulmonary:     Effort: Pulmonary effort is normal.     Breath sounds: Normal breath sounds.  Musculoskeletal:     Cervical back: Normal range of motion and neck supple. No tenderness.     Right lower leg: No edema.     Left lower leg: No edema.  Lymphadenopathy:     Cervical: No cervical adenopathy.  Neurological:     General: No focal deficit present.     Mental Status: He is alert and oriented to person, place, and time.  Psychiatric:        Mood and Affect: Mood normal.        Assessment & Plan:  SABRASABRADemone was seen today for medical management of chronic issues.  Diagnoses and all orders for this visit:  Type 2 diabetes mellitus with stage 3b chronic kidney disease, 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HCC) -  POCT HgB A1C -     Dulaglutide  (TRULICITY ) 1.5 MG/0.5ML SOAJ; INJECT 1.5 MG (0.5ML)  SUBCUTANEOUSLY ONCE A WEEK -     Empagliflozin -metFORMIN  HCl ER (SYNJARDY  XR) 03-999 MG TB24; Take 1 tablet by mouth daily. -     Blood Glucose Monitoring Suppl DEVI; 1 each by Does not apply route in the morning, at noon, and at bedtime. May substitute to any manufacturer covered by patient's insurance. -     Glucose Blood (BLOOD GLUCOSE TEST STRIPS) STRP; 1 each by In Vitro route in the morning, at noon, and at bedtime. May substitute to any manufacturer covered by patient's insurance. -     Lancet Device MISC; 1 each by Does not apply route in the morning, at noon, and at bedtime. May substitute to any manufacturer covered by patient's insurance. -     Lancets MISC; 1 each by Does not apply route as directed. Dispense based on patient  and insurance preference. Use up to four times daily as directed. (FOR ICD-10 E10.9, E11.9).  Congestive dilated cardiomyopathy (HCC)  Essential hypertension, benign  Mixed hyperlipidemia  Hot flashes -     Blood Glucose Monitoring Suppl DEVI; 1 each by Does not apply route in the morning, at noon, and at bedtime. May substitute to any manufacturer covered by patient's insurance. -     Glucose Blood (BLOOD GLUCOSE TEST STRIPS) STRP; 1 each by In Vitro route in the morning, at noon, and at bedtime. May substitute to any manufacturer covered by patient's insurance. -     Lancet Device MISC; 1 each by Does not apply route in the morning, at noon, and at bedtime. May substitute to any manufacturer covered by patient's insurance. -     Lancets MISC; 1 each by Does not apply route as directed. Dispense based on patient and insurance preference. Use up to four times daily as directed. (FOR ICD-10 E10.9, E11.9).  ICD (implantable cardioverter-defibrillator) in place  Paroxysmal atrial fibrillation (HCC)   Assessment & Plan Type 2 diabetes mellitus with stage 3b chronic kidney disease Intermittent hot flashes possibly related to postprandial blood glucose fluctuations. A1c at 6.0 indicates good glycemic control. - Prescribed glucometer for home blood glucose monitoring. - Advised to log blood glucose readings, especially postprandial. - Recommended smaller, more frequent meals to stabilize blood glucose. - Educated on normal blood glucose ranges: fasting around 100 mg/dL, postprandial should not exceed 200 mg/dL. - normal microalbumin - on STATIN - BP to goal and on SGLT-2 for kidney protectant - eye and foot exam UTD. - Flu shot UTD, declined covid vaccine.   Essential hypertension Blood pressure well-controlled at 122/68 mmHg. No recent hypertensive events. - Advised to monitor blood pressure during hot flashes to assess correlation with blood pressure changes. - Continue same  medications.  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 Condition well-managed with current medication regimen. - Continue current medication regimen. - Continue to follow up with cardiology every 6 months.   PAF - controlled on carvediol  - on eliquis  for blood clot prevent - continue follow up with cardiology every 6 months    Return in about 3 months (around 09/10/2024).    Maor Meckel, PA-C

## 2024-06-12 NOTE — Patient Instructions (Signed)
 Get glucometer and start checking sugars and BP after eating.

## 2024-06-13 ENCOUNTER — Encounter: Payer: Self-pay | Admitting: Physician Assistant

## 2024-06-16 ENCOUNTER — Ambulatory Visit: Payer: Self-pay | Admitting: Cardiology

## 2024-06-28 ENCOUNTER — Other Ambulatory Visit: Payer: Self-pay | Admitting: Cardiology

## 2024-06-28 DIAGNOSIS — I420 Dilated cardiomyopathy: Secondary | ICD-10-CM

## 2024-06-28 DIAGNOSIS — I480 Paroxysmal atrial fibrillation: Secondary | ICD-10-CM

## 2024-07-03 ENCOUNTER — Ambulatory Visit: Attending: Cardiology

## 2024-07-03 DIAGNOSIS — Z95810 Presence of automatic (implantable) cardiac defibrillator: Secondary | ICD-10-CM

## 2024-07-03 DIAGNOSIS — I5022 Chronic systolic (congestive) heart failure: Secondary | ICD-10-CM

## 2024-07-05 NOTE — Progress Notes (Signed)
 EPIC Encounter for ICM Monitoring  Patient Name: Earl Garron Sr. is a 84 y.o. male Date: 07/05/2024 Primary Care Physican: Antoniette Vermell LITTIE DEVONNA Primary Cardiologist: Pietro Electrophysiologist: Kennyth Pore Pacing: 95% 03/26/2022 Weight: 228 lbs 06/10/2022 Office Weight: 238 10/02/2022 Office Weight: 235 lbs 07/07/2023 Weight: 228 lbs 08/11/2023 Weight: 231 lbs 12/23/2023 Office Weight: 232 lbs   AT/AF Burden <1%            Transmission results reviewed.    CorVue thoracic impedance suggesting intermittent days with possible fluid accumulation within the last month.   Prescribed:   Furosemide  40 mg 1 tablet (40 mg total) daily.    Spironolactone  25 mg take 0.5 tablet (12.5 mg total) daily Eliquis  5 mg take 1 tablet twice a day.   Labs: 08/16/2023 Creatinine 1.70, BUN 30, Potassium 5.4, Sodium 144, GFR 40 05/18/2023 Creatinine 1.40, BUN 23, Potassium 5.1, Sodium 145, GFR 50 02/09/2023 Creatinine 2.14, BUN 42, Potassium 4.7, Sodium 141, GFR 30 A complete set of results can be found in Results Review.   Recommendations:  No changes.    Follow-up plan: ICM clinic phone appointment on 08/03/2024.   91 day device clinic remote transmission 08/16/2024.     EP/Cardiology Office Visits:  Recall 06/20/2024 with Dr Cindie.    Recall 11/04/2022 with Dr Pietro (6 month f/u).       Copy of ICM check sent to Dr. Kennyth.      Remote monitoring is medically necessary for Heart Failure Management.    Daily Thoracic Impedance ICM trend: 04/04/2024 through 07/03/2024.    12-14 Month Thoracic Impedance ICM trend:     Mitzie GORMAN Garner, RN 07/05/2024 12:34 PM

## 2024-07-06 ENCOUNTER — Telehealth: Payer: Self-pay | Admitting: Cardiology

## 2024-07-06 ENCOUNTER — Other Ambulatory Visit: Payer: Self-pay | Admitting: Cardiology

## 2024-07-06 DIAGNOSIS — I420 Dilated cardiomyopathy: Secondary | ICD-10-CM

## 2024-07-06 NOTE — Telephone Encounter (Unsigned)
 Copied from CRM [id number]. Topic: Clinical - Medication Question >> Jul 06, 2024  2:44 PM Myrick T wrote: Reason for CRM: patient said he normally get 60 tablets of the ENTRESTO  97-103 MG and he only recvd a 30 day supply today. Please f/u with patient

## 2024-07-10 NOTE — Telephone Encounter (Signed)
 Provided the patient with Dr. Janise contact information. It looks like it was refilled by their office 07/06/24 but pt needs an appointment for further refills.

## 2024-07-17 ENCOUNTER — Telehealth: Payer: Self-pay | Admitting: Cardiology

## 2024-07-17 DIAGNOSIS — I420 Dilated cardiomyopathy: Secondary | ICD-10-CM

## 2024-07-17 NOTE — Telephone Encounter (Signed)
" °*  STAT* If patient is at the pharmacy, call can be transferred to refill team.   1. Which medications need to be refilled? (please list name of each medication and dose if known)   ENTRESTO  97-103 MG     2. Would you like to learn more about the convenience, safety, & potential cost savings by using the [facility]?      3. Are you open to using the [facility] (Type [facility].    4. Which pharmacy/location (including street and city if local pharmacy) is medication to be sent to?  [id number] - [place], [place] - [address]     5. Do they need a 30 day or 90 day supply? Refill until appt  10/24/24 "

## 2024-07-18 ENCOUNTER — Other Ambulatory Visit: Payer: Self-pay | Admitting: Cardiology

## 2024-07-18 DIAGNOSIS — I420 Dilated cardiomyopathy: Secondary | ICD-10-CM

## 2024-07-20 ENCOUNTER — Other Ambulatory Visit (HOSPITAL_COMMUNITY): Payer: Self-pay

## 2024-07-20 ENCOUNTER — Telehealth: Payer: Self-pay | Admitting: Pharmacy Technician

## 2024-07-20 DIAGNOSIS — I420 Dilated cardiomyopathy: Secondary | ICD-10-CM

## 2024-07-20 MED ORDER — ENTRESTO 97-103 MG PO TABS: 1.0000 | ORAL_TABLET | Freq: Two times a day (BID) | ORAL | 0 refills | Status: DC

## 2024-07-20 NOTE — Telephone Encounter (Signed)
 Pt scheduled to see Dr. Pietro 10/24/24, refill sent.

## 2024-07-20 NOTE — Telephone Encounter (Addendum)
" ° °  Pharmacy Patient Advocate Encounter   Received notification from [facility] KEY that prior authorization for ENTRESTO  is required/requested.   Insurance verification completed.   The patient is insured through [facility].   Per PA     Patient filled brand entresto  07/19/24 15 d/s "

## 2024-07-25 ENCOUNTER — Other Ambulatory Visit: Payer: Self-pay | Admitting: Physician Assistant

## 2024-07-25 DIAGNOSIS — E782 Mixed hyperlipidemia: Secondary | ICD-10-CM

## 2024-07-27 NOTE — Telephone Encounter (Signed)
" °*  STAT* If patient is at the pharmacy, call can be transferred to refill team.   1. Which medications need to be refilled? (please list name of each medication and dose if known)   ENTRESTO  97-103 MG   2. Would you like to learn more about the convenience, safety, & potential cost savings by using the [facility]?   3. Are you open to using the [facility] (Type [facility]. ).  4. Which pharmacy/location (including street and city if local pharmacy) is medication to be sent to?  [facility] - [place], [place] - [address]   5. Do they need a 30 day or 90 day supply?   Patient stated he has 2 days left of this medication.  Patient has appointment scheduled with Dr. Pietro on 4/28. "

## 2024-07-27 NOTE — Progress Notes (Signed)
 31 day ICM Remote transmission canceled due to [facility] clinic is on hold until further notice.  91 day remote monitoring will continue per protocol.

## 2024-07-27 NOTE — Telephone Encounter (Signed)
 Hi, insurance is asking for the patient to be changed to the generic entresto . The entresto  is currently written as brand. Can he be changed to generic? If yes, can the generic please be sent in to his pharmacy? Thank you!

## 2024-07-28 MED ORDER — SACUBITRIL-VALSARTAN 97-103 MG PO TABS: 1.0000 | ORAL_TABLET | Freq: Two times a day (BID) | ORAL | 0 refills | Status: AC

## 2024-07-28 NOTE — Addendum Note (Signed)
 Addended by: MEMORY DELON POUR on: 07/28/2024 07:34 AM   Modules accepted: Orders

## 2024-07-28 NOTE — Telephone Encounter (Signed)
 Per Dr. Pietro, ok for generic.  Refill sent.

## 2024-07-29 ENCOUNTER — Other Ambulatory Visit: Payer: Self-pay | Admitting: Cardiology

## 2024-07-29 ENCOUNTER — Other Ambulatory Visit: Payer: Self-pay | Admitting: Physician Assistant

## 2024-07-29 DIAGNOSIS — E782 Mixed hyperlipidemia: Secondary | ICD-10-CM

## 2024-07-29 DIAGNOSIS — I480 Paroxysmal atrial fibrillation: Secondary | ICD-10-CM

## 2024-08-03 ENCOUNTER — Ambulatory Visit

## 2024-08-16 ENCOUNTER — Ambulatory Visit

## 2024-09-11 ENCOUNTER — Ambulatory Visit: Admitting: Physician Assistant

## 2024-10-24 ENCOUNTER — Ambulatory Visit: Admitting: Cardiology

## 2024-11-15 ENCOUNTER — Ambulatory Visit

## 2025-02-14 ENCOUNTER — Ambulatory Visit

## 2025-04-10 ENCOUNTER — Ambulatory Visit

## 2025-05-16 ENCOUNTER — Ambulatory Visit
